# Patient Record
Sex: Female | Born: 1945 | Race: White | Hispanic: No | Marital: Married | Smoking: Never smoker
Health system: Southern US, Community
[De-identification: ages and names within clinical notes are randomized; demographics above are authoritative.]

## PROBLEM LIST (undated history)

## (undated) DIAGNOSIS — Z9889 Other specified postprocedural states: Secondary | ICD-10-CM

## (undated) DIAGNOSIS — D72829 Elevated white blood cell count, unspecified: Secondary | ICD-10-CM

## (undated) DIAGNOSIS — M797 Fibromyalgia: Secondary | ICD-10-CM

## (undated) DIAGNOSIS — I10 Essential (primary) hypertension: Secondary | ICD-10-CM

## (undated) DIAGNOSIS — E7800 Pure hypercholesterolemia, unspecified: Secondary | ICD-10-CM

## (undated) DIAGNOSIS — K219 Gastro-esophageal reflux disease without esophagitis: Secondary | ICD-10-CM

## (undated) DIAGNOSIS — E119 Type 2 diabetes mellitus without complications: Secondary | ICD-10-CM

## (undated) DIAGNOSIS — I639 Cerebral infarction, unspecified: Secondary | ICD-10-CM

## (undated) DIAGNOSIS — E669 Obesity, unspecified: Secondary | ICD-10-CM

## (undated) DIAGNOSIS — R112 Nausea with vomiting, unspecified: Secondary | ICD-10-CM

## (undated) HISTORY — PX: APPENDECTOMY: SHX54

## (undated) HISTORY — DX: Obesity, unspecified: E66.9

## (undated) HISTORY — DX: Cerebral infarction, unspecified: I63.9

## (undated) HISTORY — DX: Essential (primary) hypertension: I10

## (undated) HISTORY — PX: CHOLECYSTECTOMY: SHX55

## (undated) HISTORY — DX: Other specified postprocedural states: Z98.890

## (undated) HISTORY — DX: Pure hypercholesterolemia, unspecified: E78.00

## (undated) HISTORY — PX: BREAST CYST ASPIRATION: SHX578

## (undated) HISTORY — PX: DILATION AND CURETTAGE OF UTERUS: SHX78

## (undated) HISTORY — DX: Type 2 diabetes mellitus without complications: E11.9

## (undated) HISTORY — DX: Other specified postprocedural states: R11.2

## (undated) HISTORY — PX: ABDOMINAL HYSTERECTOMY: SHX81

## (undated) HISTORY — DX: Gastro-esophageal reflux disease without esophagitis: K21.9

## (undated) HISTORY — PX: OTHER SURGICAL HISTORY: SHX169

## (undated) HISTORY — PX: HERNIA REPAIR: SHX51

## (undated) HISTORY — DX: Elevated white blood cell count, unspecified: D72.829

## (undated) HISTORY — DX: Fibromyalgia: M79.7

---

## 2013-09-10 ENCOUNTER — Encounter (INDEPENDENT_AMBULATORY_CARE_PROVIDER_SITE_OTHER): Payer: Commercial Managed Care - HMO | Admitting: Ophthalmology

## 2013-09-10 DIAGNOSIS — E1165 Type 2 diabetes mellitus with hyperglycemia: Secondary | ICD-10-CM

## 2013-09-10 DIAGNOSIS — I10 Essential (primary) hypertension: Secondary | ICD-10-CM

## 2013-09-10 DIAGNOSIS — E11319 Type 2 diabetes mellitus with unspecified diabetic retinopathy without macular edema: Secondary | ICD-10-CM

## 2013-09-10 DIAGNOSIS — H43819 Vitreous degeneration, unspecified eye: Secondary | ICD-10-CM

## 2013-09-10 DIAGNOSIS — E1139 Type 2 diabetes mellitus with other diabetic ophthalmic complication: Secondary | ICD-10-CM

## 2013-09-10 DIAGNOSIS — H35039 Hypertensive retinopathy, unspecified eye: Secondary | ICD-10-CM

## 2013-09-10 DIAGNOSIS — H2510 Age-related nuclear cataract, unspecified eye: Secondary | ICD-10-CM

## 2013-11-11 HISTORY — PX: COLONOSCOPY: SHX174

## 2014-02-26 DIAGNOSIS — E1169 Type 2 diabetes mellitus with other specified complication: Secondary | ICD-10-CM | POA: Diagnosis not present

## 2014-02-26 DIAGNOSIS — I10 Essential (primary) hypertension: Secondary | ICD-10-CM | POA: Diagnosis not present

## 2014-02-26 DIAGNOSIS — E1322 Other specified diabetes mellitus with diabetic chronic kidney disease: Secondary | ICD-10-CM | POA: Diagnosis not present

## 2014-03-05 DIAGNOSIS — I10 Essential (primary) hypertension: Secondary | ICD-10-CM | POA: Diagnosis not present

## 2014-03-05 DIAGNOSIS — N181 Chronic kidney disease, stage 1: Secondary | ICD-10-CM | POA: Diagnosis not present

## 2014-03-05 DIAGNOSIS — E1169 Type 2 diabetes mellitus with other specified complication: Secondary | ICD-10-CM | POA: Diagnosis not present

## 2014-03-05 DIAGNOSIS — E1122 Type 2 diabetes mellitus with diabetic chronic kidney disease: Secondary | ICD-10-CM | POA: Diagnosis not present

## 2014-04-15 DIAGNOSIS — E119 Type 2 diabetes mellitus without complications: Secondary | ICD-10-CM | POA: Diagnosis not present

## 2014-04-15 DIAGNOSIS — H52209 Unspecified astigmatism, unspecified eye: Secondary | ICD-10-CM | POA: Diagnosis not present

## 2014-04-15 DIAGNOSIS — H5210 Myopia, unspecified eye: Secondary | ICD-10-CM | POA: Diagnosis not present

## 2014-04-15 DIAGNOSIS — H5213 Myopia, bilateral: Secondary | ICD-10-CM | POA: Diagnosis not present

## 2014-04-15 DIAGNOSIS — H524 Presbyopia: Secondary | ICD-10-CM | POA: Diagnosis not present

## 2014-04-15 DIAGNOSIS — Z0100 Encounter for examination of eyes and vision without abnormal findings: Secondary | ICD-10-CM | POA: Diagnosis not present

## 2014-05-05 DIAGNOSIS — Z6841 Body Mass Index (BMI) 40.0 and over, adult: Secondary | ICD-10-CM | POA: Diagnosis not present

## 2014-05-05 DIAGNOSIS — R2232 Localized swelling, mass and lump, left upper limb: Secondary | ICD-10-CM | POA: Diagnosis not present

## 2014-05-05 DIAGNOSIS — M797 Fibromyalgia: Secondary | ICD-10-CM | POA: Diagnosis not present

## 2014-05-14 DIAGNOSIS — R2232 Localized swelling, mass and lump, left upper limb: Secondary | ICD-10-CM | POA: Diagnosis not present

## 2014-05-31 DIAGNOSIS — R2232 Localized swelling, mass and lump, left upper limb: Secondary | ICD-10-CM | POA: Diagnosis not present

## 2014-05-31 DIAGNOSIS — Z6841 Body Mass Index (BMI) 40.0 and over, adult: Secondary | ICD-10-CM | POA: Diagnosis not present

## 2014-07-01 DIAGNOSIS — I10 Essential (primary) hypertension: Secondary | ICD-10-CM | POA: Diagnosis not present

## 2014-07-01 DIAGNOSIS — N181 Chronic kidney disease, stage 1: Secondary | ICD-10-CM | POA: Diagnosis not present

## 2014-07-01 DIAGNOSIS — E1122 Type 2 diabetes mellitus with diabetic chronic kidney disease: Secondary | ICD-10-CM | POA: Diagnosis not present

## 2014-07-01 DIAGNOSIS — E1169 Type 2 diabetes mellitus with other specified complication: Secondary | ICD-10-CM | POA: Diagnosis not present

## 2014-07-08 DIAGNOSIS — N181 Chronic kidney disease, stage 1: Secondary | ICD-10-CM | POA: Diagnosis not present

## 2014-07-08 DIAGNOSIS — E1169 Type 2 diabetes mellitus with other specified complication: Secondary | ICD-10-CM | POA: Diagnosis not present

## 2014-07-08 DIAGNOSIS — E782 Mixed hyperlipidemia: Secondary | ICD-10-CM | POA: Diagnosis not present

## 2014-07-08 DIAGNOSIS — E1122 Type 2 diabetes mellitus with diabetic chronic kidney disease: Secondary | ICD-10-CM | POA: Diagnosis not present

## 2014-07-16 HISTORY — PX: GALLBLADDER SURGERY: SHX652

## 2014-08-06 DIAGNOSIS — E1122 Type 2 diabetes mellitus with diabetic chronic kidney disease: Secondary | ICD-10-CM | POA: Diagnosis not present

## 2014-08-06 DIAGNOSIS — N181 Chronic kidney disease, stage 1: Secondary | ICD-10-CM | POA: Diagnosis not present

## 2014-08-06 DIAGNOSIS — Z6841 Body Mass Index (BMI) 40.0 and over, adult: Secondary | ICD-10-CM | POA: Diagnosis not present

## 2014-09-22 DIAGNOSIS — N181 Chronic kidney disease, stage 1: Secondary | ICD-10-CM | POA: Diagnosis not present

## 2014-09-22 DIAGNOSIS — E1122 Type 2 diabetes mellitus with diabetic chronic kidney disease: Secondary | ICD-10-CM | POA: Diagnosis not present

## 2014-09-22 DIAGNOSIS — Z6841 Body Mass Index (BMI) 40.0 and over, adult: Secondary | ICD-10-CM | POA: Diagnosis not present

## 2014-10-07 DIAGNOSIS — N181 Chronic kidney disease, stage 1: Secondary | ICD-10-CM | POA: Diagnosis not present

## 2014-10-07 DIAGNOSIS — E1122 Type 2 diabetes mellitus with diabetic chronic kidney disease: Secondary | ICD-10-CM | POA: Diagnosis not present

## 2014-10-07 DIAGNOSIS — E1169 Type 2 diabetes mellitus with other specified complication: Secondary | ICD-10-CM | POA: Diagnosis not present

## 2014-10-07 DIAGNOSIS — E1159 Type 2 diabetes mellitus with other circulatory complications: Secondary | ICD-10-CM | POA: Diagnosis not present

## 2014-10-18 DIAGNOSIS — E1122 Type 2 diabetes mellitus with diabetic chronic kidney disease: Secondary | ICD-10-CM | POA: Diagnosis not present

## 2014-10-18 DIAGNOSIS — N181 Chronic kidney disease, stage 1: Secondary | ICD-10-CM | POA: Diagnosis not present

## 2014-10-18 DIAGNOSIS — E1140 Type 2 diabetes mellitus with diabetic neuropathy, unspecified: Secondary | ICD-10-CM | POA: Diagnosis not present

## 2014-10-18 DIAGNOSIS — E1159 Type 2 diabetes mellitus with other circulatory complications: Secondary | ICD-10-CM | POA: Diagnosis not present

## 2014-10-18 DIAGNOSIS — Z23 Encounter for immunization: Secondary | ICD-10-CM | POA: Diagnosis not present

## 2014-11-04 DIAGNOSIS — Z0000 Encounter for general adult medical examination without abnormal findings: Secondary | ICD-10-CM | POA: Diagnosis not present

## 2014-11-04 DIAGNOSIS — G8929 Other chronic pain: Secondary | ICD-10-CM | POA: Diagnosis not present

## 2014-11-04 DIAGNOSIS — Z1389 Encounter for screening for other disorder: Secondary | ICD-10-CM | POA: Diagnosis not present

## 2014-11-04 DIAGNOSIS — Z6841 Body Mass Index (BMI) 40.0 and over, adult: Secondary | ICD-10-CM | POA: Diagnosis not present

## 2014-11-04 DIAGNOSIS — Z139 Encounter for screening, unspecified: Secondary | ICD-10-CM | POA: Diagnosis not present

## 2014-11-04 DIAGNOSIS — R1011 Right upper quadrant pain: Secondary | ICD-10-CM | POA: Diagnosis not present

## 2014-11-10 DIAGNOSIS — K8080 Other cholelithiasis without obstruction: Secondary | ICD-10-CM | POA: Diagnosis not present

## 2014-11-10 DIAGNOSIS — R1011 Right upper quadrant pain: Secondary | ICD-10-CM | POA: Diagnosis not present

## 2014-11-10 DIAGNOSIS — K8020 Calculus of gallbladder without cholecystitis without obstruction: Secondary | ICD-10-CM | POA: Diagnosis not present

## 2014-11-11 DIAGNOSIS — Z6841 Body Mass Index (BMI) 40.0 and over, adult: Secondary | ICD-10-CM | POA: Diagnosis not present

## 2014-11-11 DIAGNOSIS — K8020 Calculus of gallbladder without cholecystitis without obstruction: Secondary | ICD-10-CM | POA: Diagnosis not present

## 2014-11-11 DIAGNOSIS — G8929 Other chronic pain: Secondary | ICD-10-CM | POA: Diagnosis not present

## 2014-11-11 DIAGNOSIS — R1011 Right upper quadrant pain: Secondary | ICD-10-CM | POA: Diagnosis not present

## 2014-11-23 DIAGNOSIS — R1011 Right upper quadrant pain: Secondary | ICD-10-CM | POA: Diagnosis not present

## 2014-11-23 DIAGNOSIS — K8010 Calculus of gallbladder with chronic cholecystitis without obstruction: Secondary | ICD-10-CM | POA: Diagnosis not present

## 2014-11-23 DIAGNOSIS — R1013 Epigastric pain: Secondary | ICD-10-CM | POA: Diagnosis not present

## 2014-11-23 DIAGNOSIS — R110 Nausea: Secondary | ICD-10-CM | POA: Diagnosis not present

## 2014-12-03 DIAGNOSIS — Z01810 Encounter for preprocedural cardiovascular examination: Secondary | ICD-10-CM | POA: Diagnosis not present

## 2014-12-03 DIAGNOSIS — Z01818 Encounter for other preprocedural examination: Secondary | ICD-10-CM | POA: Diagnosis not present

## 2014-12-06 DIAGNOSIS — Z7984 Long term (current) use of oral hypoglycemic drugs: Secondary | ICD-10-CM | POA: Diagnosis not present

## 2014-12-06 DIAGNOSIS — I10 Essential (primary) hypertension: Secondary | ICD-10-CM | POA: Diagnosis not present

## 2014-12-06 DIAGNOSIS — E785 Hyperlipidemia, unspecified: Secondary | ICD-10-CM | POA: Diagnosis not present

## 2014-12-06 DIAGNOSIS — K219 Gastro-esophageal reflux disease without esophagitis: Secondary | ICD-10-CM | POA: Diagnosis not present

## 2014-12-06 DIAGNOSIS — Z8673 Personal history of transient ischemic attack (TIA), and cerebral infarction without residual deficits: Secondary | ICD-10-CM | POA: Diagnosis not present

## 2014-12-06 DIAGNOSIS — Z9049 Acquired absence of other specified parts of digestive tract: Secondary | ICD-10-CM | POA: Diagnosis not present

## 2014-12-06 DIAGNOSIS — J45909 Unspecified asthma, uncomplicated: Secondary | ICD-10-CM | POA: Diagnosis not present

## 2014-12-06 DIAGNOSIS — E1140 Type 2 diabetes mellitus with diabetic neuropathy, unspecified: Secondary | ICD-10-CM | POA: Diagnosis not present

## 2014-12-06 DIAGNOSIS — K8020 Calculus of gallbladder without cholecystitis without obstruction: Secondary | ICD-10-CM | POA: Diagnosis not present

## 2014-12-06 DIAGNOSIS — K8010 Calculus of gallbladder with chronic cholecystitis without obstruction: Secondary | ICD-10-CM | POA: Diagnosis not present

## 2015-02-17 DIAGNOSIS — E1122 Type 2 diabetes mellitus with diabetic chronic kidney disease: Secondary | ICD-10-CM | POA: Diagnosis not present

## 2015-02-17 DIAGNOSIS — E1159 Type 2 diabetes mellitus with other circulatory complications: Secondary | ICD-10-CM | POA: Diagnosis not present

## 2015-02-17 DIAGNOSIS — N181 Chronic kidney disease, stage 1: Secondary | ICD-10-CM | POA: Diagnosis not present

## 2015-02-17 DIAGNOSIS — E1169 Type 2 diabetes mellitus with other specified complication: Secondary | ICD-10-CM | POA: Diagnosis not present

## 2015-02-24 DIAGNOSIS — E1159 Type 2 diabetes mellitus with other circulatory complications: Secondary | ICD-10-CM | POA: Diagnosis not present

## 2015-02-24 DIAGNOSIS — E1122 Type 2 diabetes mellitus with diabetic chronic kidney disease: Secondary | ICD-10-CM | POA: Diagnosis not present

## 2015-02-24 DIAGNOSIS — N181 Chronic kidney disease, stage 1: Secondary | ICD-10-CM | POA: Diagnosis not present

## 2015-02-24 DIAGNOSIS — I10 Essential (primary) hypertension: Secondary | ICD-10-CM | POA: Diagnosis not present

## 2015-06-29 DIAGNOSIS — E1159 Type 2 diabetes mellitus with other circulatory complications: Secondary | ICD-10-CM | POA: Diagnosis not present

## 2015-06-29 DIAGNOSIS — N181 Chronic kidney disease, stage 1: Secondary | ICD-10-CM | POA: Diagnosis not present

## 2015-06-29 DIAGNOSIS — E1169 Type 2 diabetes mellitus with other specified complication: Secondary | ICD-10-CM | POA: Diagnosis not present

## 2015-06-29 DIAGNOSIS — E1122 Type 2 diabetes mellitus with diabetic chronic kidney disease: Secondary | ICD-10-CM | POA: Diagnosis not present

## 2015-07-07 DIAGNOSIS — E1122 Type 2 diabetes mellitus with diabetic chronic kidney disease: Secondary | ICD-10-CM | POA: Diagnosis not present

## 2015-07-07 DIAGNOSIS — N181 Chronic kidney disease, stage 1: Secondary | ICD-10-CM | POA: Diagnosis not present

## 2015-07-07 DIAGNOSIS — I10 Essential (primary) hypertension: Secondary | ICD-10-CM | POA: Diagnosis not present

## 2015-07-07 DIAGNOSIS — E1159 Type 2 diabetes mellitus with other circulatory complications: Secondary | ICD-10-CM | POA: Diagnosis not present

## 2015-07-08 DIAGNOSIS — Z1211 Encounter for screening for malignant neoplasm of colon: Secondary | ICD-10-CM | POA: Diagnosis not present

## 2015-07-08 DIAGNOSIS — Z0000 Encounter for general adult medical examination without abnormal findings: Secondary | ICD-10-CM | POA: Diagnosis not present

## 2015-07-08 DIAGNOSIS — Z01419 Encounter for gynecological examination (general) (routine) without abnormal findings: Secondary | ICD-10-CM | POA: Diagnosis not present

## 2015-07-08 DIAGNOSIS — N814 Uterovaginal prolapse, unspecified: Secondary | ICD-10-CM | POA: Diagnosis not present

## 2015-07-15 DIAGNOSIS — M8588 Other specified disorders of bone density and structure, other site: Secondary | ICD-10-CM | POA: Diagnosis not present

## 2015-07-15 DIAGNOSIS — Z1231 Encounter for screening mammogram for malignant neoplasm of breast: Secondary | ICD-10-CM | POA: Diagnosis not present

## 2015-07-15 DIAGNOSIS — M8580 Other specified disorders of bone density and structure, unspecified site: Secondary | ICD-10-CM | POA: Diagnosis not present

## 2015-08-25 DIAGNOSIS — Z6841 Body Mass Index (BMI) 40.0 and over, adult: Secondary | ICD-10-CM | POA: Diagnosis not present

## 2015-08-25 DIAGNOSIS — M8580 Other specified disorders of bone density and structure, unspecified site: Secondary | ICD-10-CM | POA: Diagnosis not present

## 2015-09-22 DIAGNOSIS — Z6841 Body Mass Index (BMI) 40.0 and over, adult: Secondary | ICD-10-CM | POA: Diagnosis not present

## 2015-09-22 DIAGNOSIS — R42 Dizziness and giddiness: Secondary | ICD-10-CM | POA: Diagnosis not present

## 2015-09-22 DIAGNOSIS — R109 Unspecified abdominal pain: Secondary | ICD-10-CM | POA: Diagnosis not present

## 2015-11-03 DIAGNOSIS — E1169 Type 2 diabetes mellitus with other specified complication: Secondary | ICD-10-CM | POA: Diagnosis not present

## 2015-11-03 DIAGNOSIS — N181 Chronic kidney disease, stage 1: Secondary | ICD-10-CM | POA: Diagnosis not present

## 2015-11-03 DIAGNOSIS — E1159 Type 2 diabetes mellitus with other circulatory complications: Secondary | ICD-10-CM | POA: Diagnosis not present

## 2015-11-03 DIAGNOSIS — E1122 Type 2 diabetes mellitus with diabetic chronic kidney disease: Secondary | ICD-10-CM | POA: Diagnosis not present

## 2015-11-09 DIAGNOSIS — Z139 Encounter for screening, unspecified: Secondary | ICD-10-CM | POA: Diagnosis not present

## 2015-11-09 DIAGNOSIS — Z0000 Encounter for general adult medical examination without abnormal findings: Secondary | ICD-10-CM | POA: Diagnosis not present

## 2015-11-09 DIAGNOSIS — E1122 Type 2 diabetes mellitus with diabetic chronic kidney disease: Secondary | ICD-10-CM | POA: Diagnosis not present

## 2015-11-09 DIAGNOSIS — Z1389 Encounter for screening for other disorder: Secondary | ICD-10-CM | POA: Diagnosis not present

## 2015-11-09 DIAGNOSIS — N181 Chronic kidney disease, stage 1: Secondary | ICD-10-CM | POA: Diagnosis not present

## 2015-11-17 DIAGNOSIS — N181 Chronic kidney disease, stage 1: Secondary | ICD-10-CM | POA: Diagnosis not present

## 2015-11-17 DIAGNOSIS — E1159 Type 2 diabetes mellitus with other circulatory complications: Secondary | ICD-10-CM | POA: Diagnosis not present

## 2015-11-17 DIAGNOSIS — E1122 Type 2 diabetes mellitus with diabetic chronic kidney disease: Secondary | ICD-10-CM | POA: Diagnosis not present

## 2015-11-17 DIAGNOSIS — I10 Essential (primary) hypertension: Secondary | ICD-10-CM | POA: Diagnosis not present

## 2016-02-08 DIAGNOSIS — S0990XA Unspecified injury of head, initial encounter: Secondary | ICD-10-CM | POA: Diagnosis not present

## 2016-02-08 DIAGNOSIS — S39012A Strain of muscle, fascia and tendon of lower back, initial encounter: Secondary | ICD-10-CM | POA: Diagnosis not present

## 2016-02-08 DIAGNOSIS — E119 Type 2 diabetes mellitus without complications: Secondary | ICD-10-CM | POA: Diagnosis not present

## 2016-02-08 DIAGNOSIS — S199XXA Unspecified injury of neck, initial encounter: Secondary | ICD-10-CM | POA: Diagnosis not present

## 2016-02-08 DIAGNOSIS — M542 Cervicalgia: Secondary | ICD-10-CM | POA: Diagnosis not present

## 2016-02-08 DIAGNOSIS — S90111A Contusion of right great toe without damage to nail, initial encounter: Secondary | ICD-10-CM | POA: Diagnosis not present

## 2016-02-08 DIAGNOSIS — S0101XA Laceration without foreign body of scalp, initial encounter: Secondary | ICD-10-CM | POA: Diagnosis not present

## 2016-02-08 DIAGNOSIS — M79671 Pain in right foot: Secondary | ICD-10-CM | POA: Diagnosis not present

## 2016-02-08 DIAGNOSIS — R51 Headache: Secondary | ICD-10-CM | POA: Diagnosis not present

## 2016-02-08 DIAGNOSIS — G4489 Other headache syndrome: Secondary | ICD-10-CM | POA: Diagnosis not present

## 2016-02-08 DIAGNOSIS — M545 Low back pain: Secondary | ICD-10-CM | POA: Diagnosis not present

## 2016-02-08 DIAGNOSIS — S161XXA Strain of muscle, fascia and tendon at neck level, initial encounter: Secondary | ICD-10-CM | POA: Diagnosis not present

## 2016-02-08 DIAGNOSIS — S99921A Unspecified injury of right foot, initial encounter: Secondary | ICD-10-CM | POA: Diagnosis not present

## 2016-02-14 DIAGNOSIS — S0101XA Laceration without foreign body of scalp, initial encounter: Secondary | ICD-10-CM | POA: Diagnosis not present

## 2016-02-14 DIAGNOSIS — Z23 Encounter for immunization: Secondary | ICD-10-CM | POA: Diagnosis not present

## 2016-02-14 DIAGNOSIS — S060X0D Concussion without loss of consciousness, subsequent encounter: Secondary | ICD-10-CM | POA: Diagnosis not present

## 2016-02-17 DIAGNOSIS — E1169 Type 2 diabetes mellitus with other specified complication: Secondary | ICD-10-CM | POA: Diagnosis not present

## 2016-02-17 DIAGNOSIS — E1122 Type 2 diabetes mellitus with diabetic chronic kidney disease: Secondary | ICD-10-CM | POA: Diagnosis not present

## 2016-02-17 DIAGNOSIS — E1159 Type 2 diabetes mellitus with other circulatory complications: Secondary | ICD-10-CM | POA: Diagnosis not present

## 2016-02-24 DIAGNOSIS — N181 Chronic kidney disease, stage 1: Secondary | ICD-10-CM | POA: Diagnosis not present

## 2016-02-24 DIAGNOSIS — E1159 Type 2 diabetes mellitus with other circulatory complications: Secondary | ICD-10-CM | POA: Diagnosis not present

## 2016-02-24 DIAGNOSIS — E785 Hyperlipidemia, unspecified: Secondary | ICD-10-CM | POA: Diagnosis not present

## 2016-02-24 DIAGNOSIS — E1122 Type 2 diabetes mellitus with diabetic chronic kidney disease: Secondary | ICD-10-CM | POA: Diagnosis not present

## 2016-03-23 DIAGNOSIS — Z6841 Body Mass Index (BMI) 40.0 and over, adult: Secondary | ICD-10-CM | POA: Diagnosis not present

## 2016-03-23 DIAGNOSIS — N181 Chronic kidney disease, stage 1: Secondary | ICD-10-CM | POA: Diagnosis not present

## 2016-03-23 DIAGNOSIS — E1122 Type 2 diabetes mellitus with diabetic chronic kidney disease: Secondary | ICD-10-CM | POA: Diagnosis not present

## 2016-04-18 DIAGNOSIS — R195 Other fecal abnormalities: Secondary | ICD-10-CM | POA: Diagnosis not present

## 2016-04-18 DIAGNOSIS — R143 Flatulence: Secondary | ICD-10-CM | POA: Diagnosis not present

## 2016-04-18 DIAGNOSIS — Z6841 Body Mass Index (BMI) 40.0 and over, adult: Secondary | ICD-10-CM | POA: Diagnosis not present

## 2016-06-15 DIAGNOSIS — E1122 Type 2 diabetes mellitus with diabetic chronic kidney disease: Secondary | ICD-10-CM | POA: Diagnosis not present

## 2016-06-15 DIAGNOSIS — E1159 Type 2 diabetes mellitus with other circulatory complications: Secondary | ICD-10-CM | POA: Diagnosis not present

## 2016-06-15 DIAGNOSIS — E1169 Type 2 diabetes mellitus with other specified complication: Secondary | ICD-10-CM | POA: Diagnosis not present

## 2016-06-21 DIAGNOSIS — Z7189 Other specified counseling: Secondary | ICD-10-CM | POA: Diagnosis not present

## 2016-06-21 DIAGNOSIS — F3160 Bipolar disorder, current episode mixed, unspecified: Secondary | ICD-10-CM | POA: Diagnosis not present

## 2016-06-21 DIAGNOSIS — Z789 Other specified health status: Secondary | ICD-10-CM | POA: Diagnosis not present

## 2016-06-21 DIAGNOSIS — E119 Type 2 diabetes mellitus without complications: Secondary | ICD-10-CM | POA: Diagnosis not present

## 2016-06-21 DIAGNOSIS — N181 Chronic kidney disease, stage 1: Secondary | ICD-10-CM | POA: Diagnosis not present

## 2016-06-21 DIAGNOSIS — M797 Fibromyalgia: Secondary | ICD-10-CM | POA: Diagnosis not present

## 2016-06-21 DIAGNOSIS — E1159 Type 2 diabetes mellitus with other circulatory complications: Secondary | ICD-10-CM | POA: Diagnosis not present

## 2016-06-21 DIAGNOSIS — I10 Essential (primary) hypertension: Secondary | ICD-10-CM | POA: Diagnosis not present

## 2016-06-21 DIAGNOSIS — E1122 Type 2 diabetes mellitus with diabetic chronic kidney disease: Secondary | ICD-10-CM | POA: Diagnosis not present

## 2016-07-06 DIAGNOSIS — H903 Sensorineural hearing loss, bilateral: Secondary | ICD-10-CM | POA: Diagnosis not present

## 2016-10-23 DIAGNOSIS — E1169 Type 2 diabetes mellitus with other specified complication: Secondary | ICD-10-CM | POA: Diagnosis not present

## 2016-10-23 DIAGNOSIS — E1122 Type 2 diabetes mellitus with diabetic chronic kidney disease: Secondary | ICD-10-CM | POA: Diagnosis not present

## 2016-10-23 DIAGNOSIS — E1159 Type 2 diabetes mellitus with other circulatory complications: Secondary | ICD-10-CM | POA: Diagnosis not present

## 2016-10-29 DIAGNOSIS — Z23 Encounter for immunization: Secondary | ICD-10-CM | POA: Diagnosis not present

## 2016-10-29 DIAGNOSIS — E1122 Type 2 diabetes mellitus with diabetic chronic kidney disease: Secondary | ICD-10-CM | POA: Diagnosis not present

## 2016-10-29 DIAGNOSIS — I10 Essential (primary) hypertension: Secondary | ICD-10-CM | POA: Diagnosis not present

## 2016-10-29 DIAGNOSIS — E1159 Type 2 diabetes mellitus with other circulatory complications: Secondary | ICD-10-CM | POA: Diagnosis not present

## 2016-10-29 DIAGNOSIS — N181 Chronic kidney disease, stage 1: Secondary | ICD-10-CM | POA: Diagnosis not present

## 2016-11-07 DIAGNOSIS — Z1231 Encounter for screening mammogram for malignant neoplasm of breast: Secondary | ICD-10-CM | POA: Diagnosis not present

## 2016-11-08 DIAGNOSIS — E1122 Type 2 diabetes mellitus with diabetic chronic kidney disease: Secondary | ICD-10-CM | POA: Diagnosis not present

## 2016-11-08 DIAGNOSIS — N181 Chronic kidney disease, stage 1: Secondary | ICD-10-CM | POA: Diagnosis not present

## 2016-11-29 DIAGNOSIS — N181 Chronic kidney disease, stage 1: Secondary | ICD-10-CM | POA: Diagnosis not present

## 2016-11-29 DIAGNOSIS — E1122 Type 2 diabetes mellitus with diabetic chronic kidney disease: Secondary | ICD-10-CM | POA: Diagnosis not present

## 2016-12-18 DIAGNOSIS — Z6841 Body Mass Index (BMI) 40.0 and over, adult: Secondary | ICD-10-CM | POA: Diagnosis not present

## 2016-12-18 DIAGNOSIS — E1140 Type 2 diabetes mellitus with diabetic neuropathy, unspecified: Secondary | ICD-10-CM | POA: Diagnosis not present

## 2016-12-18 DIAGNOSIS — H60391 Other infective otitis externa, right ear: Secondary | ICD-10-CM | POA: Diagnosis not present

## 2017-02-09 DIAGNOSIS — K591 Functional diarrhea: Secondary | ICD-10-CM | POA: Diagnosis not present

## 2017-02-09 DIAGNOSIS — R300 Dysuria: Secondary | ICD-10-CM | POA: Diagnosis not present

## 2017-02-10 DIAGNOSIS — R197 Diarrhea, unspecified: Secondary | ICD-10-CM | POA: Diagnosis not present

## 2017-02-20 DIAGNOSIS — E1122 Type 2 diabetes mellitus with diabetic chronic kidney disease: Secondary | ICD-10-CM | POA: Diagnosis not present

## 2017-02-20 DIAGNOSIS — Z789 Other specified health status: Secondary | ICD-10-CM | POA: Diagnosis not present

## 2017-02-20 DIAGNOSIS — E1159 Type 2 diabetes mellitus with other circulatory complications: Secondary | ICD-10-CM | POA: Diagnosis not present

## 2017-02-20 DIAGNOSIS — N181 Chronic kidney disease, stage 1: Secondary | ICD-10-CM | POA: Diagnosis not present

## 2017-02-21 DIAGNOSIS — E1122 Type 2 diabetes mellitus with diabetic chronic kidney disease: Secondary | ICD-10-CM | POA: Diagnosis not present

## 2017-02-21 DIAGNOSIS — E1169 Type 2 diabetes mellitus with other specified complication: Secondary | ICD-10-CM | POA: Diagnosis not present

## 2017-02-21 DIAGNOSIS — E1159 Type 2 diabetes mellitus with other circulatory complications: Secondary | ICD-10-CM | POA: Diagnosis not present

## 2017-03-01 DIAGNOSIS — E1129 Type 2 diabetes mellitus with other diabetic kidney complication: Secondary | ICD-10-CM | POA: Diagnosis not present

## 2017-03-01 DIAGNOSIS — I10 Essential (primary) hypertension: Secondary | ICD-10-CM | POA: Diagnosis not present

## 2017-03-01 DIAGNOSIS — E1169 Type 2 diabetes mellitus with other specified complication: Secondary | ICD-10-CM | POA: Diagnosis not present

## 2017-03-01 DIAGNOSIS — E1159 Type 2 diabetes mellitus with other circulatory complications: Secondary | ICD-10-CM | POA: Diagnosis not present

## 2017-03-01 DIAGNOSIS — Z1331 Encounter for screening for depression: Secondary | ICD-10-CM | POA: Diagnosis not present

## 2017-03-01 DIAGNOSIS — Z0000 Encounter for general adult medical examination without abnormal findings: Secondary | ICD-10-CM | POA: Diagnosis not present

## 2017-03-02 DIAGNOSIS — R51 Headache: Secondary | ICD-10-CM | POA: Diagnosis not present

## 2017-03-02 DIAGNOSIS — E119 Type 2 diabetes mellitus without complications: Secondary | ICD-10-CM | POA: Diagnosis not present

## 2017-03-02 DIAGNOSIS — Z7984 Long term (current) use of oral hypoglycemic drugs: Secondary | ICD-10-CM | POA: Diagnosis not present

## 2017-03-02 DIAGNOSIS — I252 Old myocardial infarction: Secondary | ICD-10-CM | POA: Diagnosis not present

## 2017-03-02 DIAGNOSIS — M797 Fibromyalgia: Secondary | ICD-10-CM | POA: Diagnosis not present

## 2017-03-02 DIAGNOSIS — R42 Dizziness and giddiness: Secondary | ICD-10-CM | POA: Diagnosis not present

## 2017-03-02 DIAGNOSIS — Z8673 Personal history of transient ischemic attack (TIA), and cerebral infarction without residual deficits: Secondary | ICD-10-CM | POA: Diagnosis not present

## 2017-03-02 DIAGNOSIS — I10 Essential (primary) hypertension: Secondary | ICD-10-CM | POA: Diagnosis not present

## 2017-03-02 DIAGNOSIS — Z7902 Long term (current) use of antithrombotics/antiplatelets: Secondary | ICD-10-CM | POA: Diagnosis not present

## 2017-03-03 DIAGNOSIS — R42 Dizziness and giddiness: Secondary | ICD-10-CM | POA: Diagnosis not present

## 2017-03-08 DIAGNOSIS — N179 Acute kidney failure, unspecified: Secondary | ICD-10-CM | POA: Diagnosis not present

## 2017-03-21 DIAGNOSIS — E1122 Type 2 diabetes mellitus with diabetic chronic kidney disease: Secondary | ICD-10-CM | POA: Diagnosis not present

## 2017-03-21 DIAGNOSIS — N181 Chronic kidney disease, stage 1: Secondary | ICD-10-CM | POA: Diagnosis not present

## 2017-04-25 DIAGNOSIS — Z6841 Body Mass Index (BMI) 40.0 and over, adult: Secondary | ICD-10-CM | POA: Diagnosis not present

## 2017-04-25 DIAGNOSIS — E1129 Type 2 diabetes mellitus with other diabetic kidney complication: Secondary | ICD-10-CM | POA: Diagnosis not present

## 2017-06-17 DIAGNOSIS — M79604 Pain in right leg: Secondary | ICD-10-CM | POA: Diagnosis not present

## 2017-06-17 DIAGNOSIS — Z6841 Body Mass Index (BMI) 40.0 and over, adult: Secondary | ICD-10-CM | POA: Diagnosis not present

## 2017-06-17 DIAGNOSIS — Z9189 Other specified personal risk factors, not elsewhere classified: Secondary | ICD-10-CM | POA: Diagnosis not present

## 2017-06-18 DIAGNOSIS — M79661 Pain in right lower leg: Secondary | ICD-10-CM | POA: Diagnosis not present

## 2017-06-18 DIAGNOSIS — M79604 Pain in right leg: Secondary | ICD-10-CM | POA: Diagnosis not present

## 2017-06-24 DIAGNOSIS — M79604 Pain in right leg: Secondary | ICD-10-CM | POA: Diagnosis not present

## 2017-06-24 DIAGNOSIS — Z6841 Body Mass Index (BMI) 40.0 and over, adult: Secondary | ICD-10-CM | POA: Diagnosis not present

## 2017-06-24 DIAGNOSIS — Z139 Encounter for screening, unspecified: Secondary | ICD-10-CM | POA: Diagnosis not present

## 2017-06-27 DIAGNOSIS — H524 Presbyopia: Secondary | ICD-10-CM | POA: Diagnosis not present

## 2017-06-28 DIAGNOSIS — E1122 Type 2 diabetes mellitus with diabetic chronic kidney disease: Secondary | ICD-10-CM | POA: Diagnosis not present

## 2017-06-28 DIAGNOSIS — E785 Hyperlipidemia, unspecified: Secondary | ICD-10-CM | POA: Diagnosis not present

## 2017-06-28 DIAGNOSIS — E1159 Type 2 diabetes mellitus with other circulatory complications: Secondary | ICD-10-CM | POA: Diagnosis not present

## 2017-07-03 DIAGNOSIS — E1122 Type 2 diabetes mellitus with diabetic chronic kidney disease: Secondary | ICD-10-CM | POA: Diagnosis not present

## 2017-07-03 DIAGNOSIS — E1140 Type 2 diabetes mellitus with diabetic neuropathy, unspecified: Secondary | ICD-10-CM | POA: Diagnosis not present

## 2017-07-03 DIAGNOSIS — N181 Chronic kidney disease, stage 1: Secondary | ICD-10-CM | POA: Diagnosis not present

## 2017-07-03 DIAGNOSIS — E1159 Type 2 diabetes mellitus with other circulatory complications: Secondary | ICD-10-CM | POA: Diagnosis not present

## 2017-07-03 DIAGNOSIS — E2839 Other primary ovarian failure: Secondary | ICD-10-CM | POA: Diagnosis not present

## 2017-07-03 DIAGNOSIS — M85851 Other specified disorders of bone density and structure, right thigh: Secondary | ICD-10-CM | POA: Diagnosis not present

## 2017-08-14 DIAGNOSIS — E1140 Type 2 diabetes mellitus with diabetic neuropathy, unspecified: Secondary | ICD-10-CM | POA: Diagnosis not present

## 2017-08-14 DIAGNOSIS — E1122 Type 2 diabetes mellitus with diabetic chronic kidney disease: Secondary | ICD-10-CM | POA: Diagnosis not present

## 2017-08-14 DIAGNOSIS — E1159 Type 2 diabetes mellitus with other circulatory complications: Secondary | ICD-10-CM | POA: Diagnosis not present

## 2017-08-14 DIAGNOSIS — N181 Chronic kidney disease, stage 1: Secondary | ICD-10-CM | POA: Diagnosis not present

## 2017-09-13 DIAGNOSIS — E1159 Type 2 diabetes mellitus with other circulatory complications: Secondary | ICD-10-CM | POA: Diagnosis not present

## 2017-09-13 DIAGNOSIS — E1122 Type 2 diabetes mellitus with diabetic chronic kidney disease: Secondary | ICD-10-CM | POA: Diagnosis not present

## 2017-09-13 DIAGNOSIS — N181 Chronic kidney disease, stage 1: Secondary | ICD-10-CM | POA: Diagnosis not present

## 2017-09-13 DIAGNOSIS — E1140 Type 2 diabetes mellitus with diabetic neuropathy, unspecified: Secondary | ICD-10-CM | POA: Diagnosis not present

## 2017-10-04 DIAGNOSIS — R1012 Left upper quadrant pain: Secondary | ICD-10-CM | POA: Diagnosis not present

## 2017-10-04 DIAGNOSIS — R1032 Left lower quadrant pain: Secondary | ICD-10-CM | POA: Diagnosis not present

## 2017-10-04 DIAGNOSIS — R197 Diarrhea, unspecified: Secondary | ICD-10-CM | POA: Diagnosis not present

## 2017-10-04 DIAGNOSIS — Z139 Encounter for screening, unspecified: Secondary | ICD-10-CM | POA: Diagnosis not present

## 2017-10-14 DIAGNOSIS — E1122 Type 2 diabetes mellitus with diabetic chronic kidney disease: Secondary | ICD-10-CM | POA: Diagnosis not present

## 2017-10-14 DIAGNOSIS — E1159 Type 2 diabetes mellitus with other circulatory complications: Secondary | ICD-10-CM | POA: Diagnosis not present

## 2017-10-14 DIAGNOSIS — E1140 Type 2 diabetes mellitus with diabetic neuropathy, unspecified: Secondary | ICD-10-CM | POA: Diagnosis not present

## 2017-10-14 DIAGNOSIS — N181 Chronic kidney disease, stage 1: Secondary | ICD-10-CM | POA: Diagnosis not present

## 2017-10-18 DIAGNOSIS — Z6841 Body Mass Index (BMI) 40.0 and over, adult: Secondary | ICD-10-CM | POA: Diagnosis not present

## 2017-10-18 DIAGNOSIS — Z23 Encounter for immunization: Secondary | ICD-10-CM | POA: Diagnosis not present

## 2017-10-18 DIAGNOSIS — R1012 Left upper quadrant pain: Secondary | ICD-10-CM | POA: Diagnosis not present

## 2017-10-18 DIAGNOSIS — R1032 Left lower quadrant pain: Secondary | ICD-10-CM | POA: Diagnosis not present

## 2017-10-19 DIAGNOSIS — S6291XA Unspecified fracture of right wrist and hand, initial encounter for closed fracture: Secondary | ICD-10-CM | POA: Diagnosis not present

## 2017-10-21 DIAGNOSIS — M25531 Pain in right wrist: Secondary | ICD-10-CM | POA: Diagnosis not present

## 2017-10-28 DIAGNOSIS — S63501A Unspecified sprain of right wrist, initial encounter: Secondary | ICD-10-CM | POA: Diagnosis not present

## 2017-10-30 DIAGNOSIS — E785 Hyperlipidemia, unspecified: Secondary | ICD-10-CM | POA: Diagnosis not present

## 2017-10-30 DIAGNOSIS — E1122 Type 2 diabetes mellitus with diabetic chronic kidney disease: Secondary | ICD-10-CM | POA: Diagnosis not present

## 2017-10-30 DIAGNOSIS — E1159 Type 2 diabetes mellitus with other circulatory complications: Secondary | ICD-10-CM | POA: Diagnosis not present

## 2017-11-06 DIAGNOSIS — E1129 Type 2 diabetes mellitus with other diabetic kidney complication: Secondary | ICD-10-CM | POA: Diagnosis not present

## 2017-11-06 DIAGNOSIS — E1159 Type 2 diabetes mellitus with other circulatory complications: Secondary | ICD-10-CM | POA: Diagnosis not present

## 2017-11-06 DIAGNOSIS — I10 Essential (primary) hypertension: Secondary | ICD-10-CM | POA: Diagnosis not present

## 2017-11-06 DIAGNOSIS — E785 Hyperlipidemia, unspecified: Secondary | ICD-10-CM | POA: Diagnosis not present

## 2017-11-14 DIAGNOSIS — E1129 Type 2 diabetes mellitus with other diabetic kidney complication: Secondary | ICD-10-CM | POA: Diagnosis not present

## 2017-11-14 DIAGNOSIS — I10 Essential (primary) hypertension: Secondary | ICD-10-CM | POA: Diagnosis not present

## 2017-11-14 DIAGNOSIS — E1159 Type 2 diabetes mellitus with other circulatory complications: Secondary | ICD-10-CM | POA: Diagnosis not present

## 2017-11-14 DIAGNOSIS — E785 Hyperlipidemia, unspecified: Secondary | ICD-10-CM | POA: Diagnosis not present

## 2017-11-19 DIAGNOSIS — Z6841 Body Mass Index (BMI) 40.0 and over, adult: Secondary | ICD-10-CM | POA: Diagnosis not present

## 2017-11-26 DIAGNOSIS — S63501A Unspecified sprain of right wrist, initial encounter: Secondary | ICD-10-CM | POA: Diagnosis not present

## 2017-11-26 DIAGNOSIS — Z6841 Body Mass Index (BMI) 40.0 and over, adult: Secondary | ICD-10-CM | POA: Diagnosis not present

## 2017-12-10 DIAGNOSIS — Z6841 Body Mass Index (BMI) 40.0 and over, adult: Secondary | ICD-10-CM | POA: Diagnosis not present

## 2017-12-13 DIAGNOSIS — N816 Rectocele: Secondary | ICD-10-CM | POA: Diagnosis not present

## 2017-12-13 DIAGNOSIS — N814 Uterovaginal prolapse, unspecified: Secondary | ICD-10-CM | POA: Diagnosis not present

## 2017-12-14 DIAGNOSIS — E1159 Type 2 diabetes mellitus with other circulatory complications: Secondary | ICD-10-CM | POA: Diagnosis not present

## 2017-12-14 DIAGNOSIS — E1129 Type 2 diabetes mellitus with other diabetic kidney complication: Secondary | ICD-10-CM | POA: Diagnosis not present

## 2017-12-14 DIAGNOSIS — I10 Essential (primary) hypertension: Secondary | ICD-10-CM | POA: Diagnosis not present

## 2017-12-14 DIAGNOSIS — E785 Hyperlipidemia, unspecified: Secondary | ICD-10-CM | POA: Diagnosis not present

## 2017-12-24 DIAGNOSIS — Z6841 Body Mass Index (BMI) 40.0 and over, adult: Secondary | ICD-10-CM | POA: Diagnosis not present

## 2017-12-26 DIAGNOSIS — J0100 Acute maxillary sinusitis, unspecified: Secondary | ICD-10-CM | POA: Diagnosis not present

## 2017-12-26 DIAGNOSIS — I160 Hypertensive urgency: Secondary | ICD-10-CM | POA: Diagnosis not present

## 2017-12-26 DIAGNOSIS — Z713 Dietary counseling and surveillance: Secondary | ICD-10-CM | POA: Diagnosis not present

## 2018-01-01 DIAGNOSIS — E1122 Type 2 diabetes mellitus with diabetic chronic kidney disease: Secondary | ICD-10-CM | POA: Diagnosis not present

## 2018-01-01 DIAGNOSIS — E1159 Type 2 diabetes mellitus with other circulatory complications: Secondary | ICD-10-CM | POA: Diagnosis not present

## 2018-01-01 DIAGNOSIS — N181 Chronic kidney disease, stage 1: Secondary | ICD-10-CM | POA: Diagnosis not present

## 2018-01-01 DIAGNOSIS — N814 Uterovaginal prolapse, unspecified: Secondary | ICD-10-CM | POA: Diagnosis not present

## 2018-01-14 DIAGNOSIS — N181 Chronic kidney disease, stage 1: Secondary | ICD-10-CM | POA: Diagnosis not present

## 2018-01-14 DIAGNOSIS — Z6841 Body Mass Index (BMI) 40.0 and over, adult: Secondary | ICD-10-CM | POA: Diagnosis not present

## 2018-01-14 DIAGNOSIS — I10 Essential (primary) hypertension: Secondary | ICD-10-CM | POA: Diagnosis not present

## 2018-01-14 DIAGNOSIS — E1159 Type 2 diabetes mellitus with other circulatory complications: Secondary | ICD-10-CM | POA: Diagnosis not present

## 2018-01-14 DIAGNOSIS — E1122 Type 2 diabetes mellitus with diabetic chronic kidney disease: Secondary | ICD-10-CM | POA: Diagnosis not present

## 2018-01-17 DIAGNOSIS — N816 Rectocele: Secondary | ICD-10-CM

## 2018-01-17 DIAGNOSIS — N814 Uterovaginal prolapse, unspecified: Secondary | ICD-10-CM | POA: Insufficient documentation

## 2018-01-17 HISTORY — DX: Uterovaginal prolapse, unspecified: N81.4

## 2018-01-17 HISTORY — DX: Rectocele: N81.6

## 2018-01-27 DIAGNOSIS — E1159 Type 2 diabetes mellitus with other circulatory complications: Secondary | ICD-10-CM | POA: Diagnosis not present

## 2018-01-27 DIAGNOSIS — H8110 Benign paroxysmal vertigo, unspecified ear: Secondary | ICD-10-CM | POA: Diagnosis not present

## 2018-01-27 DIAGNOSIS — I10 Essential (primary) hypertension: Secondary | ICD-10-CM | POA: Diagnosis not present

## 2018-02-04 DIAGNOSIS — N814 Uterovaginal prolapse, unspecified: Secondary | ICD-10-CM | POA: Diagnosis not present

## 2018-02-04 DIAGNOSIS — N816 Rectocele: Secondary | ICD-10-CM | POA: Diagnosis not present

## 2018-02-04 DIAGNOSIS — N8110 Cystocele, unspecified: Secondary | ICD-10-CM | POA: Diagnosis not present

## 2018-02-04 DIAGNOSIS — R011 Cardiac murmur, unspecified: Secondary | ICD-10-CM | POA: Diagnosis not present

## 2018-02-04 DIAGNOSIS — I10 Essential (primary) hypertension: Secondary | ICD-10-CM | POA: Diagnosis not present

## 2018-02-04 DIAGNOSIS — E119 Type 2 diabetes mellitus without complications: Secondary | ICD-10-CM | POA: Diagnosis not present

## 2018-02-04 DIAGNOSIS — N800 Endometriosis of uterus: Secondary | ICD-10-CM | POA: Diagnosis not present

## 2018-02-04 DIAGNOSIS — Z79899 Other long term (current) drug therapy: Secondary | ICD-10-CM | POA: Diagnosis not present

## 2018-02-04 DIAGNOSIS — Z8673 Personal history of transient ischemic attack (TIA), and cerebral infarction without residual deficits: Secondary | ICD-10-CM | POA: Diagnosis not present

## 2018-02-05 DIAGNOSIS — N816 Rectocele: Secondary | ICD-10-CM | POA: Diagnosis not present

## 2018-02-05 DIAGNOSIS — I10 Essential (primary) hypertension: Secondary | ICD-10-CM | POA: Diagnosis not present

## 2018-02-05 DIAGNOSIS — N800 Endometriosis of uterus: Secondary | ICD-10-CM | POA: Diagnosis not present

## 2018-02-05 DIAGNOSIS — Z79899 Other long term (current) drug therapy: Secondary | ICD-10-CM | POA: Diagnosis not present

## 2018-02-05 DIAGNOSIS — Z8673 Personal history of transient ischemic attack (TIA), and cerebral infarction without residual deficits: Secondary | ICD-10-CM | POA: Diagnosis not present

## 2018-02-05 DIAGNOSIS — R011 Cardiac murmur, unspecified: Secondary | ICD-10-CM | POA: Diagnosis not present

## 2018-02-05 DIAGNOSIS — N814 Uterovaginal prolapse, unspecified: Secondary | ICD-10-CM | POA: Diagnosis not present

## 2018-02-05 DIAGNOSIS — E119 Type 2 diabetes mellitus without complications: Secondary | ICD-10-CM | POA: Diagnosis not present

## 2018-02-11 DIAGNOSIS — Z6841 Body Mass Index (BMI) 40.0 and over, adult: Secondary | ICD-10-CM | POA: Diagnosis not present

## 2018-02-14 DIAGNOSIS — I10 Essential (primary) hypertension: Secondary | ICD-10-CM | POA: Diagnosis not present

## 2018-02-14 DIAGNOSIS — N181 Chronic kidney disease, stage 1: Secondary | ICD-10-CM | POA: Diagnosis not present

## 2018-02-14 DIAGNOSIS — E1159 Type 2 diabetes mellitus with other circulatory complications: Secondary | ICD-10-CM | POA: Diagnosis not present

## 2018-02-14 DIAGNOSIS — E1122 Type 2 diabetes mellitus with diabetic chronic kidney disease: Secondary | ICD-10-CM | POA: Diagnosis not present

## 2018-02-15 DIAGNOSIS — N99820 Postprocedural hemorrhage and hematoma of a genitourinary system organ or structure following a genitourinary system procedure: Secondary | ICD-10-CM | POA: Diagnosis not present

## 2018-02-15 DIAGNOSIS — N992 Postprocedural adhesions of vagina: Secondary | ICD-10-CM | POA: Diagnosis not present

## 2018-02-15 DIAGNOSIS — S3141XA Laceration without foreign body of vagina and vulva, initial encounter: Secondary | ICD-10-CM | POA: Diagnosis not present

## 2018-02-15 DIAGNOSIS — K4090 Unilateral inguinal hernia, without obstruction or gangrene, not specified as recurrent: Secondary | ICD-10-CM | POA: Diagnosis not present

## 2018-02-15 DIAGNOSIS — N200 Calculus of kidney: Secondary | ICD-10-CM | POA: Diagnosis not present

## 2018-02-16 DIAGNOSIS — N200 Calculus of kidney: Secondary | ICD-10-CM | POA: Diagnosis not present

## 2018-02-16 DIAGNOSIS — K4090 Unilateral inguinal hernia, without obstruction or gangrene, not specified as recurrent: Secondary | ICD-10-CM | POA: Diagnosis not present

## 2018-02-25 DIAGNOSIS — Z6841 Body Mass Index (BMI) 40.0 and over, adult: Secondary | ICD-10-CM | POA: Diagnosis not present

## 2018-03-03 DIAGNOSIS — E1159 Type 2 diabetes mellitus with other circulatory complications: Secondary | ICD-10-CM | POA: Diagnosis not present

## 2018-03-03 DIAGNOSIS — E785 Hyperlipidemia, unspecified: Secondary | ICD-10-CM | POA: Diagnosis not present

## 2018-03-03 DIAGNOSIS — E1122 Type 2 diabetes mellitus with diabetic chronic kidney disease: Secondary | ICD-10-CM | POA: Diagnosis not present

## 2018-03-10 DIAGNOSIS — E782 Mixed hyperlipidemia: Secondary | ICD-10-CM | POA: Diagnosis not present

## 2018-03-10 DIAGNOSIS — E1169 Type 2 diabetes mellitus with other specified complication: Secondary | ICD-10-CM | POA: Diagnosis not present

## 2018-03-10 DIAGNOSIS — E1122 Type 2 diabetes mellitus with diabetic chronic kidney disease: Secondary | ICD-10-CM | POA: Diagnosis not present

## 2018-03-10 DIAGNOSIS — N181 Chronic kidney disease, stage 1: Secondary | ICD-10-CM | POA: Diagnosis not present

## 2018-03-14 DIAGNOSIS — E1169 Type 2 diabetes mellitus with other specified complication: Secondary | ICD-10-CM | POA: Diagnosis not present

## 2018-03-14 DIAGNOSIS — N181 Chronic kidney disease, stage 1: Secondary | ICD-10-CM | POA: Diagnosis not present

## 2018-03-14 DIAGNOSIS — E1122 Type 2 diabetes mellitus with diabetic chronic kidney disease: Secondary | ICD-10-CM | POA: Diagnosis not present

## 2018-03-14 DIAGNOSIS — E782 Mixed hyperlipidemia: Secondary | ICD-10-CM | POA: Diagnosis not present

## 2018-03-25 DIAGNOSIS — Z6841 Body Mass Index (BMI) 40.0 and over, adult: Secondary | ICD-10-CM | POA: Diagnosis not present

## 2018-04-15 DIAGNOSIS — N181 Chronic kidney disease, stage 1: Secondary | ICD-10-CM | POA: Diagnosis not present

## 2018-04-15 DIAGNOSIS — E782 Mixed hyperlipidemia: Secondary | ICD-10-CM | POA: Diagnosis not present

## 2018-04-15 DIAGNOSIS — E1169 Type 2 diabetes mellitus with other specified complication: Secondary | ICD-10-CM | POA: Diagnosis not present

## 2018-04-15 DIAGNOSIS — E1122 Type 2 diabetes mellitus with diabetic chronic kidney disease: Secondary | ICD-10-CM | POA: Diagnosis not present

## 2018-05-05 DIAGNOSIS — Z1331 Encounter for screening for depression: Secondary | ICD-10-CM | POA: Diagnosis not present

## 2018-05-05 DIAGNOSIS — Z0000 Encounter for general adult medical examination without abnormal findings: Secondary | ICD-10-CM | POA: Diagnosis not present

## 2018-05-05 DIAGNOSIS — Z139 Encounter for screening, unspecified: Secondary | ICD-10-CM | POA: Diagnosis not present

## 2018-05-15 DIAGNOSIS — E1122 Type 2 diabetes mellitus with diabetic chronic kidney disease: Secondary | ICD-10-CM | POA: Diagnosis not present

## 2018-05-15 DIAGNOSIS — N181 Chronic kidney disease, stage 1: Secondary | ICD-10-CM | POA: Diagnosis not present

## 2018-05-15 DIAGNOSIS — E1169 Type 2 diabetes mellitus with other specified complication: Secondary | ICD-10-CM | POA: Diagnosis not present

## 2018-05-15 DIAGNOSIS — E782 Mixed hyperlipidemia: Secondary | ICD-10-CM | POA: Diagnosis not present

## 2018-06-13 DIAGNOSIS — N181 Chronic kidney disease, stage 1: Secondary | ICD-10-CM | POA: Diagnosis not present

## 2018-06-13 DIAGNOSIS — E1122 Type 2 diabetes mellitus with diabetic chronic kidney disease: Secondary | ICD-10-CM | POA: Diagnosis not present

## 2018-06-13 DIAGNOSIS — E1169 Type 2 diabetes mellitus with other specified complication: Secondary | ICD-10-CM | POA: Diagnosis not present

## 2018-06-13 DIAGNOSIS — E782 Mixed hyperlipidemia: Secondary | ICD-10-CM | POA: Diagnosis not present

## 2018-06-30 DIAGNOSIS — S63501D Unspecified sprain of right wrist, subsequent encounter: Secondary | ICD-10-CM | POA: Diagnosis not present

## 2018-07-02 DIAGNOSIS — E1122 Type 2 diabetes mellitus with diabetic chronic kidney disease: Secondary | ICD-10-CM | POA: Diagnosis not present

## 2018-07-02 DIAGNOSIS — E1159 Type 2 diabetes mellitus with other circulatory complications: Secondary | ICD-10-CM | POA: Diagnosis not present

## 2018-07-02 DIAGNOSIS — Z1231 Encounter for screening mammogram for malignant neoplasm of breast: Secondary | ICD-10-CM | POA: Diagnosis not present

## 2018-07-02 DIAGNOSIS — E785 Hyperlipidemia, unspecified: Secondary | ICD-10-CM | POA: Diagnosis not present

## 2018-07-09 DIAGNOSIS — E1122 Type 2 diabetes mellitus with diabetic chronic kidney disease: Secondary | ICD-10-CM | POA: Diagnosis not present

## 2018-07-09 DIAGNOSIS — N181 Chronic kidney disease, stage 1: Secondary | ICD-10-CM | POA: Diagnosis not present

## 2018-07-09 DIAGNOSIS — I10 Essential (primary) hypertension: Secondary | ICD-10-CM | POA: Diagnosis not present

## 2018-07-09 DIAGNOSIS — E1159 Type 2 diabetes mellitus with other circulatory complications: Secondary | ICD-10-CM | POA: Diagnosis not present

## 2018-07-15 DIAGNOSIS — Z6841 Body Mass Index (BMI) 40.0 and over, adult: Secondary | ICD-10-CM | POA: Diagnosis not present

## 2018-07-15 DIAGNOSIS — I10 Essential (primary) hypertension: Secondary | ICD-10-CM | POA: Diagnosis not present

## 2018-07-15 DIAGNOSIS — E1122 Type 2 diabetes mellitus with diabetic chronic kidney disease: Secondary | ICD-10-CM | POA: Diagnosis not present

## 2018-07-15 DIAGNOSIS — E1159 Type 2 diabetes mellitus with other circulatory complications: Secondary | ICD-10-CM | POA: Diagnosis not present

## 2018-08-15 DIAGNOSIS — E1122 Type 2 diabetes mellitus with diabetic chronic kidney disease: Secondary | ICD-10-CM | POA: Diagnosis not present

## 2018-08-15 DIAGNOSIS — Z6841 Body Mass Index (BMI) 40.0 and over, adult: Secondary | ICD-10-CM | POA: Diagnosis not present

## 2018-08-15 DIAGNOSIS — E1159 Type 2 diabetes mellitus with other circulatory complications: Secondary | ICD-10-CM | POA: Diagnosis not present

## 2018-08-15 DIAGNOSIS — I10 Essential (primary) hypertension: Secondary | ICD-10-CM | POA: Diagnosis not present

## 2018-09-08 DIAGNOSIS — N200 Calculus of kidney: Secondary | ICD-10-CM | POA: Diagnosis not present

## 2018-09-08 DIAGNOSIS — R51 Headache: Secondary | ICD-10-CM | POA: Diagnosis not present

## 2018-09-08 DIAGNOSIS — K760 Fatty (change of) liver, not elsewhere classified: Secondary | ICD-10-CM | POA: Diagnosis not present

## 2018-09-08 DIAGNOSIS — R1111 Vomiting without nausea: Secondary | ICD-10-CM | POA: Diagnosis not present

## 2018-09-08 DIAGNOSIS — R110 Nausea: Secondary | ICD-10-CM | POA: Diagnosis not present

## 2018-09-08 DIAGNOSIS — K529 Noninfective gastroenteritis and colitis, unspecified: Secondary | ICD-10-CM | POA: Diagnosis not present

## 2018-09-08 DIAGNOSIS — R1031 Right lower quadrant pain: Secondary | ICD-10-CM | POA: Diagnosis not present

## 2018-09-08 DIAGNOSIS — R1032 Left lower quadrant pain: Secondary | ICD-10-CM | POA: Diagnosis not present

## 2018-09-08 DIAGNOSIS — R52 Pain, unspecified: Secondary | ICD-10-CM | POA: Diagnosis not present

## 2018-09-08 DIAGNOSIS — R197 Diarrhea, unspecified: Secondary | ICD-10-CM | POA: Diagnosis not present

## 2018-09-15 DIAGNOSIS — E1159 Type 2 diabetes mellitus with other circulatory complications: Secondary | ICD-10-CM | POA: Diagnosis not present

## 2018-09-15 DIAGNOSIS — K529 Noninfective gastroenteritis and colitis, unspecified: Secondary | ICD-10-CM | POA: Diagnosis not present

## 2018-09-15 DIAGNOSIS — I10 Essential (primary) hypertension: Secondary | ICD-10-CM | POA: Diagnosis not present

## 2018-09-15 DIAGNOSIS — D72829 Elevated white blood cell count, unspecified: Secondary | ICD-10-CM | POA: Diagnosis not present

## 2018-09-15 DIAGNOSIS — Z6841 Body Mass Index (BMI) 40.0 and over, adult: Secondary | ICD-10-CM | POA: Diagnosis not present

## 2018-09-15 DIAGNOSIS — Z7689 Persons encountering health services in other specified circumstances: Secondary | ICD-10-CM | POA: Diagnosis not present

## 2018-09-15 DIAGNOSIS — E1122 Type 2 diabetes mellitus with diabetic chronic kidney disease: Secondary | ICD-10-CM | POA: Diagnosis not present

## 2018-09-17 DIAGNOSIS — E119 Type 2 diabetes mellitus without complications: Secondary | ICD-10-CM | POA: Diagnosis not present

## 2018-09-17 DIAGNOSIS — T7840XA Allergy, unspecified, initial encounter: Secondary | ICD-10-CM | POA: Diagnosis not present

## 2018-09-17 LAB — GLUCOSE, CAPILLARY

## 2018-09-18 DIAGNOSIS — Z7689 Persons encountering health services in other specified circumstances: Secondary | ICD-10-CM | POA: Diagnosis not present

## 2018-09-18 DIAGNOSIS — K529 Noninfective gastroenteritis and colitis, unspecified: Secondary | ICD-10-CM | POA: Diagnosis not present

## 2018-09-18 DIAGNOSIS — D72829 Elevated white blood cell count, unspecified: Secondary | ICD-10-CM | POA: Diagnosis not present

## 2018-09-18 DIAGNOSIS — Z6841 Body Mass Index (BMI) 40.0 and over, adult: Secondary | ICD-10-CM | POA: Diagnosis not present

## 2018-09-19 ENCOUNTER — Encounter: Payer: Self-pay | Admitting: Gastroenterology

## 2018-09-23 ENCOUNTER — Encounter: Payer: Self-pay | Admitting: Gastroenterology

## 2018-09-29 DIAGNOSIS — K529 Noninfective gastroenteritis and colitis, unspecified: Secondary | ICD-10-CM | POA: Diagnosis not present

## 2018-09-29 DIAGNOSIS — Z139 Encounter for screening, unspecified: Secondary | ICD-10-CM | POA: Diagnosis not present

## 2018-09-29 DIAGNOSIS — D72829 Elevated white blood cell count, unspecified: Secondary | ICD-10-CM | POA: Diagnosis not present

## 2018-10-07 ENCOUNTER — Other Ambulatory Visit: Payer: Self-pay

## 2018-10-07 ENCOUNTER — Ambulatory Visit: Payer: Medicare HMO | Admitting: Gastroenterology

## 2018-10-07 ENCOUNTER — Encounter: Payer: Self-pay | Admitting: Gastroenterology

## 2018-10-07 VITALS — BP 128/70 | HR 73 | Temp 97.9°F | Ht 61.0 in | Wt 225.0 lb

## 2018-10-07 DIAGNOSIS — R011 Cardiac murmur, unspecified: Secondary | ICD-10-CM | POA: Diagnosis not present

## 2018-10-07 DIAGNOSIS — R197 Diarrhea, unspecified: Secondary | ICD-10-CM | POA: Diagnosis not present

## 2018-10-07 MED ORDER — CHOLESTYRAMINE 4 G PO PACK: 4.0000 g | PACK | Freq: Every day | ORAL | 11 refills | Status: DC

## 2018-10-07 NOTE — Progress Notes (Signed)
Chief Complaint:   Referring Provider:  Maryella Shivers, MD      ASSESSMENT AND PLAN;   #1. Diarrhea with elevated WBC count with CT showing ? Colitis at [facility]. Treated with Cipro/Flagyl which resulted in rash, then switched to Augmentin x 7 days. Neg C. Diff.  Also had more stool studies per patient. D/d includes irritable bowel syndrome with predominant diarrhea, infectious causes, postcholecystectomy diarrhea, diarrhea due to medications (like metformin/ozempic), microscopic colitis, IBD, malabsorption, celiac disease and hypothyroidism.  #2. H/O polyps.  #3. Heart murmer, SOB and some leg swelling. R/O associated cardiac problems.  Plan: - Please obtain previous records from [facility] (Had CT, labs and stool studies) and Dr Nyra Capes.  Apparently her celiac screen and TSH was normal. - Proceed with colonoscopy after cardiology clearence with MiraLAX preparation off Plavix for 5 days. Discussed risks & benefits. (Risks including rare perforation req laparotomy, bleeding after biopsies/polypectomy req blood transfusion, rare chance of missing neoplasms, risks of anesthesia/sedation). Benefits outweigh the risks. Patient agrees to proceed. All the questions were answered. Consent forms given for review. - Resume Imodium as before.  She would take 2 in the morning. - Cholestryamine 4g po qd, at least 2 hours before or after rest of the medications - Cardiol appt RE: heart murmer, SOB and some leg swelling.  And for clearance.   Addendum: Got records from [facility]: 09/15/2018: neg C Diff.  WBC count 18.6K, MCV 82, platelets 440, Hb 11.7 with left shift neutrophils 87%.  Normal CMP except glucose 155. CT AP 09/08/2018 mild generalized acute colitis, hepatic steatosis, cardiomegaly.  Patient also had CT 02/16/2018 (after hysterectomy, repair of rectal prolapse January 2020)-right inguinal hernia, fatty liver, right kidney stone.  WBC count was elevated at 12.4 with platelets 497, hemoglobin 12.2   HPI:    Gloria Sharp is a 73 y.o. female  Diarrhea x over 10 to 12 years, gotten worse over the last 6 months.  Currently having bowel movements at the frequency of 8 to 10/day, mostly postprandial.  Had nocturnal symptoms lately.  Has been tested negative for C. Difficile.  Also had lower abdominal pain requiring visit to ED at [facility].  Underwent CT Abdo/pelvis (we do not have any records currently)-showed colitis.  Was treated with Flagyl and Cipro but unfortunately had rash.  Switched to Augmentin x 7 days with some relief.  Had lost 10 pounds over the last 3 to 4 years.  No melena or hematochezia.  Does complain of abdominal bloating.  Does admit that metformin would cause diarrhea in the past.  Also has been given ozempic.  Denies having any upper GI symptoms including nausea, vomiting, heartburn, regurgitation odynophagia or dysphagia. Past Medical History:  Diagnosis Date  . CVA (cerebral vascular accident) ([place])   . Diabetes ([place])   . Fibromyalgia   . GERD (gastroesophageal reflux disease)   . Hypercholesterolemia   . Hypertension   . Leukocytosis   . Obesity     Past Surgical History:  Procedure Laterality Date  . ABDOMINAL HYSTERECTOMY     partial, left the ovaries  . APPENDECTOMY    . COLONOSCOPY  11/11/2013   Colonioc polyps status post polypectomy. Pancolonic diverticulosis predominately in the sigmoid colon  . DILATION AND CURETTAGE OF UTERUS    . HERNIA REPAIR     Umbilical  . vaginal polyp removal      Family History  Problem Relation Age of Onset  . Colon cancer Neg Hx  Social History   Tobacco Use  . Smoking status: Never Smoker  . Smokeless tobacco: Never Used  Substance Use Topics  . Alcohol use: Not Currently  . Drug use: Never    Current Outpatient Medications  Medication Sig Dispense Refill  . clobetasol cream (TEMOVATE) [id number] % Apply 1 application topically as directed.    . clopidogrel (PLAVIX) 75 MG tablet Take 75 mg by mouth daily.     . fenofibrate (TRICOR) 145 MG tablet Take 145 mg by mouth daily.    Marland Kitchen gabapentin (NEURONTIN) 300 MG capsule Take 300 mg by mouth 2 (two) times daily.    Marland Kitchen glimepiride (AMARYL) 2 MG tablet Take 2 mg by mouth 2 (two) times daily.    Marland Kitchen lisinopril-hydrochlorothiazide (ZESTORETIC) 20-12.5 MG tablet Take 1 tablet by mouth daily.    . meclizine (ANTIVERT) 25 MG tablet Take 25 mg by mouth 3 (three) times daily as needed for dizziness.    . metFORMIN (GLUCOPHAGE) 1000 MG tablet Take 1,000 mg by mouth 2 (two) times daily with a meal.    . METOPROLOL TARTRATE PO Take 100 mg by mouth daily.    . pantoprazole (PROTONIX) 40 MG tablet Take 40 mg by mouth daily as needed.    . pravastatin (PRAVACHOL) 20 MG tablet Take 20 mg by mouth once a week.    . Semaglutide, 1 MG/DOSE, (OZEMPIC, 1 MG/DOSE,) 2 MG/1.5ML SOPN Inject into the skin once a week.     No current facility-administered medications for this visit.     Allergies  Allergen Reactions  . Celecoxib   . Nabumetone Itching and Swelling    Itching   . Ciprofloxacin Hives, Swelling and Rash  . Flagyl [Metronidazole] Itching, Swelling and Rash    Review of Systems:  Constitutional: Denies fever, chills, diaphoresis, appetite change and fatigue.  HEENT: Denies photophobia, eye pain, redness, hearing loss, ear pain, congestion, sore throat, rhinorrhea, sneezing, mouth sores, neck pain, neck stiffness and tinnitus.   Respiratory: Does complain of shortness of breath. Cardiovascular: Denies chest pain, palpitations and has leg swelling.  Genitourinary: Denies dysuria, urgency, frequency, hematuria, flank pain and difficulty urinating.  Musculoskeletal: has myalgias, back pain, joint swelling, arthralgias and gait problem.  Skin: No rash.  Neurological: Denies dizziness, seizures, syncope, weakness, light-headedness, numbness and headaches.  Hematological: Denies adenopathy. Easy bruising, personal or family bleeding history  Psychiatric/Behavioral:  Has anxiety or depression     Physical Exam:    BP 128/70   Pulse 73   Temp 97.9 F (36.6 C)   Ht 5\' 1"  (1.549 m)   Wt 225 lb (102.1 kg)   SpO2 98%   BMI 42.51 kg/m  Filed Weights   10/07/18 0845  Weight: 225 lb (102.1 kg)   Constitutional:  Well-developed, in no acute distress. Psychiatric: Normal mood and affect. Behavior is normal. HEENT: Pupils normal.  Conjunctivae are normal. No scleral icterus. Neck supple.  Cardiovascular: Normal rate, regular rhythm. No edema.  Has a loud ejection systolic murmur in the aortic area. Pulmonary/chest: Effort normal and breath sounds normal. No wheezing, rales or rhonchi. Abdominal: Soft, nondistended. Nontender. Bowel sounds active throughout. There are no masses palpable.  Liver palpated 2 cm below the costal margin. Rectal:  defered Neurological: Alert and oriented to person place and time. Skin: Skin is warm and dry. No rashes noted. Extremities-1+ edema  Data Reviewed: I have personally reviewed following labs and imaging studies See above.  Extensive notes, records from [facility] were reviewed.  These has been summarized above   Carmell Austria, MD 10/07/2018, 9:06 AM  Cc: Gloria Shivers, MD

## 2018-10-07 NOTE — Patient Instructions (Addendum)
If you are age 71 or older, your body mass index should be between 23-30. Your Body mass index is 42.51 kg/m. If this is out of the aforementioned range listed, please consider follow up with your Primary Care Provider.  If you are age 15 or younger, your body mass index should be between 19-25. Your Body mass index is 42.51 kg/m. If this is out of the aformentioned range listed, please consider follow up with your Primary Care Provider.   We have sent the following medications to your pharmacy for you to pick up at your convenience: Cholestyamine  Please call and make an appointment with a cardiologist:  [phone]  Once you have had your appointment with Cardiology please call our office at [phone] to set up your colonoscopy.  Thank you,  Dr. Jackquline Denmark

## 2018-10-10 ENCOUNTER — Ambulatory Visit (INDEPENDENT_AMBULATORY_CARE_PROVIDER_SITE_OTHER): Payer: Medicare HMO | Admitting: Cardiology

## 2018-10-10 ENCOUNTER — Encounter: Payer: Self-pay | Admitting: Cardiology

## 2018-10-10 ENCOUNTER — Other Ambulatory Visit: Payer: Self-pay

## 2018-10-10 ENCOUNTER — Ambulatory Visit (INDEPENDENT_AMBULATORY_CARE_PROVIDER_SITE_OTHER): Payer: Medicare HMO

## 2018-10-10 VITALS — BP 136/80 | HR 80 | Ht 61.0 in | Wt 226.0 lb

## 2018-10-10 DIAGNOSIS — E119 Type 2 diabetes mellitus without complications: Secondary | ICD-10-CM

## 2018-10-10 DIAGNOSIS — I10 Essential (primary) hypertension: Secondary | ICD-10-CM

## 2018-10-10 DIAGNOSIS — Z8673 Personal history of transient ischemic attack (TIA), and cerebral infarction without residual deficits: Secondary | ICD-10-CM

## 2018-10-10 DIAGNOSIS — R011 Cardiac murmur, unspecified: Secondary | ICD-10-CM

## 2018-10-10 DIAGNOSIS — E785 Hyperlipidemia, unspecified: Secondary | ICD-10-CM

## 2018-10-10 DIAGNOSIS — R002 Palpitations: Secondary | ICD-10-CM

## 2018-10-10 HISTORY — DX: Personal history of transient ischemic attack (TIA), and cerebral infarction without residual deficits: Z86.73

## 2018-10-10 HISTORY — DX: Hyperlipidemia, unspecified: E78.5

## 2018-10-10 HISTORY — DX: Essential (primary) hypertension: I10

## 2018-10-10 HISTORY — DX: Cardiac murmur, unspecified: R01.1

## 2018-10-10 HISTORY — DX: Type 2 diabetes mellitus without complications: E11.9

## 2018-10-10 NOTE — Patient Instructions (Addendum)
Medication Instructions:  Your physician recommends that you continue on your current medications as directed. Please refer to the Current Medication list given to you today.  If you need a refill on your cardiac medications before your next appointment, please call your pharmacy.   Lab work: None  If you have labs (blood work) drawn today and your tests are completely normal, you will receive your results only by: Marland Kitchen MyChart Message (if you have MyChart) OR . A paper copy in the mail If you have any lab test that is abnormal or we need to change your treatment, we will call you to review the results.  Testing/Procedures: You had an EKG today.   Your physician has requested that you have an echocardiogram. Echocardiography is a painless test that uses sound waves to create images of your heart. It provides your doctor with information about the size and shape of your heart and how well your heart's chambers and valves are working. This procedure takes approximately one hour. There are no restrictions for this procedure.  Your physician has recommended that you wear a ZIO monitor. ZIO monitors are medical devices that record the heart's electrical activity. Doctors most often use these monitors to diagnose arrhythmias. Arrhythmias are problems with the speed or rhythm of the heartbeat. The monitor is a small, portable device. You can wear one while you do your normal daily activities. This is usually used to diagnose what is causing palpitations/syncope (passing out). Wear for 14 days.   Follow-Up: At [facility], you and your health needs are our priority.  As part of our continuing mission to provide you with exceptional heart care, we have created designated Provider Care Teams.  These Care Teams include your primary Cardiologist (physician) and Advanced Practice Providers (APPs -  Physician Assistants and Nurse Practitioners) who all work together to provide you with the care you need, when  you need it. You will need a follow up appointment in 6 weeks.      Echocardiogram An echocardiogram is a procedure that uses painless sound waves (ultrasound) to produce an image of the heart. Images from an echocardiogram can provide important information about:  Signs of coronary artery disease (CAD).  Aneurysm detection. An aneurysm is a weak or damaged part of an artery wall that bulges out from the normal force of blood pumping through the body.  Heart size and shape. Changes in the size or shape of the heart can be associated with certain conditions, including heart failure, aneurysm, and CAD.  Heart muscle function.  Heart valve function.  Signs of a past heart attack.  Fluid buildup around the heart.  Thickening of the heart muscle.  A tumor or infectious growth around the heart valves. Tell a health care provider about:  Any allergies you have.  All medicines you are taking, including vitamins, herbs, eye drops, creams, and over-the-counter medicines.  Any blood disorders you have.  Any surgeries you have had.  Any medical conditions you have.  Whether you are pregnant or may be pregnant. What are the risks? Generally, this is a safe procedure. However, problems may occur, including:  Allergic reaction to dye (contrast) that may be used during the procedure. What happens before the procedure? No specific preparation is needed. You may eat and drink normally. What happens during the procedure?   An IV tube may be inserted into one of your veins.  You may receive contrast through this tube. A contrast is an injection that improves  the quality of the pictures from your heart.  A gel will be applied to your chest.  A wand-like tool (transducer) will be moved over your chest. The gel will help to transmit the sound waves from the transducer.  The sound waves will harmlessly bounce off of your heart to allow the heart images to be captured in real-time  motion. The images will be recorded on a computer. The procedure may vary among health care providers and hospitals. What happens after the procedure?  You may return to your normal, everyday life, including diet, activities, and medicines, unless your health care provider tells you not to do that. Summary  An echocardiogram is a procedure that uses painless sound waves (ultrasound) to produce an image of the heart.  Images from an echocardiogram can provide important information about the size and shape of your heart, heart muscle function, heart valve function, and fluid buildup around your heart.  You do not need to do anything to prepare before this procedure. You may eat and drink normally.  After the echocardiogram is completed, you may return to your normal, everyday life, unless your health care provider tells you not to do that. This information is not intended to replace advice given to you by your health care provider. Make sure you discuss any questions you have with your health care provider. Document Released: 12/30/1999 Document Revised: 04/24/2018 Document Reviewed: 02/04/2016 Elsevier Patient Education  2020 [employer].

## 2018-10-10 NOTE — Progress Notes (Signed)
Cardiology Consultation:    Date:  10/10/2018   ID:  SHAMERE SINN, DOB 10/29/1945, MRN [id number]  PCP:  Maryella Shivers, MD  Cardiologist:  Jenne Campus, MD   Referring MD: Maryella Shivers, MD   Chief Complaint  Patient presents with   Pre-op Exam  I need colonoscopy  History of Present Illness:    Gloria Sharp is a 73 y.o. female who is being seen today for the evaluation of heart murmur at the request of Maryella Shivers, MD.  Her past medical history significant for hypertension dyslipidemia diabetes for about 10 years, also CVA that she suffered from about 5 years ago.  She still having some GI issue with chronic diarrhea and up seeing Dr. Lyndel Safe wants to do colonoscopy on her.  He listened to her heart find heart murmur and she was referred to [place] for evaluation.  She tells me that she was told about heart murmur long time ago.  However, she never had any investigation done regarding this murmur.  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  Denies having any chest pain, tightness, squeezing, pressure, burning in the chest fatigue and tiredness is there.  There is no palpitations no dizziness no passing out.  Past Medical History:  Diagnosis Date   CVA (cerebral vascular accident) ([place])    Diabetes ([place])    Fibromyalgia    GERD (gastroesophageal reflux disease)    Hypercholesterolemia    Hypertension    Leukocytosis    Obesity     Past Surgical History:  Procedure Laterality Date   ABDOMINAL HYSTERECTOMY     partial, left the ovaries   APPENDECTOMY     COLONOSCOPY  11/11/2013   Colonioc polyps status post polypectomy. Pancolonic diverticulosis predominately in the sigmoid colon   DILATION AND CURETTAGE OF UTERUS     HERNIA REPAIR     Umbilical   vaginal polyp removal      Current Medications: Current Meds  Medication  Sig   cholestyramine (QUESTRAN) 4 g packet Take 1 packet (4 g total) by mouth daily.   clobetasol cream (TEMOVATE) [id number] % Apply 1 application topically as directed.   clopidogrel (PLAVIX) 75 MG tablet Take 75 mg by mouth daily.   fenofibrate (TRICOR) 145 MG tablet Take 145 mg by mouth daily.   gabapentin (NEURONTIN) 300 MG capsule Take 300 mg by mouth 2 (two) times daily.   glimepiride (AMARYL) 2 MG tablet Take 2 mg by mouth 2 (two) times daily.   lisinopril-hydrochlorothiazide (ZESTORETIC) 20-12.5 MG tablet Take 1 tablet by mouth daily.   meclizine (ANTIVERT) 25 MG tablet Take 25 mg by mouth 3 (three) times daily as needed for dizziness.   metFORMIN (GLUCOPHAGE) 1000 MG tablet Take 1,000 mg by mouth 2 (two) times daily with a meal.   metoprolol tartrate (LOPRESSOR) 100 MG tablet Take 100 mg by mouth at bedtime.    pantoprazole (PROTONIX) 40 MG tablet Take 40 mg by mouth daily as needed.   pravastatin (PRAVACHOL) 20 MG tablet Take 20 mg by mouth once a week.   Semaglutide, 1 MG/DOSE, (OZEMPIC, 1 MG/DOSE,) 2 MG/1.5ML SOPN Inject into the skin once a week.     Allergies:   Celecoxib, Nabumetone, Ciprofloxacin, and Flagyl [metronidazole]   Social History   Socioeconomic History   Marital status: Married    Spouse name:  Not on file   Number of children: Not on file   Years of education: Not on file   Highest education level: Not on file  Occupational History   Not on file  Social Needs   Financial resource strain: Not on file   Food insecurity    Worry: Not on file    Inability: Not on file   Transportation needs    Medical: Not on file    Non-medical: Not on file  Tobacco Use   Smoking status: Never Smoker   Smokeless tobacco: Never Used  Substance and Sexual Activity   Alcohol use: Not Currently   Drug use: Never   Sexual activity: Not on file  Lifestyle   Physical activity    Days per week: Not on file    Minutes per session: Not on file    Stress: Not on file  Relationships   Social connections    Talks on phone: Not on file    Gets together: Not on file    Attends religious service: Not on file    Active member of club or organization: Not on file    Attends meetings of clubs or organizations: Not on file    Relationship status: Not on file  Other Topics Concern   Not on file  Social History Narrative   Not on file     Family History: The patient's family history includes Cancer in her mother; Diabetes in her mother; Heart disease in her father; Heart failure in her mother; Hypertension in her father and mother; Stroke in her mother. There is no history of Colon cancer. ROS:   Please see the history of present illness.    All 14 point review of systems negative except as described per history of present illness.  EKGs/Labs/Other Studies Reviewed:    The following studies were reviewed today:   EKG:  EKG is  ordered today.  The ekg ordered today demonstrates normal sinus rhythm, normal P interval, normal QS complex duration morphology nonspecific ST segment changes  Recent Labs: No results found for requested labs within last 8760 hours.  Recent Lipid Panel No results found for: CHOL, TRIG, HDL, CHOLHDL, VLDL, LDLCALC, LDLDIRECT  Physical Exam:    VS:  BP 136/80    Pulse 80    Ht 5\' 1"  (1.549 m)    Wt 226 lb (102.5 kg)    SpO2 98%    BMI 42.70 kg/m     Wt Readings from Last 3 Encounters:  10/10/18 226 lb (102.5 kg)  10/07/18 225 lb (102.1 kg)     GEN:  Well nourished, well developed in no acute distress HEENT: Normal NECK: No JVD; No carotid bruits LYMPHATICS: No lymphadenopathy CARDIAC: RRR, there is systolic ejection murmur grade 2/6 to 3/6 best at the right upper portion of the sternum, S2 is still present, there is also very soft diastolic murmur heard at the aortic area which is a blowing type.  On top of that there is holosystolic murmur best heard at apex with no clear-cut radiation grade 1/6 to  2/6, no rubs, no gallops RESPIRATORY:  Clear to auscultation without rales, wheezing or rhonchi  ABDOMEN: Soft, non-tender, non-distended MUSCULOSKELETAL:  No edema; No deformity  SKIN: Warm and dry NEUROLOGIC:  Alert and oriented x 3 PSYCHIATRIC:  Normal affect   ASSESSMENT:    1. Heart murmur   2. Essential hypertension   3. Dyslipidemia   4. History of CVA (cerebrovascular accident)   5.  Type 2 diabetes mellitus without complication, without long-term current use of insulin (HCC)    PLAN:    In order of problems listed above:  1. Heart murmur on the physical examination I have evidence of a potentially aortic stenosis likely appearing not critical, also there is soft diastolic murmur suggesting possibility of aortic insufficiency.  There is also some cyst holosystolic murmur rising suspicion for mitral regurgitation.  Overall clinically she is doing well.  We will get echocardiogram to clarify diagnosis 2. Essential hypertension blood pressure appears to be well controlled 3. Dyslipidemia she is on Pravachol will call primary care physician to get fasting lipid profile 4. History of CVA about 5 years ago apparently did have carotic ultrasound done at that time which was normal.  However considering her valvular abnormality I think we must rule out potentially having atrial fibrillation which will require more aggressive anticoagulation.  I will put a monitor on her for 2 weeks to see if there is any evidence of atrial fibrillation 5. Type 2 diabetes managed by internal medicine team   Medication Adjustments/Labs and Tests Ordered: Current medicines are reviewed at length with the patient today.  Concerns regarding medicines are outlined above.  No orders of the defined types were placed in this encounter.  No orders of the defined types were placed in this encounter.   Signed, Park Liter, MD, [facility]. 10/10/2018 1:48 PM    [facility]

## 2018-10-10 NOTE — Progress Notes (Signed)
Complete echocardiogram has been performed.  Jimmy Fatih Stalvey RDCS, RVT 

## 2018-10-12 LAB — ECHOCARDIOGRAM COMPLETE
Height: 61 in
Weight: 3616 oz

## 2018-10-13 ENCOUNTER — Ambulatory Visit: Payer: Medicare HMO | Admitting: Cardiology

## 2018-10-13 ENCOUNTER — Telehealth: Payer: Self-pay | Admitting: Emergency Medicine

## 2018-10-13 NOTE — Telephone Encounter (Signed)
Left message for patient to return call regarding test results.  

## 2018-10-14 NOTE — Telephone Encounter (Signed)
Patient returned call, please call back at [phone]

## 2018-10-14 NOTE — Telephone Encounter (Signed)
Called patient back and informed her of results. No further questions.

## 2018-10-15 DIAGNOSIS — I10 Essential (primary) hypertension: Secondary | ICD-10-CM | POA: Diagnosis not present

## 2018-10-15 DIAGNOSIS — E1122 Type 2 diabetes mellitus with diabetic chronic kidney disease: Secondary | ICD-10-CM | POA: Diagnosis not present

## 2018-10-15 DIAGNOSIS — Z6841 Body Mass Index (BMI) 40.0 and over, adult: Secondary | ICD-10-CM | POA: Diagnosis not present

## 2018-10-15 DIAGNOSIS — E1159 Type 2 diabetes mellitus with other circulatory complications: Secondary | ICD-10-CM | POA: Diagnosis not present

## 2018-10-20 ENCOUNTER — Telehealth: Payer: Self-pay | Admitting: Gastroenterology

## 2018-10-20 ENCOUNTER — Ambulatory Visit (INDEPENDENT_AMBULATORY_CARE_PROVIDER_SITE_OTHER): Payer: Medicare HMO

## 2018-10-20 DIAGNOSIS — R002 Palpitations: Secondary | ICD-10-CM | POA: Diagnosis not present

## 2018-10-20 DIAGNOSIS — R011 Cardiac murmur, unspecified: Secondary | ICD-10-CM | POA: Diagnosis not present

## 2018-10-20 DIAGNOSIS — E785 Hyperlipidemia, unspecified: Secondary | ICD-10-CM

## 2018-10-20 DIAGNOSIS — I10 Essential (primary) hypertension: Secondary | ICD-10-CM

## 2018-10-20 DIAGNOSIS — E119 Type 2 diabetes mellitus without complications: Secondary | ICD-10-CM | POA: Diagnosis not present

## 2018-10-20 DIAGNOSIS — Z8673 Personal history of transient ischemic attack (TIA), and cerebral infarction without residual deficits: Secondary | ICD-10-CM | POA: Diagnosis not present

## 2018-10-20 NOTE — Telephone Encounter (Signed)
I have called patient and left message for her to return my call.  

## 2018-10-20 NOTE — Telephone Encounter (Signed)
Pt inquired about paperwork for cardiologist and FMLA paperwork.

## 2018-10-24 DIAGNOSIS — M5412 Radiculopathy, cervical region: Secondary | ICD-10-CM | POA: Diagnosis not present

## 2018-10-24 DIAGNOSIS — I10 Essential (primary) hypertension: Secondary | ICD-10-CM | POA: Diagnosis not present

## 2018-10-31 DIAGNOSIS — E785 Hyperlipidemia, unspecified: Secondary | ICD-10-CM | POA: Diagnosis not present

## 2018-10-31 DIAGNOSIS — E1122 Type 2 diabetes mellitus with diabetic chronic kidney disease: Secondary | ICD-10-CM | POA: Diagnosis not present

## 2018-10-31 DIAGNOSIS — E1159 Type 2 diabetes mellitus with other circulatory complications: Secondary | ICD-10-CM | POA: Diagnosis not present

## 2018-11-07 DIAGNOSIS — N181 Chronic kidney disease, stage 1: Secondary | ICD-10-CM | POA: Diagnosis not present

## 2018-11-07 DIAGNOSIS — E785 Hyperlipidemia, unspecified: Secondary | ICD-10-CM | POA: Diagnosis not present

## 2018-11-07 DIAGNOSIS — Z6841 Body Mass Index (BMI) 40.0 and over, adult: Secondary | ICD-10-CM | POA: Diagnosis not present

## 2018-11-07 DIAGNOSIS — E1140 Type 2 diabetes mellitus with diabetic neuropathy, unspecified: Secondary | ICD-10-CM | POA: Diagnosis not present

## 2018-11-07 DIAGNOSIS — Z23 Encounter for immunization: Secondary | ICD-10-CM | POA: Diagnosis not present

## 2018-11-07 DIAGNOSIS — E1122 Type 2 diabetes mellitus with diabetic chronic kidney disease: Secondary | ICD-10-CM | POA: Diagnosis not present

## 2018-11-11 DIAGNOSIS — R002 Palpitations: Secondary | ICD-10-CM | POA: Diagnosis not present

## 2018-11-14 DIAGNOSIS — E1122 Type 2 diabetes mellitus with diabetic chronic kidney disease: Secondary | ICD-10-CM | POA: Diagnosis not present

## 2018-11-14 DIAGNOSIS — N181 Chronic kidney disease, stage 1: Secondary | ICD-10-CM | POA: Diagnosis not present

## 2018-11-14 DIAGNOSIS — E785 Hyperlipidemia, unspecified: Secondary | ICD-10-CM | POA: Diagnosis not present

## 2018-11-14 DIAGNOSIS — E1140 Type 2 diabetes mellitus with diabetic neuropathy, unspecified: Secondary | ICD-10-CM | POA: Diagnosis not present

## 2018-11-24 ENCOUNTER — Other Ambulatory Visit: Payer: Self-pay

## 2018-11-24 ENCOUNTER — Ambulatory Visit (INDEPENDENT_AMBULATORY_CARE_PROVIDER_SITE_OTHER): Payer: Medicare HMO | Admitting: Cardiology

## 2018-11-24 DIAGNOSIS — I4729 Other ventricular tachycardia: Secondary | ICD-10-CM

## 2018-11-24 DIAGNOSIS — I340 Nonrheumatic mitral (valve) insufficiency: Secondary | ICD-10-CM | POA: Insufficient documentation

## 2018-11-24 DIAGNOSIS — I472 Ventricular tachycardia: Secondary | ICD-10-CM | POA: Insufficient documentation

## 2018-11-24 DIAGNOSIS — E1122 Type 2 diabetes mellitus with diabetic chronic kidney disease: Secondary | ICD-10-CM | POA: Diagnosis not present

## 2018-11-24 DIAGNOSIS — Z6841 Body Mass Index (BMI) 40.0 and over, adult: Secondary | ICD-10-CM | POA: Diagnosis not present

## 2018-11-24 DIAGNOSIS — N181 Chronic kidney disease, stage 1: Secondary | ICD-10-CM | POA: Diagnosis not present

## 2018-11-24 HISTORY — DX: Other ventricular tachycardia: I47.29

## 2018-11-24 HISTORY — DX: Ventricular tachycardia: I47.2

## 2018-11-24 HISTORY — DX: Nonrheumatic mitral (valve) insufficiency: I34.0

## 2018-11-24 NOTE — Progress Notes (Signed)
Cardiology Office Note:    Date:  11/24/2018   ID:  Gloria Sharp, DOB 09-12-1945, MRN [id number]  PCP:  Maryella Shivers, MD  Cardiologist:  Jenne Campus, MD    Referring MD: Maryella Shivers, MD   Chief Complaint  Patient presents with  . 6 week follow up    History of Present Illness:    Gloria Sharp is a 73 y.o. female comes to me in recently because of heart murmur discovered.  Also she got history of CVA.  Other investigation included echocardiogram and we find out that she got mild to moderate mitral regurgitation, also she wear event recorder looking for any evidence of atrial fibrillation interestingly it showed nonsustained ventricular tachycardia.  Denies having any passing out dizziness no chest pain no tightness no squeezing no pressure no burning chest.  She does have however problem with her hip and that bothers h her the most  Past Medical History:  Diagnosis Date  . CVA (cerebral vascular accident) ([place])   . Diabetes ([place])   . Fibromyalgia   . GERD (gastroesophageal reflux disease)   . Hypercholesterolemia   . Hypertension   . Leukocytosis   . Obesity     Past Surgical History:  Procedure Laterality Date  . ABDOMINAL HYSTERECTOMY     partial, left the ovaries  . APPENDECTOMY    . COLONOSCOPY  11/11/2013   Colonioc polyps status post polypectomy. Pancolonic diverticulosis predominately in the sigmoid colon  . DILATION AND CURETTAGE OF UTERUS    . HERNIA REPAIR     Umbilical  . vaginal polyp removal      Current Medications: Current Meds  Medication Sig  . cholestyramine (QUESTRAN) 4 g packet Take 1 packet (4 g total) by mouth daily.  . clobetasol cream (TEMOVATE) [id number] % Apply 1 application topically as directed.  . clopidogrel (PLAVIX) 75 MG tablet Take 75 mg by mouth daily.  . fenofibrate (TRICOR) 145 MG tablet Take 145 mg by mouth daily.  Marland Kitchen gabapentin (NEURONTIN) 300 MG capsule Take 300 mg by mouth 2 (two) times daily.  Marland Kitchen glimepiride  (AMARYL) 2 MG tablet Take 2 mg by mouth 2 (two) times daily.  Marland Kitchen lisinopril-hydrochlorothiazide (ZESTORETIC) 20-12.5 MG tablet Take 1 tablet by mouth daily.  . meclizine (ANTIVERT) 25 MG tablet Take 25 mg by mouth 3 (three) times daily as needed for dizziness.  . metFORMIN (GLUCOPHAGE) 1000 MG tablet Take 1,000 mg by mouth 2 (two) times daily with a meal.  . metoprolol tartrate (LOPRESSOR) 100 MG tablet Take 100 mg by mouth at bedtime.   . pantoprazole (PROTONIX) 40 MG tablet Take 40 mg by mouth daily as needed.  . pravastatin (PRAVACHOL) 20 MG tablet Take 20 mg by mouth once a week.  . Semaglutide, 1 MG/DOSE, (OZEMPIC, 1 MG/DOSE,) 2 MG/1.5ML SOPN Inject into the skin once a week.     Allergies:   Celecoxib, Nabumetone, Ciprofloxacin, and Flagyl [metronidazole]   Social History   Socioeconomic History  . Marital status: Married    Spouse name: Not on file  . Number of children: Not on file  . Years of education: Not on file  . Highest education level: Not on file  Occupational History  . Not on file  Social Needs  . Financial resource strain: Not on file  . Food insecurity    Worry: Not on file    Inability: Not on file  . Transportation needs    Medical: Not on file  Non-medical: Not on file  Tobacco Use  . Smoking status: Never Smoker  . Smokeless tobacco: Never Used  Substance and Sexual Activity  . Alcohol use: Not Currently  . Drug use: Never  . Sexual activity: Not on file  Lifestyle  . Physical activity    Days per week: Not on file    Minutes per session: Not on file  . Stress: Not on file  Relationships  . Social Herbalist on phone: Not on file    Gets together: Not on file    Attends religious service: Not on file    Active member of club or organization: Not on file    Attends meetings of clubs or organizations: Not on file    Relationship status: Not on file  Other Topics Concern  . Not on file  Social History Narrative  . Not on file      Family History: The patient's family history includes Cancer in her mother; Diabetes in her mother; Heart disease in her father; Heart failure in her mother; Hypertension in her father and mother; Stroke in her mother. There is no history of Colon cancer. ROS:   Please see the history of present illness.    All 14 point review of systems negative except as described per history of present illness  EKGs/Labs/Other Studies Reviewed:      Recent Labs: No results found for requested labs within last 8760 hours.  Recent Lipid Panel No results found for: CHOL, TRIG, HDL, CHOLHDL, VLDL, LDLCALC, LDLDIRECT  Physical Exam:    VS:  BP 132/62   Pulse 90   Ht 5\' 1"  (1.549 m)   Wt 226 lb 3.2 oz (102.6 kg)   SpO2 97%   BMI 42.74 kg/m     Wt Readings from Last 3 Encounters:  11/24/18 226 lb 3.2 oz (102.6 kg)  10/10/18 226 lb (102.5 kg)  10/07/18 225 lb (102.1 kg)     GEN:  Well nourished, well developed in no acute distress HEENT: Normal NECK: No JVD; No carotid bruits LYMPHATICS: No lymphadenopathy CARDIAC: RRR, no murmurs, no rubs, no gallops RESPIRATORY:  Clear to auscultation without rales, wheezing or rhonchi  ABDOMEN: Soft, non-tender, non-distended MUSCULOSKELETAL:  No edema; No deformity  SKIN: Warm and dry LOWER EXTREMITIES: no swelling NEUROLOGIC:  Alert and oriented x 3 PSYCHIATRIC:  Normal affect   ASSESSMENT:    1. Nonrheumatic mitral valve regurgitation   2. Nonsustained ventricular tachycardia (HCC)    PLAN:    In order of problems listed above:  1. Nonrheumatic mitral valve regurgitation insignificant hemodynamically stable we will continue present conservative approach. 2. Nonsustained ventricular tachycardia surprising finding.  Echocardiogram showed preserved left ventricle ejection fraction, we will schedule her to have stress test to rule ischemia as a part of stratification for ventricular tachycardia 3. Remote history of CVA so far no evidence of atrial  fibrillation.  However the fact that she has mitral regurgitation make it possible.  Will do stress test and then decide what will be next.   Medication Adjustments/Labs and Tests Ordered: Current medicines are reviewed at length with the patient today.  Concerns regarding medicines are outlined above.  No orders of the defined types were placed in this encounter.  Medication changes: No orders of the defined types were placed in this encounter.   Signed, Park Liter, MD, [facility] 11/24/2018 4:25 PM    [place]

## 2018-11-24 NOTE — Patient Instructions (Signed)
Medication Instructions:  .isntcur  *If you need a refill on your cardiac medications before your next appointment, please call your pharmacy*  Lab Work: None.  If you have labs (blood work) drawn today and your tests are completely normal, you will receive your results only by: Marland Kitchen MyChart Message (if you have MyChart) OR . A paper copy in the mail If you have any lab test that is abnormal or we need to change your treatment, we will call you to review the results.  Testing/Procedures: Your physician has requested that you have a lexiscan myoview. For further information please visit [email or web address]. Please follow instruction sheet, as given.    Follow-Up: At [facility], you and your health needs are our priority.  As part of our continuing mission to provide you with exceptional heart care, we have created designated Provider Care Teams.  These Care Teams include your primary Cardiologist (physician) and Advanced Practice Providers (APPs -  Physician Assistants and Nurse Practitioners) who all work together to provide you with the care you need, when you need it.  Your next appointment:   3 months  The format for your next appointment:   In Person  Provider:   Jenne Campus, MD  Other Instructions   Cardiac Nuclear Scan A cardiac nuclear scan is a test that measures blood flow to the heart when a person is resting and when he or she is exercising. The test looks for problems such as:  Not enough blood reaching a portion of the heart.  The heart muscle not working normally. You may need this test if:  You have heart disease.  You have had abnormal lab results.  You have had heart surgery or a balloon procedure to open up blocked arteries (angioplasty).  You have chest pain.  You have shortness of breath. In this test, a radioactive dye (tracer) is injected into your bloodstream. After the tracer has traveled to your heart, an imaging device is used to  measure how much of the tracer is absorbed by or distributed to various areas of your heart. This procedure is usually done at a hospital and takes 2-4 hours. Tell a health care provider about:  Any allergies you have.  All medicines you are taking, including vitamins, herbs, eye drops, creams, and over-the-counter medicines.  Any problems you or family members have had with anesthetic medicines.  Any blood disorders you have.  Any surgeries you have had.  Any medical conditions you have.  Whether you are pregnant or may be pregnant. What are the risks? Generally, this is a safe procedure. However, problems may occur, including:  Serious chest pain and heart attack. This is only a risk if the stress portion of the test is done.  Rapid heartbeat.  Sensation of warmth in your chest. This usually passes quickly.  Allergic reaction to the tracer. What happens before the procedure?  Ask your health care provider about changing or stopping your regular medicines. This is especially important if you are taking diabetes medicines or blood thinners.  Follow instructions from your health care provider about eating or drinking restrictions.  Remove your jewelry on the day of the procedure. What happens during the procedure?  An IV will be inserted into one of your veins.  Your health care provider will inject a small amount of radioactive tracer through the IV.  You will wait for 20-40 minutes while the tracer travels through your bloodstream.  Your heart activity will be monitored with an electrocardiogram (  ECG).  You will lie down on an exam table.  Images of your heart will be taken for about 15-20 minutes.  You may also have a stress test. For this test, one of the following may be done: ? You will exercise on a treadmill or stationary bike. While you exercise, your heart's activity will be monitored with an ECG, and your blood pressure will be checked. ? You will be given  medicines that will increase blood flow to parts of your heart. This is done if you are unable to exercise.  When blood flow to your heart has peaked, a tracer will again be injected through the IV.  After 20-40 minutes, you will get back on the exam table and have more images taken of your heart.  Depending on the type of tracer used, scans may need to be repeated 3-4 hours later.  Your IV line will be removed when the procedure is over. The procedure may vary among health care providers and hospitals. What happens after the procedure?  Unless your health care provider tells you otherwise, you may return to your normal schedule, including diet, activities, and medicines.  Unless your health care provider tells you otherwise, you may increase your fluid intake. This will help to flush the contrast dye from your body. Drink enough fluid to keep your urine pale yellow.  Ask your health care provider, or the department that is doing the test: ? When will my results be ready? ? How will I get my results? Summary  A cardiac nuclear scan measures the blood flow to the heart when a person is resting and when he or she is exercising.  Tell your health care provider if you are pregnant.  Before the procedure, ask your health care provider about changing or stopping your regular medicines. This is especially important if you are taking diabetes medicines or blood thinners.  After the procedure, unless your health care provider tells you otherwise, increase your fluid intake. This will help flush the contrast dye from your body.  After the procedure, unless your health care provider tells you otherwise, you may return to your normal schedule, including diet, activities, and medicines. This information is not intended to replace advice given to you by your health care provider. Make sure you discuss any questions you have with your health care provider. Document Released: 01/27/2004 Document  Revised: 06/17/2017 Document Reviewed: 06/17/2017 Elsevier Patient Education  2020 [employer].

## 2018-11-24 NOTE — Addendum Note (Signed)
Addended by: Ashok Norris on: 11/24/2018 04:34 PM   Modules accepted: Orders

## 2018-11-28 ENCOUNTER — Telehealth: Payer: Self-pay | Admitting: Gastroenterology

## 2018-11-28 DIAGNOSIS — R197 Diarrhea, unspecified: Secondary | ICD-10-CM

## 2018-11-28 DIAGNOSIS — Z01818 Encounter for other preprocedural examination: Secondary | ICD-10-CM

## 2018-11-28 NOTE — Telephone Encounter (Signed)
[facility] Pre-operative Risk Assessment     Request for surgical clearance:     Endoscopy Procedure  What type of surgery is being performed?     colonoscopy  When is this surgery scheduled?     TBD  What type of clearance is required ?   Pharmacy  Are there any medications that need to be held prior to surgery and how long? Xarelto (5 days)  Practice name and name of physician performing surgery?      [facility]  What is your office phone and fax number?      Phone- [phone]  [phone] Anesthesia type (None, local, MAC, general) ?       MAC

## 2018-11-28 NOTE — Telephone Encounter (Signed)
Callback staff, please clarify urgency of colonoscopy with GI team. In the interim will forward message to GI team that originated message to let them know patient is not on Xarelto but instead Plavix, and does not appear to be on this for cardiac reasons. However, she is undergoing ischemic workup for NSVT so if procedure is not urgent would consider waiting for stress test first before interrupting blood thinner.  Tiphany Fayson PA-C

## 2018-11-28 NOTE — Telephone Encounter (Signed)
I s/w pt and she confirms that she is not taking Xarelto and that she is taking Plavix which has been prescribed by her PCP. I stated to the pt that I will let the cardiologist know of our conversation and we will let her know once she has been cleared. Pt thanked me for the call.

## 2018-11-28 NOTE — Telephone Encounter (Signed)
Callback, in addition to the message below to GI, can you clarify with patient - does patient take Xarelto? She does appear to be on Plavix but does not appear this is for cardiac reasons.

## 2018-11-28 NOTE — Telephone Encounter (Signed)
Pt does not currently take Xarelto per our records - will need to clarify this with pt. Looks like there was plan in 9/25 encounter for 2 week monitor to look for any afib in setting of CVA 5 years ago, monitor results only showed nonsustained ventricular tachycardia. Do not see any indication for DOAC therapy.

## 2018-11-28 NOTE — Telephone Encounter (Signed)
   Primary Cardiologist: Jenne Campus, MD  Chart reviewed as part of pre-operative protocol coverage. Patient was contacted 11/28/2018 in reference to pre-operative risk assessment for pending surgery as outlined below.  Gloria Sharp was last seen on 11/24/18 by Dr. Agustin Cree with plan for nuclear stress test given non-sustained ventricular tachycardia.  Will first route to pharmacy team since 5 days sounds excessive.  [facility] staff, can you please discuss with GI the urgency of the procedure since nuc is not scheduled until 12/1? (Pending patient's insurance authorization per notes.)  Would anticipate we would like to have these results before clearing if it is just a routine colonoscopy. Also find out if there is a reason why 5 days was requested instead of usual 2-3. Thanks.  Charlie Pitter, PA-C 11/28/2018, 4:02 PM

## 2018-12-01 NOTE — Telephone Encounter (Signed)
I s/w [facility] and they are awaiting on further advice from Dr. Lyndel Safe.

## 2018-12-01 NOTE — Telephone Encounter (Signed)
I have faxed update to Faythe Casa, RN for [facility].

## 2018-12-01 NOTE — Telephone Encounter (Signed)
Please review previous messages and advise: urgency of GI procedure

## 2018-12-01 NOTE — Telephone Encounter (Signed)
   Primary Cardiologist:Robert Agustin Cree, MD   Chart reviewed as part of pre-operative protocol coverage. Gloria Sharp is currently being evaluated for nonsustained ventricular tachycardia. An echocardiogram showed preserved left ventricle ejection fraction, however will now undergo a stress test 12/16/2018 to rule ischemia as a part of stratification for ventricular tachycardia. Also has a hx of CVA in which she is taking Plavix directed by her neurology team. Holding recommendations will need to go through their team.    Kathyrn Drown, NP  12/01/2018, 10:33 AM

## 2018-12-02 ENCOUNTER — Telehealth: Payer: Self-pay | Admitting: Gastroenterology

## 2018-12-02 NOTE — Telephone Encounter (Signed)
Left message for patient to call back to the office- patient had called in=checking on status of being able to schedule procedure-

## 2018-12-02 NOTE — Telephone Encounter (Signed)
Please see previous documentation concerning this patient

## 2018-12-02 NOTE — Telephone Encounter (Signed)
Please route information -please do not fax information regarding clearance

## 2018-12-02 NOTE — Telephone Encounter (Signed)
Thank you for letting me know Hold off on GI work-up until cardiology work-up is complete and she is cleared from cardiac standpoint.  Thx  RG

## 2018-12-04 DIAGNOSIS — R109 Unspecified abdominal pain: Secondary | ICD-10-CM | POA: Diagnosis not present

## 2018-12-04 DIAGNOSIS — R0689 Other abnormalities of breathing: Secondary | ICD-10-CM | POA: Diagnosis not present

## 2018-12-04 DIAGNOSIS — N200 Calculus of kidney: Secondary | ICD-10-CM | POA: Diagnosis not present

## 2018-12-04 DIAGNOSIS — R079 Chest pain, unspecified: Secondary | ICD-10-CM | POA: Diagnosis not present

## 2018-12-04 DIAGNOSIS — M5489 Other dorsalgia: Secondary | ICD-10-CM | POA: Diagnosis not present

## 2018-12-04 DIAGNOSIS — R0789 Other chest pain: Secondary | ICD-10-CM | POA: Diagnosis not present

## 2018-12-04 DIAGNOSIS — R000 Tachycardia, unspecified: Secondary | ICD-10-CM | POA: Diagnosis not present

## 2018-12-05 ENCOUNTER — Telehealth: Payer: Self-pay | Admitting: Gastroenterology

## 2018-12-08 ENCOUNTER — Emergency Department (HOSPITAL_COMMUNITY): Payer: Medicare HMO

## 2018-12-08 ENCOUNTER — Emergency Department (HOSPITAL_COMMUNITY)
Admission: EM | Admit: 2018-12-08 | Discharge: 2018-12-09 | Disposition: A | Discharge status: Home / Self Care | Payer: Medicare HMO | Attending: Emergency Medicine | Admitting: Emergency Medicine

## 2018-12-08 ENCOUNTER — Encounter (HOSPITAL_COMMUNITY): Payer: Self-pay | Admitting: Emergency Medicine

## 2018-12-08 ENCOUNTER — Other Ambulatory Visit: Payer: Self-pay

## 2018-12-08 DIAGNOSIS — Z7901 Long term (current) use of anticoagulants: Secondary | ICD-10-CM | POA: Diagnosis not present

## 2018-12-08 DIAGNOSIS — Z79899 Other long term (current) drug therapy: Secondary | ICD-10-CM | POA: Diagnosis not present

## 2018-12-08 DIAGNOSIS — Z7984 Long term (current) use of oral hypoglycemic drugs: Secondary | ICD-10-CM | POA: Insufficient documentation

## 2018-12-08 DIAGNOSIS — I10 Essential (primary) hypertension: Secondary | ICD-10-CM | POA: Insufficient documentation

## 2018-12-08 DIAGNOSIS — E119 Type 2 diabetes mellitus without complications: Secondary | ICD-10-CM | POA: Diagnosis not present

## 2018-12-08 DIAGNOSIS — M5441 Lumbago with sciatica, right side: Secondary | ICD-10-CM | POA: Diagnosis not present

## 2018-12-08 DIAGNOSIS — M545 Low back pain: Secondary | ICD-10-CM | POA: Diagnosis not present

## 2018-12-08 DIAGNOSIS — M25551 Pain in right hip: Secondary | ICD-10-CM | POA: Diagnosis not present

## 2018-12-08 DIAGNOSIS — M5431 Sciatica, right side: Secondary | ICD-10-CM

## 2018-12-08 LAB — COMPREHENSIVE METABOLIC PANEL
ALT: 19 U/L (ref 0–44)
AST: 23 U/L (ref 15–41)
Albumin: 3.8 g/dL (ref 3.5–5.0)
Alkaline Phosphatase: 60 U/L (ref 38–126)
Anion gap: 11 (ref 5–15)
BUN: 20 mg/dL (ref 8–23)
CO2: 25 mmol/L (ref 22–32)
Calcium: 9.6 mg/dL (ref 8.9–10.3)
Chloride: 103 mmol/L (ref 98–111)
Creatinine, Ser: 0.96 mg/dL (ref 0.44–1.00)
GFR calc Af Amer: >60 mL/min (ref >60)
GFR calc non Af Amer: 60 mL/min — ABNORMAL LOW (ref >60)
Glucose, Bld: 140 mg/dL — ABNORMAL HIGH (ref 70–99)
Potassium: 3.6 mmol/L (ref 3.5–5.1)
Sodium: 139 mmol/L (ref 135–145)
Total Bilirubin: 0.7 mg/dL (ref 0.3–1.2)
Total Protein: 6.7 g/dL (ref 6.5–8.1)

## 2018-12-08 LAB — CBC
HCT: 35.7 % — ABNORMAL LOW (ref 36.0–46.0)
Hemoglobin: 11.4 g/dL — ABNORMAL LOW (ref 12.0–15.0)
MCH: 27.4 pg (ref 26.0–34.0)
MCHC: 31.9 g/dL (ref 30.0–36.0)
MCV: 85.8 fL (ref 80.0–100.0)
Platelets: 536 10*3/uL — ABNORMAL HIGH (ref 150–400)
RBC: 4.16 MIL/uL (ref 3.87–5.11)
RDW: 14.3 % (ref 11.5–15.5)
WBC: 14.9 10*3/uL — ABNORMAL HIGH (ref 4.0–10.5)
nRBC: 0 % (ref 0.0–0.2)

## 2018-12-08 LAB — LIPASE, BLOOD: Lipase: 32 U/L (ref 11–51)

## 2018-12-08 NOTE — Telephone Encounter (Signed)
Left message for patient to call back to the office;  

## 2018-12-08 NOTE — ED Triage Notes (Signed)
Pt reports lower right sided back pain that radiates down hip and leg since Wednesday. States she was at [facility] on Thursday and they ruled out kidney stones. They gave her meds for a pulled muscle but reports no relief. Pain is ok when sitting still but pain shoots up when moving.

## 2018-12-09 ENCOUNTER — Telehealth: Payer: Self-pay | Admitting: *Deleted

## 2018-12-09 LAB — URINALYSIS, ROUTINE W REFLEX MICROSCOPIC
Bacteria, UA: NONE SEEN
Bilirubin Urine: NEGATIVE
Glucose, UA: NEGATIVE mg/dL
Hgb urine dipstick: NEGATIVE
Ketones, ur: NEGATIVE mg/dL
Nitrite: NEGATIVE
Protein, ur: NEGATIVE mg/dL
Specific Gravity, Urine: 1.02 (ref 1.005–1.030)
pH: 5 (ref 5.0–8.0)

## 2018-12-09 MED ORDER — METHOCARBAMOL 500 MG PO TABS
500.0000 mg | ORAL_TABLET | Freq: Once | ORAL | Status: AC
  Administered 2018-12-09: 500 mg via ORAL
  Filled 2018-12-09: qty 1

## 2018-12-09 MED ORDER — HYDROCODONE-ACETAMINOPHEN 5-325 MG PO TABS: 1.0000 | ORAL_TABLET | Freq: Four times a day (QID) | ORAL | 0 refills | Status: DC | PRN

## 2018-12-09 MED ORDER — LIDOCAINE 5 % EX PTCH
1.0000 | MEDICATED_PATCH | CUTANEOUS | Status: DC
  Administered 2018-12-09: 1 via TRANSDERMAL
  Filled 2018-12-09: qty 1

## 2018-12-09 MED ORDER — HYDROCODONE-ACETAMINOPHEN 5-325 MG PO TABS
1.0000 | ORAL_TABLET | Freq: Once | ORAL | Status: AC
  Administered 2018-12-09: 1 via ORAL
  Filled 2018-12-09: qty 1

## 2018-12-09 MED ORDER — METHOCARBAMOL 500 MG PO TABS: 500.0000 mg | ORAL_TABLET | Freq: Two times a day (BID) | ORAL | 0 refills | Status: DC | PRN

## 2018-12-09 NOTE — Telephone Encounter (Signed)
Pt returned your call, pls call her again at her cell [phone].

## 2018-12-09 NOTE — Telephone Encounter (Signed)
Pt is scheduled for stress test 12/16/18

## 2018-12-09 NOTE — Discharge Instructions (Addendum)
Alternate ice and heat to areas of injury 3-4 times per day to limit inflammation and spasm.  Avoid strenuous activity and heavy lifting.  We recommend consistent use of Norco in addition to Robaxin for muscle spasms.  Do not drive or drink alcohol after taking this medication as it may make you drowsy and impair your judgment.  We recommend follow-up with a primary care doctor to ensure resolution of symptoms.  Return to the ED for any new or concerning symptoms.

## 2018-12-09 NOTE — ED Notes (Signed)
Urine culture sent with UA 

## 2018-12-09 NOTE — ED Notes (Signed)
Discharge instructions discussed with pt. Pt verbalized understanding. Pt stable and ambulatory. No signature pad available. 

## 2018-12-09 NOTE — Telephone Encounter (Signed)
Please review telephone note from 11/28/2018-please clarify if the patient is cleared for colonoscopy-she is stating you cleared her and adamant she needs to be scheduled for her procedure, however, your notation is that she is to complete the stress test on 12/16/2018;  Thank you in advance for the clarification

## 2018-12-09 NOTE — Telephone Encounter (Signed)
Patient given detailed instructions per Myocardial Perfusion Study Information Sheet for the test on 12/16/2018 at 0745. Patient notified to arrive 15 minutes early and that it is imperative to arrive on time for appointment to keep from having the test rescheduled.  If you need to cancel or reschedule your appointment, please call the office within 24 hours of your appointment. . Patient verbalized understanding.Jaymison Luber, Ranae [place]

## 2018-12-10 DIAGNOSIS — Z7689 Persons encountering health services in other specified circumstances: Secondary | ICD-10-CM | POA: Diagnosis not present

## 2018-12-10 DIAGNOSIS — S76011A Strain of muscle, fascia and tendon of right hip, initial encounter: Secondary | ICD-10-CM | POA: Diagnosis not present

## 2018-12-10 DIAGNOSIS — Z6841 Body Mass Index (BMI) 40.0 and over, adult: Secondary | ICD-10-CM | POA: Diagnosis not present

## 2018-12-10 NOTE — Telephone Encounter (Signed)
Ideally she should have a stress test done before colonoscopy.  It looks like first available stress test we have is in January I will try to see if we can do it sooner I suspect she wants to have colonoscopy done before you resolve her probably because of deductible.  If her stress test is negative then will be able to proceed with colonoscopy.

## 2018-12-15 ENCOUNTER — Telehealth: Payer: Self-pay | Admitting: Cardiology

## 2018-12-15 DIAGNOSIS — Z6841 Body Mass Index (BMI) 40.0 and over, adult: Secondary | ICD-10-CM | POA: Diagnosis not present

## 2018-12-15 DIAGNOSIS — N181 Chronic kidney disease, stage 1: Secondary | ICD-10-CM | POA: Diagnosis not present

## 2018-12-15 DIAGNOSIS — E1122 Type 2 diabetes mellitus with diabetic chronic kidney disease: Secondary | ICD-10-CM | POA: Diagnosis not present

## 2018-12-15 NOTE — Telephone Encounter (Signed)
Patient is scheduled for stress test tomorrow and she is having some symptoms and wants [place] to make sure if we are going to do before coming to office tomorrow.

## 2018-12-15 NOTE — Telephone Encounter (Addendum)
Called patient she reports she has developed some nasal congestion recently and a cough. She is wanting to know if her stress test will need to be rescheduled due to COVID 19 precautions, I informed her that she most likely will but I will confirm with Dr. Agustin Cree.

## 2018-12-15 NOTE — Telephone Encounter (Signed)
Left message for patient to return call.

## 2018-12-15 NOTE — Telephone Encounter (Signed)
Called patient back, informed her we need to reschedule the [place] and gave her new date for this.

## 2018-12-16 DIAGNOSIS — J0100 Acute maxillary sinusitis, unspecified: Secondary | ICD-10-CM | POA: Diagnosis not present

## 2018-12-16 DIAGNOSIS — Z20828 Contact with and (suspected) exposure to other viral communicable diseases: Secondary | ICD-10-CM | POA: Diagnosis not present

## 2018-12-16 DIAGNOSIS — J3489 Other specified disorders of nose and nasal sinuses: Secondary | ICD-10-CM | POA: Diagnosis not present

## 2018-12-16 DIAGNOSIS — R0981 Nasal congestion: Secondary | ICD-10-CM | POA: Diagnosis not present

## 2018-12-16 NOTE — Telephone Encounter (Signed)
Pt had called in with report of cough and nasal congestion. Due to COVID 19 precautions, her stress test has been rescheduled for 12/30/18.

## 2018-12-16 NOTE — Telephone Encounter (Signed)
I will send note to RN Faythe Casa as FYI stress test moved to 12/30/18.

## 2018-12-17 NOTE — Telephone Encounter (Signed)
RN will reassess this situation for the patient to be scheduled for procedure once cardiology has cleared patient (post stress test)

## 2018-12-17 NOTE — Telephone Encounter (Signed)
Please see additional documentation for this patient 

## 2018-12-17 NOTE — ED Provider Notes (Signed)
[facility] DEPARTMENT Provider Note   CSN: [id number] Arrival date & time: 12/08/18  1813     History   Chief Complaint Chief Complaint  Patient presents with  . Hip Pain  . Abdominal Pain    HPI Gloria Sharp is a 73 y.o. female.     73 year old female with a history of CVA, diabetes, fibromyalgia, dyslipidemia, hypertension presents to the emergency department for right sided low back pain.  Pain has been fairly constant and is aggravated with movement.  Pain will largely subside when at rest.  It radiates down to her right hip and lateral right thigh.  Was seen at [facility] for similar complaints with rule out of a kidney stone.  She had some improvement in her pain with hydrocodone, but was only given a few tablets and ran out of this medication.  Has been experiencing worsening pain over the past few days without fever, bowel or bladder incontinence, extremity numbness or paresthesias, nausea, vomiting, diarrhea, melena, hematochezia, urinary symptoms.  She is scheduled to see her primary care doctor later today.  The history is provided by the patient. No language interpreter was used.  Hip Pain Associated symptoms include abdominal pain.  Abdominal Pain   Past Medical History:  Diagnosis Date  . CVA (cerebral vascular accident) ([place])   . Diabetes ([place])   . Fibromyalgia   . GERD (gastroesophageal reflux disease)   . Hypercholesterolemia   . Hypertension   . Leukocytosis   . Obesity     Patient Active Problem List   Diagnosis Date Noted  . Mitral regurgitation 11/24/2018  . Nonsustained ventricular tachycardia ([place]) 11/24/2018  . Heart murmur 10/10/2018  . Essential hypertension 10/10/2018  . Dyslipidemia 10/10/2018  . History of CVA (cerebrovascular accident) 10/10/2018  . Type 2 diabetes mellitus without complication, without long-term current use of insulin ([place]) 10/10/2018    Past Surgical History:  Procedure Laterality Date   . ABDOMINAL HYSTERECTOMY     partial, left the ovaries  . APPENDECTOMY    . COLONOSCOPY  11/11/2013   Colonioc polyps status post polypectomy. Pancolonic diverticulosis predominately in the sigmoid colon  . DILATION AND CURETTAGE OF UTERUS    . HERNIA REPAIR     Umbilical  . vaginal polyp removal       OB History   No obstetric history on file.      Home Medications    Prior to Admission medications   Medication Sig Start Date End Date Taking? Authorizing Provider  cholestyramine (QUESTRAN) 4 g packet Take 1 packet (4 g total) by mouth daily. 10/07/18  Yes Jackquline Denmark, MD  clobetasol cream (TEMOVATE) [id number] % Apply 1 application topically as directed.   Yes [provider]  clopidogrel (PLAVIX) 75 MG tablet Take 75 mg by mouth daily.   Yes [provider]  fenofibrate (TRICOR) 145 MG tablet Take 145 mg by mouth daily.   Yes [provider]  gabapentin (NEURONTIN) 300 MG capsule Take 300 mg by mouth 2 (two) times daily.   Yes [provider]  lisinopril-hydrochlorothiazide (ZESTORETIC) 20-12.5 MG tablet Take 1 tablet by mouth daily.   Yes [provider]  meclizine (ANTIVERT) 25 MG tablet Take 25 mg by mouth 3 (three) times daily as needed for dizziness.   Yes [provider]  metFORMIN (GLUCOPHAGE) 1000 MG tablet Take 1,000 mg by mouth 2 (two) times daily with a meal.   Yes [provider]  metoprolol tartrate (LOPRESSOR)  100 MG tablet Take 100 mg by mouth at bedtime.    Yes [provider]  pantoprazole (PROTONIX) 40 MG tablet Take 40 mg by mouth daily as needed (for reflux).    Yes [provider]  pravastatin (PRAVACHOL) 20 MG tablet Take 20 mg by mouth every Sunday.    Yes [provider]  Semaglutide, 1 MG/DOSE, (OZEMPIC, 1 MG/DOSE,) 2 MG/1.5ML SOPN Inject 1 mg into the skin every Wednesday.    Yes [provider]  HYDROcodone-acetaminophen (NORCO/VICODIN) 5-325 MG tablet Take 1  tablet by mouth every 6 (six) hours as needed for severe pain. 12/09/18   Antonietta Breach, PA-C  methocarbamol (ROBAXIN) 500 MG tablet Take 1 tablet (500 mg total) by mouth every 12 (twelve) hours as needed for muscle spasms. 12/09/18   Antonietta Breach, PA-C    Family History Family History  Problem Relation Age of Onset  . Cancer Mother   . Hypertension Mother   . Stroke Mother   . Diabetes Mother   . Heart failure Mother   . Hypertension Father   . Heart disease Father   . Colon cancer Neg Hx     Social History Social History   Tobacco Use  . Smoking status: Never Smoker  . Smokeless tobacco: Never Used  Substance Use Topics  . Alcohol use: Not Currently  . Drug use: Never     Allergies   Celecoxib, Nabumetone, Ciprofloxacin, and Flagyl [metronidazole]   Review of Systems Review of Systems  Gastrointestinal: Positive for abdominal pain.  Ten systems reviewed and are negative for acute change, except as noted in the HPI.    Physical Exam Updated Vital Signs BP (!) 166/58 (BP Location: Right Arm)   Pulse 81   Temp 97.9 F (36.6 C) (Oral)   Resp 15   SpO2 97%   Physical Exam Vitals signs and nursing note reviewed.  Constitutional:      General: She is not in acute distress.    Appearance: She is well-developed. She is not diaphoretic.     Comments: Nontoxic appearing, obese female.  HENT:     Head: Normocephalic and atraumatic.  Eyes:     General: No scleral icterus.    Conjunctiva/sclera: Conjunctivae normal.  Neck:     Musculoskeletal: Normal range of motion.  Pulmonary:     Effort: Pulmonary effort is normal. No respiratory distress.  Abdominal:     Palpations: Abdomen is soft. There is no mass.     Tenderness: There is no abdominal tenderness.     Comments: No anterior abdominal TTP. Soft, no peritoneal signs or guarding.  Musculoskeletal: Normal range of motion.     Comments: TTP to the right lumbosacral paraspinal muscles extending to the posterior  right hip. No bony deformities, step-offs, crepitus to the lumbosacral midline.  Negative straight leg raise, but vaguely positive crossed straight leg raise.  Skin:    General: Skin is warm and dry.     Coloration: Skin is not pale.     Findings: No erythema or rash.  Neurological:     General: No focal deficit present.     Mental Status: She is alert and oriented to person, place, and time.     Coordination: Coordination normal.     Comments: Sensation to light touch intact in bilateral lower extremities.  Psychiatric:        Behavior: Behavior normal.      ED Treatments / Results  Labs (all labs ordered are listed, but  only abnormal results are displayed) Labs Reviewed  COMPREHENSIVE METABOLIC PANEL - Abnormal; Notable for the following components:      Result Value   Glucose, Bld 140 (*)    GFR calc non Af Amer 60 (*)    All other components within normal limits  CBC - Abnormal; Notable for the following components:   WBC 14.9 (*)    Hemoglobin 11.4 (*)    HCT 35.7 (*)    Platelets 536 (*)    All other components within normal limits  URINALYSIS, ROUTINE W REFLEX MICROSCOPIC - Abnormal; Notable for the following components:   APPearance HAZY (*)    Leukocytes,Ua TRACE (*)    All other components within normal limits  LIPASE, BLOOD    EKG None  Radiology No results found.  Procedures Procedures (including critical care time)  Medications Ordered in ED Medications  HYDROcodone-acetaminophen (NORCO/VICODIN) 5-325 MG per tablet 1 tablet (1 tablet Oral Given 12/09/18 0249)  methocarbamol (ROBAXIN) tablet 500 mg (500 mg Oral Given 12/09/18 0249)     Initial Impression / Assessment and Plan / ED Course  I have reviewed the triage vital signs and the nursing notes.  Pertinent labs & imaging results that were available during my care of the patient were reviewed by me and considered in my medical decision making (see chart for details).        73 year old  female presenting for symptoms consistent with right-sided low back pain with sciatica.  She is neurovascularly intact without red flags or signs concerning for cauda equina.    Patient expressing concern that her pain may be due to changes with her colon; however, she had reassuring CT imaging at [facility] a few days ago.  The fact that her pain is aggravated with movement also is more suggestive of musculoskeletal etiology.  I can reproduce her pain on palpation of her right low back.  She has no anterior abdominal tenderness.  No nausea, vomiting, bowel changes.  Do not feel further emergent work-up or imaging is presently indicated.  She does have follow-up scheduled today with her primary care doctor.  I have encouraged her to keep this appointment.   Will prescribe a new course of pain medication as well as muscle relaxers.  She did have some relief with pain medication following her visit to [place], but ran out of this medication in a few days.  Counseled on avoidance of strenuous activity and heavy lifting.  Her habitus is certainly not helping her pain.  Return precautions discussed and provided. Patient discharged in stable condition with no unaddressed concerns.   Final Clinical Impressions(s) / ED Diagnoses   Final diagnoses:  Sciatica of right side    ED Discharge Orders         Ordered    HYDROcodone-acetaminophen (NORCO/VICODIN) 5-325 MG tablet  Every 6 hours PRN     12/09/18 0340    methocarbamol (ROBAXIN) 500 MG tablet  Every 12 hours PRN     12/09/18 0340           Antonietta Breach, PA-C 12/17/18 0442    Orpah Greek, MD 12/17/18 [phone]

## 2018-12-23 ENCOUNTER — Telehealth: Payer: Self-pay | Admitting: *Deleted

## 2018-12-23 NOTE — Telephone Encounter (Signed)
Chart reviewed- pt is awaiting Nuclear stress test for NSVT seen on monitor before cardiology clearance for elective colonoscopy can be given.  The stress test was originally scheduled for 12/1 but was rescheduled for 12/15 after the patient developed URI symptoms.  The patient is on Plavix which was prescribed by her neurologist (h/o CVA) and cardiology will defer holding that to neurology. This pre op request will be left open in the pre op pool until her stress test is completed and reviewed.   Kerin Ransom PA-C 12/23/2018 8:36 AM

## 2018-12-23 NOTE — Telephone Encounter (Signed)
Patient given detailed instructions per Myocardial Perfusion Study Information Sheet for the test on 12/30/2018 at 0815. Patient notified to arrive 15 minutes early and that it is imperative to arrive on time for appointment to keep from having the test rescheduled.  If you need to cancel or reschedule your appointment, please call the office within 24 hours of your appointment. . Patient verbalized understanding.Jereme Loren, [place] No mychart

## 2019-01-01 NOTE — Telephone Encounter (Signed)
After reviewing the patient's chart- her myocardial perfusion scan has been rescheduled for 01/21/2019; as per previous notation from cardiology -patient will need to wait to schedule her colonoscopy until after this test;   Left message on VM for patient to call back to the GI office- once she has completed this test, cardiac clearance will need to be requested and received prior to scheduling her colonoscopy;

## 2019-01-14 ENCOUNTER — Telehealth: Payer: Self-pay | Admitting: *Deleted

## 2019-01-14 DIAGNOSIS — N181 Chronic kidney disease, stage 1: Secondary | ICD-10-CM | POA: Diagnosis not present

## 2019-01-14 DIAGNOSIS — Z6841 Body Mass Index (BMI) 40.0 and over, adult: Secondary | ICD-10-CM | POA: Diagnosis not present

## 2019-01-14 DIAGNOSIS — E1122 Type 2 diabetes mellitus with diabetic chronic kidney disease: Secondary | ICD-10-CM | POA: Diagnosis not present

## 2019-01-14 NOTE — Telephone Encounter (Signed)
Patient given detailed instructions per Myocardial Perfusion Study Information Sheet for the test on 01/21/2019 at 0815. Patient notified to arrive 15 minutes early and that it is imperative to arrive on time for appointment to keep from having the test rescheduled.  If you need to cancel or reschedule your appointment, please call the office within 24 hours of your appointment. . Patient verbalized understanding.Kellan Boehlke, [place] No mychart avaiable

## 2019-01-21 ENCOUNTER — Other Ambulatory Visit: Payer: Self-pay

## 2019-01-21 ENCOUNTER — Ambulatory Visit (INDEPENDENT_AMBULATORY_CARE_PROVIDER_SITE_OTHER): Payer: Medicare HMO

## 2019-01-21 VITALS — Ht 61.0 in | Wt 226.0 lb

## 2019-01-21 DIAGNOSIS — R002 Palpitations: Secondary | ICD-10-CM | POA: Diagnosis not present

## 2019-01-21 DIAGNOSIS — I4729 Other ventricular tachycardia: Secondary | ICD-10-CM

## 2019-01-21 DIAGNOSIS — I472 Ventricular tachycardia: Secondary | ICD-10-CM

## 2019-01-21 MED ORDER — TECHNETIUM TC 99M TETROFOSMIN IV KIT
10.9000 | PACK | Freq: Once | INTRAVENOUS | Status: AC | PRN
  Administered 2019-01-21: 10.9 via INTRAVENOUS

## 2019-01-21 MED ORDER — TECHNETIUM TC 99M TETROFOSMIN IV KIT
30.8000 | PACK | Freq: Once | INTRAVENOUS | Status: AC | PRN
  Administered 2019-01-21: 30.8 via INTRAVENOUS

## 2019-01-21 MED ORDER — REGADENOSON 0.4 MG/5ML IV SOLN
0.4000 mg | Freq: Once | INTRAVENOUS | Status: AC
  Administered 2019-01-21: 0.4 mg via INTRAVENOUS

## 2019-01-22 LAB — MYOCARDIAL PERFUSION IMAGING
LV dias vol: 76 mL (ref 46–106)
LV sys vol: 26 mL
Peak HR: 113 {beats}/min
Rest HR: 76 {beats}/min
SDS: 4
SRS: 2
SSS: 6
TID: 1.02

## 2019-01-22 NOTE — Telephone Encounter (Signed)
Request for surgical clearance:     Endoscopy Procedure  What type of surgery is being performed?     Colonoscopy  When is this surgery scheduled?     TBD  What type of clearance is required ?   Pharmacy/Medical  Are there any medications that need to be held prior to surgery and how long? Xarelto (5 days as requested by Dr. Lyndel Safe)  Practice name and name of physician performing surgery?      [facility]  What is your office phone and fax number?      Phone- [phone]  [phone] Anesthesia type (None, local, MAC, general) ?       MAC

## 2019-01-23 NOTE — Telephone Encounter (Signed)
Letter for Plavix clearance from Dr. Nyra Capes has been faxed and the office is awaiting a response prior to scheduling this patient for her procedure;

## 2019-01-23 NOTE — Telephone Encounter (Signed)
   Primary Cardiologist: Jenne Campus, MD  Chart reviewed as part of pre-operative protocol coverage. Recent stress test was low risk without ischemia, therefore Gloria Sharp would be at acceptable risk for the planned procedure without further cardiovascular testing.   As noted below, patient is not on xarelto. Additionally she is on plavix for history of CVA, therefore recommendations for holding plavix will be deferred to her neurologist.   I will route this recommendation to the requesting party via [facility] fax function and remove from pre-op pool.  Please call with questions.  Abigail Butts, PA-C 01/23/2019, 2:30 PM

## 2019-01-28 ENCOUNTER — Encounter: Payer: Self-pay | Admitting: *Deleted

## 2019-02-01 NOTE — Telephone Encounter (Signed)
Cleared from cardiology standpoint to hold Plavix and proceed with GI work-up as outlined in the last note Awaiting neurology clearance?  RG

## 2019-02-02 NOTE — Telephone Encounter (Signed)
Attempted to reach patient-unable to leave a VM as mailbox is full-will attempt to reach patient at a later date/time;   

## 2019-02-02 NOTE — Telephone Encounter (Signed)
Message received from Dr. Nyra Capes "patient can come off of Plavix by their regular protocol recommendation, restart as soon as able" This information will be scanned into chart

## 2019-02-03 NOTE — Telephone Encounter (Addendum)
Called and spoke with patient-patient has been scheduled for pre visit on 02/09/2019 at 2:30 pm; COVID screening on 02/20/2019 at 10:00 am; colonoscopy on 02/23/2019 at 10:00 am; Patient advised to call back to the office at [phone] should questions/concerns arise;  Patient verbalized understanding of information/instructions;  Please see previous messages concerning cardiology clearance and neurology Plavix hold; patient is aware of all of this information but will need additional teaching at pre visit;  Amb ref and COVID screening orders in Epic;

## 2019-02-03 NOTE — Addendum Note (Signed)
Addended by: Mohammed Kindle on: 02/03/2019 01:44 PM   Modules accepted: Orders

## 2019-02-05 NOTE — Telephone Encounter (Signed)
Cleared by Dr. Nyra Capes and cardiology Seed with colonoscopy with MiraLAX preparation as previous note.  RG

## 2019-02-09 ENCOUNTER — Other Ambulatory Visit: Payer: Self-pay

## 2019-02-09 ENCOUNTER — Ambulatory Visit (AMBULATORY_SURGERY_CENTER): Payer: Self-pay | Admitting: *Deleted

## 2019-02-09 VITALS — Temp 97.4°F | Ht 61.0 in | Wt 222.0 lb

## 2019-02-09 DIAGNOSIS — R197 Diarrhea, unspecified: Secondary | ICD-10-CM

## 2019-02-09 NOTE — Progress Notes (Signed)
Patient is here in-person for PV. Patient denies any allergies to eggs or soy. Patient denies any problems with anesthesia/sedation. Patient denies any oxygen use at home. Patient denies taking any diet/weight loss medications or blood thinners. Patient is not being treated for MRSA or C-diff. EMMI education assisgned to the patient for the procedure, this was explained and instructions given to patient. COVID-19 screening test is not needed, pt was + covid on 12/16/2018. Pt is aware that care partner will wait in the car during procedure; if they feel like they will be too hot or cold to wait in the car; they may wait in the 4 th floor lobby. Patient is aware to bring only one care partner. We want them to wear a mask (we do not have any that we can provide them), practice social distancing, and we will check their temperatures when they get here.  I did remind the patient that their care partner needs to stay in the parking lot the entire time and have a cell phone available, we will call them when the pt is ready for discharge. Patient will wear mask into building.

## 2019-02-13 DIAGNOSIS — N181 Chronic kidney disease, stage 1: Secondary | ICD-10-CM | POA: Diagnosis not present

## 2019-02-13 DIAGNOSIS — Z6841 Body Mass Index (BMI) 40.0 and over, adult: Secondary | ICD-10-CM | POA: Diagnosis not present

## 2019-02-13 DIAGNOSIS — E1122 Type 2 diabetes mellitus with diabetic chronic kidney disease: Secondary | ICD-10-CM | POA: Diagnosis not present

## 2019-02-18 ENCOUNTER — Encounter: Payer: Self-pay | Admitting: Gastroenterology

## 2019-02-20 ENCOUNTER — Ambulatory Visit (INDEPENDENT_AMBULATORY_CARE_PROVIDER_SITE_OTHER): Payer: Medicare HMO

## 2019-02-20 DIAGNOSIS — Z1159 Encounter for screening for other viral diseases: Secondary | ICD-10-CM

## 2019-02-23 ENCOUNTER — Encounter: Payer: Self-pay | Admitting: Gastroenterology

## 2019-02-23 ENCOUNTER — Ambulatory Visit (AMBULATORY_SURGERY_CENTER): Payer: Medicare HMO | Admitting: Gastroenterology

## 2019-02-23 ENCOUNTER — Other Ambulatory Visit: Payer: Self-pay

## 2019-02-23 VITALS — BP 139/61 | HR 65 | Temp 95.3°F | Resp 18 | Ht 61.0 in | Wt 222.0 lb

## 2019-02-23 DIAGNOSIS — K648 Other hemorrhoids: Secondary | ICD-10-CM

## 2019-02-23 DIAGNOSIS — R197 Diarrhea, unspecified: Secondary | ICD-10-CM | POA: Diagnosis not present

## 2019-02-23 DIAGNOSIS — I10 Essential (primary) hypertension: Secondary | ICD-10-CM | POA: Diagnosis not present

## 2019-02-23 DIAGNOSIS — E119 Type 2 diabetes mellitus without complications: Secondary | ICD-10-CM | POA: Diagnosis not present

## 2019-02-23 DIAGNOSIS — K5730 Diverticulosis of large intestine without perforation or abscess without bleeding: Secondary | ICD-10-CM

## 2019-02-23 MED ORDER — SODIUM CHLORIDE 0.9 % IV SOLN: 500.0000 mL | Freq: Once | INTRAVENOUS | Status: DC

## 2019-02-23 NOTE — Progress Notes (Signed)
Called to room to assist during endoscopic procedure.  Patient ID and intended procedure confirmed with present staff. Received instructions for my participation in the procedure from the performing physician.  

## 2019-02-23 NOTE — Patient Instructions (Signed)
Handouts on diverticulosis and hemorrhoids given to you today Await pathology results  Resume plavix as normal tomorrow 02/24/19   YOU HAD AN ENDOSCOPIC PROCEDURE TODAY AT [facility]:   Refer to the procedure report that was given to you for any specific questions about what was found during the examination.  If the procedure report does not answer your questions, please call your gastroenterologist to clarify.  If you requested that your care partner not be given the details of your procedure findings, then the procedure report has been included in a sealed envelope for you to review at your convenience later.  YOU SHOULD EXPECT: Some feelings of bloating in the abdomen. Passage of more gas than usual.  Walking can help get rid of the air that was put into your GI tract during the procedure and reduce the bloating. If you had a lower endoscopy (such as a colonoscopy or flexible sigmoidoscopy) you may notice spotting of blood in your stool or on the toilet paper. If you underwent a bowel prep for your procedure, you may not have a normal bowel movement for a few days.  Please Note:  You might notice some irritation and congestion in your nose or some drainage.  This is from the oxygen used during your procedure.  There is no need for concern and it should clear up in a day or so.  SYMPTOMS TO REPORT IMMEDIATELY:   Following lower endoscopy (colonoscopy or flexible sigmoidoscopy):  Excessive amounts of blood in the stool  Significant tenderness or worsening of abdominal pains  Swelling of the abdomen that is new, acute  Fever of 100F or higher  For urgent or emergent issues, a gastroenterologist can be reached at any hour by calling [phone].   DIET:  We do recommend a small meal at first, but then you may proceed to your regular diet.  Drink plenty of fluids but you should avoid alcoholic beverages for 24 hours.  ACTIVITY:  You should plan to take it easy for the rest  of today and you should NOT DRIVE or use heavy machinery until tomorrow (because of the sedation medicines used during the test).    FOLLOW UP: Our staff will call the number listed on your records 48-72 hours following your procedure to check on you and address any questions or concerns that you may have regarding the information given to you following your procedure. If we do not reach you, we will leave a message.  We will attempt to reach you two times.  During this call, we will ask if you have developed any symptoms of COVID 19. If you develop any symptoms (ie: fever, flu-like symptoms, shortness of breath, cough etc.) before then, please call [phone].  If you test positive for Covid 19 in the 2 weeks post procedure, please call and report this information to [place].    If any biopsies were taken you will be contacted by phone or by letter within the next 1-3 weeks.  Please call us at [phone] if you have not heard about the biopsies in 3 weeks.    SIGNATURES/CONFIDENTIALITY: You and/or your care partner have signed paperwork which will be entered into your electronic medical record.  These signatures attest to the fact that that the information above on your After Visit Summary has been reviewed and is understood.  Full responsibility of the confidentiality of this discharge information lies with you and/or your care-partner.

## 2019-02-23 NOTE — Op Note (Signed)
[place] Patient Name: Gloria Sharp Procedure Date: 02/23/2019 10:02 AM MRN: [id number] Endoscopist: Jackquline Denmark , MD Age: 74 Referring MD:  Date of Birth: 1945/11/10 Gender: Female Account #: [id number] Procedure:                Colonoscopy Indications:              Clinically significant diarrhea of unexplained                            origin Medicines:                Monitored Anesthesia Care Procedure:                Pre-Anesthesia Assessment:                           - Prior to the procedure, a History and Physical                            was performed, and patient medications and                            allergies were reviewed. The patient's tolerance of                            previous anesthesia was also reviewed. The risks                            and benefits of the procedure and the sedation                            options and risks were discussed with the patient.                            All questions were answered, and informed consent                            was obtained. Prior Anticoagulants: The patient has                            taken Plavix (clopidogrel), last dose was 5 days                            prior to procedure. ASA Grade Assessment: II - A                            patient with mild systemic disease. After reviewing                            the risks and benefits, the patient was deemed in                            satisfactory condition to undergo the procedure.  After obtaining informed consent, the colonoscope                            was passed under direct vision. Throughout the                            procedure, the patient's blood pressure, pulse, and                            oxygen saturations were monitored continuously. The                            Colonoscope was introduced through the anus and                            advanced to the 2 cm into the ileum. The                      colonoscopy was performed without difficulty. The                            patient tolerated the procedure well. The quality                            of the bowel preparation was good. The terminal                            ileum, ileocecal valve, appendiceal orifice, and                            rectum were photographed. Scope In: 10:13:40 AM Scope Out: 10:30:40 AM Scope Withdrawal Time: 0 hours 11 minutes 34 seconds  Total Procedure Duration: 0 hours 17 minutes 0 seconds  Findings:                 The colon (entire examined portion) appeared                            normal. Biopsies for histology were taken with a                            cold forceps for evaluation of microscopic colitis.                           Multiple medium-mouthed diverticula were found in                            the sigmoid colon, descending colon and few in                            ascending colon.                           Non-bleeding external and internal hemorrhoids were  found during retroflexion. The hemorrhoids were                            small.                           The terminal ileum appeared normal.                           The exam was otherwise without abnormality on                            direct and retroflexion views. Complications:            No immediate complications. Estimated Blood Loss:     Estimated blood loss: none. Impression:               -Pancolonic diverticulosis predominantly in the                            sigmoid colon.                           -Non-bleeding external and internal hemorrhoids.                           -Otherwise normal colonoscopy to TI. Recommendation:           - Patient has a contact number available for                            emergencies. The signs and symptoms of potential                            delayed complications were discussed with the                             patient. Return to normal activities tomorrow.                            Written discharge instructions were provided to the                            patient.                           - Resume previous diet.                           - Resume Plavix (clopidogrel) at prior dose                            tomorrow.                           - Await pathology results.                           - Return to GI clinic  in 4 weeks.                           - The findings and recommendations were discussed                            with the patient's family. Jackquline Denmark, MD 02/23/2019 10:36:09 AM This report has been signed electronically.

## 2019-02-23 NOTE — Progress Notes (Signed)
Report given to PACU, vss 

## 2019-02-23 NOTE — Progress Notes (Signed)
Vitals-DT Temp-JB  Pt's states no medical or surgical changes since previsit or office visit. 

## 2019-02-24 ENCOUNTER — Telehealth: Payer: Self-pay

## 2019-02-24 ENCOUNTER — Encounter: Payer: Self-pay | Admitting: Cardiology

## 2019-02-24 ENCOUNTER — Ambulatory Visit (INDEPENDENT_AMBULATORY_CARE_PROVIDER_SITE_OTHER): Payer: Medicare HMO | Admitting: Cardiology

## 2019-02-24 VITALS — BP 148/60 | HR 84 | Ht 61.0 in | Wt 221.0 lb

## 2019-02-24 DIAGNOSIS — I4729 Other ventricular tachycardia: Secondary | ICD-10-CM

## 2019-02-24 DIAGNOSIS — Z8673 Personal history of transient ischemic attack (TIA), and cerebral infarction without residual deficits: Secondary | ICD-10-CM | POA: Diagnosis not present

## 2019-02-24 DIAGNOSIS — I340 Nonrheumatic mitral (valve) insufficiency: Secondary | ICD-10-CM | POA: Diagnosis not present

## 2019-02-24 DIAGNOSIS — I10 Essential (primary) hypertension: Secondary | ICD-10-CM | POA: Diagnosis not present

## 2019-02-24 DIAGNOSIS — R197 Diarrhea, unspecified: Secondary | ICD-10-CM | POA: Diagnosis not present

## 2019-02-24 DIAGNOSIS — E785 Hyperlipidemia, unspecified: Secondary | ICD-10-CM

## 2019-02-24 DIAGNOSIS — I472 Ventricular tachycardia: Secondary | ICD-10-CM

## 2019-02-24 DIAGNOSIS — E119 Type 2 diabetes mellitus without complications: Secondary | ICD-10-CM | POA: Diagnosis not present

## 2019-02-24 NOTE — Progress Notes (Signed)
Cardiology Office Note:    Date:  02/24/2019   ID:  Gloria Sharp, DOB Jul 18, 1945, MRN [id number]  PCP:  Maryella Shivers, MD  Cardiologist:  Jenne Campus, MD    Referring MD: Maryella Shivers, MD   Chief Complaint  Patient presents with  . Follow-up    3 Months    History of Present Illness:    Gloria Sharp is a 74 y.o. female past medical history significant for CVA that happened about 5 years ago, mitral regurgitation which is mild to moderate based on last echocardiogram, nonsustained ventricular tachycardia, only 8 beats, stress test normal recently.  Today to my office for follow-up.  She is doing well.  Denies have any chest pain tightness squeezing pressure burning chest.  She is upset about her weight and she is wish to be able to lose some weight but otherwise seems to be doing well.  I brought her today to the office to talk about ventricular tachycardia and also about her stress test.  Past Medical History:  Diagnosis Date  . CVA (cerebral vascular accident) ([place]) 2015?  Marland Kitchen Diabetes ([place])   . Fibromyalgia   . GERD (gastroesophageal reflux disease)   . Hypercholesterolemia   . Hypertension   . Leukocytosis   . Obesity   . Post-operative nausea and vomiting    "hard to wake up"    Past Surgical History:  Procedure Laterality Date  . ABDOMINAL HYSTERECTOMY     partial, left the ovaries  . APPENDECTOMY    . COLONOSCOPY  11/11/2013   Colonioc polyps status post polypectomy. Pancolonic diverticulosis predominately in the sigmoid colon  . DILATION AND CURETTAGE OF UTERUS    . HERNIA REPAIR     Umbilical  . vaginal polyp removal      Current Medications: Current Meds  Medication Sig  . clobetasol cream (TEMOVATE) [id number] % Apply 1 application topically as directed.  . clopidogrel (PLAVIX) 75 MG tablet Take 75 mg by mouth daily.  . fenofibrate (TRICOR) 145 MG tablet Take 145 mg by mouth daily.  Marland Kitchen gabapentin (NEURONTIN) 300 MG capsule Take 300 mg by mouth 2  (two) times daily.  Marland Kitchen glyBURIDE (DIABETA) 5 MG tablet Take 2.5 mg by mouth at bedtime.   Marland Kitchen lisinopril-hydrochlorothiazide (ZESTORETIC) 20-12.5 MG tablet Take 1 tablet by mouth daily.  Marland Kitchen MAGNESIUM PO Take 500 mg by mouth daily.  . metFORMIN (GLUCOPHAGE) 1000 MG tablet Take 1,000 mg by mouth 2 (two) times daily with a meal.  . metoprolol tartrate (LOPRESSOR) 100 MG tablet Take 100 mg by mouth at bedtime.   . Omega-3 Fatty Acids (FISH OIL PO) Take 2,000 mg by mouth daily.  . pantoprazole (PROTONIX) 40 MG tablet Take 40 mg by mouth daily as needed (for reflux).   . pravastatin (PRAVACHOL) 20 MG tablet Take 20 mg by mouth every Sunday.   . Semaglutide, 1 MG/DOSE, (OZEMPIC, 1 MG/DOSE,) 2 MG/1.5ML SOPN Inject 1 mg into the skin every Wednesday.   . TURMERIC PO Take by mouth.     Allergies:   Celecoxib, Nabumetone, Ciprofloxacin, and Flagyl [metronidazole]   Social History   Socioeconomic History  . Marital status: Married    Spouse name: Not on file  . Number of children: Not on file  . Years of education: Not on file  . Highest education level: Not on file  Occupational History  . Not on file  Tobacco Use  . Smoking status: Never Smoker  . Smokeless tobacco: Never Used  Substance and Sexual Activity  . Alcohol use: Not Currently  . Drug use: Never  . Sexual activity: Not on file  Other Topics Concern  . Not on file  Social History Narrative  . Not on file   Social Determinants of Health   Financial Resource Strain:   . Difficulty of Paying Living Expenses: Not on file  Food Insecurity:   . Worried About Charity fundraiser in the Last Year: Not on file  . Ran Out of Food in the Last Year: Not on file  Transportation Needs:   . Lack of Transportation (Medical): Not on file  . Lack of Transportation (Non-Medical): Not on file  Physical Activity:   . Days of Exercise per Week: Not on file  . Minutes of Exercise per Session: Not on file  Stress:   . Feeling of Stress : Not on  file  Social Connections:   . Frequency of Communication with Friends and Family: Not on file  . Frequency of Social Gatherings with Friends and Family: Not on file  . Attends Religious Services: Not on file  . Active Member of Clubs or Organizations: Not on file  . Attends Archivist Meetings: Not on file  . Marital Status: Not on file     Family History: The patient's family history includes Cancer in her mother; Diabetes in her mother; Heart disease in her father; Heart failure in her mother; Hypertension in her father and mother; Stroke in her mother. There is no history of Colon cancer, Esophageal cancer, Stomach cancer, Rectal cancer, or Colon polyps. ROS:   Please see the history of present illness.    All 14 point review of systems negative except as described per history of present illness  EKGs/Labs/Other Studies Reviewed:      Recent Labs: 12/08/2018: ALT 19; BUN 20; Creatinine, Ser 0.96; Hemoglobin 11.4; Platelets 536; Potassium 3.6; Sodium 139  Recent Lipid Panel No results found for: CHOL, TRIG, HDL, CHOLHDL, VLDL, LDLCALC, LDLDIRECT  Physical Exam:    VS:  BP (!) 148/60   Pulse 84   Ht 5\' 1"  (1.549 m)   Wt 221 lb (100.2 kg)   SpO2 98%   BMI 41.76 kg/m     Wt Readings from Last 3 Encounters:  02/24/19 221 lb (100.2 kg)  02/23/19 222 lb (100.7 kg)  02/09/19 222 lb (100.7 kg)     GEN:  Well nourished, well developed in no acute distress HEENT: Normal NECK: No JVD; No carotid bruits LYMPHATICS: No lymphadenopathy CARDIAC: RRR, systolic ejection murmur grade 2/6 best heard right upper portion of the sternum, there is also holosystolic murmur best heard at the apex 2-3 over 6., no rubs, no gallops RESPIRATORY:  Clear to auscultation without rales, wheezing or rhonchi  ABDOMEN: Soft, non-tender, non-distended MUSCULOSKELETAL:  No edema; No deformity  SKIN: Warm and dry LOWER EXTREMITIES: no swelling NEUROLOGIC:  Alert and oriented x 3 PSYCHIATRIC:   Normal affect   ASSESSMENT:    1. Essential hypertension   2. Nonrheumatic mitral valve regurgitation   3. Nonsustained ventricular tachycardia ([place])   4. Dyslipidemia   5. History of CVA (cerebrovascular accident)    PLAN:    In order of problems listed above:  1. Essential hypertension blood pressure elevated but she said when she check it at home it is always good.  We talked in length about this and my opinion she need to have a little bit better medication to get her blood pressure  better controlled but she said it is fine at home she is does not want to touch it. 2. Normality mitral valve regurgitation.  Only mild to moderate hemodynamically stable continue monitoring she will require another echocardiogram and within a year after the last one. 3. Nonsustained ventricular tachycardia echocardiogram preserved ejection fraction stress test negative low risk continue beta-blockade. 4. Dyslipidemia she is on low intensity statin pravastatin.  I will call primary care physician to get her fasting lipid profile 5. History of CVA.  That happened 5 years ago we placed monitor to look for atrial fibrillation with however we did not discover any.  Talk to her about potentially having implantable loop recorder.  However she does not want it she said she is fine she has been eating well for last 5 years and she does not want to do it.  In the meantime we will continue with antiplatelet therapy in form of Plavix.   Medication Adjustments/Labs and Tests Ordered: Current medicines are reviewed at length with the patient today.  Concerns regarding medicines are outlined above.  No orders of the defined types were placed in this encounter.  Medication changes: No orders of the defined types were placed in this encounter.   Signed, Park Liter, MD, [facility] 02/24/2019 3:57 PM    Start

## 2019-02-24 NOTE — Patient Instructions (Signed)
Medication Instructions:  No medication changes *If you need a refill on your cardiac medications before your next appointment, please call your pharmacy*  Lab Work: None ordered If you have labs (blood work) drawn today and your tests are completely normal, you will receive your results only by: Marland Kitchen MyChart Message (if you have MyChart) OR . A paper copy in the mail If you have any lab test that is abnormal or we need to change your treatment, we will call you to review the results.  Testing/Procedures: None ordered  Follow-Up: At [facility], you and your health needs are our priority.  As part of our continuing mission to provide you with exceptional heart care, we have created designated Provider Care Teams.  These Care Teams include your primary Cardiologist (physician) and Advanced Practice Providers (APPs -  Physician Assistants and Nurse Practitioners) who all work together to provide you with the care you need, when you need it.  Your next appointment:   6 month(s)  The format for your next appointment:   In Person  Provider:   Jenne Campus, MD  Other Instructions NA

## 2019-02-24 NOTE — Telephone Encounter (Signed)
Attempted to reach patient-unable to leave a VM as mailbox is full-will attempt to reach patient at a later date/time;   Schedule from recall-

## 2019-02-25 ENCOUNTER — Telehealth: Payer: Self-pay

## 2019-02-25 NOTE — Telephone Encounter (Signed)
  Follow up Call-  Call back number 02/23/2019  Post procedure Call Back phone  # [phone]  Permission to leave phone message Yes  Some recent data might be hidden     Patient questions:  Do you have a fever, pain , or abdominal swelling? No. Pain Score  0 *  Have you tolerated food without any problems? Yes.    Have you been able to return to your normal activities? Yes.    Do you have any questions about your discharge instructions: Diet   No. Medications  No. Follow up visit  No.  Do you have questions or concerns about your Care? No.  Actions: * If pain score is 4 or above: No action needed, pain <4.   1. Have you developed a fever since your procedure? No  2.   Have you had an respiratory symptoms (SOB or cough) since your procedure? No  3.   Have you tested positive for COVID 19 since your procedure? No  4.   Have you had any family members/close contacts diagnosed with the COVID 19 since your procedure?  No   If yes to any of these questions please route to Joylene John, RN and Alphonsa Gin, RN.

## 2019-02-26 ENCOUNTER — Encounter: Payer: Self-pay | Admitting: Gastroenterology

## 2019-02-26 NOTE — Telephone Encounter (Signed)
Attempted to reach patient-unable to leave a VM as mailbox is full-will attempt to reach patient at a later date/time to be scheduled from recall for ROV-

## 2019-03-02 NOTE — Telephone Encounter (Signed)
Attempted to reach patient-unable to leave a VM as mailbox is full-will attempt to reach patient at a later date/time to be scheduled from recall for ROV-

## 2019-03-06 NOTE — Telephone Encounter (Signed)
Patient returned call to the office and is to be scheduled for a ROV

## 2019-03-10 DIAGNOSIS — E1169 Type 2 diabetes mellitus with other specified complication: Secondary | ICD-10-CM | POA: Diagnosis not present

## 2019-03-15 DIAGNOSIS — N181 Chronic kidney disease, stage 1: Secondary | ICD-10-CM | POA: Diagnosis not present

## 2019-03-15 DIAGNOSIS — E1122 Type 2 diabetes mellitus with diabetic chronic kidney disease: Secondary | ICD-10-CM | POA: Diagnosis not present

## 2019-03-15 DIAGNOSIS — Z6841 Body Mass Index (BMI) 40.0 and over, adult: Secondary | ICD-10-CM | POA: Diagnosis not present

## 2019-03-18 DIAGNOSIS — E782 Mixed hyperlipidemia: Secondary | ICD-10-CM | POA: Diagnosis not present

## 2019-03-18 DIAGNOSIS — E1169 Type 2 diabetes mellitus with other specified complication: Secondary | ICD-10-CM | POA: Diagnosis not present

## 2019-03-18 DIAGNOSIS — E1129 Type 2 diabetes mellitus with other diabetic kidney complication: Secondary | ICD-10-CM | POA: Diagnosis not present

## 2019-03-18 DIAGNOSIS — E1159 Type 2 diabetes mellitus with other circulatory complications: Secondary | ICD-10-CM | POA: Diagnosis not present

## 2019-03-19 DIAGNOSIS — H2513 Age-related nuclear cataract, bilateral: Secondary | ICD-10-CM | POA: Diagnosis not present

## 2019-03-19 DIAGNOSIS — E119 Type 2 diabetes mellitus without complications: Secondary | ICD-10-CM | POA: Diagnosis not present

## 2019-03-19 DIAGNOSIS — H40003 Preglaucoma, unspecified, bilateral: Secondary | ICD-10-CM | POA: Diagnosis not present

## 2019-03-24 DIAGNOSIS — Z0100 Encounter for examination of eyes and vision without abnormal findings: Secondary | ICD-10-CM | POA: Diagnosis not present

## 2019-03-25 ENCOUNTER — Ambulatory Visit: Payer: Medicare HMO | Admitting: Gastroenterology

## 2019-03-25 ENCOUNTER — Encounter: Payer: Self-pay | Admitting: Gastroenterology

## 2019-03-25 ENCOUNTER — Other Ambulatory Visit: Payer: Self-pay

## 2019-03-25 VITALS — BP 128/64 | HR 82 | Temp 97.1°F | Ht 61.0 in | Wt 223.2 lb

## 2019-03-25 DIAGNOSIS — R197 Diarrhea, unspecified: Secondary | ICD-10-CM | POA: Diagnosis not present

## 2019-03-25 DIAGNOSIS — D509 Iron deficiency anemia, unspecified: Secondary | ICD-10-CM

## 2019-03-25 MED ORDER — CHOLESTYRAMINE 4 G PO PACK: 4.0000 g | PACK | Freq: Every day | ORAL | 11 refills | Status: DC

## 2019-03-25 NOTE — Progress Notes (Signed)
Chief Complaint:   Referring Provider:  Maryella Shivers, MD      ASSESSMENT AND PLAN;   #1. Diarrhea  (much better). Neg colon to TI 02/2019 except diverticulosis, with neg random colonic bx.  Neg CT 08/2018.  Neg stool studies.  D/d includes IBS-D, postcholecystectomy diarrhea, diarrhea d/t medications (like metformin/ozempic).  #2. H/O polyps. Neg Colon 02/2019.  #3. 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  #4.  Fatty liver with normal LFTs.  No liver cirrhosis currently.  Certainly, at high risk.  Plan: - EGD with SB Bx off plavix 5 days.  She wants to wait until May 2021 - Resume Imodium as before.  She would take 2 in the morning. - Continue Cholestryamine 4g po qd, at least 2 hours before or after rest of the medications - FU after EGD. - CBC, CMP @ time of EGD. - Encouraged her to continue to reduce weight.   Addendum: Got records from [facility]: 09/15/2018: neg C Diff.  WBC count 18.6K, MCV 82, platelets 440, Hb 11.7 with left shift neutrophils 87%.  Normal CMP except glucose 155. CT AP 09/08/2018 mild generalized acute colitis, hepatic steatosis, cardiomegaly.  Patient also had CT 02/16/2018 (after hysterectomy, repair of rectal prolapse January 2020)-right inguinal hernia, fatty liver, right kidney stone.  WBC count was elevated at 12.4 with platelets 497, hemoglobin 12.2  HPI:    Gloria Sharp is a 74 y.o. female   For follow-up visit.   Had colonoscopy 02/23/2019 which was negative for colitis.  It did show pancolonic diverticulosis.  Terminal ileum was normal.  Random colonic biopsies were negative for microscopic colitis.    Diarrhea is much better.  Currently having 3-4/ BMs/day on a bad day.  No significant nocturnal symptoms.  She is also tolerating cholestyramine well.  Happy that her cholesterol is normal as well.  Had mild anemia with heme negative stools.  Advised upper GI work-up.  Had lost 10 pounds over the last 3 to 4 years.  No melena or hematochezia.   Does complain of abdominal bloating.  Does admit that metformin would cause diarrhea in the past.  Also has been given ozempic.  Denies having any upper GI symptoms including nausea, vomiting, heartburn, regurgitation odynophagia or dysphagia. Past Medical History:  Diagnosis Date  . CVA (cerebral vascular accident) ([place]) 2015?  Marland Kitchen Diabetes ([place])   . Fibromyalgia   . GERD (gastroesophageal reflux disease)   . Hypercholesterolemia   . Hypertension   . Leukocytosis   . Obesity   . Post-operative nausea and vomiting    "hard to wake up"    Past Surgical History:  Procedure Laterality Date  . ABDOMINAL HYSTERECTOMY     partial, left the ovaries  . APPENDECTOMY    . COLONOSCOPY  11/11/2013   Colonioc polyps status post polypectomy. Pancolonic diverticulosis predominately in the sigmoid colon  . DILATION AND CURETTAGE OF UTERUS    . HERNIA REPAIR     Umbilical  . vaginal polyp removal      Family History  Problem Relation Age of Onset  . Cancer Mother   . Hypertension Mother   . Stroke Mother   . Diabetes Mother   . Heart failure Mother   . Hypertension Father   . Heart disease Father   . Colon cancer Neg Hx   . Esophageal cancer Neg Hx   . Stomach cancer Neg Hx   . Rectal cancer Neg Hx   . Colon polyps Neg  Hx     Social History   Tobacco Use  . Smoking status: Never Smoker  . Smokeless tobacco: Never Used  Substance Use Topics  . Alcohol use: Not Currently  . Drug use: Never    Current Outpatient Medications  Medication Sig Dispense Refill  . clobetasol cream (TEMOVATE) [id number] % Apply 1 application topically as directed.    . clopidogrel (PLAVIX) 75 MG tablet Take 75 mg by mouth daily.    . fenofibrate (TRICOR) 145 MG tablet Take 145 mg by mouth daily.    Marland Kitchen gabapentin (NEURONTIN) 300 MG capsule Take 300 mg by mouth 2 (two) times daily.    Marland Kitchen glyBURIDE (DIABETA) 5 MG tablet Take 2.5 mg by mouth at bedtime.     Marland Kitchen lisinopril-hydrochlorothiazide (ZESTORETIC)  20-12.5 MG tablet Take 1 tablet by mouth daily.    Marland Kitchen MAGNESIUM PO Take 500 mg by mouth daily.    . meclizine (ANTIVERT) 25 MG tablet Take 25 mg by mouth 3 (three) times daily as needed for dizziness.    . metFORMIN (GLUCOPHAGE) 1000 MG tablet Take 1,000 mg by mouth 2 (two) times daily with a meal.    . metoprolol tartrate (LOPRESSOR) 100 MG tablet Take 100 mg by mouth at bedtime.     . Omega-3 Fatty Acids (FISH OIL PO) Take 2,000 mg by mouth daily.    . pantoprazole (PROTONIX) 40 MG tablet Take 40 mg by mouth daily as needed (for reflux).     . pravastatin (PRAVACHOL) 20 MG tablet Take 20 mg by mouth every Sunday.     . Semaglutide, 1 MG/DOSE, (OZEMPIC, 1 MG/DOSE,) 2 MG/1.5ML SOPN Inject 1 mg into the skin every Wednesday.     . TURMERIC PO Take by mouth.     No current facility-administered medications for this visit.    Allergies  Allergen Reactions  . Celecoxib Itching and Swelling    Swelling- whole body and turns red  . Nabumetone Itching and Swelling  . Ciprofloxacin Hives, Swelling and Rash  . Flagyl [Metronidazole] Itching, Swelling and Rash    Review of Systems:  Remarkable myalgia     Physical Exam:    BP 128/64   Pulse 82   Temp (!) 97.1 F (36.2 C)   Ht 5\' 1"  (1.549 m)   Wt 223 lb 4 oz (101.3 kg)   BMI 42.18 kg/m  Filed Weights   03/25/19 1029  Weight: 223 lb 4 oz (101.3 kg)   Constitutional:  Well-developed, in no acute distress. Psychiatric: Normal mood and affect. Behavior is normal. HEENT: Pupils normal.  Conjunctivae are normal. No scleral icterus. Neck supple.  Cardiovascular: Normal rate, regular rhythm. No edema.  Has a loud ejection systolic murmur in the aortic area. Pulmonary/chest: Effort normal and breath sounds normal. No wheezing, rales or rhonchi. Abdominal: Soft, nondistended. Nontender. Bowel sounds active throughout. There are no masses palpable.  Liver palpated 2 cm below the costal margin. Rectal:  defered Neurological: Alert and  oriented to person place and time. Skin: Skin is warm and dry. No rashes noted. Extremities-1+ edema    Carmell Austria, MD 03/25/2019, 10:52 AM  Cc: Maryella Shivers, MD

## 2019-03-25 NOTE — Patient Instructions (Signed)
If you are age 72 or older, your body mass index should be between 23-30. Your Body mass index is 42.18 kg/m. If this is out of the aforementioned range listed, please consider follow up with your Primary Care Provider.  If you are age 65 or younger, your body mass index should be between 19-25. Your Body mass index is 42.18 kg/m. If this is out of the aformentioned range listed, please consider follow up with your Primary Care Provider.   We have sent the following medications to your pharmacy for you to pick up at your convenience: Cholestyramine   Please go to the lab at [facility] ([place] You will need to go to level "B", you do not need an appointment for this. Hours available are 7:30 am - 4:30 pm.  You can have these labs done on the same day as your procedure.   It has been recommended to you by your physician that you have a(n) EGD completed. Per your request, we did not schedule the procedure(s) today. Please contact our office at [phone] should you decide to have the procedure completed. You will be scheduled for a pre-visit and procedure at that time.   Thank you,  Dr. Jackquline Denmark

## 2019-04-10 DIAGNOSIS — H401232 Low-tension glaucoma, bilateral, moderate stage: Secondary | ICD-10-CM | POA: Diagnosis not present

## 2019-04-10 DIAGNOSIS — H2513 Age-related nuclear cataract, bilateral: Secondary | ICD-10-CM | POA: Diagnosis not present

## 2019-04-15 DIAGNOSIS — E1129 Type 2 diabetes mellitus with other diabetic kidney complication: Secondary | ICD-10-CM | POA: Diagnosis not present

## 2019-04-15 DIAGNOSIS — E1159 Type 2 diabetes mellitus with other circulatory complications: Secondary | ICD-10-CM | POA: Diagnosis not present

## 2019-04-15 DIAGNOSIS — E782 Mixed hyperlipidemia: Secondary | ICD-10-CM | POA: Diagnosis not present

## 2019-04-15 DIAGNOSIS — E1169 Type 2 diabetes mellitus with other specified complication: Secondary | ICD-10-CM | POA: Diagnosis not present

## 2019-04-27 DIAGNOSIS — H401134 Primary open-angle glaucoma, bilateral, indeterminate stage: Secondary | ICD-10-CM | POA: Diagnosis not present

## 2019-05-11 DIAGNOSIS — H25812 Combined forms of age-related cataract, left eye: Secondary | ICD-10-CM | POA: Diagnosis not present

## 2019-05-11 DIAGNOSIS — Z01818 Encounter for other preprocedural examination: Secondary | ICD-10-CM | POA: Diagnosis not present

## 2019-05-11 DIAGNOSIS — H401134 Primary open-angle glaucoma, bilateral, indeterminate stage: Secondary | ICD-10-CM | POA: Diagnosis not present

## 2019-05-11 DIAGNOSIS — E119 Type 2 diabetes mellitus without complications: Secondary | ICD-10-CM | POA: Diagnosis not present

## 2019-05-15 DIAGNOSIS — E1129 Type 2 diabetes mellitus with other diabetic kidney complication: Secondary | ICD-10-CM | POA: Diagnosis not present

## 2019-05-15 DIAGNOSIS — E1169 Type 2 diabetes mellitus with other specified complication: Secondary | ICD-10-CM | POA: Diagnosis not present

## 2019-05-15 DIAGNOSIS — E782 Mixed hyperlipidemia: Secondary | ICD-10-CM | POA: Diagnosis not present

## 2019-05-15 DIAGNOSIS — I152 Hypertension secondary to endocrine disorders: Secondary | ICD-10-CM | POA: Diagnosis not present

## 2019-06-02 DIAGNOSIS — H2512 Age-related nuclear cataract, left eye: Secondary | ICD-10-CM | POA: Diagnosis not present

## 2019-06-02 DIAGNOSIS — H25812 Combined forms of age-related cataract, left eye: Secondary | ICD-10-CM | POA: Diagnosis not present

## 2019-06-15 DIAGNOSIS — E1129 Type 2 diabetes mellitus with other diabetic kidney complication: Secondary | ICD-10-CM | POA: Diagnosis not present

## 2019-06-15 DIAGNOSIS — E782 Mixed hyperlipidemia: Secondary | ICD-10-CM | POA: Diagnosis not present

## 2019-06-15 DIAGNOSIS — I152 Hypertension secondary to endocrine disorders: Secondary | ICD-10-CM | POA: Diagnosis not present

## 2019-06-15 DIAGNOSIS — E1169 Type 2 diabetes mellitus with other specified complication: Secondary | ICD-10-CM | POA: Diagnosis not present

## 2019-06-17 DIAGNOSIS — H25811 Combined forms of age-related cataract, right eye: Secondary | ICD-10-CM | POA: Diagnosis not present

## 2019-07-02 DIAGNOSIS — Z5329 Procedure and treatment not carried out because of patient's decision for other reasons: Secondary | ICD-10-CM | POA: Diagnosis not present

## 2019-07-02 DIAGNOSIS — R21 Rash and other nonspecific skin eruption: Secondary | ICD-10-CM | POA: Diagnosis not present

## 2019-07-03 DIAGNOSIS — E1122 Type 2 diabetes mellitus with diabetic chronic kidney disease: Secondary | ICD-10-CM | POA: Diagnosis not present

## 2019-07-03 DIAGNOSIS — Z6841 Body Mass Index (BMI) 40.0 and over, adult: Secondary | ICD-10-CM | POA: Diagnosis not present

## 2019-07-03 DIAGNOSIS — L239 Allergic contact dermatitis, unspecified cause: Secondary | ICD-10-CM | POA: Diagnosis not present

## 2019-07-03 DIAGNOSIS — N181 Chronic kidney disease, stage 1: Secondary | ICD-10-CM | POA: Diagnosis not present

## 2019-07-10 DIAGNOSIS — I152 Hypertension secondary to endocrine disorders: Secondary | ICD-10-CM | POA: Diagnosis not present

## 2019-07-10 DIAGNOSIS — Z136 Encounter for screening for cardiovascular disorders: Secondary | ICD-10-CM | POA: Diagnosis not present

## 2019-07-10 DIAGNOSIS — Z0000 Encounter for general adult medical examination without abnormal findings: Secondary | ICD-10-CM | POA: Diagnosis not present

## 2019-07-10 DIAGNOSIS — E1159 Type 2 diabetes mellitus with other circulatory complications: Secondary | ICD-10-CM | POA: Diagnosis not present

## 2019-07-10 DIAGNOSIS — Z139 Encounter for screening, unspecified: Secondary | ICD-10-CM | POA: Diagnosis not present

## 2019-07-10 DIAGNOSIS — Z1331 Encounter for screening for depression: Secondary | ICD-10-CM | POA: Diagnosis not present

## 2019-07-10 DIAGNOSIS — Z1339 Encounter for screening examination for other mental health and behavioral disorders: Secondary | ICD-10-CM | POA: Diagnosis not present

## 2019-07-10 DIAGNOSIS — Z6841 Body Mass Index (BMI) 40.0 and over, adult: Secondary | ICD-10-CM | POA: Diagnosis not present

## 2019-07-10 DIAGNOSIS — E1169 Type 2 diabetes mellitus with other specified complication: Secondary | ICD-10-CM | POA: Diagnosis not present

## 2019-07-15 DIAGNOSIS — E1159 Type 2 diabetes mellitus with other circulatory complications: Secondary | ICD-10-CM | POA: Diagnosis not present

## 2019-07-15 DIAGNOSIS — I152 Hypertension secondary to endocrine disorders: Secondary | ICD-10-CM | POA: Diagnosis not present

## 2019-07-15 DIAGNOSIS — Z6841 Body Mass Index (BMI) 40.0 and over, adult: Secondary | ICD-10-CM | POA: Diagnosis not present

## 2019-07-24 DIAGNOSIS — E1169 Type 2 diabetes mellitus with other specified complication: Secondary | ICD-10-CM | POA: Diagnosis not present

## 2019-07-24 DIAGNOSIS — E1129 Type 2 diabetes mellitus with other diabetic kidney complication: Secondary | ICD-10-CM | POA: Diagnosis not present

## 2019-07-24 DIAGNOSIS — E1159 Type 2 diabetes mellitus with other circulatory complications: Secondary | ICD-10-CM | POA: Diagnosis not present

## 2019-07-24 DIAGNOSIS — E782 Mixed hyperlipidemia: Secondary | ICD-10-CM | POA: Diagnosis not present

## 2019-08-16 DIAGNOSIS — E782 Mixed hyperlipidemia: Secondary | ICD-10-CM | POA: Diagnosis not present

## 2019-08-16 DIAGNOSIS — E1169 Type 2 diabetes mellitus with other specified complication: Secondary | ICD-10-CM | POA: Diagnosis not present

## 2019-08-16 DIAGNOSIS — E1129 Type 2 diabetes mellitus with other diabetic kidney complication: Secondary | ICD-10-CM | POA: Diagnosis not present

## 2019-08-16 DIAGNOSIS — I152 Hypertension secondary to endocrine disorders: Secondary | ICD-10-CM | POA: Diagnosis not present

## 2019-08-24 DIAGNOSIS — R0981 Nasal congestion: Secondary | ICD-10-CM | POA: Diagnosis not present

## 2019-08-24 DIAGNOSIS — J209 Acute bronchitis, unspecified: Secondary | ICD-10-CM | POA: Diagnosis not present

## 2019-08-24 DIAGNOSIS — R05 Cough: Secondary | ICD-10-CM | POA: Diagnosis not present

## 2019-08-24 DIAGNOSIS — Z20828 Contact with and (suspected) exposure to other viral communicable diseases: Secondary | ICD-10-CM | POA: Diagnosis not present

## 2019-09-04 ENCOUNTER — Other Ambulatory Visit: Payer: Self-pay

## 2019-09-04 ENCOUNTER — Ambulatory Visit: Payer: Medicare HMO | Admitting: Cardiology

## 2019-09-04 ENCOUNTER — Encounter: Payer: Self-pay | Admitting: Cardiology

## 2019-09-04 VITALS — BP 154/72 | HR 78 | Ht 61.0 in | Wt 221.8 lb

## 2019-09-04 DIAGNOSIS — E785 Hyperlipidemia, unspecified: Secondary | ICD-10-CM

## 2019-09-04 DIAGNOSIS — I472 Ventricular tachycardia: Secondary | ICD-10-CM

## 2019-09-04 DIAGNOSIS — I10 Essential (primary) hypertension: Secondary | ICD-10-CM

## 2019-09-04 DIAGNOSIS — I350 Nonrheumatic aortic (valve) stenosis: Secondary | ICD-10-CM

## 2019-09-04 DIAGNOSIS — I340 Nonrheumatic mitral (valve) insufficiency: Secondary | ICD-10-CM | POA: Diagnosis not present

## 2019-09-04 DIAGNOSIS — I4729 Other ventricular tachycardia: Secondary | ICD-10-CM

## 2019-09-04 HISTORY — DX: Nonrheumatic aortic (valve) stenosis: I35.0

## 2019-09-04 MED ORDER — METOPROLOL SUCCINATE ER 100 MG PO TB24: 100.0000 mg | ORAL_TABLET | Freq: Every day | ORAL | 1 refills | Status: DC

## 2019-09-04 NOTE — Addendum Note (Signed)
Addended by: Ashok Norris on: 09/04/2019 02:26 PM   Modules accepted: Orders

## 2019-09-04 NOTE — Progress Notes (Signed)
Cardiology Office Note:    Date:  09/04/2019   ID:  Gloria Sharp, DOB 02-27-1945, MRN [id number]  PCP:  Maryella Shivers, MD  Cardiologist:  Jenne Campus, MD    Referring MD: Maryella Shivers, MD   No chief complaint on file. Doing well  History of Present Illness:    Gloria Sharp is a 74 y.o. 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  Comes today to my office for follow-up.  Overall doing well described to have some exertional shortness of breath but is not overwhelming.  She said overall she is doing well.  Described also to have some swelling of lower extremities sometimes at evening time.  Only mild.  No dizziness no passing out no palpitations no chest pain  Past Medical History:  Diagnosis Date  . CVA (cerebral vascular accident) ([place]) 2015?  Marland Kitchen Diabetes ([place])   . Fibromyalgia   . GERD (gastroesophageal reflux disease)   . Hypercholesterolemia   . Hypertension   . Leukocytosis   . Obesity   . Post-operative nausea and vomiting    "hard to wake up"    Past Surgical History:  Procedure Laterality Date  . ABDOMINAL HYSTERECTOMY     partial, left the ovaries  . APPENDECTOMY    . COLONOSCOPY  11/11/2013   Colonioc polyps status post polypectomy. Pancolonic diverticulosis predominately in the sigmoid colon  . DILATION AND CURETTAGE OF UTERUS    . HERNIA REPAIR     Umbilical  . vaginal polyp removal      Current Medications: Current Meds  Medication Sig  . brimonidine (ALPHAGAN) 0.15 % ophthalmic solution Place 1 drop into both eyes 2 (two) times daily.  . cholestyramine (QUESTRAN) 4 g packet Take 1 packet (4 g total) by mouth daily. Take at least 2 hours before or after rest of the medications  . clobetasol cream (TEMOVATE) 7.34 % Apply 1 application topically as directed.  . clopidogrel (PLAVIX)  75 MG tablet Take 75 mg by mouth daily.  . fenofibrate (TRICOR) 145 MG tablet Take 145 mg by mouth daily.  Marland Kitchen gabapentin (NEURONTIN) 300 MG capsule Take 300 mg by mouth 2 (two) times daily.  Marland Kitchen glyBURIDE (DIABETA) 5 MG tablet Take 2.5 mg by mouth at bedtime.   Marland Kitchen lisinopril-hydrochlorothiazide (ZESTORETIC) 20-12.5 MG tablet Take 1 tablet by mouth daily.  Marland Kitchen MAGNESIUM PO Take 500 mg by mouth daily.  . meclizine (ANTIVERT) 25 MG tablet Take 25 mg by mouth 3 (three) times daily as needed for dizziness.  . metFORMIN (GLUCOPHAGE) 1000 MG tablet Take 1,000 mg by mouth 2 (two) times daily with a meal.  . metoprolol tartrate (LOPRESSOR) 100 MG tablet Take 100 mg by mouth at bedtime.   . Omega-3 Fatty Acids (FISH OIL PO) Take 2,000 mg by mouth daily.  . pantoprazole (PROTONIX) 40 MG tablet Take 40 mg by mouth daily as needed (for reflux).   . pravastatin (PRAVACHOL) 20 MG tablet Take 20 mg by mouth every Sunday.   . Semaglutide, 1 MG/DOSE, (OZEMPIC, 1 MG/DOSE,) 2 MG/1.5ML SOPN Inject 1 mg into the skin every Wednesday.   . TURMERIC PO Take by mouth.     Allergies:   Celecoxib, Nabumetone, Ciprofloxacin, and Flagyl [metronidazole]   Social History   Socioeconomic History  . Marital status: Married    Spouse name: Not on file  . Number of children: Not  on file  . Years of education: Not on file  . Highest education level: Not on file  Occupational History  . Not on file  Tobacco Use  . Smoking status: Never Smoker  . Smokeless tobacco: Never Used  Vaping Use  . Vaping Use: Never used  Substance and Sexual Activity  . Alcohol use: Not Currently  . Drug use: Never  . Sexual activity: Not on file  Other Topics Concern  . Not on file  Social History Narrative  . Not on file   Social Determinants of Health   Financial Resource Strain:   . Difficulty of Paying Living Expenses: Not on file  Food Insecurity:   . Worried About Charity fundraiser in the Last Year: Not on file  . Ran Out of  Food in the Last Year: Not on file  Transportation Needs:   . Lack of Transportation (Medical): Not on file  . Lack of Transportation (Non-Medical): Not on file  Physical Activity:   . Days of Exercise per Week: Not on file  . Minutes of Exercise per Session: Not on file  Stress:   . Feeling of Stress : Not on file  Social Connections:   . Frequency of Communication with Friends and Family: Not on file  . Frequency of Social Gatherings with Friends and Family: Not on file  . Attends Religious Services: Not on file  . Active Member of Clubs or Organizations: Not on file  . Attends Archivist Meetings: Not on file  . Marital Status: Not on file     Family History: The patient's family history includes Cancer in her mother; Diabetes in her mother; Heart disease in her father; Heart failure in her mother; Hypertension in her father and mother; Stroke in her mother. There is no history of Colon cancer, Esophageal cancer, Stomach cancer, Rectal cancer, or Colon polyps. ROS:   Please see the history of present illness.    All 14 point review of systems negative except as described per history of present illness  EKGs/Labs/Other Studies Reviewed:      Recent Labs: 12/08/2018: ALT 19; BUN 20; Creatinine, Ser 0.96; Hemoglobin 11.4; Platelets 536; Potassium 3.6; Sodium 139  Recent Lipid Panel No results found for: CHOL, TRIG, HDL, CHOLHDL, VLDL, LDLCALC, LDLDIRECT  Physical Exam:    VS:  BP (!) 154/72   Pulse 78   Ht 5\' 1"  (1.549 m)   Wt 221 lb 12.8 oz (100.6 kg)   SpO2 98%   BMI 41.91 kg/m     Wt Readings from Last 3 Encounters:  09/04/19 221 lb 12.8 oz (100.6 kg)  03/25/19 223 lb 4 oz (101.3 kg)  02/24/19 221 lb (100.2 kg)     GEN:  Well nourished, well developed in no acute distress HEENT: Normal NECK: No JVD; No carotid bruits LYMPHATICS: No lymphadenopathy CARDIAC: RRR, systolic ejection murmur grade 3/6 best heard right upper portion of the sternum, there is  also holosystolic murmur grade 2/6 best heard at the apex, no rubs, no gallops RESPIRATORY:  Clear to auscultation without rales, wheezing or rhonchi  ABDOMEN: Soft, non-tender, non-distended MUSCULOSKELETAL:  No edema; No deformity  SKIN: Warm and dry LOWER EXTREMITIES: no swelling NEUROLOGIC:  Alert and oriented x 3 PSYCHIATRIC:  Normal affect   ASSESSMENT:    1. Nonsustained ventricular tachycardia (HCC)   2. Nonrheumatic aortic valve stenosis   3. Nonrheumatic mitral valve regurgitation   4. Essential hypertension   5. Dyslipidemia  PLAN:    In order of problems listed above:  1. Nonsustained ventricular tachycardia no symptoms stress that showed no evidence of ischemia echocardiogram showed preserved ejection fraction, continue with beta-blockade, I will switch her short acting form of metoprolol long acting form of metoprolol metoprolol succinate. 2. Aortic stenosis with mitral regurgitation, we will schedule her to have echocardiogram to reassess the lesion. 3. Essential hypertension seems to be well controlled continue present management. 4. Dyslipidemia: Followed by internal medicine team will call them to get report of her fasting lipid profile.   Medication Adjustments/Labs and Tests Ordered: Current medicines are reviewed at length with the patient today.  Concerns regarding medicines are outlined above.  Orders Placed This Encounter  Procedures  . ECHOCARDIOGRAM COMPLETE   Medication changes: No orders of the defined types were placed in this encounter.   Signed, Park Liter, MD, [facility] 09/04/2019 2:19 PM    [place]

## 2019-09-04 NOTE — Patient Instructions (Addendum)
Medication Instructions:  Your physician has recommended you make the following change in your medication:   STOP: Metoprolol tartrate    START: Metoprolol succinate 100 mg daily   *If you need a refill on your cardiac medications before your next appointment, please call your pharmacy*   Lab Work: None.  If you have labs (blood work) drawn today and your tests are completely normal, you will receive your results only by: Marland Kitchen MyChart Message (if you have MyChart) OR . A paper copy in the mail If you have any lab test that is abnormal or we need to change your treatment, we will call you to review the results.   Testing/Procedures: Your physician has requested that you have an echocardiogram. Echocardiography is a painless test that uses sound waves to create images of your heart. It provides your doctor with information about the size and shape of your heart and how well your heart's chambers and valves are working. This procedure takes approximately one hour. There are no restrictions for this procedure.     Follow-Up: At [facility], you and your health needs are our priority.  As part of our continuing mission to provide you with exceptional heart care, we have created designated Provider Care Teams.  These Care Teams include your primary Cardiologist (physician) and Advanced Practice Providers (APPs -  Physician Assistants and Nurse Practitioners) who all work together to provide you with the care you need, when you need it.  We recommend signing up for the patient portal called "MyChart".  Sign up information is provided on this After Visit Summary.  MyChart is used to connect with patients for Virtual Visits (Telemedicine).  Patients are able to view lab/test results, encounter notes, upcoming appointments, etc.  Non-urgent messages can be sent to your provider as well.   To learn more about what you can do with MyChart, go to [email or web address].    Your next appointment:    6 month(s)  The format for your next appointment:   In Person  Provider:   Jenne Campus, MD   Other Instructions   Echocardiogram An echocardiogram is a procedure that uses painless sound waves (ultrasound) to produce an image of the heart. Images from an echocardiogram can provide important information about:  Signs of coronary artery disease (CAD).  Aneurysm detection. An aneurysm is a weak or damaged part of an artery wall that bulges out from the normal force of blood pumping through the body.  Heart size and shape. Changes in the size or shape of the heart can be associated with certain conditions, including heart failure, aneurysm, and CAD.  Heart muscle function.  Heart valve function.  Signs of a past heart attack.  Fluid buildup around the heart.  Thickening of the heart muscle.  A tumor or infectious growth around the heart valves. Tell a health care provider about:  Any allergies you have.  All medicines you are taking, including vitamins, herbs, eye drops, creams, and over-the-counter medicines.  Any blood disorders you have.  Any surgeries you have had.  Any medical conditions you have.  Whether you are pregnant or may be pregnant. What are the risks? Generally, this is a safe procedure. However, problems may occur, including:  Allergic reaction to dye (contrast) that may be used during the procedure. What happens before the procedure? No specific preparation is needed. You may eat and drink normally. What happens during the procedure?   An IV tube may be inserted into one of your  veins.  You may receive contrast through this tube. A contrast is an injection that improves the quality of the pictures from your heart.  A gel will be applied to your chest.  A wand-like tool (transducer) will be moved over your chest. The gel will help to transmit the sound waves from the transducer.  The sound waves will harmlessly bounce off of your heart  to allow the heart images to be captured in real-time motion. The images will be recorded on a computer. The procedure may vary among health care providers and hospitals. What happens after the procedure?  You may return to your normal, everyday life, including diet, activities, and medicines, unless your health care provider tells you not to do that. Summary  An echocardiogram is a procedure that uses painless sound waves (ultrasound) to produce an image of the heart.  Images from an echocardiogram can provide important information about the size and shape of your heart, heart muscle function, heart valve function, and fluid buildup around your heart.  You do not need to do anything to prepare before this procedure. You may eat and drink normally.  After the echocardiogram is completed, you may return to your normal, everyday life, unless your health care provider tells you not to do that. This information is not intended to replace advice given to you by your health care provider. Make sure you discuss any questions you have with your health care provider. Document Revised: 04/24/2018 Document Reviewed: 02/04/2016 Elsevier Patient Education  [place].

## 2019-09-15 DIAGNOSIS — I152 Hypertension secondary to endocrine disorders: Secondary | ICD-10-CM | POA: Diagnosis not present

## 2019-09-15 DIAGNOSIS — E1169 Type 2 diabetes mellitus with other specified complication: Secondary | ICD-10-CM | POA: Diagnosis not present

## 2019-09-15 DIAGNOSIS — E1129 Type 2 diabetes mellitus with other diabetic kidney complication: Secondary | ICD-10-CM | POA: Diagnosis not present

## 2019-09-15 DIAGNOSIS — E782 Mixed hyperlipidemia: Secondary | ICD-10-CM | POA: Diagnosis not present

## 2019-09-16 DIAGNOSIS — E1129 Type 2 diabetes mellitus with other diabetic kidney complication: Secondary | ICD-10-CM | POA: Diagnosis not present

## 2019-09-16 DIAGNOSIS — I152 Hypertension secondary to endocrine disorders: Secondary | ICD-10-CM | POA: Diagnosis not present

## 2019-09-16 DIAGNOSIS — E782 Mixed hyperlipidemia: Secondary | ICD-10-CM | POA: Diagnosis not present

## 2019-09-16 DIAGNOSIS — E1169 Type 2 diabetes mellitus with other specified complication: Secondary | ICD-10-CM | POA: Diagnosis not present

## 2019-09-28 ENCOUNTER — Ambulatory Visit (INDEPENDENT_AMBULATORY_CARE_PROVIDER_SITE_OTHER): Payer: Medicare HMO

## 2019-09-28 ENCOUNTER — Other Ambulatory Visit: Payer: Self-pay

## 2019-09-28 DIAGNOSIS — I350 Nonrheumatic aortic (valve) stenosis: Secondary | ICD-10-CM

## 2019-09-28 LAB — ECHOCARDIOGRAM COMPLETE
AR max vel: 0.92 cm2
AV Area VTI: 0.89 cm2
AV Area mean vel: 1 cm2
AV Mean grad: 18 mmHg
AV Peak grad: 34.7 mmHg
Ao pk vel: 2.95 m/s
Area-P 1/2: 2.69 cm2
S' Lateral: 3.3 cm

## 2019-09-28 NOTE — Progress Notes (Signed)
Complete echocardiogram has been performed.  [name] RDCS, RVT 

## 2019-10-02 ENCOUNTER — Encounter: Payer: Self-pay | Admitting: Emergency Medicine

## 2019-10-12 ENCOUNTER — Telehealth: Payer: Self-pay | Admitting: Cardiology

## 2019-10-12 NOTE — Telephone Encounter (Signed)
Patient returning call for echo results. 

## 2019-10-12 NOTE — Telephone Encounter (Signed)
Patient informed of results.  

## 2019-10-16 DIAGNOSIS — E1169 Type 2 diabetes mellitus with other specified complication: Secondary | ICD-10-CM | POA: Diagnosis not present

## 2019-10-16 DIAGNOSIS — I152 Hypertension secondary to endocrine disorders: Secondary | ICD-10-CM | POA: Diagnosis not present

## 2019-10-16 DIAGNOSIS — E1129 Type 2 diabetes mellitus with other diabetic kidney complication: Secondary | ICD-10-CM | POA: Diagnosis not present

## 2019-10-16 DIAGNOSIS — E782 Mixed hyperlipidemia: Secondary | ICD-10-CM | POA: Diagnosis not present

## 2019-11-16 DIAGNOSIS — I152 Hypertension secondary to endocrine disorders: Secondary | ICD-10-CM | POA: Diagnosis not present

## 2019-11-16 DIAGNOSIS — E1129 Type 2 diabetes mellitus with other diabetic kidney complication: Secondary | ICD-10-CM | POA: Diagnosis not present

## 2019-11-16 DIAGNOSIS — I872 Venous insufficiency (chronic) (peripheral): Secondary | ICD-10-CM | POA: Diagnosis not present

## 2019-11-16 DIAGNOSIS — Z6841 Body Mass Index (BMI) 40.0 and over, adult: Secondary | ICD-10-CM | POA: Diagnosis not present

## 2019-11-16 DIAGNOSIS — E1169 Type 2 diabetes mellitus with other specified complication: Secondary | ICD-10-CM | POA: Diagnosis not present

## 2019-11-16 DIAGNOSIS — E782 Mixed hyperlipidemia: Secondary | ICD-10-CM | POA: Diagnosis not present

## 2019-11-25 DIAGNOSIS — E1169 Type 2 diabetes mellitus with other specified complication: Secondary | ICD-10-CM | POA: Diagnosis not present

## 2019-12-02 DIAGNOSIS — N181 Chronic kidney disease, stage 1: Secondary | ICD-10-CM | POA: Diagnosis not present

## 2019-12-02 DIAGNOSIS — E1159 Type 2 diabetes mellitus with other circulatory complications: Secondary | ICD-10-CM | POA: Diagnosis not present

## 2019-12-02 DIAGNOSIS — E1122 Type 2 diabetes mellitus with diabetic chronic kidney disease: Secondary | ICD-10-CM | POA: Diagnosis not present

## 2019-12-02 DIAGNOSIS — Z23 Encounter for immunization: Secondary | ICD-10-CM | POA: Diagnosis not present

## 2019-12-16 DIAGNOSIS — I152 Hypertension secondary to endocrine disorders: Secondary | ICD-10-CM | POA: Diagnosis not present

## 2019-12-16 DIAGNOSIS — E1159 Type 2 diabetes mellitus with other circulatory complications: Secondary | ICD-10-CM | POA: Diagnosis not present

## 2019-12-16 DIAGNOSIS — N181 Chronic kidney disease, stage 1: Secondary | ICD-10-CM | POA: Diagnosis not present

## 2019-12-16 DIAGNOSIS — E1122 Type 2 diabetes mellitus with diabetic chronic kidney disease: Secondary | ICD-10-CM | POA: Diagnosis not present

## 2020-01-16 DIAGNOSIS — I152 Hypertension secondary to endocrine disorders: Secondary | ICD-10-CM | POA: Diagnosis not present

## 2020-01-16 DIAGNOSIS — N181 Chronic kidney disease, stage 1: Secondary | ICD-10-CM | POA: Diagnosis not present

## 2020-01-16 DIAGNOSIS — E1122 Type 2 diabetes mellitus with diabetic chronic kidney disease: Secondary | ICD-10-CM | POA: Diagnosis not present

## 2020-01-16 DIAGNOSIS — E1159 Type 2 diabetes mellitus with other circulatory complications: Secondary | ICD-10-CM | POA: Diagnosis not present

## 2020-02-09 DIAGNOSIS — L300 Nummular dermatitis: Secondary | ICD-10-CM | POA: Diagnosis not present

## 2020-02-09 DIAGNOSIS — L299 Pruritus, unspecified: Secondary | ICD-10-CM | POA: Diagnosis not present

## 2020-02-16 DIAGNOSIS — I152 Hypertension secondary to endocrine disorders: Secondary | ICD-10-CM | POA: Diagnosis not present

## 2020-02-16 DIAGNOSIS — E1122 Type 2 diabetes mellitus with diabetic chronic kidney disease: Secondary | ICD-10-CM | POA: Diagnosis not present

## 2020-02-16 DIAGNOSIS — N181 Chronic kidney disease, stage 1: Secondary | ICD-10-CM | POA: Diagnosis not present

## 2020-02-16 DIAGNOSIS — E1159 Type 2 diabetes mellitus with other circulatory complications: Secondary | ICD-10-CM | POA: Diagnosis not present

## 2020-02-23 ENCOUNTER — Other Ambulatory Visit: Payer: Self-pay | Admitting: Cardiology

## 2020-02-25 ENCOUNTER — Other Ambulatory Visit: Payer: Self-pay

## 2020-02-25 DIAGNOSIS — M797 Fibromyalgia: Secondary | ICD-10-CM | POA: Insufficient documentation

## 2020-02-25 DIAGNOSIS — I639 Cerebral infarction, unspecified: Secondary | ICD-10-CM | POA: Insufficient documentation

## 2020-02-25 DIAGNOSIS — R112 Nausea with vomiting, unspecified: Secondary | ICD-10-CM | POA: Insufficient documentation

## 2020-02-25 DIAGNOSIS — D72829 Elevated white blood cell count, unspecified: Secondary | ICD-10-CM | POA: Insufficient documentation

## 2020-02-25 DIAGNOSIS — E119 Type 2 diabetes mellitus without complications: Secondary | ICD-10-CM | POA: Insufficient documentation

## 2020-02-25 DIAGNOSIS — E669 Obesity, unspecified: Secondary | ICD-10-CM | POA: Insufficient documentation

## 2020-02-25 DIAGNOSIS — E7800 Pure hypercholesterolemia, unspecified: Secondary | ICD-10-CM | POA: Insufficient documentation

## 2020-02-25 DIAGNOSIS — K219 Gastro-esophageal reflux disease without esophagitis: Secondary | ICD-10-CM | POA: Insufficient documentation

## 2020-02-25 DIAGNOSIS — I10 Essential (primary) hypertension: Secondary | ICD-10-CM | POA: Insufficient documentation

## 2020-03-08 ENCOUNTER — Encounter: Payer: Self-pay | Admitting: Cardiology

## 2020-03-08 ENCOUNTER — Ambulatory Visit: Payer: Medicare HMO | Admitting: Cardiology

## 2020-03-08 ENCOUNTER — Other Ambulatory Visit: Payer: Self-pay

## 2020-03-08 VITALS — BP 156/78 | HR 70 | Ht 60.0 in | Wt 222.0 lb

## 2020-03-08 DIAGNOSIS — I350 Nonrheumatic aortic (valve) stenosis: Secondary | ICD-10-CM

## 2020-03-08 DIAGNOSIS — Z1231 Encounter for screening mammogram for malignant neoplasm of breast: Secondary | ICD-10-CM | POA: Diagnosis not present

## 2020-03-08 DIAGNOSIS — I340 Nonrheumatic mitral (valve) insufficiency: Secondary | ICD-10-CM

## 2020-03-08 DIAGNOSIS — Z8673 Personal history of transient ischemic attack (TIA), and cerebral infarction without residual deficits: Secondary | ICD-10-CM

## 2020-03-08 DIAGNOSIS — I472 Ventricular tachycardia: Secondary | ICD-10-CM

## 2020-03-08 DIAGNOSIS — I10 Essential (primary) hypertension: Secondary | ICD-10-CM | POA: Diagnosis not present

## 2020-03-08 DIAGNOSIS — I4729 Other ventricular tachycardia: Secondary | ICD-10-CM

## 2020-03-08 DIAGNOSIS — E7800 Pure hypercholesterolemia, unspecified: Secondary | ICD-10-CM | POA: Diagnosis not present

## 2020-03-08 NOTE — Addendum Note (Signed)
Addended by: Senaida Ores on: 03/08/2020 10:10 AM   Modules accepted: Orders

## 2020-03-08 NOTE — Patient Instructions (Signed)
Medication Instructions:  Your physician recommends that you continue on your current medications as directed. Please refer to the Current Medication list given to you today.  *If you need a refill on your cardiac medications before your next appointment, please call your pharmacy*   Lab Work: Your physician recommends that you return for lab work today: bmp, pro bnp   If you have labs (blood work) drawn today and your tests are completely normal, you will receive your results only by: MyChart Message (if you have MyChart) OR A paper copy in the mail If you have any lab test that is abnormal or we need to change your treatment, we will call you to review the results.   Testing/Procedures: None   Follow-Up: At [facility], you and your health needs are our priority.  As part of our continuing mission to provide you with exceptional heart care, we have created designated Provider Care Teams.  These Care Teams include your primary Cardiologist (physician) and Advanced Practice Providers (APPs -  Physician Assistants and Nurse Practitioners) who all work together to provide you with the care you need, when you need it.  We recommend signing up for the patient portal called "MyChart".  Sign up information is provided on this After Visit Summary.  MyChart is used to connect with patients for Virtual Visits (Telemedicine).  Patients are able to view lab/test results, encounter notes, upcoming appointments, etc.  Non-urgent messages can be sent to your provider as well.   To learn more about what you can do with MyChart, go to [email or web address].    Your next appointment:   5 month(s)  The format for your next appointment:   In Person  Provider:   Robert Krasowski, MD    Other Instructions   

## 2020-03-08 NOTE — Progress Notes (Signed)
Cardiology Office Note:    Date:  03/08/2020   ID:  Gloria Sharp, DOB 1945-12-28, MRN [id number]  PCP:  Maryella Shivers, MD  Cardiologist:  Jenne Campus, MD    Referring MD: Maryella Shivers, MD   Chief Complaint  Patient presents with  . Follow-up  I am doing fine  History of Present Illness:    Gloria Sharp is a 75 y.o. female with past medical history significant no sustained ventricular tachycardia, she is asymptomatic, echocardiogram showed preserved left ventricle ejection fraction, stress test showed no evidence of ischemia, conservative approach for that.  Also mild aortic stenosis, mild to moderate mitral regurgitation.  Essential hypertension.  Dyslipidemia.  She comes today to my office for follow-up overall she is doing well she says she is getting all but still in spite of that she is able to take care of activities of daily living she works with [employer] and she does a lot of things with no difficulties.  Past Medical History:  Diagnosis Date  . Aortic stenosis 09/04/2019  . CVA (cerebral vascular accident) ([place]) 2015?  Marland Kitchen Cystocele with uterine prolapse 01/17/2018  . Diabetes ([place])   . Dyslipidemia 10/10/2018  . Essential hypertension 10/10/2018  . Fibromyalgia   . GERD (gastroesophageal reflux disease)   . Heart murmur 10/10/2018  . History of CVA (cerebrovascular accident) 10/10/2018  . Hypercholesterolemia   . Hypertension   . Leukocytosis   . Mitral regurgitation 11/24/2018  . Nonsustained ventricular tachycardia ([place]) 11/24/2018  . Obesity   . Post-operative nausea and vomiting    "hard to wake up"  . Rectocele 01/17/2018  . Type 2 diabetes mellitus without complication, without long-term current use of insulin ([place]) 10/10/2018    Past Surgical History:  Procedure Laterality Date  . ABDOMINAL HYSTERECTOMY     partial, left the ovaries  . APPENDECTOMY    . COLONOSCOPY  11/11/2013   Colonioc polyps status post polypectomy. Pancolonic diverticulosis  predominately in the sigmoid colon  . DILATION AND CURETTAGE OF UTERUS    . HERNIA REPAIR     Umbilical  . vaginal polyp removal      Current Medications: Current Meds  Medication Sig  . brimonidine (ALPHAGAN) 0.15 % ophthalmic solution Place 1 drop into both eyes 2 (two) times daily.  . cholestyramine (QUESTRAN) 4 g packet Take 1 packet (4 g total) by mouth daily. Take at least 2 hours before or after rest of the medications (Patient taking differently: Take 4 g by mouth every other day.)  . clobetasol cream (TEMOVATE) 6.31 % Apply 1 application topically as directed.  . clopidogrel (PLAVIX) 75 MG tablet Take 75 mg by mouth daily.  . fenofibrate (TRICOR) 145 MG tablet Take 145 mg by mouth daily.  Marland Kitchen gabapentin (NEURONTIN) 300 MG capsule Take 300 mg by mouth 2 (two) times daily.  Marland Kitchen lisinopril-hydrochlorothiazide (ZESTORETIC) 20-12.5 MG tablet Take 1 tablet by mouth daily.  Marland Kitchen MAGNESIUM PO Take 500 mg by mouth daily.  . metFORMIN (GLUCOPHAGE) 1000 MG tablet Take 1,000 mg by mouth 2 (two) times daily with a meal.  . metoprolol succinate (TOPROL-XL) 100 MG 24 hr tablet TAKE ONE TABLET BY MOUTH EVERY DAY WITH OR IMMEDIATELY FOLLOWING A MEAL  . Omega-3 Fatty Acids (FISH OIL PO) Take 2,000 mg by mouth daily.  . pantoprazole (PROTONIX) 40 MG tablet Take 40 mg by mouth daily as needed (for reflux).   . pioglitazone (ACTOS) 15 MG tablet Take 15 mg by mouth daily.  Marland Kitchen  pravastatin (PRAVACHOL) 20 MG tablet Take 20 mg by mouth every Sunday.   . Semaglutide, 1 MG/DOSE, (OZEMPIC, 1 MG/DOSE,) 2 MG/1.5ML SOPN Inject 1 mg into the skin every Wednesday.   . TURMERIC PO Take by mouth.  . [DISCONTINUED] glimepiride (AMARYL) 2 MG tablet Take 2 mg by mouth daily before breakfast.     Allergies:   Celecoxib, Nabumetone, Ciprofloxacin, and Flagyl [metronidazole]   Social History   Socioeconomic History  . Marital status: Married    Spouse name: Not on file  . Number of children: Not on file  . Years of  education: Not on file  . Highest education level: Not on file  Occupational History  . Not on file  Tobacco Use  . Smoking status: Never Smoker  . Smokeless tobacco: Never Used  Vaping Use  . Vaping Use: Never used  Substance and Sexual Activity  . Alcohol use: Not Currently  . Drug use: Never  . Sexual activity: Not on file  Other Topics Concern  . Not on file  Social History Narrative  . Not on file   Social Determinants of Health   Financial Resource Strain: Not on file  Food Insecurity: Not on file  Transportation Needs: Not on file  Physical Activity: Not on file  Stress: Not on file  Social Connections: Not on file     Family History: The patient's family history includes Cancer in her mother; Diabetes in her mother; Heart disease in her father; Heart failure in her mother; Hypertension in her father and mother; Stroke in her mother. There is no history of Colon cancer, Esophageal cancer, Stomach cancer, Rectal cancer, or Colon polyps. ROS:   Please see the history of present illness.    All 14 point review of systems negative except as described per history of present illness  EKGs/Labs/Other Studies Reviewed:      Recent Labs: No results found for requested labs within last 8760 hours.  Recent Lipid Panel No results found for: CHOL, TRIG, HDL, CHOLHDL, VLDL, LDLCALC, LDLDIRECT  Physical Exam:    VS:  BP (!) 156/78 (BP Location: Left Arm, Patient Position: Sitting)   Pulse 70   Ht 5' (1.524 m)   Wt 222 lb (100.7 kg)   SpO2 97%   BMI 43.36 kg/m     Wt Readings from Last 3 Encounters:  03/08/20 222 lb (100.7 kg)  09/04/19 221 lb 12.8 oz (100.6 kg)  03/25/19 223 lb 4 oz (101.3 kg)     GEN:  Well nourished, well developed in no acute distress HEENT: Normal NECK: No JVD; No carotid bruits LYMPHATICS: No lymphadenopathy CARDIAC: RRR, systolic ejection murmur grade 2 through 3/6 best heard right upper portion of the sternum, there is also holosystolic  murmur grade 2/6 best heard left border of the sternum, no rubs, no gallops RESPIRATORY:  Clear to auscultation without rales, wheezing or rhonchi  ABDOMEN: Soft, non-tender, non-distended MUSCULOSKELETAL:  No edema; No deformity  SKIN: Warm and dry LOWER EXTREMITIES: no swelling NEUROLOGIC:  Alert and oriented x 3 PSYCHIATRIC:  Normal affect   ASSESSMENT:    1. Nonsustained ventricular tachycardia (HCC)   2. Nonrheumatic mitral valve regurgitation   3. Essential hypertension   4. Hypercholesterolemia   5. History of CVA (cerebrovascular accident)   6. Nonrheumatic aortic valve stenosis    PLAN:    In order of problems listed above:  1. Aortic stenosis only mild based on last echocardiogram with mean viewing of 18.  We  will continue monitoring. 2. Nonrheumatic mitral valve regurgitation only mild to moderate.  Continue present management. 3. Pulmonary hypertension, her pulmonary pressure was 42 mmHg based on last echocardiogram.  I suspect etiology of this phenomenon is obesity as well as some degree of mitral regurgitation.  We will check Chem-7 today to see if we can increase some diuresis and improve the situation. 4. Dyslipidemia I did review her K PN which show me her LDL of 63 and HDL of 40.  We will continue present management. 5. History of CVA.  No new problems.   Medication Adjustments/Labs and Tests Ordered: Current medicines are reviewed at length with the patient today.  Concerns regarding medicines are outlined above.  No orders of the defined types were placed in this encounter.  Medication changes: No orders of the defined types were placed in this encounter.   Signed, Park Liter, MD, [facility] 03/08/2020 10:06 AM    [place]

## 2020-03-09 LAB — BASIC METABOLIC PANEL
BUN/Creatinine Ratio: 24 (ref 12–28)
BUN: 23 mg/dL (ref 8–27)
CO2: 21 mmol/L (ref 20–29)
Calcium: 9.6 mg/dL (ref 8.7–10.3)
Chloride: 103 mmol/L (ref 96–106)
Creatinine, Ser: 0.97 mg/dL (ref 0.57–1.00)
GFR calc Af Amer: 67 mL/min/{1.73_m2} (ref >59)
GFR calc non Af Amer: 59 mL/min/{1.73_m2} — ABNORMAL LOW (ref >59)
Glucose: 120 mg/dL — ABNORMAL HIGH (ref 65–99)
Potassium: 4.7 mmol/L (ref 3.5–5.2)
Sodium: 140 mmol/L (ref 134–144)

## 2020-03-09 LAB — PRO B NATRIURETIC PEPTIDE: NT-Pro BNP: 566 pg/mL — ABNORMAL HIGH (ref 0–301)

## 2020-03-11 ENCOUNTER — Telehealth: Payer: Self-pay | Admitting: Emergency Medicine

## 2020-03-11 DIAGNOSIS — Z79899 Other long term (current) drug therapy: Secondary | ICD-10-CM

## 2020-03-11 MED ORDER — FUROSEMIDE 20 MG PO TABS: 20.0000 mg | ORAL_TABLET | Freq: Every day | ORAL | 1 refills | Status: DC

## 2020-03-11 NOTE — Telephone Encounter (Signed)
-----   Message from Park Liter, MD sent at 03/11/2020 10:38 AM EST ----- Please add furosemide 20 mg daily to her medical regiment, she to have a Chem-7 done within the next week

## 2020-03-11 NOTE — Telephone Encounter (Signed)
Called patient informed her of results. She will start lasix 20 mg daily and have labs repeated in 1 week.

## 2020-03-15 DIAGNOSIS — E1122 Type 2 diabetes mellitus with diabetic chronic kidney disease: Secondary | ICD-10-CM | POA: Diagnosis not present

## 2020-03-15 DIAGNOSIS — I152 Hypertension secondary to endocrine disorders: Secondary | ICD-10-CM | POA: Diagnosis not present

## 2020-03-15 DIAGNOSIS — E1159 Type 2 diabetes mellitus with other circulatory complications: Secondary | ICD-10-CM | POA: Diagnosis not present

## 2020-03-15 DIAGNOSIS — N181 Chronic kidney disease, stage 1: Secondary | ICD-10-CM | POA: Diagnosis not present

## 2020-03-18 DIAGNOSIS — Z79899 Other long term (current) drug therapy: Secondary | ICD-10-CM | POA: Diagnosis not present

## 2020-03-18 LAB — BASIC METABOLIC PANEL WITH GFR
BUN/Creatinine Ratio: 22 (ref 12–28)
BUN: 29 mg/dL — ABNORMAL HIGH (ref 8–27)
CO2: 24 mmol/L (ref 20–29)
Calcium: 10 mg/dL (ref 8.7–10.3)
Chloride: 99 mmol/L (ref 96–106)
Creatinine, Ser: 1.34 mg/dL — ABNORMAL HIGH (ref 0.57–1.00)
Glucose: 143 mg/dL — ABNORMAL HIGH (ref 65–99)
Potassium: 5 mmol/L (ref 3.5–5.2)
Sodium: 138 mmol/L (ref 134–144)
eGFR: 42 mL/min/1.73 — ABNORMAL LOW

## 2020-03-23 ENCOUNTER — Telehealth: Payer: Self-pay | Admitting: Emergency Medicine

## 2020-03-23 DIAGNOSIS — R7989 Other specified abnormal findings of blood chemistry: Secondary | ICD-10-CM

## 2020-03-23 NOTE — Telephone Encounter (Signed)
-----   Message from Park Liter, MD sent at 03/23/2020 12:46 PM EST ----- Creatinine increased from .97-1.3 for now.  Lets continue present management but we need to recheck her Chem-7 within the next 10 days

## 2020-03-23 NOTE — Telephone Encounter (Signed)
Called patient informed her of results she will repeat labs in 10 days no further questions.

## 2020-03-24 DIAGNOSIS — E1169 Type 2 diabetes mellitus with other specified complication: Secondary | ICD-10-CM | POA: Diagnosis not present

## 2020-03-31 DIAGNOSIS — M5441 Lumbago with sciatica, right side: Secondary | ICD-10-CM | POA: Diagnosis not present

## 2020-03-31 DIAGNOSIS — Z6841 Body Mass Index (BMI) 40.0 and over, adult: Secondary | ICD-10-CM | POA: Diagnosis not present

## 2020-04-01 DIAGNOSIS — E1159 Type 2 diabetes mellitus with other circulatory complications: Secondary | ICD-10-CM | POA: Diagnosis not present

## 2020-04-01 DIAGNOSIS — I152 Hypertension secondary to endocrine disorders: Secondary | ICD-10-CM | POA: Diagnosis not present

## 2020-04-01 DIAGNOSIS — E1129 Type 2 diabetes mellitus with other diabetic kidney complication: Secondary | ICD-10-CM | POA: Diagnosis not present

## 2020-04-01 DIAGNOSIS — E1169 Type 2 diabetes mellitus with other specified complication: Secondary | ICD-10-CM | POA: Diagnosis not present

## 2020-04-04 DIAGNOSIS — R7989 Other specified abnormal findings of blood chemistry: Secondary | ICD-10-CM | POA: Diagnosis not present

## 2020-04-04 LAB — BASIC METABOLIC PANEL
BUN/Creatinine Ratio: 25 (ref 12–28)
BUN: 30 mg/dL — ABNORMAL HIGH (ref 8–27)
CO2: 24 mmol/L (ref 20–29)
Calcium: 9.9 mg/dL (ref 8.7–10.3)
Chloride: 102 mmol/L (ref 96–106)
Creatinine, Ser: 1.19 mg/dL — ABNORMAL HIGH (ref 0.57–1.00)
Glucose: 184 mg/dL — ABNORMAL HIGH (ref 65–99)
Potassium: 4.4 mmol/L (ref 3.5–5.2)
Sodium: 141 mmol/L (ref 134–144)
eGFR: 49 mL/min/{1.73_m2} — ABNORMAL LOW (ref >59)

## 2020-04-06 ENCOUNTER — Telehealth: Payer: Self-pay | Admitting: Cardiology

## 2020-04-06 DIAGNOSIS — M6281 Muscle weakness (generalized): Secondary | ICD-10-CM | POA: Diagnosis not present

## 2020-04-06 DIAGNOSIS — M5416 Radiculopathy, lumbar region: Secondary | ICD-10-CM | POA: Diagnosis not present

## 2020-04-06 DIAGNOSIS — M545 Low back pain, unspecified: Secondary | ICD-10-CM | POA: Diagnosis not present

## 2020-04-06 NOTE — Telephone Encounter (Signed)
Patient informed of results.  

## 2020-04-06 NOTE — Telephone Encounter (Signed)
Patient is returning call to discuss lab results. 

## 2020-04-08 DIAGNOSIS — M6281 Muscle weakness (generalized): Secondary | ICD-10-CM | POA: Diagnosis not present

## 2020-04-08 DIAGNOSIS — M5416 Radiculopathy, lumbar region: Secondary | ICD-10-CM | POA: Diagnosis not present

## 2020-04-08 DIAGNOSIS — M545 Low back pain, unspecified: Secondary | ICD-10-CM | POA: Diagnosis not present

## 2020-04-11 DIAGNOSIS — M8589 Other specified disorders of bone density and structure, multiple sites: Secondary | ICD-10-CM | POA: Diagnosis not present

## 2020-04-11 DIAGNOSIS — E2839 Other primary ovarian failure: Secondary | ICD-10-CM | POA: Diagnosis not present

## 2020-04-12 DIAGNOSIS — M5416 Radiculopathy, lumbar region: Secondary | ICD-10-CM | POA: Diagnosis not present

## 2020-04-12 DIAGNOSIS — M545 Low back pain, unspecified: Secondary | ICD-10-CM | POA: Diagnosis not present

## 2020-04-12 DIAGNOSIS — M6281 Muscle weakness (generalized): Secondary | ICD-10-CM | POA: Diagnosis not present

## 2020-04-15 DIAGNOSIS — M545 Low back pain, unspecified: Secondary | ICD-10-CM | POA: Diagnosis not present

## 2020-04-15 DIAGNOSIS — M6281 Muscle weakness (generalized): Secondary | ICD-10-CM | POA: Diagnosis not present

## 2020-04-15 DIAGNOSIS — I10 Essential (primary) hypertension: Secondary | ICD-10-CM | POA: Diagnosis not present

## 2020-04-15 DIAGNOSIS — M5416 Radiculopathy, lumbar region: Secondary | ICD-10-CM | POA: Diagnosis not present

## 2020-04-15 DIAGNOSIS — E785 Hyperlipidemia, unspecified: Secondary | ICD-10-CM | POA: Diagnosis not present

## 2020-04-15 DIAGNOSIS — E119 Type 2 diabetes mellitus without complications: Secondary | ICD-10-CM | POA: Diagnosis not present

## 2020-04-19 DIAGNOSIS — M545 Low back pain, unspecified: Secondary | ICD-10-CM | POA: Diagnosis not present

## 2020-04-19 DIAGNOSIS — M5416 Radiculopathy, lumbar region: Secondary | ICD-10-CM | POA: Diagnosis not present

## 2020-04-19 DIAGNOSIS — M6281 Muscle weakness (generalized): Secondary | ICD-10-CM | POA: Diagnosis not present

## 2020-04-22 DIAGNOSIS — M5416 Radiculopathy, lumbar region: Secondary | ICD-10-CM | POA: Diagnosis not present

## 2020-04-22 DIAGNOSIS — M6281 Muscle weakness (generalized): Secondary | ICD-10-CM | POA: Diagnosis not present

## 2020-04-22 DIAGNOSIS — M545 Low back pain, unspecified: Secondary | ICD-10-CM | POA: Diagnosis not present

## 2020-04-25 DIAGNOSIS — M545 Low back pain, unspecified: Secondary | ICD-10-CM | POA: Diagnosis not present

## 2020-04-25 DIAGNOSIS — M5416 Radiculopathy, lumbar region: Secondary | ICD-10-CM | POA: Diagnosis not present

## 2020-04-25 DIAGNOSIS — M6281 Muscle weakness (generalized): Secondary | ICD-10-CM | POA: Diagnosis not present

## 2020-04-29 DIAGNOSIS — M6281 Muscle weakness (generalized): Secondary | ICD-10-CM | POA: Diagnosis not present

## 2020-04-29 DIAGNOSIS — M545 Low back pain, unspecified: Secondary | ICD-10-CM | POA: Diagnosis not present

## 2020-04-29 DIAGNOSIS — M5416 Radiculopathy, lumbar region: Secondary | ICD-10-CM | POA: Diagnosis not present

## 2020-05-02 DIAGNOSIS — M5416 Radiculopathy, lumbar region: Secondary | ICD-10-CM | POA: Diagnosis not present

## 2020-05-02 DIAGNOSIS — M6281 Muscle weakness (generalized): Secondary | ICD-10-CM | POA: Diagnosis not present

## 2020-05-02 DIAGNOSIS — M545 Low back pain, unspecified: Secondary | ICD-10-CM | POA: Diagnosis not present

## 2020-05-04 DIAGNOSIS — M5410 Radiculopathy, site unspecified: Secondary | ICD-10-CM | POA: Diagnosis not present

## 2020-05-04 DIAGNOSIS — Z6841 Body Mass Index (BMI) 40.0 and over, adult: Secondary | ICD-10-CM | POA: Diagnosis not present

## 2020-05-04 DIAGNOSIS — M545 Low back pain, unspecified: Secondary | ICD-10-CM | POA: Diagnosis not present

## 2020-05-05 DIAGNOSIS — M545 Low back pain, unspecified: Secondary | ICD-10-CM | POA: Diagnosis not present

## 2020-05-05 DIAGNOSIS — M5416 Radiculopathy, lumbar region: Secondary | ICD-10-CM | POA: Diagnosis not present

## 2020-05-05 DIAGNOSIS — M6281 Muscle weakness (generalized): Secondary | ICD-10-CM | POA: Diagnosis not present

## 2020-05-12 DIAGNOSIS — M545 Low back pain, unspecified: Secondary | ICD-10-CM | POA: Diagnosis not present

## 2020-05-12 DIAGNOSIS — M5416 Radiculopathy, lumbar region: Secondary | ICD-10-CM | POA: Diagnosis not present

## 2020-05-12 DIAGNOSIS — M6281 Muscle weakness (generalized): Secondary | ICD-10-CM | POA: Diagnosis not present

## 2020-05-15 DIAGNOSIS — I10 Essential (primary) hypertension: Secondary | ICD-10-CM | POA: Diagnosis not present

## 2020-05-15 DIAGNOSIS — E119 Type 2 diabetes mellitus without complications: Secondary | ICD-10-CM | POA: Diagnosis not present

## 2020-05-15 DIAGNOSIS — E785 Hyperlipidemia, unspecified: Secondary | ICD-10-CM | POA: Diagnosis not present

## 2020-05-16 DIAGNOSIS — M5416 Radiculopathy, lumbar region: Secondary | ICD-10-CM | POA: Diagnosis not present

## 2020-05-16 DIAGNOSIS — M6281 Muscle weakness (generalized): Secondary | ICD-10-CM | POA: Diagnosis not present

## 2020-05-16 DIAGNOSIS — M545 Low back pain, unspecified: Secondary | ICD-10-CM | POA: Diagnosis not present

## 2020-05-23 DIAGNOSIS — M5416 Radiculopathy, lumbar region: Secondary | ICD-10-CM | POA: Diagnosis not present

## 2020-05-23 DIAGNOSIS — M6281 Muscle weakness (generalized): Secondary | ICD-10-CM | POA: Diagnosis not present

## 2020-05-23 DIAGNOSIS — M545 Low back pain, unspecified: Secondary | ICD-10-CM | POA: Diagnosis not present

## 2020-05-26 DIAGNOSIS — M6281 Muscle weakness (generalized): Secondary | ICD-10-CM | POA: Diagnosis not present

## 2020-05-26 DIAGNOSIS — M5416 Radiculopathy, lumbar region: Secondary | ICD-10-CM | POA: Diagnosis not present

## 2020-05-26 DIAGNOSIS — M545 Low back pain, unspecified: Secondary | ICD-10-CM | POA: Diagnosis not present

## 2020-05-30 DIAGNOSIS — M545 Low back pain, unspecified: Secondary | ICD-10-CM | POA: Diagnosis not present

## 2020-05-30 DIAGNOSIS — M6281 Muscle weakness (generalized): Secondary | ICD-10-CM | POA: Diagnosis not present

## 2020-05-30 DIAGNOSIS — M5416 Radiculopathy, lumbar region: Secondary | ICD-10-CM | POA: Diagnosis not present

## 2020-06-03 DIAGNOSIS — R195 Other fecal abnormalities: Secondary | ICD-10-CM | POA: Diagnosis not present

## 2020-06-03 DIAGNOSIS — R42 Dizziness and giddiness: Secondary | ICD-10-CM | POA: Diagnosis not present

## 2020-06-03 DIAGNOSIS — K31819 Angiodysplasia of stomach and duodenum without bleeding: Secondary | ICD-10-CM | POA: Diagnosis not present

## 2020-06-03 DIAGNOSIS — E785 Hyperlipidemia, unspecified: Secondary | ICD-10-CM | POA: Diagnosis not present

## 2020-06-03 DIAGNOSIS — Z7984 Long term (current) use of oral hypoglycemic drugs: Secondary | ICD-10-CM | POA: Diagnosis not present

## 2020-06-03 DIAGNOSIS — D519 Vitamin B12 deficiency anemia, unspecified: Secondary | ICD-10-CM | POA: Diagnosis not present

## 2020-06-03 DIAGNOSIS — K2900 Acute gastritis without bleeding: Secondary | ICD-10-CM | POA: Diagnosis not present

## 2020-06-03 DIAGNOSIS — Z6841 Body Mass Index (BMI) 40.0 and over, adult: Secondary | ICD-10-CM | POA: Diagnosis not present

## 2020-06-03 DIAGNOSIS — K5730 Diverticulosis of large intestine without perforation or abscess without bleeding: Secondary | ICD-10-CM | POA: Diagnosis not present

## 2020-06-03 DIAGNOSIS — K922 Gastrointestinal hemorrhage, unspecified: Secondary | ICD-10-CM | POA: Diagnosis not present

## 2020-06-03 DIAGNOSIS — D509 Iron deficiency anemia, unspecified: Secondary | ICD-10-CM | POA: Diagnosis not present

## 2020-06-03 DIAGNOSIS — R0789 Other chest pain: Secondary | ICD-10-CM | POA: Diagnosis not present

## 2020-06-03 DIAGNOSIS — E119 Type 2 diabetes mellitus without complications: Secondary | ICD-10-CM | POA: Diagnosis not present

## 2020-06-03 DIAGNOSIS — K219 Gastro-esophageal reflux disease without esophagitis: Secondary | ICD-10-CM | POA: Diagnosis not present

## 2020-06-03 DIAGNOSIS — D62 Acute posthemorrhagic anemia: Secondary | ICD-10-CM | POA: Diagnosis not present

## 2020-06-03 DIAGNOSIS — M797 Fibromyalgia: Secondary | ICD-10-CM | POA: Diagnosis not present

## 2020-06-03 DIAGNOSIS — K317 Polyp of stomach and duodenum: Secondary | ICD-10-CM | POA: Diagnosis not present

## 2020-06-03 DIAGNOSIS — D649 Anemia, unspecified: Secondary | ICD-10-CM | POA: Diagnosis not present

## 2020-06-03 DIAGNOSIS — I10 Essential (primary) hypertension: Secondary | ICD-10-CM | POA: Diagnosis not present

## 2020-06-03 DIAGNOSIS — K31811 Angiodysplasia of stomach and duodenum with bleeding: Secondary | ICD-10-CM | POA: Diagnosis not present

## 2020-06-04 DIAGNOSIS — D519 Vitamin B12 deficiency anemia, unspecified: Secondary | ICD-10-CM | POA: Diagnosis not present

## 2020-06-04 DIAGNOSIS — K2900 Acute gastritis without bleeding: Secondary | ICD-10-CM | POA: Diagnosis not present

## 2020-06-04 DIAGNOSIS — K31811 Angiodysplasia of stomach and duodenum with bleeding: Secondary | ICD-10-CM | POA: Diagnosis not present

## 2020-06-04 DIAGNOSIS — K31819 Angiodysplasia of stomach and duodenum without bleeding: Secondary | ICD-10-CM | POA: Diagnosis not present

## 2020-06-04 DIAGNOSIS — R195 Other fecal abnormalities: Secondary | ICD-10-CM | POA: Diagnosis not present

## 2020-06-04 DIAGNOSIS — K219 Gastro-esophageal reflux disease without esophagitis: Secondary | ICD-10-CM | POA: Diagnosis not present

## 2020-06-04 DIAGNOSIS — D509 Iron deficiency anemia, unspecified: Secondary | ICD-10-CM | POA: Diagnosis not present

## 2020-06-05 DIAGNOSIS — K31811 Angiodysplasia of stomach and duodenum with bleeding: Secondary | ICD-10-CM | POA: Diagnosis not present

## 2020-06-05 DIAGNOSIS — D509 Iron deficiency anemia, unspecified: Secondary | ICD-10-CM | POA: Diagnosis not present

## 2020-06-05 DIAGNOSIS — K219 Gastro-esophageal reflux disease without esophagitis: Secondary | ICD-10-CM | POA: Diagnosis not present

## 2020-06-06 DIAGNOSIS — K31819 Angiodysplasia of stomach and duodenum without bleeding: Secondary | ICD-10-CM | POA: Diagnosis not present

## 2020-06-06 DIAGNOSIS — I152 Hypertension secondary to endocrine disorders: Secondary | ICD-10-CM | POA: Diagnosis not present

## 2020-06-06 DIAGNOSIS — E1159 Type 2 diabetes mellitus with other circulatory complications: Secondary | ICD-10-CM | POA: Diagnosis not present

## 2020-06-06 DIAGNOSIS — E041 Nontoxic single thyroid nodule: Secondary | ICD-10-CM | POA: Diagnosis not present

## 2020-06-07 DIAGNOSIS — E041 Nontoxic single thyroid nodule: Secondary | ICD-10-CM | POA: Diagnosis not present

## 2020-06-14 DIAGNOSIS — E041 Nontoxic single thyroid nodule: Secondary | ICD-10-CM | POA: Diagnosis not present

## 2020-06-15 DIAGNOSIS — E119 Type 2 diabetes mellitus without complications: Secondary | ICD-10-CM | POA: Diagnosis not present

## 2020-06-15 DIAGNOSIS — D500 Iron deficiency anemia secondary to blood loss (chronic): Secondary | ICD-10-CM | POA: Diagnosis not present

## 2020-06-15 DIAGNOSIS — E785 Hyperlipidemia, unspecified: Secondary | ICD-10-CM | POA: Diagnosis not present

## 2020-06-15 DIAGNOSIS — I10 Essential (primary) hypertension: Secondary | ICD-10-CM | POA: Diagnosis not present

## 2020-06-22 DIAGNOSIS — M6281 Muscle weakness (generalized): Secondary | ICD-10-CM | POA: Diagnosis not present

## 2020-06-22 DIAGNOSIS — M5416 Radiculopathy, lumbar region: Secondary | ICD-10-CM | POA: Diagnosis not present

## 2020-06-22 DIAGNOSIS — M545 Low back pain, unspecified: Secondary | ICD-10-CM | POA: Diagnosis not present

## 2020-06-29 DIAGNOSIS — I152 Hypertension secondary to endocrine disorders: Secondary | ICD-10-CM | POA: Diagnosis not present

## 2020-06-29 DIAGNOSIS — K31819 Angiodysplasia of stomach and duodenum without bleeding: Secondary | ICD-10-CM | POA: Diagnosis not present

## 2020-06-29 DIAGNOSIS — E1159 Type 2 diabetes mellitus with other circulatory complications: Secondary | ICD-10-CM | POA: Diagnosis not present

## 2020-06-29 DIAGNOSIS — M8580 Other specified disorders of bone density and structure, unspecified site: Secondary | ICD-10-CM | POA: Diagnosis not present

## 2020-06-30 DIAGNOSIS — M545 Low back pain, unspecified: Secondary | ICD-10-CM | POA: Diagnosis not present

## 2020-06-30 DIAGNOSIS — M6281 Muscle weakness (generalized): Secondary | ICD-10-CM | POA: Diagnosis not present

## 2020-06-30 DIAGNOSIS — M5416 Radiculopathy, lumbar region: Secondary | ICD-10-CM | POA: Diagnosis not present

## 2020-07-11 DIAGNOSIS — K31819 Angiodysplasia of stomach and duodenum without bleeding: Secondary | ICD-10-CM | POA: Diagnosis not present

## 2020-07-11 DIAGNOSIS — D519 Vitamin B12 deficiency anemia, unspecified: Secondary | ICD-10-CM | POA: Diagnosis not present

## 2020-07-12 ENCOUNTER — Ambulatory Visit
Admission: RE | Admit: 2020-07-12 | Discharge: 2020-07-12 | Disposition: A | Discharge status: Home / Self Care | Payer: Medicare HMO | Source: Ambulatory Visit | Attending: Physical Medicine & Rehabilitation | Admitting: Physical Medicine & Rehabilitation

## 2020-07-12 ENCOUNTER — Encounter: Payer: Self-pay | Admitting: Physical Medicine & Rehabilitation

## 2020-07-12 ENCOUNTER — Other Ambulatory Visit: Payer: Self-pay

## 2020-07-12 ENCOUNTER — Encounter: Payer: Medicare HMO | Attending: Physical Medicine & Rehabilitation | Admitting: Physical Medicine & Rehabilitation

## 2020-07-12 VITALS — BP 148/72 | HR 63 | Temp 97.7°F | Ht 61.0 in | Wt 214.8 lb

## 2020-07-12 DIAGNOSIS — M1712 Unilateral primary osteoarthritis, left knee: Secondary | ICD-10-CM | POA: Diagnosis not present

## 2020-07-12 DIAGNOSIS — M25562 Pain in left knee: Secondary | ICD-10-CM | POA: Diagnosis not present

## 2020-07-12 NOTE — Progress Notes (Signed)
Subjective:    Patient ID: Gloria Sharp, female    DOB: April 15, 1945, 75 y.o.   MRN: [id number]  HPI CC: Left knee pain 75 yo female with 1 year hx of RIght sided pain starting in the back , going down to Leg and into big toe.  THis has worsened since March.  The symptoms have improved over the last month or so.  She in fact now states that her left knee pain is the major problem.  Occ Right leg gives out on her .  Difficulty with prolonged standing. Hx of bilateral foot numbness due to diabetes.  Has been on Gabapentin for ~2years, was on Lyrica prior to that which worked well but was expensive and had some side effects. Has been in physical therapy for her low back however her left knee started bothering her some more. Takes tylenol for pain, has been on plavix since CVA 5 years ago. Pain is worse with bending standing improves with rest therapy exercise medication relief from meds is fair Walking tolerance is 10 minutes she sometimes uses a cane she can do a few steps but really not a full flight.  She is driving. She is retired she needs some help with household duties and shopping but is able to dress and bathe herself Review of systems for weakness, numbness tingling in the feet, trouble walking, dizziness as well as diarrhea and limb swelling. Her current pain management includes applying heat, Biofreeze, TENS unit, 4 Tylenol per day Activity level spends 5 to 6 hours a day in bed, she sits around 5 hours a day stands about 1 hour a day walks about 1/2-hour a day. She exercises 5 days a week moderate intensity 30 minutes at a time strength and walking Pain is described as sharp and intermittent.  Left knee pain has starting in hospital during admission for GI bleed. Now scheduled for Endoscopy.Off plavix Left knee bothering her in physical therapy  Pain Inventory Average Pain 4 Pain Right Now 4 My pain is intermittent and sharp  In the last 24 hours, has pain interfered with the  following? General activity 4 Relation with others 4 Enjoyment of life 4 What TIME of day is your pain at its worst? evening Sleep (in general) Fair  Pain is worse with: bending, standing, and some activites Pain improves with: rest, therapy/exercise, and medication Relief from Meds: 4  use a cane how many minutes can you walk? 10 ability to climb steps?  no do you drive?  yes  retired I need assistance with the following:  household duties and shopping  weakness numbness tingling trouble walking dizziness  New pt  New pt    Family History  Problem Relation Age of Onset   Cancer Mother    Hypertension Mother    Stroke Mother    Diabetes Mother    Heart failure Mother    Hypertension Father    Heart disease Father    Colon cancer Neg Hx    Esophageal cancer Neg Hx    Stomach cancer Neg Hx    Rectal cancer Neg Hx    Colon polyps Neg Hx    Social History   Socioeconomic History   Marital status: Married    Spouse name: Not on file   Number of children: Not on file   Years of education: Not on file   Highest education level: Not on file  Occupational History   Not on file  Tobacco Use  Smoking status: Never   Smokeless tobacco: Never  Vaping Use   Vaping Use: Never used  Substance and Sexual Activity   Alcohol use: Not Currently   Drug use: Never   Sexual activity: Not on file  Other Topics Concern   Not on file  Social History Narrative   Not on file   Social Determinants of Health   Financial Resource Strain: Not on file  Food Insecurity: Not on file  Transportation Needs: Not on file  Physical Activity: Not on file  Stress: Not on file  Social Connections: Not on file   Past Surgical History:  Procedure Laterality Date   ABDOMINAL HYSTERECTOMY     partial, left the ovaries   APPENDECTOMY     COLONOSCOPY  11/11/2013   Colonioc polyps status post polypectomy. Pancolonic diverticulosis predominately in the sigmoid colon   DILATION AND  CURETTAGE OF UTERUS     GALLBLADDER SURGERY Right 07/2014   HERNIA REPAIR     Umbilical   vaginal polyp removal     Past Medical History:  Diagnosis Date   Aortic stenosis 09/04/2019   CVA (cerebral vascular accident) ([place]) 2015?   Cystocele with uterine prolapse 01/17/2018   Diabetes ([place])    Dyslipidemia 10/10/2018   Essential hypertension 10/10/2018   Fibromyalgia    GERD (gastroesophageal reflux disease)    Heart murmur 10/10/2018   History of CVA (cerebrovascular accident) 10/10/2018   Hypercholesterolemia    Hypertension    Leukocytosis    Mitral regurgitation 11/24/2018   Nonsustained ventricular tachycardia ([place]) 11/24/2018   Obesity    Post-operative nausea and vomiting    "hard to wake up"   Rectocele 01/17/2018   Type 2 diabetes mellitus without complication, without long-term current use of insulin ([place]) 10/10/2018   BP (!) 148/72 (BP Location: Left Arm, Patient Position: Sitting, Cuff Size: Normal)   Pulse 63   Temp 97.7 F (36.5 C) (Oral)   Ht 5\' 1"  (1.549 m)   Wt 214 lb 12.8 oz (97.4 kg)   SpO2 98%   PF (!) 4 L/min   BMI 40.59 kg/m   Opioid Risk Score:   Fall Risk Score:  `1  Depression screen PHQ 2/9  No flowsheet data found.   Review of Systems  Gastrointestinal:  Positive for diarrhea.  Musculoskeletal:        Knee pain Leg pain Feet pain Buttocks pain Right shoulder pain  Neurological:  Positive for dizziness, weakness and numbness.       Tingling  All other systems reviewed and are negative.     Objective:   Physical Exam Vitals and nursing note reviewed.  Constitutional:      Appearance: She is obese.  HENT:     Head: Normocephalic and atraumatic.  Eyes:     Extraocular Movements: Extraocular movements intact.     Conjunctiva/sclera: Conjunctivae normal.     Pupils: Pupils are equal, round, and reactive to light.  Cardiovascular:     Rate and Rhythm: Normal rate and regular rhythm.     Heart sounds: Normal heart sounds.  Pulmonary:      Effort: Pulmonary effort is normal. No respiratory distress.     Breath sounds: Normal breath sounds. No wheezing.  Abdominal:     General: Abdomen is flat. Bowel sounds are normal. There is no distension.     Palpations: Abdomen is soft.  Musculoskeletal:     Cervical back: Normal range of motion. No rigidity or tenderness.  Right knee: No deformity, effusion or erythema. Normal range of motion. No tenderness. No patellar tendon tenderness. Normal alignment and normal patellar mobility.     Left knee: No deformity, effusion, erythema or ecchymosis. Normal range of motion. Tenderness present over the medial joint line. No patellar tendon tenderness. Normal alignment and normal patellar mobility.  Skin:    General: Skin is warm and dry.  Neurological:     Mental Status: She is alert and oriented to person, place, and time.     Cranial Nerves: No dysarthria.     Sensory: Sensory deficit present.     Motor: No tremor or abnormal muscle tone.     Coordination: Coordination is intact.     Comments: Decreased pp and LT sensation below mid calf bilaterally + Motor 5/5 in bilateral SDelt, Bi, tri Grip 4/5 Bilateral HF, KE and ADF  Antalgic gait but no evidence of foot drop          Assessment & Plan:   Left knee pain medial joint line worsened by standing , no recent trauma,OA Xrays performed today show severe medial compartment joint space narrowing "bone on Bone"  Lateral compartment intact. Will ask pt to trial voltaren gel 4 x per day  RTW 3wks, if no better consider corticosteroid injection

## 2020-07-12 NOTE — Patient Instructions (Signed)
Try Voltaren gel Left knee apply 4 times a day

## 2020-07-15 DIAGNOSIS — E119 Type 2 diabetes mellitus without complications: Secondary | ICD-10-CM | POA: Diagnosis not present

## 2020-07-15 DIAGNOSIS — E785 Hyperlipidemia, unspecified: Secondary | ICD-10-CM | POA: Diagnosis not present

## 2020-07-15 DIAGNOSIS — I10 Essential (primary) hypertension: Secondary | ICD-10-CM | POA: Diagnosis not present

## 2020-08-01 DIAGNOSIS — E1169 Type 2 diabetes mellitus with other specified complication: Secondary | ICD-10-CM | POA: Diagnosis not present

## 2020-08-08 DIAGNOSIS — E1169 Type 2 diabetes mellitus with other specified complication: Secondary | ICD-10-CM | POA: Diagnosis not present

## 2020-08-08 DIAGNOSIS — E782 Mixed hyperlipidemia: Secondary | ICD-10-CM | POA: Diagnosis not present

## 2020-08-08 DIAGNOSIS — Z139 Encounter for screening, unspecified: Secondary | ICD-10-CM | POA: Diagnosis not present

## 2020-08-08 DIAGNOSIS — Z1331 Encounter for screening for depression: Secondary | ICD-10-CM | POA: Diagnosis not present

## 2020-08-08 DIAGNOSIS — E1129 Type 2 diabetes mellitus with other diabetic kidney complication: Secondary | ICD-10-CM | POA: Diagnosis not present

## 2020-08-08 DIAGNOSIS — Z0000 Encounter for general adult medical examination without abnormal findings: Secondary | ICD-10-CM | POA: Diagnosis not present

## 2020-08-08 DIAGNOSIS — Z862 Personal history of diseases of the blood and blood-forming organs and certain disorders involving the immune mechanism: Secondary | ICD-10-CM | POA: Diagnosis not present

## 2020-08-08 DIAGNOSIS — E669 Obesity, unspecified: Secondary | ICD-10-CM | POA: Diagnosis not present

## 2020-08-08 DIAGNOSIS — N1831 Chronic kidney disease, stage 3a: Secondary | ICD-10-CM | POA: Diagnosis not present

## 2020-08-09 DIAGNOSIS — K449 Diaphragmatic hernia without obstruction or gangrene: Secondary | ICD-10-CM | POA: Diagnosis not present

## 2020-08-09 DIAGNOSIS — D500 Iron deficiency anemia secondary to blood loss (chronic): Secondary | ICD-10-CM | POA: Diagnosis not present

## 2020-08-09 DIAGNOSIS — K31811 Angiodysplasia of stomach and duodenum with bleeding: Secondary | ICD-10-CM | POA: Diagnosis not present

## 2020-08-09 DIAGNOSIS — D510 Vitamin B12 deficiency anemia due to intrinsic factor deficiency: Secondary | ICD-10-CM | POA: Diagnosis not present

## 2020-08-10 ENCOUNTER — Other Ambulatory Visit: Payer: Self-pay

## 2020-08-10 ENCOUNTER — Ambulatory Visit: Payer: Medicare HMO | Admitting: Cardiology

## 2020-08-10 ENCOUNTER — Encounter: Payer: Self-pay | Admitting: Cardiology

## 2020-08-10 VITALS — BP 166/88 | HR 82 | Ht 61.0 in | Wt 213.0 lb

## 2020-08-10 DIAGNOSIS — I350 Nonrheumatic aortic (valve) stenosis: Secondary | ICD-10-CM | POA: Diagnosis not present

## 2020-08-10 DIAGNOSIS — I10 Essential (primary) hypertension: Secondary | ICD-10-CM

## 2020-08-10 DIAGNOSIS — I340 Nonrheumatic mitral (valve) insufficiency: Secondary | ICD-10-CM

## 2020-08-10 DIAGNOSIS — I472 Ventricular tachycardia: Secondary | ICD-10-CM | POA: Diagnosis not present

## 2020-08-10 DIAGNOSIS — E785 Hyperlipidemia, unspecified: Secondary | ICD-10-CM

## 2020-08-10 DIAGNOSIS — I4729 Other ventricular tachycardia: Secondary | ICD-10-CM

## 2020-08-10 NOTE — Addendum Note (Signed)
Addended by: Orvan July on: 08/10/2020 09:44 AM   Modules accepted: Orders

## 2020-08-10 NOTE — Progress Notes (Signed)
Cardiology Office Note:    Date:  08/10/2020   ID:  Gloria Sharp, DOB Sep 22, 1945, MRN [id number]  PCP:  Gloria Shivers, MD  Cardiologist:  Gloria Campus, MD    Referring MD: Gloria Shivers, MD   Chief Complaint  Patient presents with   Follow-up    History of Present Illness:    Gloria Sharp is a 75 y.o. female with past medical history significant for nonsustained ventricular tachycardia, work-up after that was unrevealing that work-up included stress test which was negative as well as echocardiogram.  Last echocardiogram done in September last year showing mild aortic stenosis mild to moderate mitral regurgitation.  She comes today to my office for follow-up overall cardiac wise doing well.  Recently she was discovered to be anemic.  She was fine also to have some bleeding areas in her stomach which was actually yesterday taking care of.  She did receive also blood transfusion as well as iron infusion.  Still complain of being weak tired exhausted denies have any cardiac complaints.  There is no chest pain tightness squeezing pressure burning chest there is no shortness of breath no dizziness no passing out no palpitations.  Past Medical History:  Diagnosis Date   Aortic stenosis 09/04/2019   CVA (cerebral vascular accident) [facility]) 2015?   Cystocele with uterine prolapse 01/17/2018   Diabetes ([place])    Dyslipidemia 10/10/2018   Essential hypertension 10/10/2018   Fibromyalgia    GERD (gastroesophageal reflux disease)    Heart murmur 10/10/2018   History of CVA (cerebrovascular accident) 10/10/2018   Hypercholesterolemia    Hypertension    Leukocytosis    Mitral regurgitation 11/24/2018   Nonsustained ventricular tachycardia ([place]) 11/24/2018   Obesity    Post-operative nausea and vomiting    "hard to wake up"   Rectocele 01/17/2018   Type 2 diabetes mellitus without complication, without long-term current use of insulin ([place]) 10/10/2018    Past Surgical History:  Procedure  Laterality Date   ABDOMINAL HYSTERECTOMY     partial, left the ovaries   APPENDECTOMY     COLONOSCOPY  11/11/2013   Colonioc polyps status post polypectomy. Pancolonic diverticulosis predominately in the sigmoid colon   DILATION AND CURETTAGE OF UTERUS     GALLBLADDER SURGERY Right 07/2014   HERNIA REPAIR     Umbilical   vaginal polyp removal      Current Medications: Current Meds  Medication Sig   acetaminophen (TYLENOL) 650 MG CR tablet Take 650 mg by mouth every 8 (eight) hours as needed for pain. 2 tablets BID   alendronate (FOSAMAX) 35 MG tablet Take 35 mg by mouth every 7 (seven) days. Take with a full glass of water on an empty stomach.   brimonidine (ALPHAGAN) 0.15 % ophthalmic solution Place 1 drop into both eyes 2 (two) times daily.   cholestyramine (QUESTRAN) 4 g packet Take 1 packet (4 g total) by mouth daily. Take at least 2 hours before or after rest of the medications   clobetasol cream (TEMOVATE) [id number] % Apply 1 application topically as directed.   fenofibrate (TRICOR) 145 MG tablet Take 145 mg by mouth daily.   furosemide (LASIX) 20 MG tablet Take 1 tablet (20 mg total) by mouth daily.   gabapentin (NEURONTIN) 300 MG capsule Take 300 mg by mouth 2 (two) times daily.   lisinopril-hydrochlorothiazide (ZESTORETIC) 20-12.5 MG tablet Take 1 tablet by mouth daily.   MAGNESIUM PO Take 500 mg by mouth daily.   metFORMIN (GLUCOPHAGE) 1000  MG tablet Take 1,000 mg by mouth 2 (two) times daily with a meal.   metoprolol succinate (TOPROL-XL) 100 MG 24 hr tablet TAKE ONE TABLET BY MOUTH EVERY DAY WITH OR IMMEDIATELY FOLLOWING A MEAL   Omega-3 Fatty Acids (FISH OIL PO) Take 2,000 mg by mouth daily.   pantoprazole (PROTONIX) 40 MG tablet Take 40 mg by mouth daily as needed (for reflux).    permethrin (ELIMITE) 5 % cream Apply 1 application topically as needed for itching. For Lichen Chronicus   pioglitazone (ACTOS) 15 MG tablet Take 15 mg by mouth daily.   pravastatin (PRAVACHOL) 20  MG tablet Take 20 mg by mouth every Sunday.    Semaglutide, 1 MG/DOSE, (OZEMPIC, 1 MG/DOSE,) 2 MG/1.5ML SOPN Inject 1 mg into the skin every Wednesday.    TURMERIC PO Take 1 tablet by mouth daily.   vitamin B-12 (CYANOCOBALAMIN) 1000 MCG tablet Take 1,000 mcg by mouth daily.     Allergies:   Celecoxib, Nabumetone, Ciprofloxacin, and Flagyl [metronidazole]   Social History   Socioeconomic History   Marital status: Married    Spouse name: Not on file   Number of children: Not on file   Years of education: Not on file   Highest education level: Not on file  Occupational History   Not on file  Tobacco Use   Smoking status: Never   Smokeless tobacco: Never  Vaping Use   Vaping Use: Never used  Substance and Sexual Activity   Alcohol use: Not Currently   Drug use: Never   Sexual activity: Not on file  Other Topics Concern   Not on file  Social History Narrative   Not on file   Social Determinants of Health   Financial Resource Strain: Not on file  Food Insecurity: Not on file  Transportation Needs: Not on file  Physical Activity: Not on file  Stress: Not on file  Social Connections: Not on file     Family History: The patient's family history includes Cancer in her mother; Diabetes in her mother; Heart disease in her father; Heart failure in her mother; Hypertension in her father and mother; Stroke in her mother. There is no history of Colon cancer, Esophageal cancer, Stomach cancer, Rectal cancer, or Colon polyps. ROS:   Please see the history of present illness.    All 14 point review of systems negative except as described per history of present illness  EKGs/Labs/Other Studies Reviewed:      Recent Labs: 03/08/2020: NT-Pro BNP 566 04/04/2020: BUN 30; Creatinine, Ser 1.19; Potassium 4.4; Sodium 141  Recent Lipid Panel No results found for: CHOL, TRIG, HDL, CHOLHDL, VLDL, LDLCALC, LDLDIRECT  Physical Exam:    VS:  BP (!) 166/88 (BP Location: Left Arm, Patient  Position: Sitting, Cuff Size: Normal)   Pulse 82   Ht '5\' 1"'$  (1.549 m)   Wt 213 lb (96.6 kg)   SpO2 98%   BMI 40.25 kg/m     Wt Readings from Last 3 Encounters:  08/10/20 213 lb (96.6 kg)  07/12/20 214 lb 12.8 oz (97.4 kg)  03/08/20 222 lb (100.7 kg)     GEN:  Well nourished, well developed in no acute distress HEENT: Normal NECK: No JVD; No carotid bruits LYMPHATICS: No lymphadenopathy CARDIAC: RRR, systolic ejection murmur grade 3/6 best heard right upper portion of the sternum, S2 is still present, no rubs, no gallops RESPIRATORY:  Clear to auscultation without rales, wheezing or rhonchi  ABDOMEN: Soft, non-tender, non-distended MUSCULOSKELETAL:  No  edema; No deformity  SKIN: Warm and dry LOWER EXTREMITIES: no swelling NEUROLOGIC:  Alert and oriented x 3 PSYCHIATRIC:  Normal affect   ASSESSMENT:    1. Nonsustained ventricular tachycardia (HCC)   2. Nonrheumatic aortic valve stenosis   3. Nonrheumatic mitral valve regurgitation   4. Essential hypertension   5. Dyslipidemia    PLAN:    In order of problems listed above:  Nonsustained ventricular tachycardia asymptomatic.  Stress test normal ejection fraction and no evidence of ischemia continue conservative approach.  No symptoms. Nonrheumatic arctic valve stenosis we will schedule her to have echocardiogram done in September she does have some symptoms that can suggest worsening of aortic stenosis however those are coming most likely from significant anemia that she had.  I cannot imagine such a rapid progression of the aortic stenosis. Mitral valve regurgitation echocardiogram will be done Essential hypertension elevated today but she is not feeling well.  Overall she said when everything is fine her blood pressure is good. Dyslipidemia I did review her K PN data from March of this year showing LDL of 61 HDL 42 we will continue present management.   Medication Adjustments/Labs and Tests Ordered: Current medicines are  reviewed at length with the patient today.  Concerns regarding medicines are outlined above.  No orders of the defined types were placed in this encounter.  Medication changes: No orders of the defined types were placed in this encounter.   Signed, Park Liter, MD, [facility] 08/10/2020 9:32 AM    [place]

## 2020-08-10 NOTE — Patient Instructions (Signed)
Medication Instructions:  Your physician recommends that you continue on your current medications as directed. Please refer to the Current Medication list given to you today.  *If you need a refill on your cardiac medications before your next appointment, please call your pharmacy*   Lab Work: None If you have labs (blood work) drawn today and your tests are completely normal, you will receive your results only by: [place] (if you have MyChart) OR A paper copy in the mail If you have any lab test that is abnormal or we need to change your treatment, we will call you to review the results.   Testing/Procedures: Your physician has requested that you have an echocardiogram. Echocardiography is a painless test that uses sound waves to create images of your heart. It provides your doctor with information about the size and shape of your heart and how well your heart's chambers and valves are working. This procedure takes approximately one hour. There are no restrictions for this procedure.    Follow-Up: At [facility], you and your health needs are our priority.  As part of our continuing mission to provide you with exceptional heart care, we have created designated Provider Care Teams.  These Care Teams include your primary Cardiologist (physician) and Advanced Practice Providers (APPs -  Physician Assistants and Nurse Practitioners) who all work together to provide you with the care you need, when you need it.  We recommend signing up for the patient portal called "MyChart".  Sign up information is provided on this After Visit Summary.  MyChart is used to connect with patients for Virtual Visits (Telemedicine).  Patients are able to view lab/test results, encounter notes, upcoming appointments, etc.  Non-urgent messages can be sent to your provider as well.   To learn more about what you can do with MyChart, go to [email or web address].    Your next appointment:   6  month(s)  The format for your next appointment:   In Person  Provider:   Jenne Campus, MD   Other Instructions Echocardiogram An echocardiogram is a test that uses sound waves (ultrasound) to produce images of the heart. Images from an echocardiogram can provide important information about: Heart size and shape. The size and thickness and movement of your heart's walls. Heart muscle function and strength. Heart valve function or if you have stenosis. Stenosis is when the heart valves are too narrow. If blood is flowing backward through the heart valves (regurgitation). A tumor or infectious growth around the heart valves. Areas of heart muscle that are not working well because of poor blood flow or injury from a heart attack. Aneurysm detection. An aneurysm is a weak or damaged part of an artery wall. The wall bulges out from the normal force of blood pumping through the body. Tell a health care provider about: Any allergies you have. All medicines you are taking, including vitamins, herbs, eye drops, creams, and over-the-counter medicines. Any blood disorders you have. Any surgeries you have had. Any medical conditions you have. Whether you are pregnant or may be pregnant. What are the risks? Generally, this is a safe test. However, problems may occur, including an allergic reaction to dye (contrast) that may be used during the test. What happens before the test? No specific preparation is needed. You may eat and drink normally. What happens during the test?  You will take off your clothes from the waist up and put on a hospital gown. Electrodes or electrocardiogram (ECG)patches may be placed on  your chest. The electrodes or patches are then connected to a device that monitors your heart rate and rhythm. You will lie down on a table for an ultrasound exam. A gel will be applied to your chest to help sound waves pass through your skin. A handheld device, called a transducer,  will be pressed against your chest and moved over your heart. The transducer produces sound waves that travel to your heart and bounce back (or "echo" back) to the transducer. These sound waves will be captured in real-time and changed into images of your heart that can be viewed on a video monitor. The images will be recorded on a computer and reviewed by your health care provider. You may be asked to change positions or hold your breath for a short time. This makes it easier to get different views or better views of your heart. In some cases, you may receive contrast through an IV in one of your veins. This can improve the quality of the pictures from your heart. The procedure may vary among health care providers and hospitals. What can I expect after the test? You may return to your normal, everyday life, including diet, activities, andmedicines, unless your health care provider tells you not to do that. Follow these instructions at home: It is up to you to get the results of your test. Ask your health care provider, or the department that is doing the test, when your results will be ready. Keep all follow-up visits. This is important. Summary An echocardiogram is a test that uses sound waves (ultrasound) to produce images of the heart. Images from an echocardiogram can provide important information about the size and shape of your heart, heart muscle function, heart valve function, and other possible heart problems. You do not need to do anything to prepare before this test. You may eat and drink normally. After the echocardiogram is completed, you may return to your normal, everyday life, unless your health care provider tells you not to do that. This information is not intended to replace advice given to you by your health care provider. Make sure you discuss any questions you have with your healthcare provider. Document Revised: 08/25/2019 Document Reviewed: 08/25/2019 Elsevier Patient  Education  2022 [employer].

## 2020-08-11 ENCOUNTER — Encounter: Payer: Self-pay | Admitting: Physical Medicine & Rehabilitation

## 2020-08-11 ENCOUNTER — Other Ambulatory Visit: Payer: Self-pay

## 2020-08-11 ENCOUNTER — Encounter: Payer: Medicare HMO | Attending: Physical Medicine & Rehabilitation | Admitting: Physical Medicine & Rehabilitation

## 2020-08-11 VITALS — BP 160/82 | HR 69 | Temp 98.1°F | Ht 61.0 in | Wt 211.6 lb

## 2020-08-11 DIAGNOSIS — M47816 Spondylosis without myelopathy or radiculopathy, lumbar region: Secondary | ICD-10-CM | POA: Insufficient documentation

## 2020-08-11 DIAGNOSIS — M25562 Pain in left knee: Secondary | ICD-10-CM | POA: Diagnosis not present

## 2020-08-11 NOTE — Patient Instructions (Signed)
Back Exercises These exercises help to make your trunk and back strong. They also help to keep the lower back flexible. Doing these exercises can help to prevent back pain or lessen existing pain. If you have back pain, try to do these exercises 2-3 times each day or as told by your doctor. As you get better, do the exercises once each day. Repeat the exercises more often as told by your doctor. To stop back pain from coming back, do the exercises once each day, or as told by your doctor. Exercises Single knee to chest Do these steps 3-5 times in a row for each leg: Lie on your back on a firm bed or the floor with your legs stretched out. Bring one knee to your chest. Grab your knee or thigh with both hands and hold them it in place. Pull on your knee until you feel a gentle stretch in your lower back or buttocks. Keep doing the stretch for 10-30 seconds. Slowly let go of your leg and straighten it. Pelvic tilt Do these steps 5-10 times in a row: Lie on your back on a firm bed or the floor with your legs stretched out. Bend your knees so they point up to the ceiling. Your feet should be flat on the floor. Tighten your lower belly (abdomen) muscles to press your lower back against the floor. This will make your tailbone point up to the ceiling instead of pointing down to your feet or the floor. Stay in this position for 5-10 seconds while you gently tighten your muscles and breathe evenly. Cat-cow Do these steps until your lower back bends more easily: Get on your hands and knees on a firm surface. Keep your hands under your shoulders, and keep your knees under your hips. You may put padding under your knees. Let your head hang down toward your chest. Tighten (contract) the muscles in your belly. Point your tailbone toward the floor so your lower back becomes rounded like the back of a cat. Stay in this position for 5 seconds. Slowly lift your head. Let the muscles of your belly relax. Point  your tailbone up toward the ceiling so your back forms a sagging arch like the back of a cow. Stay in this position for 5 seconds.  Press-ups Do these steps 5-10 times in a row: Lie on your belly (face-down) on the floor. Place your hands near your head, about shoulder-width apart. While you keep your back relaxed and keep your hips on the floor, slowly straighten your arms to raise the top half of your body and lift your shoulders. Do not use your back muscles. You may change where you place your hands in order to make yourself more comfortable. Stay in this position for 5 seconds. Slowly return to lying flat on the floor.  Bridges Do these steps 10 times in a row: Lie on your back on a firm surface. Bend your knees so they point up to the ceiling. Your feet should be flat on the floor. Your arms should be flat at your sides, next to your body. Tighten your butt muscles and lift your butt off the floor until your waist is almost as high as your knees. If you do not feel the muscles working in your butt and the back of your thighs, slide your feet 1-2 inches farther away from your butt. Stay in this position for 3-5 seconds. Slowly lower your butt to the floor, and let your butt muscles relax. If  this exercise is too easy, try doing it with your arms crossed over yourchest. Belly crunches Do these steps 5-10 times in a row: Lie on your back on a firm bed or the floor with your legs stretched out. Bend your knees so they point up to the ceiling. Your feet should be flat on the floor. Cross your arms over your chest. Tip your chin a little bit toward your chest but do not bend your neck. Tighten your belly muscles and slowly raise your chest just enough to lift your shoulder blades a tiny bit off of the floor. Avoid raising your body higher than that, because it can put too much stress on your low back. Slowly lower your chest and your head to the floor. Back lifts Do these steps 5-10 times  in a row: Lie on your belly (face-down) with your arms at your sides, and rest your forehead on the floor. Tighten the muscles in your legs and your butt. Slowly lift your chest off of the floor while you keep your hips on the floor. Keep the back of your head in line with the curve in your back. Look at the floor while you do this. Stay in this position for 3-5 seconds. Slowly lower your chest and your face to the floor. Contact a doctor if: Your back pain gets a lot worse when you do an exercise. Your back pain does not get better 2 hours after you exercise. If you have any of these problems, stop doing the exercises. Do not do them again unless your doctor says it is okay. Get help right away if: You have sudden, very bad back pain. If this happens, stop doing the exercises. Do not do them again unless your doctor says it is okay. This information is not intended to replace advice given to you by your health care provider. Make sure you discuss any questions you have with your healthcare provider. Document Revised: 09/26/2017 Document Reviewed: 09/26/2017 Elsevier Patient Education  2022 [employer].

## 2020-08-11 NOTE — Progress Notes (Signed)
75 year old female with left greater than right knee pain and osteoarthritis, diabetes and CVA who returns today stating that her left knee pain is doing better with the Voltaren gel.  She is only using it twice a day.  We discussed that she should use it 3-4 times per day. She has not had any skin irritation with this medication. She also has history of low back pain and would like me to check her for this. Her low back pain does not radiate to the lower extremities.  She has had no injury or surgery in her lumbar spine area.  She indicates that she had an MRI many years ago but nothing recently.  She has not been doing any type of back exercises and we discussed doing knee-to-chest exercise, pelvic tilt exercise, pelvic bridges on a daily basis at least 1 set a day each  Her exam demonstrates some tenderness palpation lumbar paraspinal area in the lumbosacral junction.  There is pain with extension greater than with flexion Negative straight leg raising bilaterally Motor strength is 5/5 bilateral hip flexor knee extensor ankle dorsiflexor.  She does have a valgus deformity at the left knee.  Impression 1 Chronic chronic low back pain without radiculopathy or sciatic symptoms.  Suspect either degenerative disc or lumbar facet arthropathy is underlying issues.  She has not had any recent MRIs.  We will try lumbar exercise for the next month and see if she responds to this.  If not we may need to pursue lumbar MRI without contrast X-ray report from [facility] 05/04/2020 dictated lower lumbar facet arthropathy.  2.  Left knee osteoarthritis with valgus deformity symptomatically improved with Voltaren gel have asked patient to increase it to 3 times daily or even 4 times daily. I reviewed her x-ray from 07/12/2020 demonstrating severe tricompartmental osteoarthritis.

## 2020-08-15 DIAGNOSIS — I10 Essential (primary) hypertension: Secondary | ICD-10-CM | POA: Diagnosis not present

## 2020-08-15 DIAGNOSIS — E785 Hyperlipidemia, unspecified: Secondary | ICD-10-CM | POA: Diagnosis not present

## 2020-08-15 DIAGNOSIS — E119 Type 2 diabetes mellitus without complications: Secondary | ICD-10-CM | POA: Diagnosis not present

## 2020-08-17 DIAGNOSIS — Z20822 Contact with and (suspected) exposure to covid-19: Secondary | ICD-10-CM | POA: Diagnosis not present

## 2020-08-17 DIAGNOSIS — U071 COVID-19: Secondary | ICD-10-CM | POA: Diagnosis not present

## 2020-08-17 DIAGNOSIS — R509 Fever, unspecified: Secondary | ICD-10-CM | POA: Diagnosis not present

## 2020-08-31 ENCOUNTER — Other Ambulatory Visit: Payer: Self-pay | Admitting: Cardiology

## 2020-09-15 ENCOUNTER — Other Ambulatory Visit: Payer: Self-pay

## 2020-09-15 ENCOUNTER — Ambulatory Visit (INDEPENDENT_AMBULATORY_CARE_PROVIDER_SITE_OTHER): Payer: Medicare HMO

## 2020-09-15 DIAGNOSIS — I4729 Other ventricular tachycardia: Secondary | ICD-10-CM

## 2020-09-15 DIAGNOSIS — I350 Nonrheumatic aortic (valve) stenosis: Secondary | ICD-10-CM

## 2020-09-15 DIAGNOSIS — I472 Ventricular tachycardia: Secondary | ICD-10-CM | POA: Diagnosis not present

## 2020-09-15 DIAGNOSIS — E785 Hyperlipidemia, unspecified: Secondary | ICD-10-CM | POA: Diagnosis not present

## 2020-09-15 DIAGNOSIS — E119 Type 2 diabetes mellitus without complications: Secondary | ICD-10-CM | POA: Diagnosis not present

## 2020-09-15 DIAGNOSIS — I10 Essential (primary) hypertension: Secondary | ICD-10-CM | POA: Diagnosis not present

## 2020-09-15 LAB — ECHOCARDIOGRAM COMPLETE
AR max vel: 0.97 cm2
AV Area VTI: 1.09 cm2
AV Area mean vel: 0.99 cm2
AV Mean grad: 17 mmHg
AV Peak grad: 28.5 mmHg
Ao pk vel: 2.67 m/s
Area-P 1/2: 2.37 cm2
MV VTI: 1.64 cm2
S' Lateral: 2.8 cm

## 2020-10-03 DIAGNOSIS — K5790 Diverticulosis of intestine, part unspecified, without perforation or abscess without bleeding: Secondary | ICD-10-CM | POA: Diagnosis not present

## 2020-10-03 DIAGNOSIS — E538 Deficiency of other specified B group vitamins: Secondary | ICD-10-CM | POA: Diagnosis not present

## 2020-10-03 DIAGNOSIS — D509 Iron deficiency anemia, unspecified: Secondary | ICD-10-CM | POA: Diagnosis not present

## 2020-10-03 DIAGNOSIS — R195 Other fecal abnormalities: Secondary | ICD-10-CM | POA: Diagnosis not present

## 2020-10-03 DIAGNOSIS — K31819 Angiodysplasia of stomach and duodenum without bleeding: Secondary | ICD-10-CM | POA: Diagnosis not present

## 2020-10-10 DIAGNOSIS — H401131 Primary open-angle glaucoma, bilateral, mild stage: Secondary | ICD-10-CM | POA: Diagnosis not present

## 2020-10-10 DIAGNOSIS — E113293 Type 2 diabetes mellitus with mild nonproliferative diabetic retinopathy without macular edema, bilateral: Secondary | ICD-10-CM | POA: Diagnosis not present

## 2020-10-10 DIAGNOSIS — G43B0 Ophthalmoplegic migraine, not intractable: Secondary | ICD-10-CM | POA: Diagnosis not present

## 2020-10-10 DIAGNOSIS — H26493 Other secondary cataract, bilateral: Secondary | ICD-10-CM | POA: Diagnosis not present

## 2020-10-14 ENCOUNTER — Encounter: Payer: Medicare HMO | Admitting: Physical Medicine & Rehabilitation

## 2020-10-15 DIAGNOSIS — E785 Hyperlipidemia, unspecified: Secondary | ICD-10-CM | POA: Diagnosis not present

## 2020-10-15 DIAGNOSIS — E119 Type 2 diabetes mellitus without complications: Secondary | ICD-10-CM | POA: Diagnosis not present

## 2020-10-15 DIAGNOSIS — I10 Essential (primary) hypertension: Secondary | ICD-10-CM | POA: Diagnosis not present

## 2020-10-21 DIAGNOSIS — E538 Deficiency of other specified B group vitamins: Secondary | ICD-10-CM | POA: Diagnosis not present

## 2020-10-21 DIAGNOSIS — D509 Iron deficiency anemia, unspecified: Secondary | ICD-10-CM | POA: Diagnosis not present

## 2020-10-21 DIAGNOSIS — E119 Type 2 diabetes mellitus without complications: Secondary | ICD-10-CM | POA: Diagnosis not present

## 2020-10-21 DIAGNOSIS — Z791 Long term (current) use of non-steroidal anti-inflammatories (NSAID): Secondary | ICD-10-CM | POA: Diagnosis not present

## 2020-10-21 DIAGNOSIS — I10 Essential (primary) hypertension: Secondary | ICD-10-CM | POA: Diagnosis not present

## 2020-10-21 DIAGNOSIS — D649 Anemia, unspecified: Secondary | ICD-10-CM | POA: Diagnosis not present

## 2020-10-21 DIAGNOSIS — M797 Fibromyalgia: Secondary | ICD-10-CM | POA: Diagnosis not present

## 2020-10-21 DIAGNOSIS — K449 Diaphragmatic hernia without obstruction or gangrene: Secondary | ICD-10-CM | POA: Diagnosis not present

## 2020-10-21 DIAGNOSIS — K31819 Angiodysplasia of stomach and duodenum without bleeding: Secondary | ICD-10-CM | POA: Diagnosis not present

## 2020-10-26 DIAGNOSIS — Z862 Personal history of diseases of the blood and blood-forming organs and certain disorders involving the immune mechanism: Secondary | ICD-10-CM | POA: Diagnosis not present

## 2020-10-26 DIAGNOSIS — Z6841 Body Mass Index (BMI) 40.0 and over, adult: Secondary | ICD-10-CM | POA: Diagnosis not present

## 2020-10-26 DIAGNOSIS — M79675 Pain in left toe(s): Secondary | ICD-10-CM | POA: Diagnosis not present

## 2020-10-26 DIAGNOSIS — S99929A Unspecified injury of unspecified foot, initial encounter: Secondary | ICD-10-CM | POA: Diagnosis not present

## 2020-11-01 DIAGNOSIS — S97112A Crushing injury of left great toe, initial encounter: Secondary | ICD-10-CM | POA: Diagnosis not present

## 2020-11-01 DIAGNOSIS — M79675 Pain in left toe(s): Secondary | ICD-10-CM | POA: Diagnosis not present

## 2020-11-01 DIAGNOSIS — M79676 Pain in unspecified toe(s): Secondary | ICD-10-CM | POA: Diagnosis not present

## 2020-11-01 DIAGNOSIS — Z6841 Body Mass Index (BMI) 40.0 and over, adult: Secondary | ICD-10-CM | POA: Diagnosis not present

## 2020-11-03 DIAGNOSIS — J069 Acute upper respiratory infection, unspecified: Secondary | ICD-10-CM | POA: Diagnosis not present

## 2020-11-03 DIAGNOSIS — R1084 Generalized abdominal pain: Secondary | ICD-10-CM | POA: Diagnosis not present

## 2020-11-03 DIAGNOSIS — N3090 Cystitis, unspecified without hematuria: Secondary | ICD-10-CM | POA: Diagnosis not present

## 2020-11-06 DIAGNOSIS — K4090 Unilateral inguinal hernia, without obstruction or gangrene, not specified as recurrent: Secondary | ICD-10-CM | POA: Diagnosis not present

## 2020-11-06 DIAGNOSIS — N200 Calculus of kidney: Secondary | ICD-10-CM | POA: Diagnosis not present

## 2020-11-06 DIAGNOSIS — N289 Disorder of kidney and ureter, unspecified: Secondary | ICD-10-CM | POA: Diagnosis not present

## 2020-11-06 DIAGNOSIS — S32601A Unspecified fracture of right ischium, initial encounter for closed fracture: Secondary | ICD-10-CM | POA: Diagnosis not present

## 2020-11-06 DIAGNOSIS — R109 Unspecified abdominal pain: Secondary | ICD-10-CM | POA: Diagnosis not present

## 2020-11-06 DIAGNOSIS — K5730 Diverticulosis of large intestine without perforation or abscess without bleeding: Secondary | ICD-10-CM | POA: Diagnosis not present

## 2020-11-07 DIAGNOSIS — R109 Unspecified abdominal pain: Secondary | ICD-10-CM | POA: Diagnosis not present

## 2020-11-07 DIAGNOSIS — R93429 Abnormal radiologic findings on diagnostic imaging of unspecified kidney: Secondary | ICD-10-CM | POA: Diagnosis not present

## 2020-11-07 DIAGNOSIS — Z6841 Body Mass Index (BMI) 40.0 and over, adult: Secondary | ICD-10-CM | POA: Diagnosis not present

## 2020-11-07 DIAGNOSIS — N2889 Other specified disorders of kidney and ureter: Secondary | ICD-10-CM | POA: Diagnosis not present

## 2020-11-07 DIAGNOSIS — Z7689 Persons encountering health services in other specified circumstances: Secondary | ICD-10-CM | POA: Diagnosis not present

## 2020-11-15 DIAGNOSIS — E119 Type 2 diabetes mellitus without complications: Secondary | ICD-10-CM | POA: Diagnosis not present

## 2020-11-15 DIAGNOSIS — I10 Essential (primary) hypertension: Secondary | ICD-10-CM | POA: Diagnosis not present

## 2020-11-15 DIAGNOSIS — E785 Hyperlipidemia, unspecified: Secondary | ICD-10-CM | POA: Diagnosis not present

## 2020-11-21 DIAGNOSIS — N281 Cyst of kidney, acquired: Secondary | ICD-10-CM | POA: Diagnosis not present

## 2020-11-21 DIAGNOSIS — Z9049 Acquired absence of other specified parts of digestive tract: Secondary | ICD-10-CM | POA: Diagnosis not present

## 2020-12-06 DIAGNOSIS — E1169 Type 2 diabetes mellitus with other specified complication: Secondary | ICD-10-CM | POA: Diagnosis not present

## 2020-12-14 DIAGNOSIS — N1831 Chronic kidney disease, stage 3a: Secondary | ICD-10-CM | POA: Diagnosis not present

## 2020-12-14 DIAGNOSIS — E1129 Type 2 diabetes mellitus with other diabetic kidney complication: Secondary | ICD-10-CM | POA: Diagnosis not present

## 2020-12-14 DIAGNOSIS — D649 Anemia, unspecified: Secondary | ICD-10-CM | POA: Diagnosis not present

## 2020-12-14 DIAGNOSIS — E782 Mixed hyperlipidemia: Secondary | ICD-10-CM | POA: Diagnosis not present

## 2020-12-14 DIAGNOSIS — Z23 Encounter for immunization: Secondary | ICD-10-CM | POA: Diagnosis not present

## 2020-12-15 DIAGNOSIS — E785 Hyperlipidemia, unspecified: Secondary | ICD-10-CM | POA: Diagnosis not present

## 2020-12-15 DIAGNOSIS — I10 Essential (primary) hypertension: Secondary | ICD-10-CM | POA: Diagnosis not present

## 2020-12-15 DIAGNOSIS — E119 Type 2 diabetes mellitus without complications: Secondary | ICD-10-CM | POA: Diagnosis not present

## 2021-01-15 DIAGNOSIS — E1129 Type 2 diabetes mellitus with other diabetic kidney complication: Secondary | ICD-10-CM | POA: Diagnosis not present

## 2021-01-15 DIAGNOSIS — M797 Fibromyalgia: Secondary | ICD-10-CM | POA: Diagnosis not present

## 2021-01-15 DIAGNOSIS — E782 Mixed hyperlipidemia: Secondary | ICD-10-CM | POA: Diagnosis not present

## 2021-01-15 DIAGNOSIS — E1159 Type 2 diabetes mellitus with other circulatory complications: Secondary | ICD-10-CM | POA: Diagnosis not present

## 2021-01-18 DIAGNOSIS — K4090 Unilateral inguinal hernia, without obstruction or gangrene, not specified as recurrent: Secondary | ICD-10-CM | POA: Diagnosis not present

## 2021-01-18 DIAGNOSIS — R109 Unspecified abdominal pain: Secondary | ICD-10-CM | POA: Diagnosis not present

## 2021-01-18 DIAGNOSIS — Z6841 Body Mass Index (BMI) 40.0 and over, adult: Secondary | ICD-10-CM | POA: Diagnosis not present

## 2021-01-18 DIAGNOSIS — Z139 Encounter for screening, unspecified: Secondary | ICD-10-CM | POA: Diagnosis not present

## 2021-01-25 DIAGNOSIS — D509 Iron deficiency anemia, unspecified: Secondary | ICD-10-CM | POA: Diagnosis not present

## 2021-01-25 DIAGNOSIS — E538 Deficiency of other specified B group vitamins: Secondary | ICD-10-CM | POA: Diagnosis not present

## 2021-01-25 DIAGNOSIS — K31819 Angiodysplasia of stomach and duodenum without bleeding: Secondary | ICD-10-CM | POA: Diagnosis not present

## 2021-01-25 DIAGNOSIS — K5790 Diverticulosis of intestine, part unspecified, without perforation or abscess without bleeding: Secondary | ICD-10-CM | POA: Diagnosis not present

## 2021-01-25 DIAGNOSIS — K219 Gastro-esophageal reflux disease without esophagitis: Secondary | ICD-10-CM | POA: Diagnosis not present

## 2021-02-02 ENCOUNTER — Telehealth: Payer: Self-pay

## 2021-02-02 DIAGNOSIS — K4090 Unilateral inguinal hernia, without obstruction or gangrene, not specified as recurrent: Secondary | ICD-10-CM | POA: Diagnosis not present

## 2021-02-02 NOTE — Telephone Encounter (Signed)
° °  [facility] Pre-operative Risk Assessment    Request for surgical clearance:  What type of surgery is being performed? Right Inguinal Hernia with Mesh   When is this surgery scheduled? 02/20/2021   What type of clearance is required (medical clearance vs. Pharmacy clearance to hold med vs. Both)? Both  Are there any medications that need to be held prior to surgery and how long?Plavix holding length not specified.    Practice name and name of physician performing surgery? Dr. Lilia Pro at [facility]    What is your office phone number: [phone]    7.   What is your office fax number: [phone]  8.   Anesthesia type (None, local, MAC, general) ? General Anesthesia    Gloria Sharp 02/02/2021, 4:35 PM  _________________________________________________________________   (provider comments below)

## 2021-02-03 NOTE — Telephone Encounter (Signed)
Pt is scheduled to see Dr. Agustin Cree, 02/13/21 and clearance will be addressed at that time.  Will route back to the requesting surgeon's office to make them aware.

## 2021-02-03 NOTE — Telephone Encounter (Signed)
Primary Cardiologist:Robert Agustin Cree, MD  Chart reviewed as part of pre-operative protocol coverage. Because of Kaile R Lattner's past medical history and time since last visit, he/she will require a follow-up visit in order to better assess preoperative cardiovascular risk.  Pre-op covering staff: - Please schedule appointment and call patient to inform them. - Please contact requesting surgeon's office via preferred method (i.e, phone, fax) to inform them of need for appointment prior to surgery.  If applicable, this message will also be routed to pharmacy pool and/or primary cardiologist for input on holding anticoagulant/antiplatelet agent as requested below so that this information is available at time of patient's appointment.   Deberah Pelton, NP  02/03/2021, 8:58 AM

## 2021-02-10 ENCOUNTER — Other Ambulatory Visit: Payer: Self-pay

## 2021-02-13 ENCOUNTER — Ambulatory Visit: Payer: Medicare HMO | Admitting: Cardiology

## 2021-02-13 ENCOUNTER — Other Ambulatory Visit: Payer: Self-pay

## 2021-02-13 ENCOUNTER — Encounter: Payer: Self-pay | Admitting: Cardiology

## 2021-02-13 VITALS — BP 140/66 | HR 72 | Ht 61.0 in | Wt 216.2 lb

## 2021-02-13 DIAGNOSIS — I10 Essential (primary) hypertension: Secondary | ICD-10-CM | POA: Diagnosis not present

## 2021-02-13 DIAGNOSIS — E785 Hyperlipidemia, unspecified: Secondary | ICD-10-CM | POA: Diagnosis not present

## 2021-02-13 DIAGNOSIS — I350 Nonrheumatic aortic (valve) stenosis: Secondary | ICD-10-CM | POA: Diagnosis not present

## 2021-02-13 DIAGNOSIS — E119 Type 2 diabetes mellitus without complications: Secondary | ICD-10-CM | POA: Diagnosis not present

## 2021-02-13 DIAGNOSIS — I4729 Other ventricular tachycardia: Secondary | ICD-10-CM | POA: Diagnosis not present

## 2021-02-13 NOTE — Progress Notes (Signed)
Cardiology Office Note:    Date:  02/13/2021   ID:  Gloria Sharp, DOB 03/15/1945, MRN [id number]  PCP:  Maryella Shivers, MD  Cardiologist:  Jenne Campus, MD    Referring MD: Maryella Shivers, MD   Chief Complaint  Patient presents with   Clearance 02/20/21    R hernia R side Dr. Lilia Pro    History of Present Illness:    Gloria Sharp is a 76 y.o. female with past medical history significant for aortic stenosis which was last assessed in September 2022 as moderate with calculated aortic valve area 1.09, mean gradient 17, also nonsustained ventricular tachycardia, essential hypertension, diabetes, dyslipidemia. She was found to have hernia which required surgical intervention she was sent to [place] for evaluation before procedure from cardiac standpoint reviewed.  She is doing well cardiac wise but her ability to exercise very limited.  There is no dizziness no passing out there is no palpitations but walking to [employer] bring significant shortness of breath.  Months ago she was very short of breath she was found to have significant anemia that has been improved and corrected she was find to have some GI bleeding.  Past Medical History:  Diagnosis Date   Aortic stenosis 09/04/2019   CVA (cerebral vascular accident) [facility]) 2015?   Cystocele with uterine prolapse 01/17/2018   Diabetes ([place])    Dyslipidemia 10/10/2018   Essential hypertension 10/10/2018   Fibromyalgia    GERD (gastroesophageal reflux disease)    Heart murmur 10/10/2018   History of CVA (cerebrovascular accident) 10/10/2018   Hypercholesterolemia    Hypertension    Leukocytosis    Mitral regurgitation 11/24/2018   Nonsustained ventricular tachycardia 11/24/2018   Obesity    Post-operative nausea and vomiting    "hard to wake up"   Rectocele 01/17/2018   Type 2 diabetes mellitus without complication, without long-term current use of insulin ([place]) 10/10/2018    Past Surgical History:  Procedure Laterality Date   ABDOMINAL  HYSTERECTOMY     partial, left the ovaries   APPENDECTOMY     COLONOSCOPY  11/11/2013   Colonioc polyps status post polypectomy. Pancolonic diverticulosis predominately in the sigmoid colon   DILATION AND CURETTAGE OF UTERUS     GALLBLADDER SURGERY Right 07/2014   HERNIA REPAIR     Umbilical   vaginal polyp removal      Current Medications: Current Meds  Medication Sig   acetaminophen (TYLENOL) 650 MG CR tablet Take 650 mg by mouth every 8 (eight) hours as needed for pain. 2 tablets BID   alendronate (FOSAMAX) 35 MG tablet Take 35 mg by mouth every 7 (seven) days. Take with a full glass of water on an empty stomach.   brimonidine (ALPHAGAN) 0.15 % ophthalmic solution Place 1 drop into both eyes 2 (two) times daily.   cholestyramine (QUESTRAN) 4 g packet Take 1 packet (4 g total) by mouth daily. Take at least 2 hours before or after rest of the medications   clobetasol cream (TEMOVATE) 0.86 % Apply 1 application topically as directed.   clopidogrel (PLAVIX) 75 MG tablet Take 75 mg by mouth daily.   fenofibrate (TRICOR) 145 MG tablet Take 145 mg by mouth daily.   furosemide (LASIX) 20 MG tablet Take 1 tablet (20 mg total) by mouth daily.   gabapentin (NEURONTIN) 300 MG capsule Take 300 mg by mouth 2 (two) times daily.   lisinopril-hydrochlorothiazide (ZESTORETIC) 20-12.5 MG tablet Take 1 tablet by mouth daily.   MAGNESIUM PO Take  500 mg by mouth daily.   metFORMIN (GLUCOPHAGE) 1000 MG tablet Take 1,000 mg by mouth 2 (two) times daily with a meal.   metoprolol succinate (TOPROL-XL) 100 MG 24 hr tablet TAKE 1 TABLET BY MOUTH ONCE DAILY WITH OR IMMEDIATELY FOLLOWING A MEAL. (Patient taking differently: Take 100 mg by mouth daily.)   Omega-3 Fatty Acids (FISH OIL PO) Take 2,000 mg by mouth daily.   pantoprazole (PROTONIX) 40 MG tablet Take 40 mg by mouth daily as needed (for reflux).    permethrin (ELIMITE) 5 % cream Apply 1 application topically as needed for itching. For Lichen Chronicus    pioglitazone (ACTOS) 15 MG tablet Take 15 mg by mouth daily.   pravastatin (PRAVACHOL) 20 MG tablet Take 20 mg by mouth every Sunday.    Semaglutide, 1 MG/DOSE, (OZEMPIC, 1 MG/DOSE,) 2 MG/1.5ML SOPN Inject 1 mg into the skin every Wednesday.    TURMERIC PO Take 1 tablet by mouth daily.   vitamin B-12 (CYANOCOBALAMIN) 1000 MCG tablet Take 1,000 mcg by mouth daily.     Allergies:   Celecoxib, Nabumetone, Ciprofloxacin, and Flagyl [metronidazole]   Social History   Socioeconomic History   Marital status: Married    Spouse name: Not on file   Number of children: Not on file   Years of education: Not on file   Highest education level: Not on file  Occupational History   Not on file  Tobacco Use   Smoking status: Never   Smokeless tobacco: Never  Vaping Use   Vaping Use: Never used  Substance and Sexual Activity   Alcohol use: Not Currently   Drug use: Never   Sexual activity: Not on file  Other Topics Concern   Not on file  Social History Narrative   Not on file   Social Determinants of Health   Financial Resource Strain: Not on file  Food Insecurity: Not on file  Transportation Needs: Not on file  Physical Activity: Not on file  Stress: Not on file  Social Connections: Not on file     Family History: The patient's family history includes Cancer in her mother; Diabetes in her mother; Heart disease in her father; Heart failure in her mother; Hypertension in her father and mother; Stroke in her mother. There is no history of Colon cancer, Esophageal cancer, Stomach cancer, Rectal cancer, or Colon polyps. ROS:   Please see the history of present illness.    All 14 point review of systems negative except as described per history of present illness  EKGs/Labs/Other Studies Reviewed:      Recent Labs: 03/08/2020: NT-Pro BNP 566 04/04/2020: BUN 30; Creatinine, Ser 1.19; Potassium 4.4; Sodium 141  Recent Lipid Panel No results found for: CHOL, TRIG, HDL, CHOLHDL, VLDL,  LDLCALC, LDLDIRECT  Physical Exam:    VS:  BP 140/66 (BP Location: Right Arm, Patient Position: Sitting)    Pulse 72    Ht 5\' 1"  (1.549 m)    Wt 216 lb 3.2 oz (98.1 kg)    SpO2 95%    BMI 40.85 kg/m     Wt Readings from Last 3 Encounters:  02/13/21 216 lb 3.2 oz (98.1 kg)  08/11/20 211 lb 9.6 oz (96 kg)  08/10/20 213 lb (96.6 kg)     GEN:  Well nourished, well developed in no acute distress HEENT: Normal NECK: No JVD; No carotid bruits LYMPHATICS: No lymphadenopathy CARDIAC: RRR, systolic ejection murmur grade 3/6, S2 is still present.  It is mid peaking, no  rubs, no gallops RESPIRATORY:  Clear to auscultation without rales, wheezing or rhonchi  ABDOMEN: Soft, non-tender, non-distended MUSCULOSKELETAL:  No edema; No deformity  SKIN: Warm and dry LOWER EXTREMITIES: no swelling NEUROLOGIC:  Alert and oriented x 3 PSYCHIATRIC:  Normal affect   ASSESSMENT:    1. Nonrheumatic aortic valve stenosis   2. Essential hypertension   3. Nonsustained ventricular tachycardia   4. Dyslipidemia   5. Type 2 diabetes mellitus without complication, without long-term current use of insulin (HCC)    PLAN:    In order of problems listed above:  Cardiovascular evaluation before abdominal surgery for hernia.  Higher echocardiogram reviewed from September aortic stenosis moderate.,  However her ability to exercise very limited because of hip problem.  We need to rule out potential coronary artery disease.  Last evaluation for that was done 2 years ago in form of stress test.  I will schedule her to have Lexiscan to make sure she does not have any inducible ischemia. Essential hypertension blood pressure well controlled continue present management. Dyslipidemia I did review K PN which show me her LDL 64 HDL 48 this is from 12/06/2020 continue present management. Diabetes seems to be well controlled last hemoglobin A1c 6.3 which is from 12/06/2020.  If her stress test is normal without evidence of  ischemia she would be acceptable candidate for surgery from cardiac standpoint reviewed.   Medication Adjustments/Labs and Tests Ordered: Current medicines are reviewed at length with the patient today.  Concerns regarding medicines are outlined above.  No orders of the defined types were placed in this encounter.  Medication changes: No orders of the defined types were placed in this encounter.   Signed, Park Liter, MD, [facility] 02/13/2021 9:29 AM    [place]

## 2021-02-13 NOTE — Patient Instructions (Signed)
Medication Instructions:  Your physician recommends that you continue on your current medications as directed. Please refer to the Current Medication list given to you today.  *If you need a refill on your cardiac medications before your next appointment, please call your pharmacy*   Lab Work: None If you have labs (blood work) drawn today and your tests are completely normal, you will receive your results only by: [place] (if you have MyChart) OR A paper copy in the mail If you have any lab test that is abnormal or we need to change your treatment, we will call you to review the results.   Testing/Procedures: Your physician has requested that you have a lexiscan myoview. For further information please visit [email or web address]. Please follow instruction sheet, as given.     [facility] [address] [place], [place] [address] Phone:  [phone]    Please arrive 15 minutes prior to your appointment time for registration and insurance purposes.  The test will take approximately 3 to 4 hours to complete; you may bring reading material.  If someone comes with you to your appointment, they will need to remain in the main lobby due to limited space in the testing area. **If you are pregnant or breastfeeding, please notify the nuclear lab prior to your appointment**  How to prepare for your Myocardial Perfusion Test: Do not eat or drink 3 hours prior to your test, except you may have water. Do not consume products containing caffeine (regular or decaffeinated) 12 hours prior to your test. (ex: coffee, chocolate, sodas, tea). Do bring a list of your current medications with you.  If not listed below, you may take your medications as normal. HOLD diabetic medication/insulin the morning of the test: Metformin, Take half of long acting in Do wear comfortable clothes (no dresses or overalls) and walking shoes, tennis shoes preferred (No heels or open toe  shoes are allowed). Do NOT wear cologne, perfume, aftershave, or lotions (deodorant is allowed). If these instructions are not followed, your test will have to be rescheduled.  Please report to [address] for your test.  If you have questions or concerns about your appointment, you can call the [facility] at [phone].  If you cannot keep your appointment, please provide 24 hours notification to the Nuclear Lab, to avoid a possible $50 charge to your account.    Follow-Up: At [facility], you and your health needs are our priority.  As part of our continuing mission to provide you with exceptional heart care, we have created designated Provider Care Teams.  These Care Teams include your primary Cardiologist (physician) and Advanced Practice Providers (APPs -  Physician Assistants and Nurse Practitioners) who all work together to provide you with the care you need, when you need it.  We recommend signing up for the patient portal called "MyChart".  Sign up information is provided on this After Visit Summary.  MyChart is used to connect with patients for Virtual Visits (Telemedicine).  Patients are able to view lab/test results, encounter notes, upcoming appointments, etc.  Non-urgent messages can be sent to your provider as well.   To learn more about what you can do with MyChart, go to [email or web address].    Your next appointment:   5 month(s)  The format for your next appointment:   In Person  Provider:   Jenne Campus, MD    Other Instructions

## 2021-02-14 ENCOUNTER — Telehealth: Payer: Self-pay | Admitting: *Deleted

## 2021-02-14 NOTE — Telephone Encounter (Signed)
Left message on voicemail per DPR in reference to upcoming appointment scheduled on 02/15/21 at 1130 with detailed instructions given per Myocardial Perfusion Study Information Sheet for the test. LM to arrive 15 minutes early, and that it is imperative to arrive on time for appointment to keep from having the test rescheduled. If you need to cancel or reschedule your appointment, please call the office within 24 hours of your appointment. Failure to do so may result in a cancellation of your appointment, and a $50 no show fee. Phone number given for call back for any questions. No mychart available. Jadis Pitter, [place]

## 2021-02-15 ENCOUNTER — Other Ambulatory Visit: Payer: Self-pay

## 2021-02-15 ENCOUNTER — Ambulatory Visit (INDEPENDENT_AMBULATORY_CARE_PROVIDER_SITE_OTHER): Payer: Medicare HMO

## 2021-02-15 DIAGNOSIS — I4729 Other ventricular tachycardia: Secondary | ICD-10-CM | POA: Diagnosis not present

## 2021-02-15 DIAGNOSIS — E1129 Type 2 diabetes mellitus with other diabetic kidney complication: Secondary | ICD-10-CM | POA: Diagnosis not present

## 2021-02-15 DIAGNOSIS — M797 Fibromyalgia: Secondary | ICD-10-CM | POA: Diagnosis not present

## 2021-02-15 DIAGNOSIS — I10 Essential (primary) hypertension: Secondary | ICD-10-CM | POA: Diagnosis not present

## 2021-02-15 DIAGNOSIS — E785 Hyperlipidemia, unspecified: Secondary | ICD-10-CM | POA: Diagnosis not present

## 2021-02-15 DIAGNOSIS — E119 Type 2 diabetes mellitus without complications: Secondary | ICD-10-CM | POA: Diagnosis not present

## 2021-02-15 DIAGNOSIS — I350 Nonrheumatic aortic (valve) stenosis: Secondary | ICD-10-CM | POA: Diagnosis not present

## 2021-02-15 DIAGNOSIS — E782 Mixed hyperlipidemia: Secondary | ICD-10-CM | POA: Diagnosis not present

## 2021-02-15 DIAGNOSIS — E1159 Type 2 diabetes mellitus with other circulatory complications: Secondary | ICD-10-CM | POA: Diagnosis not present

## 2021-02-15 LAB — MYOCARDIAL PERFUSION IMAGING
LV dias vol: 72 mL (ref 46–106)
LV sys vol: 22 mL
Nuc Stress EF: 70 %
Peak HR: 101 {beats}/min
Rest HR: 64 {beats}/min
Rest Nuclear Isotope Dose: 11 mCi
SDS: 9
SRS: 8
SSS: 15
Stress Nuclear Isotope Dose: 31.5 mCi
TID: 1.35

## 2021-02-15 MED ORDER — REGADENOSON 0.4 MG/5ML IV SOLN
0.4000 mg | Freq: Once | INTRAVENOUS | Status: AC
  Administered 2021-02-15: 0.4 mg via INTRAVENOUS

## 2021-02-15 MED ORDER — TECHNETIUM TC 99M TETROFOSMIN IV KIT
31.5000 | PACK | Freq: Once | INTRAVENOUS | Status: AC | PRN
  Administered 2021-02-15: 31.5 via INTRAVENOUS

## 2021-02-15 MED ORDER — TECHNETIUM TC 99M TETROFOSMIN IV KIT
11.0000 | PACK | Freq: Once | INTRAVENOUS | Status: AC | PRN
  Administered 2021-02-15: 11 via INTRAVENOUS

## 2021-02-20 DIAGNOSIS — J45909 Unspecified asthma, uncomplicated: Secondary | ICD-10-CM | POA: Diagnosis not present

## 2021-02-20 DIAGNOSIS — D175 Benign lipomatous neoplasm of intra-abdominal organs: Secondary | ICD-10-CM | POA: Diagnosis not present

## 2021-02-20 DIAGNOSIS — E1122 Type 2 diabetes mellitus with diabetic chronic kidney disease: Secondary | ICD-10-CM | POA: Diagnosis not present

## 2021-02-20 DIAGNOSIS — Z7984 Long term (current) use of oral hypoglycemic drugs: Secondary | ICD-10-CM | POA: Diagnosis not present

## 2021-02-20 DIAGNOSIS — K4090 Unilateral inguinal hernia, without obstruction or gangrene, not specified as recurrent: Secondary | ICD-10-CM | POA: Diagnosis not present

## 2021-02-20 DIAGNOSIS — N189 Chronic kidney disease, unspecified: Secondary | ICD-10-CM | POA: Diagnosis not present

## 2021-02-20 DIAGNOSIS — I129 Hypertensive chronic kidney disease with stage 1 through stage 4 chronic kidney disease, or unspecified chronic kidney disease: Secondary | ICD-10-CM | POA: Diagnosis not present

## 2021-02-20 DIAGNOSIS — E119 Type 2 diabetes mellitus without complications: Secondary | ICD-10-CM | POA: Diagnosis not present

## 2021-02-20 HISTORY — PX: HERNIA REPAIR: SHX51

## 2021-02-21 NOTE — Telephone Encounter (Signed)
error 

## 2021-02-28 ENCOUNTER — Other Ambulatory Visit: Payer: Self-pay | Admitting: Cardiology

## 2021-03-02 DIAGNOSIS — S30821A Blister (nonthermal) of abdominal wall, initial encounter: Secondary | ICD-10-CM | POA: Diagnosis not present

## 2021-03-13 DIAGNOSIS — H401131 Primary open-angle glaucoma, bilateral, mild stage: Secondary | ICD-10-CM | POA: Diagnosis not present

## 2021-03-13 DIAGNOSIS — H26493 Other secondary cataract, bilateral: Secondary | ICD-10-CM | POA: Diagnosis not present

## 2021-03-13 DIAGNOSIS — E119 Type 2 diabetes mellitus without complications: Secondary | ICD-10-CM | POA: Diagnosis not present

## 2021-03-15 DIAGNOSIS — M797 Fibromyalgia: Secondary | ICD-10-CM | POA: Diagnosis not present

## 2021-03-15 DIAGNOSIS — E1159 Type 2 diabetes mellitus with other circulatory complications: Secondary | ICD-10-CM | POA: Diagnosis not present

## 2021-03-15 DIAGNOSIS — E1129 Type 2 diabetes mellitus with other diabetic kidney complication: Secondary | ICD-10-CM | POA: Diagnosis not present

## 2021-03-15 DIAGNOSIS — E782 Mixed hyperlipidemia: Secondary | ICD-10-CM | POA: Diagnosis not present

## 2021-03-21 DIAGNOSIS — E1159 Type 2 diabetes mellitus with other circulatory complications: Secondary | ICD-10-CM | POA: Diagnosis not present

## 2021-03-21 DIAGNOSIS — E782 Mixed hyperlipidemia: Secondary | ICD-10-CM | POA: Diagnosis not present

## 2021-03-26 ENCOUNTER — Inpatient Hospital Stay (HOSPITAL_COMMUNITY): Payer: Medicare HMO | Admitting: Certified Registered Nurse Anesthetist

## 2021-03-26 ENCOUNTER — Encounter (HOSPITAL_COMMUNITY): Payer: Self-pay | Admitting: Internal Medicine

## 2021-03-26 ENCOUNTER — Inpatient Hospital Stay (HOSPITAL_COMMUNITY): Payer: Medicare HMO

## 2021-03-26 ENCOUNTER — Encounter (HOSPITAL_COMMUNITY)
Admission: AD | Disposition: A | Discharge status: Home / Self Care | Payer: Self-pay | Source: Other Acute Inpatient Hospital | Attending: Internal Medicine

## 2021-03-26 ENCOUNTER — Inpatient Hospital Stay (HOSPITAL_COMMUNITY)
Admission: AD | Admit: 2021-03-26 | Discharge: 2021-03-29 | DRG: 854 | Disposition: A | Discharge status: Home / Self Care | Payer: Medicare HMO | Source: Other Acute Inpatient Hospital | Attending: Internal Medicine | Admitting: Internal Medicine

## 2021-03-26 ENCOUNTER — Other Ambulatory Visit: Payer: Self-pay

## 2021-03-26 DIAGNOSIS — R42 Dizziness and giddiness: Secondary | ICD-10-CM | POA: Diagnosis present

## 2021-03-26 DIAGNOSIS — Z881 Allergy status to other antibiotic agents status: Secondary | ICD-10-CM

## 2021-03-26 DIAGNOSIS — E876 Hypokalemia: Secondary | ICD-10-CM | POA: Diagnosis not present

## 2021-03-26 DIAGNOSIS — Z9049 Acquired absence of other specified parts of digestive tract: Secondary | ICD-10-CM | POA: Diagnosis not present

## 2021-03-26 DIAGNOSIS — Z888 Allergy status to other drugs, medicaments and biological substances status: Secondary | ICD-10-CM

## 2021-03-26 DIAGNOSIS — Z90710 Acquired absence of both cervix and uterus: Secondary | ICD-10-CM

## 2021-03-26 DIAGNOSIS — N1330 Unspecified hydronephrosis: Secondary | ICD-10-CM | POA: Diagnosis present

## 2021-03-26 DIAGNOSIS — K219 Gastro-esophageal reflux disease without esophagitis: Secondary | ICD-10-CM | POA: Diagnosis present

## 2021-03-26 DIAGNOSIS — N179 Acute kidney failure, unspecified: Secondary | ICD-10-CM | POA: Diagnosis not present

## 2021-03-26 DIAGNOSIS — E1122 Type 2 diabetes mellitus with diabetic chronic kidney disease: Secondary | ICD-10-CM | POA: Diagnosis present

## 2021-03-26 DIAGNOSIS — N200 Calculus of kidney: Secondary | ICD-10-CM | POA: Diagnosis not present

## 2021-03-26 DIAGNOSIS — E7800 Pure hypercholesterolemia, unspecified: Secondary | ICD-10-CM | POA: Diagnosis present

## 2021-03-26 DIAGNOSIS — A419 Sepsis, unspecified organism: Secondary | ICD-10-CM | POA: Diagnosis not present

## 2021-03-26 DIAGNOSIS — Z833 Family history of diabetes mellitus: Secondary | ICD-10-CM | POA: Diagnosis not present

## 2021-03-26 DIAGNOSIS — Z7984 Long term (current) use of oral hypoglycemic drugs: Secondary | ICD-10-CM

## 2021-03-26 DIAGNOSIS — N182 Chronic kidney disease, stage 2 (mild): Secondary | ICD-10-CM | POA: Diagnosis present

## 2021-03-26 DIAGNOSIS — I10 Essential (primary) hypertension: Secondary | ICD-10-CM | POA: Diagnosis present

## 2021-03-26 DIAGNOSIS — I358 Other nonrheumatic aortic valve disorders: Secondary | ICD-10-CM | POA: Diagnosis not present

## 2021-03-26 DIAGNOSIS — E1165 Type 2 diabetes mellitus with hyperglycemia: Secondary | ICD-10-CM | POA: Diagnosis present

## 2021-03-26 DIAGNOSIS — Z8249 Family history of ischemic heart disease and other diseases of the circulatory system: Secondary | ICD-10-CM | POA: Diagnosis not present

## 2021-03-26 DIAGNOSIS — E66813 Obesity, class 3: Secondary | ICD-10-CM | POA: Diagnosis present

## 2021-03-26 DIAGNOSIS — Z7983 Long term (current) use of bisphosphonates: Secondary | ICD-10-CM

## 2021-03-26 DIAGNOSIS — N201 Calculus of ureter: Secondary | ICD-10-CM | POA: Diagnosis not present

## 2021-03-26 DIAGNOSIS — Z7902 Long term (current) use of antithrombotics/antiplatelets: Secondary | ICD-10-CM

## 2021-03-26 DIAGNOSIS — Z8673 Personal history of transient ischemic attack (TIA), and cerebral infarction without residual deficits: Secondary | ICD-10-CM | POA: Diagnosis not present

## 2021-03-26 DIAGNOSIS — N136 Pyonephrosis: Secondary | ICD-10-CM | POA: Diagnosis not present

## 2021-03-26 DIAGNOSIS — Z79899 Other long term (current) drug therapy: Secondary | ICD-10-CM

## 2021-03-26 DIAGNOSIS — N132 Hydronephrosis with renal and ureteral calculous obstruction: Secondary | ICD-10-CM

## 2021-03-26 DIAGNOSIS — I080 Rheumatic disorders of both mitral and aortic valves: Secondary | ICD-10-CM | POA: Diagnosis not present

## 2021-03-26 DIAGNOSIS — N210 Calculus in bladder: Secondary | ICD-10-CM

## 2021-03-26 DIAGNOSIS — N390 Urinary tract infection, site not specified: Secondary | ICD-10-CM | POA: Diagnosis not present

## 2021-03-26 DIAGNOSIS — M797 Fibromyalgia: Secondary | ICD-10-CM | POA: Diagnosis present

## 2021-03-26 DIAGNOSIS — Z6841 Body Mass Index (BMI) 40.0 and over, adult: Secondary | ICD-10-CM | POA: Diagnosis not present

## 2021-03-26 DIAGNOSIS — Z823 Family history of stroke: Secondary | ICD-10-CM

## 2021-03-26 DIAGNOSIS — I129 Hypertensive chronic kidney disease with stage 1 through stage 4 chronic kidney disease, or unspecified chronic kidney disease: Secondary | ICD-10-CM | POA: Diagnosis present

## 2021-03-26 DIAGNOSIS — R188 Other ascites: Secondary | ICD-10-CM | POA: Diagnosis not present

## 2021-03-26 DIAGNOSIS — E119 Type 2 diabetes mellitus without complications: Secondary | ICD-10-CM | POA: Diagnosis not present

## 2021-03-26 HISTORY — DX: Morbid (severe) obesity due to excess calories: E66.01

## 2021-03-26 HISTORY — DX: Urinary tract infection, site not specified: A41.9

## 2021-03-26 HISTORY — DX: Unspecified hydronephrosis: N13.30

## 2021-03-26 HISTORY — PX: CYSTOSCOPY W/ URETERAL STENT PLACEMENT: SHX1429

## 2021-03-26 HISTORY — DX: Obesity, class 3: E66.813

## 2021-03-26 HISTORY — DX: Type 2 diabetes mellitus with hyperglycemia: E11.65

## 2021-03-26 LAB — CBC WITH DIFFERENTIAL/PLATELET
Abs Immature Granulocytes: 0.26 10*3/uL — ABNORMAL HIGH (ref 0.00–0.07)
Basophils Absolute: 0.1 10*3/uL (ref 0.0–0.1)
Basophils Relative: 0 %
Eosinophils Absolute: 0 10*3/uL (ref 0.0–0.5)
Eosinophils Relative: 0 %
HCT: 33 % — ABNORMAL LOW (ref 36.0–46.0)
Hemoglobin: 10.7 g/dL — ABNORMAL LOW (ref 12.0–15.0)
Immature Granulocytes: 1 %
Lymphocytes Relative: 2 %
Lymphs Abs: 0.5 10*3/uL — ABNORMAL LOW (ref 0.7–4.0)
MCH: 29.2 pg (ref 26.0–34.0)
MCHC: 32.4 g/dL (ref 30.0–36.0)
MCV: 90.2 fL (ref 80.0–100.0)
Monocytes Absolute: 1.4 10*3/uL — ABNORMAL HIGH (ref 0.1–1.0)
Monocytes Relative: 6 %
Neutro Abs: 23.1 10*3/uL — ABNORMAL HIGH (ref 1.7–7.7)
Neutrophils Relative %: 91 %
Platelet Morphology: NORMAL
Platelets: 359 10*3/uL (ref 150–400)
RBC: 3.66 MIL/uL — ABNORMAL LOW (ref 3.87–5.11)
RDW: 13.8 % (ref 11.5–15.5)
WBC: 25.3 10*3/uL — ABNORMAL HIGH (ref 4.0–10.5)
nRBC: 0 % (ref 0.0–0.2)

## 2021-03-26 LAB — GLUCOSE, CAPILLARY
Glucose-Capillary: 122 mg/dL — ABNORMAL HIGH (ref 70–99)
Glucose-Capillary: 110 mg/dL — ABNORMAL HIGH (ref 70–99)
Glucose-Capillary: 174 mg/dL — ABNORMAL HIGH (ref 70–99)

## 2021-03-26 LAB — COMPREHENSIVE METABOLIC PANEL
ALT: 16 U/L (ref 0–44)
AST: 24 U/L (ref 15–41)
Albumin: 3.1 g/dL — ABNORMAL LOW (ref 3.5–5.0)
Alkaline Phosphatase: 39 U/L (ref 38–126)
Anion gap: 6 (ref 5–15)
BUN: 25 mg/dL — ABNORMAL HIGH (ref 8–23)
CO2: 23 mmol/L (ref 22–32)
Calcium: 8.2 mg/dL — ABNORMAL LOW (ref 8.9–10.3)
Chloride: 106 mmol/L (ref 98–111)
Creatinine, Ser: 1.2 mg/dL — ABNORMAL HIGH (ref 0.44–1.00)
GFR, Estimated: 48 mL/min — ABNORMAL LOW (ref >60)
Glucose, Bld: 163 mg/dL — ABNORMAL HIGH (ref 70–99)
Potassium: 3.2 mmol/L — ABNORMAL LOW (ref 3.5–5.1)
Sodium: 135 mmol/L (ref 135–145)
Total Bilirubin: 0.4 mg/dL (ref 0.3–1.2)
Total Protein: 5.6 g/dL — ABNORMAL LOW (ref 6.5–8.1)

## 2021-03-26 LAB — PROTIME-INR
INR: 1.3 — ABNORMAL HIGH (ref 0.8–1.2)
Prothrombin Time: 16.5 seconds — ABNORMAL HIGH (ref 11.4–15.2)

## 2021-03-26 LAB — LACTIC ACID, PLASMA
Lactic Acid, Venous: 1.4 mmol/L (ref 0.5–1.9)
Lactic Acid, Venous: 2.6 mmol/L (ref 0.5–1.9)

## 2021-03-26 LAB — APTT: aPTT: 33 seconds (ref 24–36)

## 2021-03-26 SURGERY — CYSTOSCOPY, WITH RETROGRADE PYELOGRAM AND URETERAL STENT INSERTION
Anesthesia: General | Site: Ureter | Laterality: Right

## 2021-03-26 MED ORDER — LACTATED RINGERS IV SOLN: INTRAVENOUS | Status: DC | PRN

## 2021-03-26 MED ORDER — PHENYLEPHRINE HCL (PRESSORS) 10 MG/ML IV SOLN
INTRAVENOUS | Status: AC
  Filled 2021-03-26: qty 1

## 2021-03-26 MED ORDER — FENTANYL CITRATE (PF) 100 MCG/2ML IJ SOLN
INTRAMUSCULAR | Status: DC | PRN
  Administered 2021-03-26 (×2): 25 ug via INTRAVENOUS

## 2021-03-26 MED ORDER — FENTANYL CITRATE (PF) 100 MCG/2ML IJ SOLN
INTRAMUSCULAR | Status: AC
  Filled 2021-03-26: qty 2

## 2021-03-26 MED ORDER — ONDANSETRON HCL 4 MG/2ML IJ SOLN
INTRAMUSCULAR | Status: DC | PRN
  Administered 2021-03-26: 4 mg via INTRAVENOUS

## 2021-03-26 MED ORDER — PHENYLEPHRINE HCL-NACL 20-0.9 MG/250ML-% IV SOLN
INTRAVENOUS | Status: DC | PRN
  Administered 2021-03-26: 50 ug/min via INTRAVENOUS

## 2021-03-26 MED ORDER — HYDROMORPHONE HCL 1 MG/ML IJ SOLN
1.0000 mg | INTRAMUSCULAR | Status: DC | PRN
  Administered 2021-03-26: 1 mg via INTRAVENOUS
  Filled 2021-03-26: qty 1

## 2021-03-26 MED ORDER — ROCURONIUM BROMIDE 10 MG/ML (PF) SYRINGE
PREFILLED_SYRINGE | INTRAVENOUS | Status: AC
  Filled 2021-03-26: qty 10

## 2021-03-26 MED ORDER — LACTATED RINGERS IV BOLUS (SEPSIS)
1000.0000 mL | Freq: Once | INTRAVENOUS | Status: AC
  Administered 2021-03-26: 1000 mL via INTRAVENOUS

## 2021-03-26 MED ORDER — AMISULPRIDE (ANTIEMETIC) 5 MG/2ML IV SOLN: 10.0000 mg | Freq: Once | INTRAVENOUS | Status: DC | PRN

## 2021-03-26 MED ORDER — LIDOCAINE HCL (PF) 2 % IJ SOLN
INTRAMUSCULAR | Status: AC
  Filled 2021-03-26: qty 5

## 2021-03-26 MED ORDER — PROPOFOL 10 MG/ML IV BOLUS
INTRAVENOUS | Status: AC
Start: 2021-03-26 — End: ?
  Filled 2021-03-26: qty 20

## 2021-03-26 MED ORDER — STERILE WATER FOR IRRIGATION IR SOLN
Status: DC | PRN
  Administered 2021-03-26: 3000 mL

## 2021-03-26 MED ORDER — ACETAMINOPHEN 325 MG PO TABS
650.0000 mg | ORAL_TABLET | Freq: Four times a day (QID) | ORAL | Status: DC | PRN
  Administered 2021-03-26 – 2021-03-28 (×3): 650 mg via ORAL
  Filled 2021-03-26 (×3): qty 2

## 2021-03-26 MED ORDER — SUCCINYLCHOLINE CHLORIDE 200 MG/10ML IV SOSY
PREFILLED_SYRINGE | INTRAVENOUS | Status: DC | PRN
  Administered 2021-03-26: 140 mg via INTRAVENOUS

## 2021-03-26 MED ORDER — SODIUM CHLORIDE 0.9 % IV SOLN
INTRAVENOUS | Status: AC
  Administered 2021-03-26: 2 g
  Filled 2021-03-26: qty 20

## 2021-03-26 MED ORDER — ONDANSETRON HCL 4 MG/2ML IJ SOLN
INTRAMUSCULAR | Status: AC
  Filled 2021-03-26: qty 2

## 2021-03-26 MED ORDER — HYDROMORPHONE HCL 1 MG/ML IJ SOLN: 0.5000 mg | INTRAMUSCULAR | Status: DC | PRN

## 2021-03-26 MED ORDER — LACTATED RINGERS IV SOLN: INTRAVENOUS | Status: DC

## 2021-03-26 MED ORDER — VANCOMYCIN HCL IN DEXTROSE 1-5 GM/200ML-% IV SOLN
1000.0000 mg | Freq: Once | INTRAVENOUS | Status: AC
  Administered 2021-03-26: 1000 mg via INTRAVENOUS
  Filled 2021-03-26: qty 200

## 2021-03-26 MED ORDER — IOHEXOL 300 MG/ML  SOLN
INTRAMUSCULAR | Status: DC | PRN
  Administered 2021-03-26: 8 mL

## 2021-03-26 MED ORDER — DEXAMETHASONE SODIUM PHOSPHATE 10 MG/ML IJ SOLN
INTRAMUSCULAR | Status: AC
  Filled 2021-03-26: qty 1

## 2021-03-26 MED ORDER — BRIMONIDINE TARTRATE 0.15 % OP SOLN
1.0000 [drp] | Freq: Two times a day (BID) | OPHTHALMIC | Status: DC
  Administered 2021-03-26 – 2021-03-29 (×6): 1 [drp] via OPHTHALMIC
  Filled 2021-03-26: qty 5

## 2021-03-26 MED ORDER — ROCURONIUM BROMIDE 100 MG/10ML IV SOLN
INTRAVENOUS | Status: DC | PRN
  Administered 2021-03-26: 10 mg via INTRAVENOUS

## 2021-03-26 MED ORDER — PHENYLEPHRINE 40 MCG/ML (10ML) SYRINGE FOR IV PUSH (FOR BLOOD PRESSURE SUPPORT)
PREFILLED_SYRINGE | INTRAVENOUS | Status: AC
  Filled 2021-03-26: qty 10

## 2021-03-26 MED ORDER — LIDOCAINE HCL (CARDIAC) PF 100 MG/5ML IV SOSY
PREFILLED_SYRINGE | INTRAVENOUS | Status: DC | PRN
  Administered 2021-03-26: 60 mg via INTRAVENOUS

## 2021-03-26 MED ORDER — ACETAMINOPHEN 650 MG RE SUPP: 650.0000 mg | Freq: Four times a day (QID) | RECTAL | Status: DC | PRN

## 2021-03-26 MED ORDER — PROPOFOL 10 MG/ML IV BOLUS
INTRAVENOUS | Status: DC | PRN
  Administered 2021-03-26: 80 mg via INTRAVENOUS

## 2021-03-26 MED ORDER — SODIUM CHLORIDE 0.9 % IV SOLN
2.0000 g | INTRAVENOUS | Status: DC
  Administered 2021-03-26 – 2021-03-29 (×4): 2 g via INTRAVENOUS
  Filled 2021-03-26 (×4): qty 20

## 2021-03-26 MED ORDER — PANTOPRAZOLE SODIUM 40 MG PO TBEC: 40.0000 mg | DELAYED_RELEASE_TABLET | Freq: Every day | ORAL | Status: DC | PRN

## 2021-03-26 MED ORDER — FENTANYL CITRATE PF 50 MCG/ML IJ SOSY: 25.0000 ug | PREFILLED_SYRINGE | INTRAMUSCULAR | Status: DC | PRN

## 2021-03-26 MED ORDER — PHENYLEPHRINE 40 MCG/ML (10ML) SYRINGE FOR IV PUSH (FOR BLOOD PRESSURE SUPPORT)
PREFILLED_SYRINGE | INTRAVENOUS | Status: DC | PRN
  Administered 2021-03-26: 120 ug via INTRAVENOUS
  Administered 2021-03-26: 160 ug via INTRAVENOUS

## 2021-03-26 MED ORDER — OXYCODONE HCL 5 MG PO TABS
5.0000 mg | ORAL_TABLET | ORAL | Status: DC | PRN
Start: 2021-03-26 — End: 2021-03-29
  Administered 2021-03-27 – 2021-03-28 (×2): 5 mg via ORAL
  Filled 2021-03-26 (×2): qty 1

## 2021-03-26 MED ORDER — INSULIN ASPART 100 UNIT/ML IJ SOLN
0.0000 [IU] | Freq: Three times a day (TID) | INTRAMUSCULAR | Status: DC
  Administered 2021-03-27 – 2021-03-29 (×4): 2 [IU] via SUBCUTANEOUS

## 2021-03-26 MED ORDER — SUCCINYLCHOLINE CHLORIDE 200 MG/10ML IV SOSY
PREFILLED_SYRINGE | INTRAVENOUS | Status: AC
  Filled 2021-03-26: qty 10

## 2021-03-26 MED ORDER — DEXAMETHASONE SODIUM PHOSPHATE 10 MG/ML IJ SOLN
INTRAMUSCULAR | Status: DC | PRN
  Administered 2021-03-26: 5 mg via INTRAVENOUS

## 2021-03-26 MED ORDER — PROCHLORPERAZINE EDISYLATE 10 MG/2ML IJ SOLN
5.0000 mg | INTRAMUSCULAR | Status: DC | PRN
  Filled 2021-03-26: qty 2

## 2021-03-26 SURGICAL SUPPLY — 13 items
BAG URO CATCHER STRL LF (MISCELLANEOUS) ×3 IMPLANT
CATH URETL OPEN 5X70 (CATHETERS) IMPLANT
CLOTH BEACON ORANGE TIMEOUT ST (SAFETY) ×3 IMPLANT
GLOVE SURG ENC TEXT LTX SZ7 (GLOVE) ×3 IMPLANT
GUIDEWIRE STR DUAL SENSOR (WIRE) ×3 IMPLANT
GUIDEWIRE ZIPWRE .038 STRAIGHT (WIRE) IMPLANT
MANIFOLD NEPTUNE II (INSTRUMENTS) ×3 IMPLANT
PACK CYSTO (CUSTOM PROCEDURE TRAY) ×3 IMPLANT
STENT URET 6FRX24 CONTOUR (STENTS) ×2 IMPLANT
SYR 10ML LL (SYRINGE) ×3 IMPLANT
TUBING CONNECTING 10 (TUBING) ×2 IMPLANT
TUBING CONNECTING 10' (TUBING) ×1
TUBING UROLOGY SET (TUBING) IMPLANT

## 2021-03-26 NOTE — Discharge Instructions (Signed)
[facility] [phone] Post Stent Instructions  Definitions:  Ureter: The duct that transports urine from the kidney to the bladder. Stent:   A plastic hollow tube that is placed into the ureter, from the kidney to the bladder to prevent the ureter from swelling shut.  GENERAL INSTRUCTIONS:  Despite the fact that no skin incisions were used, the area around the ureter and bladder is raw and irritated. The stent is a foreign body which will further irritate the bladder wall. This irritation is manifested by increased frequency of urination, both day and night, and by an increase in the urge to urinate. In some, the urge to urinate is present almost always. Sometimes the urge is strong enough that you may not be able to stop yourself from urinating. The only real cure is to remove the stent and then give time for the bladder wall to heal which can't be done until the danger of the ureter swelling shut has passed, which varies.  You may see some blood in your urine while the stent is in place and a few days afterwards. Do not be alarmed, even if the urine was clear for a while. Get off your feet and drink lots of fluids until clearing occurs. If you start to pass clots or don't improve, call us.  DIET: You may return to your normal diet immediately. Because of the raw surface of your bladder, alcohol, spicy foods, acid type foods and drinks with caffeine may cause irritation or frequency and should be used in moderation. To keep your urine flowing freely and to avoid constipation, drink plenty of fluids during the day ( 8-10 glasses ). Tip: Avoid cranberry juice because it is very acidic.  ACTIVITY: Your physical activity doesn't need to be restricted. However, if you are very active, you may see some blood in your urine. We suggest that you reduce your activity under these circumstances until the bleeding has stopped.  BOWELS: It is important to keep your bowels regular during  the postoperative period. Straining with bowel movements can cause bleeding. A bowel movement every other day is reasonable. Use a mild laxative if needed, such as Milk of Magnesia 2-3 tablespoons, or 2 Dulcolax tablets. Call if you continue to have problems. If you have been taking narcotics for pain, before, during or after your surgery, you may be constipated. Take a laxative if necessary.   MEDICATION: You should resume your pre-surgery medications unless told not to. In addition you will often be given an antibiotic to prevent infection. These should be taken as prescribed until the bottles are finished unless you are having an unusual reaction to one of the drugs.  PROBLEMS YOU SHOULD REPORT TO US: Fevers over 100.5 Fahrenheit. Heavy bleeding, or clots ( See above notes about blood in urine ). Inability to urinate. Drug reactions ( hives, rash, nausea, vomiting, diarrhea ). Severe burning or pain with urination that is not improving.  FOLLOW-UP: You will need a follow-up appointment to monitor your progress. Call for this appointment at the number listed above. Usually the first appointment will be about three to fourteen days after your surgery.  

## 2021-03-26 NOTE — Transfer of Care (Signed)
Immediate Anesthesia Transfer of Care Note ? ?Patient: Gloria Sharp ? ?Procedure(s) Performed: CYSTOSCOPY WITH RETROGRADE PYELOGRAM/URETERAL STENT PLACEMENT (Right: Ureter) ? ?Patient Location: PACU ? ?Anesthesia Type:General ? ?Level of Consciousness: awake, drowsy and patient cooperative ? ?Airway & Oxygen Therapy: Patient Spontanous Breathing and Patient connected to face mask oxygen ? ?Post-op Assessment: Report given to RN and Post -op Vital signs reviewed and stable ? ?Post vital signs: Reviewed and stable and MDA in PACU at bedside ? ?Last Vitals:  ?Vitals Value Taken Time  ?BP 134/50 03/26/21 1502  ?Temp    ?Pulse 128 03/26/21 1504  ?Resp 21 03/26/21 1504  ?SpO2 99 % 03/26/21 1504  ?Vitals shown include unvalidated device data. ? ?Last Pain:  ?Vitals:  ? 03/26/21 1351  ?TempSrc: Oral  ?   ? ?  ? ?Complications: No notable events documented. ?

## 2021-03-26 NOTE — Anesthesia Procedure Notes (Signed)
Procedure Name: Intubation ?Date/Time: 03/26/2021 2:34 PM ?Performed by: Raenette Rover, CRNA ?Pre-anesthesia Checklist: Patient identified, Emergency Drugs available, Suction available and Patient being monitored ?Patient Re-evaluated:Patient Re-evaluated prior to induction ?Oxygen Delivery Method: Circle system utilized ?Preoxygenation: Pre-oxygenation with 100% oxygen ?Induction Type: IV induction, Rapid sequence and Cricoid Pressure applied ?Laryngoscope Size: Mac and 3 ?Grade View: Grade I ?Tube type: Oral ?Tube size: 7.0 mm ?Number of attempts: 1 ?Airway Equipment and Method: Stylet ?Placement Confirmation: ETT inserted through vocal cords under direct vision, positive ETCO2 and breath sounds checked- equal and bilateral ?Secured at: 21 cm ?Tube secured with: Tape ?Dental Injury: Teeth and Oropharynx as per pre-operative assessment  ? ? ? ? ?

## 2021-03-26 NOTE — Op Note (Signed)
Operative Note ? ?Preoperative diagnosis:  ?1.  Right ureteral stone ?2. Sepsis ? ?Postoperative diagnosis: ?1.  Right ureteral stone ?2. Sepsis ? ?Procedure(s): ?1.  Cystoscopy ?2. Right retrograde pyelogram with interpretation ?3. Right ureteral stent placement ?4. Fluoroscopy <1 hour with intraoperative interpretation ?5. Foley catheter placement ? ?Surgeon: Rexene Alberts, MD ? ?Assistants:  None ? ?Anesthesia:  General ? ?Complications:  None ? ?EBL:  Minimal ? ?Specimens: ?1.  ?ID Type Source Tests Collected by Time Destination  ?A : right renal pelvis urine culture Urine Urine, Cystoscope URINE CULTURE Janith Lima, MD 03/26/2021 1446   ? ? ?Drains/Catheters: ?1.  Right 6Fr x 24cm ureteral stent ? ?Intraoperative findings:   ?Cystoscopy demonstrated no suspicious lesions. She did have a distended bladder. There was a 21m stone that was in her bladder that was drained. There was debris and evidence of cystitis. ?Right retrograde pyelogram demonstrated severe right hydronephrosis. ?Successful right ureteral stent placement with curl in the renal pelvis and bladder respectively. ? ?Indication:  Gloria LANESis a 76y.o. female with an obstructing right ureteral stone with signs of infection and early sepsis.  After reviewing the management options for treatment, she elected to proceed with the above surgical procedure(s). We have discussed the potential benefits and risks of the procedure, side effects of the proposed treatment, the likelihood of the patient achieving the goals of the procedure, and any potential problems that might occur during the procedure or recuperation. Informed consent has been obtained. ? ?Description of procedure: ?The patient was taken to the operating room and general anesthesia was induced.  The patient was placed in the dorsal lithotomy position, prepped and draped in the usual sterile fashion, and preoperative antibiotics were administered. A preoperative time-out was performed.   ? ?Cystourethroscopy was performed.  The patient?s urethra was examined and was normal. The bladder was then systematically examined in its entirety. There was no evidence for any bladder tumors or other mucosal pathology.  She had a 312mstone in the bladder that was drained. ? ?Attention then turned to the right ureteral orifice. A 0.038 zip wire was passed through the right orifice and over the wire a 5 Fr open ended catheter was inserted and passed up to the level of the renal pelvis. Aspirate was obtained and sent off as right renal pelvis urine for culture. Omnipaque contrast was injected through the ureteral catheter and a retrograde pyelogram was performed with findings as dictated above. The wire was then replaced and the open ended catheter was removed.  ? ?A 6Fr x 24cm ureteral stent was advance over the wire. The stent was positioned appropriately under fluoroscopic and cystoscopic guidance.  The wire was then removed with an adequate stent curl noted in the renal pelvis as well as in the bladder. ? ?The bladder was then emptied, foley catheter placed and the procedure ended.  The patient appeared to tolerate the procedure well and without complications.  The patient was able to be awakened and transferred to the recovery unit in satisfactory condition.  ? ?Plan:  Admit with IV abx. She will f/u for definitive treatment of stone as outpatient. ? ?Gloria R. Belma Dyches MD ?[facility]  ?Pager: [phone]?

## 2021-03-26 NOTE — Progress Notes (Signed)
?   03/26/21 2307  ?Provider Notification  ?Provider Name/Title Lovey Newcomer, NP  ?Date Provider Notified 03/26/21  ?Time Provider Notified 2307  ?Notification Type Page  ?Notification Reason Critical result  ?Test performed and critical result Lactic acid = 2.6  ?Date Critical Result Received 03/26/21  ?Time Critical Result Received 2302  ?Provider response No new orders  ?Date of Provider Response 03/26/21  ?Time of Provider Response 2308  ? ? ?

## 2021-03-26 NOTE — Anesthesia Preprocedure Evaluation (Addendum)
Anesthesia Evaluation  ?Patient identified by MRN, date of birth, ID band ?Patient awake ? ? ? ?Reviewed: ?Allergy & Precautions, NPO status , Patient's Chart, lab work & pertinent test results, reviewed documented beta blocker date and time  ? ?History of Anesthesia Complications ?(+) PROLONGED EMERGENCE and history of anesthetic complications ? ?Airway ?Mallampati: III ? ?TM Distance: >3 FB ?Neck ROM: Full ? ?Mouth opening: Limited Mouth Opening ? Dental ?no notable dental hx. ?(+) Teeth Intact, Dental Advisory Given ?  ?Pulmonary ?neg pulmonary ROS,  ?  ?Pulmonary exam normal ?breath sounds clear to auscultation ? ? ? ? ? ? Cardiovascular ?hypertension, Pt. on medications and Pt. on home beta blockers ?Normal cardiovascular exam+ Valvular Problems/Murmurs (mod AS, mild/mod MR) AS and MR  ?Rhythm:Regular Rate:Normal ?+ Systolic murmurs ?TTE 2022 ??1. Left ventricular ejection fraction, by estimation, is 60 to 65%. The  ?left ventricle has normal function. The left ventricle has no regional  ?wall motion abnormalities. There is moderate concentric left ventricular  ?hypertrophy. Left ventricular  ?diastolic parameters are consistent with Grade II diastolic dysfunction  ?(pseudonormalization). The average left ventricular global longitudinal  ?strain is 11.5 %. The global longitudinal strain is abnormal.  ??2. Right ventricular systolic function is normal. The right ventricular  ?size is normal. There is mildly elevated pulmonary artery systolic  ?pressure.  ??3. The mitral valve is normal in structure. Mild to moderate mitral valve  ?regurgitation. Mild mitral stenosis. The mean mitral valve gradient is 4.0  ?mmHg.  ??4. Tricuspid valve regurgitation is mild to moderate.  ??5. The aortic valve is calcified. Aortic valve regurgitation is not  ?visualized. Moderate aortic valve stenosis. Aortic valve area, by VTI  ?measures 1.09 cm?Marland Kitchen Aortic valve mean gradient measures 17.0 mmHg.  Aortic  ?valve Vmax measures 2.67 m/s.  ??6. 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  ??7. The inferior vena cava is normal in size with greater than 50%  ?respiratory variability, suggesting right atrial pressure of 3 mmHg.  ? ?Stress Test 2023 ?  The study is normal. The study is low risk. ??  Left ventricular function is normal. Nuclear stress EF: 70 %. The left ventricular ejection fraction is hyperdynamic (>65%). ??  Prior study available for comparison from 01/21/2019. ? ?  ?Neuro/Psych ?CVA negative psych ROS  ? GI/Hepatic ?Neg liver ROS, GERD  Medicated,  ?Endo/Other  ?diabetes, Type 2, Oral Hypoglycemic Agents ? Renal/GU ?negative Renal ROS  ?negative genitourinary ?  ?Musculoskeletal ? ?(+) Fibromyalgia - ? Abdominal ?  ?Peds ? Hematology ? ?(+) Blood dyscrasia (on plavix), ,   ?Anesthesia Other Findings ? ? Reproductive/Obstetrics ? ?  ? ? ? ? ? ? ? ? ? ? ? ? ? ?  ?  ? ? ? ? ? ? ? ?Anesthesia Physical ?Anesthesia Plan ? ?ASA: 3 and emergent ? ?Anesthesia Plan: General  ? ?Post-op Pain Management:   ? ?Induction: Intravenous and Rapid sequence ? ?PONV Risk Score and Plan: 3 and Ondansetron and Treatment may vary due to age or medical condition ? ?Airway Management Planned: Oral ETT ? ?Additional Equipment:  ? ?Intra-op Plan:  ? ?Post-operative Plan: Extubation in OR ? ?Informed Consent: I have reviewed the patients History and Physical, chart, labs and discussed the procedure including the risks, benefits and alternatives for the proposed anesthesia with the patient or authorized representative who has indicated his/her understanding and acceptance.  ? ? ? ?Dental advisory given ? ?Plan Discussed with: CRNA ? ?Anesthesia Plan Comments:   ? ? ? ? ? ?  Anesthesia Quick Evaluation ? ?

## 2021-03-26 NOTE — Anesthesia Postprocedure Evaluation (Signed)
Anesthesia Post Note ? ?Patient: SHARI NATT ? ?Procedure(s) Performed: CYSTOSCOPY WITH RETROGRADE PYELOGRAM/URETERAL STENT PLACEMENT (Right: Ureter) ? ?  ? ?Patient location during evaluation: PACU ?Anesthesia Type: General ?Level of consciousness: awake and alert ?Pain management: pain level controlled ?Vital Signs Assessment: post-procedure vital signs reviewed and stable ?Respiratory status: spontaneous breathing, nonlabored ventilation, respiratory function stable and patient connected to nasal cannula oxygen ?Cardiovascular status: blood pressure returned to baseline and stable ?Postop Assessment: no apparent nausea or vomiting ?Anesthetic complications: no ? ? ?No notable events documented. ? ?Last Vitals:  ?Vitals:  ? 03/26/21 1530 03/26/21 1557  ?BP: (!) 137/49 (!) 117/43  ?Pulse: (!) 121 (!) 115  ?Resp: 20 20  ?Temp: (!) 38.1 ?C 37.1 ?C  ?SpO2: 99% 97%  ?  ?Last Pain:  ?Vitals:  ? 03/26/21 1557  ?TempSrc: Oral  ?PainSc: 0-No pain  ? ? ?  ?  ?  ?  ?  ?  ? ?Mellissa Conley L Konnor Vondrasek ? ? ? ? ?

## 2021-03-26 NOTE — Consult Note (Signed)
Urology Consult  ? ?Physician requesting consult: Tennis Must, MD ? ?Reason for consult: Obstructing right ureteral stone with early sepsis ? ?History of Present Illness: Gloria Sharp is a 76 y.o. who presented to [facility] on 03/26/2021 with acute onset of right-sided flank pain.  CT A/P 03/26/2021 demonstrated a 3 mm obstructing stone at the right ureterovesical junction with moderate right hydroureteronephrosis and extensive perinephric stranding.  She was also found to have a 9 mm nonobstructing stone in the lower pole of the right kidney. ? ?She was found to be febrile to 101.7.  She does complain of malaise. ? ?She denies prior history of urolithiasis.  She denies prior passage of stones. ? ?Scan also demonstrates large approximately 20 cm fluid collection in her lower abdominal wall.  She has reported history of hernia repair.  She follow-up with the general surgery last week to drain the seroma did not appear to be infected at the time.  She denies abdominal pain related to the site. ? ?Past Medical History:  ?Diagnosis Date  ? Aortic stenosis 09/04/2019  ? CVA (cerebral vascular accident) ([place]) 2015?  ? 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 Diabetes ([place])   ? Dyslipidemia 10/10/2018  ? Essential hypertension 10/10/2018  ? Fibromyalgia   ? GERD (gastroesophageal reflux disease)   ? Heart murmur 10/10/2018  ? History of CVA (cerebrovascular accident) 10/10/2018  ? Hypercholesterolemia   ? Hypertension   ? Leukocytosis   ? Mitral regurgitation 11/24/2018  ? Nonsustained ventricular tachycardia 11/24/2018  ? Obesity   ? Post-operative nausea and vomiting   ? "hard to wake up"  ? Rectocele 01/17/2018  ? Type 2 diabetes mellitus without complication, without long-term current use of insulin ([place]) 10/10/2018  ? ? ?Past Surgical History:  ?Procedure Laterality Date  ? ABDOMINAL HYSTERECTOMY    ? partial, left the ovaries  ? APPENDECTOMY    ? COLONOSCOPY  11/11/2013  ? Colonioc polyps status post polypectomy.  Pancolonic diverticulosis predominately in the sigmoid colon  ? DILATION AND CURETTAGE OF UTERUS    ? GALLBLADDER SURGERY Right 07/2014  ? HERNIA REPAIR    ? Umbilical  ? vaginal polyp removal    ? ? ? ?Current Hospital Medications: ? ?Home meds:  ?No current facility-administered medications on file prior to encounter.  ? ?Current Outpatient Medications on File Prior to Encounter  ?Medication Sig Dispense Refill  ? metoprolol succinate (TOPROL-XL) 100 MG 24 hr tablet TAKE ONE TABLET BY MOUTH EVERY DAY WITH OR IMMEDIATELY FOLLOWING A MEAL 90 tablet 1  ? acetaminophen (TYLENOL) 650 MG CR tablet Take 650 mg by mouth every 8 (eight) hours as needed for pain. 2 tablets BID    ? alendronate (FOSAMAX) 35 MG tablet Take 35 mg by mouth every 7 (seven) days. Take with a full glass of water on an empty stomach.    ? brimonidine (ALPHAGAN) 0.15 % ophthalmic solution Place 1 drop into both eyes 2 (two) times daily.    ? cholestyramine (QUESTRAN) 4 g packet Take 1 packet (4 g total) by mouth daily. Take at least 2 hours before or after rest of the medications 30 each 11  ? clobetasol cream (TEMOVATE) 8.09 % Apply 1 application topically as directed.    ? clopidogrel (PLAVIX) 75 MG tablet Take 75 mg by mouth daily.    ? fenofibrate (TRICOR) 145 MG tablet Take 145 mg by mouth daily.    ? furosemide (LASIX) 20 MG tablet Take 1 tablet (20 mg  total) by mouth daily. 90 tablet 1  ? gabapentin (NEURONTIN) 300 MG capsule Take 300 mg by mouth 2 (two) times daily.    ? lisinopril-hydrochlorothiazide (ZESTORETIC) 20-12.5 MG tablet Take 1 tablet by mouth daily.    ? MAGNESIUM PO Take 500 mg by mouth daily.    ? metFORMIN (GLUCOPHAGE) 1000 MG tablet Take 1,000 mg by mouth 2 (two) times daily with a meal.    ? Omega-3 Fatty Acids (FISH OIL PO) Take 2,000 mg by mouth daily.    ? pantoprazole (PROTONIX) 40 MG tablet Take 40 mg by mouth daily as needed (for reflux).     ? permethrin (ELIMITE) 5 % cream Apply 1 application topically as needed for  itching. For Lichen Chronicus    ? pioglitazone (ACTOS) 15 MG tablet Take 15 mg by mouth daily.    ? pravastatin (PRAVACHOL) 20 MG tablet Take 20 mg by mouth every Sunday.     ? Semaglutide, 1 MG/DOSE, (OZEMPIC, 1 MG/DOSE,) 2 MG/1.5ML SOPN Inject 1 mg into the skin every Wednesday.     ? TURMERIC PO Take 1 tablet by mouth daily.    ? vitamin B-12 (CYANOCOBALAMIN) 1000 MCG tablet Take 1,000 mcg by mouth daily.    ?  ? ?Scheduled Meds: ?Continuous Infusions: ? cefTRIAXone (ROCEPHIN)  IV    ? lactated ringers    ? ?PRN Meds:.acetaminophen **OR** acetaminophen, HYDROmorphone (DILAUDID) injection, prochlorperazine ? ?Allergies:  ?Allergies  ?Allergen Reactions  ? Celecoxib Itching and Swelling  ?  Swelling- whole body and turns red  ? Nabumetone Itching and Swelling  ? Ciprofloxacin Hives, Swelling and Rash  ? Flagyl [Metronidazole] Itching, Swelling and Rash  ? ? ?Family History  ?Problem Relation Age of Onset  ? Cancer Mother   ? Hypertension Mother   ? Stroke Mother   ? Diabetes Mother   ? Heart failure Mother   ? Hypertension Father   ? Heart disease Father   ? Colon cancer Neg Hx   ? Esophageal cancer Neg Hx   ? Stomach cancer Neg Hx   ? Rectal cancer Neg Hx   ? Colon polyps Neg Hx   ? ? ?Social History:  reports that she has never smoked. She has never used smokeless tobacco. She reports that she does not currently use alcohol. She reports that she does not use drugs. ? ?ROS: ?A complete review of systems was performed.  All systems are negative except for pertinent findings as noted. ? ?Physical Exam:  ?Vital signs in last 24 hours: ?Temp:  [101.7 ?F (38.7 ?C)] 101.7 ?F (38.7 ?C) (03/12 1300) ?Pulse Rate:  [123] 123 (03/12 1300) ?Resp:  [18] 18 (03/12 1300) ?BP: (103)/(88) 103/88 (03/12 1300) ?SpO2:  [98 %] 98 % (03/12 1300) ?Constitutional:  Alert and oriented, No acute distress ?Cardiovascular: Regular rate and rhythm ?Respiratory: Normal respiratory effort, Lungs clear bilaterally ?GI: Morbidly obese, large  pannus that is warm around prior well-healed incision.  No erythema.  Nontender.  No CVA tenderness. ?Neurologic: Grossly intact, no focal deficits ?Psychiatric: Normal mood and affect ? ?Laboratory Data:  ?No results for input(s): WBC, HGB, HCT, PLT in the last 72 hours. ? ?No results for input(s): NA, K, CL, GLUCOSE, BUN, CALCIUM, CREATININE in the last 72 hours. ? ?Invalid input(s): CO3 ? ? ?Results for orders placed or performed during the hospital encounter of 03/26/21 (from the past 24 hour(s))  ?Glucose, capillary     Status: Abnormal  ? Collection Time: 03/26/21 12:53 PM  ?Result  Value Ref Range  ? Glucose-Capillary 122 (H) 70 - 99 mg/dL  ? ?No results found for this or any previous visit (from the past 240 hour(s)). ? ?Renal Function: ?No results for input(s): CREATININE in the last 168 hours. ?CrCl cannot be calculated (Patient's most recent lab result is older than the maximum 21 days allowed.). ? ?Radiologic Imaging: ?No results found. ? ?I independently reviewed the above imaging studies. ? ?Impression/Recommendation: ?#1.  Obstructing right ureteral stone with sepsis: CT A/P 03/26/2021 with 3 mm right UVJ stone with hydroureteronephrosis.  Also 9 mm right lower pole stone.  Febrile at 101.7 on presentation. ? ?Recommend cystoscopy, right retrograde pyelogram, right ureteral stent placement. ?Continue broad-spectrum antibiotics ?Follow-up urine culture ?-We will arrange follow-up outpatient for definitive treatment of her stones ? ?-The risks, benefits and alternatives of cystoscopy with right JJ stent placement was discussed with the patient.  Risks include, but are not limited to: bleeding, urinary tract infection, ureteral injury, ureteral stricture disease, chronic pain, urinary symptoms, bladder injury, stent migration, the need for nephrostomy tube placement, MI, CVA, DVT, PE and the inherent risks with general anesthesia.  The patient voices understanding and wishes to proceed.  ? ?Matt R. Delenn Ahn  MD ?03/26/2021, 1:26 PM  ?[facility]  ?Pager: [phone] ? ? ?CC: Tennis Must, MD ? ?

## 2021-03-26 NOTE — H&P (Signed)
History and Physical    PatientMarland Kitchen Gloria Sharp:[id number] DOB: 11-25-1945 DOA: 03/26/2021 DOS: the patient was seen and examined on 03/26/2021 PCP: Maryella Shivers, MD  Patient coming from: Home  Chief Complaint: No chief complaint on file.  HPI: Gloria Sharp is a 76 y.o. female with medical history significant of aortic stenosis, mitral regurgitation, nonsustained V. tach, history of CVA, cystocele with uterine prolapse, type 2 diabetes, hyperlipidemia, hypertension, fibromyalgia, GERD, type 2 diabetes who was transferred from [facility] emergency department after she presented there with right flank pain in the setting of nephrolithiasis with right hydroureteronephrosis and perinephric stranding.  No history of hematuria or dysuria.  There was also concern for a fluid collection on the area of a recent right-sided hernia repair who General surgery reviewed and seems to be a seroma.  No chest pain, dyspnea, dizziness, palpitations, recent lower extremity edema, diarrhea, constipation, melena or hematochezia.  Initial vital signs on arrival to the hospital were temperature 101.7 F, pulse 123, respiration 18, BP 103/88 mmHg O2 sat 98% on room air.  LR 1000 mL bolus, ceftriaxone 2 g IVPB and vancomycin 1 g IVPB ordered.  The patient was subsequently taken to the OR for cystoscopy without further delay.  CT findings of postop hernia fluid collection were discussed with Dr. Rosendo Gros who stated that this was most likely a seroma so vancomycin was discontinued.  Please see transfer packet for [facility]'s results.   Review of Systems: As mentioned in the history of present illness. All other systems reviewed and are negative. Past Medical History:  Diagnosis Date   Aortic stenosis 09/04/2019   CVA (cerebral vascular accident) [facility]) 2015?   Cystocele with uterine prolapse 01/17/2018   Diabetes ([place])    Dyslipidemia 10/10/2018   Essential hypertension 10/10/2018   Fibromyalgia    GERD  (gastroesophageal reflux disease)    Heart murmur 10/10/2018   History of CVA (cerebrovascular accident) 10/10/2018   Hypercholesterolemia    Hypertension    Leukocytosis    Mitral regurgitation 11/24/2018   Nonsustained ventricular tachycardia 11/24/2018   Obesity    Post-operative nausea and vomiting    "hard to wake up"   Rectocele 01/17/2018   Type 2 diabetes mellitus without complication, without long-term current use of insulin ([place]) 10/10/2018   Past Surgical History:  Procedure Laterality Date   ABDOMINAL HYSTERECTOMY     partial, left the ovaries   APPENDECTOMY     COLONOSCOPY  11/11/2013   Colonioc polyps status post polypectomy. Pancolonic diverticulosis predominately in the sigmoid colon   DILATION AND CURETTAGE OF UTERUS     GALLBLADDER SURGERY Right 07/2014   HERNIA REPAIR     Umbilical   vaginal polyp removal     Social History:  reports that she has never smoked. She has never used smokeless tobacco. She reports that she does not currently use alcohol. She reports that she does not use drugs.  Allergies  Allergen Reactions   Celecoxib Itching and Swelling    Swelling- whole body and turns red   Nabumetone Itching and Swelling   Ciprofloxacin Hives, Swelling and Rash   Flagyl [Metronidazole] Itching, Swelling and Rash    Family History  Problem Relation Age of Onset   Cancer Mother    Hypertension Mother    Stroke Mother    Diabetes Mother    Heart failure Mother    Hypertension Father    Heart disease Father    Colon cancer Neg Hx  Esophageal cancer Neg Hx    Stomach cancer Neg Hx    Rectal cancer Neg Hx    Colon polyps Neg Hx     Prior to Admission medications   Medication Sig Start Date End Date Taking? Authorizing Provider  metoprolol succinate (TOPROL-XL) 100 MG 24 hr tablet TAKE ONE TABLET BY MOUTH EVERY DAY WITH OR IMMEDIATELY FOLLOWING A MEAL 02/28/21   Park Liter, MD  acetaminophen (TYLENOL) 650 MG CR tablet Take 650 mg by mouth  every 8 (eight) hours as needed for pain. 2 tablets BID    [provider]  alendronate (FOSAMAX) 35 MG tablet Take 35 mg by mouth every 7 (seven) days. Take with a full glass of water on an empty stomach.    [provider]  brimonidine (ALPHAGAN) 0.15 % ophthalmic solution Place 1 drop into both eyes 2 (two) times daily. 08/10/19   [provider]  cholestyramine (QUESTRAN) 4 g packet Take 1 packet (4 g total) by mouth daily. Take at least 2 hours before or after rest of the medications 03/25/19   Jackquline Denmark, MD  clobetasol cream (TEMOVATE) 5.00 % Apply 1 application topically as directed.    [provider]  clopidogrel (PLAVIX) 75 MG tablet Take 75 mg by mouth daily. 01/16/21   [provider]  fenofibrate (TRICOR) 145 MG tablet Take 145 mg by mouth daily.    [provider]  furosemide (LASIX) 20 MG tablet Take 1 tablet (20 mg total) by mouth daily. 03/11/20 02/13/21  Park Liter, MD  gabapentin (NEURONTIN) 300 MG capsule Take 300 mg by mouth 2 (two) times daily.    [provider]  lisinopril-hydrochlorothiazide (ZESTORETIC) 20-12.5 MG tablet Take 1 tablet by mouth daily.    [provider]  MAGNESIUM PO Take 500 mg by mouth daily.    [provider]  metFORMIN (GLUCOPHAGE) 1000 MG tablet Take 1,000 mg by mouth 2 (two) times daily with a meal.    [provider]  Omega-3 Fatty Acids (FISH OIL PO) Take 2,000 mg by mouth daily.    [provider]  pantoprazole (PROTONIX) 40 MG tablet Take 40 mg by mouth daily as needed (for reflux).     [provider]  permethrin (ELIMITE) 5 % cream Apply 1 application topically as needed for itching. For Lichen Chronicus 9/38/18   [provider]  pioglitazone (ACTOS) 15 MG tablet Take 15 mg by mouth daily. 01/25/20   [provider]  pravastatin (PRAVACHOL) 20 MG tablet Take 20 mg by mouth every Sunday.     [provider]   Semaglutide, 1 MG/DOSE, (OZEMPIC, 1 MG/DOSE,) 2 MG/1.5ML SOPN Inject 1 mg into the skin every Wednesday.     [provider]  TURMERIC PO Take 1 tablet by mouth daily.    [provider]  vitamin B-12 (CYANOCOBALAMIN) 1000 MCG tablet Take 1,000 mcg by mouth daily.    [provider]    Physical Exam: Vitals:   03/26/21 1351 03/26/21 1502 03/26/21 1504 03/26/21 1515  BP: 137/64 (!) 134/50  (!) 121/49  Pulse: (!) 129 (!) 125  (!) 116  Resp: 20 (!) 22 (!) 21 19  Temp: (!) 103 F (39.4 C) (!) 101.5 F (38.6 C)    TempSrc: Oral     SpO2: 98% 99% 98% 97%   Physical Exam Vitals and nursing note reviewed.  Constitutional:      Appearance: Normal appearance. She is ill-appearing.  HENT:  Head: Normocephalic.     Mouth/Throat:     Mouth: Mucous membranes are dry.  Eyes:     General: No scleral icterus.    Pupils: Pupils are equal, round, and reactive to light.  Cardiovascular:     Rate and Rhythm: Regular rhythm. Tachycardia present. Extrasystoles are present.    Heart sounds: S1 normal and S2 normal. Murmur heard.  Decrescendo systolic murmur is present with a grade of 3/6.  Pulmonary:     Effort: Pulmonary effort is normal.     Breath sounds: Normal breath sounds.  Abdominal:     General: Bowel sounds are normal. There is no distension.     Palpations: Abdomen is soft.     Tenderness: There is right CVA tenderness.  Musculoskeletal:     Cervical back: Neck supple.     Right lower leg: No edema.     Left lower leg: No edema.  Skin:    General: Skin is warm and dry.     Coloration: Skin is not jaundiced.  Neurological:     General: No focal deficit present.     Mental Status: She is alert, oriented to person, place, and time and easily aroused.  Psychiatric:        Mood and Affect: Mood normal.        Behavior: Behavior normal. Behavior is cooperative.    Data Reviewed:  There are no new results to review at this time.  Assessment and  Plan: Principal Problem:   Hydronephrosis of right kidney Associated with   Sepsis secondary to UTI ([place]) Admit to telemetry/inpatient. Continue IV fluids. Continue ceftriaxone 2 g IVPB daily. Follow-up blood culture and sensitivity. Follow-up CBC and chemistry in the morning.  Active Problems:   Hypertension Blood pressures have been soft at times. Hold antihypertensives. Monitor blood pressure and heart rate.    Hypercholesterolemia Follow-up with primary care provider.    GERD (gastroesophageal reflux disease) Pantoprazole 40 mg p.o. daily.    Fibromyalgia Analgesics as needed.    Type 2 diabetes mellitus with hyperglycemia (HCC) Carbohydrate modified diet. CBG monitoring with RI SS. Hold Ozempic. Hold metformin due to IV contrast.    Class 3 obesity (HCC) Lifestyle modifications. Follow-up with PCP.    Advance Care Planning:   Code Status: Full Code   Consults: Urology Rexene Alberts, MD)  Family Communication: A portion of the HPI obtained from the husband.  Severity of Illness: The appropriate patient status for this patient is OBSERVATION. Observation status is judged to be reasonable and necessary in order to provide the required intensity of service to ensure the patient's safety. The patient's presenting symptoms, physical exam findings, and initial radiographic and laboratory data in the context of their medical condition is felt to place them at decreased risk for further clinical deterioration. Furthermore, it is anticipated that the patient will be medically stable for discharge from the hospital within 2 midnights of admission.   Author: Reubin Milan, MD 03/26/2021 3:18 PM  For on call review [email or web address].   This document was prepared using [employer] and may contain some unintended transcription errors.

## 2021-03-27 ENCOUNTER — Encounter (HOSPITAL_COMMUNITY): Payer: Self-pay | Admitting: Urology

## 2021-03-27 DIAGNOSIS — R42 Dizziness and giddiness: Secondary | ICD-10-CM

## 2021-03-27 DIAGNOSIS — E876 Hypokalemia: Secondary | ICD-10-CM

## 2021-03-27 DIAGNOSIS — N390 Urinary tract infection, site not specified: Secondary | ICD-10-CM | POA: Diagnosis not present

## 2021-03-27 DIAGNOSIS — A419 Sepsis, unspecified organism: Secondary | ICD-10-CM

## 2021-03-27 DIAGNOSIS — N179 Acute kidney failure, unspecified: Secondary | ICD-10-CM

## 2021-03-27 HISTORY — DX: Dizziness and giddiness: R42

## 2021-03-27 HISTORY — DX: Hypokalemia: E87.6

## 2021-03-27 HISTORY — DX: Acute kidney failure, unspecified: N17.9

## 2021-03-27 LAB — COMPREHENSIVE METABOLIC PANEL
ALT: 14 U/L (ref 0–44)
AST: 22 U/L (ref 15–41)
Albumin: 3 g/dL — ABNORMAL LOW (ref 3.5–5.0)
Alkaline Phosphatase: 30 U/L — ABNORMAL LOW (ref 38–126)
Anion gap: 8 (ref 5–15)
BUN: 28 mg/dL — ABNORMAL HIGH (ref 8–23)
CO2: 25 mmol/L (ref 22–32)
Calcium: 8.2 mg/dL — ABNORMAL LOW (ref 8.9–10.3)
Chloride: 105 mmol/L (ref 98–111)
Creatinine, Ser: 1.52 mg/dL — ABNORMAL HIGH (ref 0.44–1.00)
GFR, Estimated: 36 mL/min — ABNORMAL LOW (ref >60)
Glucose, Bld: 143 mg/dL — ABNORMAL HIGH (ref 70–99)
Potassium: 4.2 mmol/L (ref 3.5–5.1)
Sodium: 138 mmol/L (ref 135–145)
Total Bilirubin: 0.3 mg/dL (ref 0.3–1.2)
Total Protein: 5.5 g/dL — ABNORMAL LOW (ref 6.5–8.1)

## 2021-03-27 LAB — URINE CULTURE: Culture: <10000 — AB

## 2021-03-27 LAB — CBC
HCT: 30.6 % — ABNORMAL LOW (ref 36.0–46.0)
Hemoglobin: 10 g/dL — ABNORMAL LOW (ref 12.0–15.0)
MCH: 29.5 pg (ref 26.0–34.0)
MCHC: 32.7 g/dL (ref 30.0–36.0)
MCV: 90.3 fL (ref 80.0–100.0)
Platelets: 348 10*3/uL (ref 150–400)
RBC: 3.39 MIL/uL — ABNORMAL LOW (ref 3.87–5.11)
RDW: 14.3 % (ref 11.5–15.5)
WBC: 30.3 10*3/uL — ABNORMAL HIGH (ref 4.0–10.5)
nRBC: 0 % (ref 0.0–0.2)

## 2021-03-27 LAB — GLUCOSE, CAPILLARY
Glucose-Capillary: 121 mg/dL — ABNORMAL HIGH (ref 70–99)
Glucose-Capillary: 188 mg/dL — ABNORMAL HIGH (ref 70–99)
Glucose-Capillary: 118 mg/dL — ABNORMAL HIGH (ref 70–99)
Glucose-Capillary: 152 mg/dL — ABNORMAL HIGH (ref 70–99)

## 2021-03-27 LAB — HEMOGLOBIN A1C
Hgb A1c MFr Bld: 6.6 % — ABNORMAL HIGH (ref 4.8–5.6)
Mean Plasma Glucose: 143 mg/dL

## 2021-03-27 MED ORDER — METOPROLOL SUCCINATE ER 100 MG PO TB24
100.0000 mg | ORAL_TABLET | Freq: Every day | ORAL | Status: DC
Start: 2021-03-28 — End: 2021-03-29
  Administered 2021-03-28: 100 mg via ORAL
  Filled 2021-03-27: qty 1

## 2021-03-27 MED ORDER — FENOFIBRATE 54 MG PO TABS
54.0000 mg | ORAL_TABLET | Freq: Every day | ORAL | Status: DC
  Administered 2021-03-27 – 2021-03-29 (×3): 54 mg via ORAL
  Filled 2021-03-27 (×3): qty 1

## 2021-03-27 MED ORDER — CLOPIDOGREL BISULFATE 75 MG PO TABS
75.0000 mg | ORAL_TABLET | Freq: Every day | ORAL | Status: DC
  Administered 2021-03-27 – 2021-03-29 (×3): 75 mg via ORAL
  Filled 2021-03-27 (×3): qty 1

## 2021-03-27 MED ORDER — CHLORHEXIDINE GLUCONATE CLOTH 2 % EX PADS
6.0000 | MEDICATED_PAD | Freq: Every day | CUTANEOUS | Status: DC
  Administered 2021-03-27 – 2021-03-28 (×2): 6 via TOPICAL

## 2021-03-27 MED ORDER — LACTATED RINGERS IV BOLUS
500.0000 mL | Freq: Once | INTRAVENOUS | Status: AC
  Administered 2021-03-27: 500 mL via INTRAVENOUS

## 2021-03-27 MED ORDER — METOPROLOL SUCCINATE ER 100 MG PO TB24
100.0000 mg | ORAL_TABLET | Freq: Every day | ORAL | Status: DC
  Administered 2021-03-27: 100 mg via ORAL
  Filled 2021-03-27: qty 1

## 2021-03-27 MED ORDER — LACTATED RINGERS IV SOLN: INTRAVENOUS | Status: DC

## 2021-03-27 MED ORDER — PANTOPRAZOLE SODIUM 40 MG PO TBEC
40.0000 mg | DELAYED_RELEASE_TABLET | Freq: Every day | ORAL | Status: DC
  Administered 2021-03-27 – 2021-03-29 (×3): 40 mg via ORAL
  Filled 2021-03-27 (×3): qty 1

## 2021-03-27 NOTE — Assessment & Plan Note (Signed)
Patient underwent cystoscopy, right retrograde pyelogram with interpretation, right ureteral stent placement by Dr. Abner Greenspan on 03/26/2021 ?

## 2021-03-27 NOTE — Assessment & Plan Note (Signed)
Resume Tricor. ?She takes statin weekly ?

## 2021-03-27 NOTE — Assessment & Plan Note (Signed)
In the setting of obstructing right ureteral stone and sepsis.  Continue with IV fluids.  Monitor urine out put  Prior Cr 1.1--1.2  Cr increase to 1.5

## 2021-03-27 NOTE — Progress Notes (Signed)
Mobility Specialist - Progress Note ? ? ? 03/27/21 1403  ?Mobility  ?Activity Ambulated with assistance in hallway  ?Level of Assistance Contact guard assist, steadying assist  ?Assistive Device Front wheel walker  ?Distance Ambulated (ft) 240 ft  ?Activity Response Tolerated well  ?$Mobility charge 1 Mobility  ? ?Pt agreeable to mobilize this afternoon. Used RW to ambulate 240 ft in hallway. Experienced SOB on return trip and requested to return to room shortly after. Pt returned to recliner and c/o of pain in R inner thigh. Left pt sitting up with call bell at side and family in room. RN informed of session and pt's pain.  ? ?Gloria Sharp S. ?Mobility Specialist ?Acute Rehabilitation Services ?Phone: [phone] ?03/27/21, 2:05 PM ? ? ? ? ?

## 2021-03-27 NOTE — Progress Notes (Signed)
1 Day Post-Op ?Subjective: ?Denies pain, no nausea or emesis. Tolerating diet. Tolerating foley. Fever curve downtrending, now afebrile. ? ?Objective: ?Vital signs in last 24 hours: ?Temp:  [97.6 ?F (36.4 ?C)-103 ?F (39.4 ?C)] 97.8 ?F (36.6 ?C) (03/13 0557) ?Pulse Rate:  [69-129] 74 (03/13 0557) ?Resp:  [18-23] 20 (03/13 0557) ?BP: (102-137)/(43-88) 102/48 (03/13 0557) ?SpO2:  [97 %-99 %] 99 % (03/13 0557) ?Weight:  [101 kg] 101 kg (03/12 2128) ? ?Intake/Output from previous day: ?03/12 0701 - 03/13 0700 ?In: 3119.2 [P.O.:150; I.V.:1663.2; IV Piggyback:1306] ?Out: 275 [Urine:275] ?Intake/Output this shift: ?No intake/output data recorded. ?UOP: Only 253m recorded ? ?Physical Exam:  ?General: Alert and oriented ?CV: RRR ?Lungs: Clear ?Abdomen: Soft, ND, NT ?Ext: NT, No erythema ? ?Lab Results: ?Recent Labs  ?  03/26/21 ?1665903/13/23 ?0258  ?HGB 10.7* 10.0*  ?HCT 33.0* 30.6*  ? ?BMET ?Recent Labs  ?  03/26/21 ?1659 03/27/21 ?0258  ?NA 135 138  ?K 3.2* 4.2  ?CL 106 105  ?CO2 23 25  ?GLUCOSE 163* 143*  ?BUN 25* 28*  ?CREATININE 1.20* 1.52*  ?CALCIUM 8.2* 8.2*  ? ? ? ?Studies/Results: ?DG C-Arm 1-60 Min-No Report ? ?Result Date: 03/26/2021 ?Fluoroscopy was utilized by the requesting physician.  No radiographic interpretation.   ? ?Assessment/Plan: ?#1.  Obstructing right ureteral stone with sepsis: CT A/P 03/26/2021 with 3 mm right UVJ stone with hydroureteronephrosis.  Also 9 mm right lower pole stone.  Febrile at 101.7 on presentation. S/p R stent 03/26/2021. ? ?-Pain control prn ?-Diet as tolerated ?-F/u urine culture ?-Continue abx ?-Messaged schedulers to arrange for outpatient followup ?-Keep foley today, likely void trial tomorrow ?  ? LOS: 1 day  ? ?Matt R. Dejha King MD ?03/27/2021, 8:10 AM ?[facility]  ?Pager: [phone] ? ? ? ?

## 2021-03-27 NOTE — Assessment & Plan Note (Signed)
Resume gabapentin when renal function improves

## 2021-03-27 NOTE — Assessment & Plan Note (Signed)
Replaced.

## 2021-03-27 NOTE — Assessment & Plan Note (Addendum)
Patient presents with fever, leukocytosis, tachycardia in the setting of urinary tract infection and obstructing stone.  -Continue with IV fluids and IV antibiotic -Follow urine culture and blood cultures -She underwent cystoscopy and ureteral stent placement. WBC increase, continue with antibiotics, repeat labs in am.

## 2021-03-27 NOTE — Hospital Course (Signed)
76 year old past medical history significant for aortic stenosis, mitral regurgitation, nonsustained V. tach, history of CVA, cystocele with uterine prolapse, type 2 diabetes, hyperlipidemia, hypertension, fibromyalgia, GERD, diabetes type 2 who was transferred from [facility] ED after she presented there with right flank pain in the setting of nephrolithiasis with right hydroureteronephrosis and perinephric stranding. ? ?There was also concern for a fluid collection on the area of the recent right side hernia repair with general surgery review and seems to be a seroma. ? ?Admitted with sepsis, fever, tachycardia, leukocytosis, source of infection urinary tract infection in the setting of nephrolithiasis.  Patient was taken to the OR by urology for cystoscopy and ureteral stent placement. ? ?Dr. Rosendo Gros with surgery review CT finding, these are considered to be a seroma. ?

## 2021-03-27 NOTE — Assessment & Plan Note (Addendum)
She will need lifestyle modifications. ?

## 2021-03-27 NOTE — Progress Notes (Signed)
?Progress Note ? ? ?PatientCOLE Sharp NWG:[id number] DOB: [birth year] DOA: 03/26/2021     1 ?DOS: the patient was seen and examined on 03/27/2021 ?  ?Brief hospital course: ?76 year old past medical history significant for aortic stenosis, mitral regurgitation, nonsustained V. tach, history of CVA, cystocele with uterine prolapse, type 2 diabetes, hyperlipidemia, hypertension, fibromyalgia, GERD, diabetes type 2 who was transferred from [facility] ED after she presented there with right flank pain in the setting of nephrolithiasis with right hydroureteronephrosis and perinephric stranding. ? ?There was also concern for a fluid collection on the area of the recent right side hernia repair with general surgery review and seems to be a seroma. ? ?Admitted with sepsis, fever, tachycardia, leukocytosis, source of infection urinary tract infection in the setting of nephrolithiasis.  Patient was taken to the OR by urology for cystoscopy and ureteral stent placement. ? ?Dr. Rosendo Sharp with surgery review CT finding, these are considered to be a seroma. ? ?Assessment and Plan: ?* Sepsis secondary to UTI [facility]) ?Patient presents with fever, leukocytosis, tachycardia in the setting of urinary tract infection and obstructing stone.  ?-Continue with IV fluids and IV antibiotic ?-Follow urine culture and blood cultures ?-She underwent cystoscopy and ureteral stent placement. ?WBC increase, continue with antibiotics, repeat labs in am.  ? ?Dizziness ?She reported lightheaded, today. EKG sinus rhythm.  ?Vital  stable, cbg normal. Improved with fluids.  ?Monitor.  ? ?AKI (acute kidney injury) ([place]) ?In the setting of obstructing right ureteral stone and sepsis.  ?Continue with IV fluids.  ?Monitor urine out put  ?Prior Cr 1.1--1.2  ?Cr increase to 1.5  ? ?Hypokalemia ?Replaced.  ? ?Type 2 diabetes mellitus with hyperglycemia ([place]) ?Continue with a sliding scale insulin.  Hold oral hypoglycemic agents ? ?Class 3 obesity  (HCC) ?She will need lifestyle modifications. ? ?Hydronephrosis of right kidney ?Patient underwent cystoscopy, right retrograde pyelogram with interpretation, right ureteral stent placement by Dr. Abner Sharp on 03/26/2021 ? ?Fibromyalgia ?Resume gabapentin when renal function improves ? ?GERD (gastroesophageal reflux disease) ?Continue with Protonix ? ?Hypercholesterolemia ?Resume Tricor. ?She takes statin weekly ? ?Hypertension ?Resume metoprolol.  Continue to hold hydrochlorothiazide and lisinopril due to AKI. ? ? ? ? ?  ? ?Subjective: he is alert, started to have feeling lightheaded, passing out while I arrives to her room. Vitals, cbg check stable. Her symptoms resolved subsequently. She denies chest pain or dyspnea.  ? ?I came and check on her later in the afternoon, and she was feeling better, no more dizziness. She was abel to ambulate in the hall.  ? ?Physical Exam: ?Vitals:  ? 03/26/21 2128 03/27/21 0209 03/27/21 0557 03/27/21 1257  ?BP: (!) 117/46 (!) 104/47 (!) 102/48 (!) 126/53  ?Pulse: 79 69 74 77  ?Resp: '18  20 17  '$ ?Temp: 97.7 ?F (36.5 ?C) 97.6 ?F (36.4 ?C) 97.8 ?F (36.6 ?C) 98 ?F (36.7 ?C)  ?TempSrc: Oral Oral Oral Oral  ?SpO2: 97% 98% 99% 100%  ?Weight: 101 kg     ?Height: 5' (1.524 m)     ? ?General; NAD ?Lungs; CTA ?CVS; S 1, S 2 RRR systolic murmur.  ?Abdomen; soft, nt ? ?Data Reviewed: ? ?CBC, bmet , EKG reviewed.  ? ?Family Communication: Husband at bedside.  ? ?Disposition: ?Status is: Inpatient ?Remains inpatient appropriate because: for IV fluids, antibiotics, treating for sepsis.  ? Planned Discharge Destination: Home ? ? ? ?Time spent: 45 minutes ? ?Author: ?Gloria Shiley, MD ?03/27/2021 3:35 PM ? ?For on call review [email or web address].  ?

## 2021-03-27 NOTE — Assessment & Plan Note (Signed)
Resume metoprolol.  Continue to hold hydrochlorothiazide and lisinopril due to AKI. ?

## 2021-03-27 NOTE — Assessment & Plan Note (Signed)
Continue with a sliding scale insulin.  Hold oral hypoglycemic agents ?

## 2021-03-27 NOTE — Assessment & Plan Note (Signed)
She reported lightheaded, today. EKG sinus rhythm.  Vital  stable, cbg normal. Improved with fluids.  Monitor.

## 2021-03-27 NOTE — Assessment & Plan Note (Signed)
-

## 2021-03-28 LAB — CBC
HCT: 29.7 % — ABNORMAL LOW (ref 36.0–46.0)
Hemoglobin: 9.7 g/dL — ABNORMAL LOW (ref 12.0–15.0)
MCH: 29.4 pg (ref 26.0–34.0)
MCHC: 32.7 g/dL (ref 30.0–36.0)
MCV: 90 fL (ref 80.0–100.0)
Platelets: 334 10*3/uL (ref 150–400)
RBC: 3.3 MIL/uL — ABNORMAL LOW (ref 3.87–5.11)
RDW: 14.4 % (ref 11.5–15.5)
WBC: 18.7 10*3/uL — ABNORMAL HIGH (ref 4.0–10.5)
nRBC: 0 % (ref 0.0–0.2)

## 2021-03-28 LAB — BASIC METABOLIC PANEL
Anion gap: 7 (ref 5–15)
BUN: 27 mg/dL — ABNORMAL HIGH (ref 8–23)
CO2: 26 mmol/L (ref 22–32)
Calcium: 8.2 mg/dL — ABNORMAL LOW (ref 8.9–10.3)
Chloride: 104 mmol/L (ref 98–111)
Creatinine, Ser: 1.25 mg/dL — ABNORMAL HIGH (ref 0.44–1.00)
GFR, Estimated: 46 mL/min — ABNORMAL LOW (ref >60)
Glucose, Bld: 118 mg/dL — ABNORMAL HIGH (ref 70–99)
Potassium: 3.9 mmol/L (ref 3.5–5.1)
Sodium: 137 mmol/L (ref 135–145)

## 2021-03-28 LAB — GLUCOSE, CAPILLARY
Glucose-Capillary: 99 mg/dL (ref 70–99)
Glucose-Capillary: 135 mg/dL — ABNORMAL HIGH (ref 70–99)
Glucose-Capillary: 130 mg/dL — ABNORMAL HIGH (ref 70–99)
Glucose-Capillary: 118 mg/dL — ABNORMAL HIGH (ref 70–99)

## 2021-03-28 MED ORDER — FLUCONAZOLE 100 MG PO TABS
100.0000 mg | ORAL_TABLET | Freq: Once | ORAL | Status: AC
  Administered 2021-03-28: 100 mg via ORAL
  Filled 2021-03-28: qty 1

## 2021-03-28 MED ORDER — HYDROMORPHONE HCL 1 MG/ML IJ SOLN
0.5000 mg | Freq: Once | INTRAMUSCULAR | Status: AC | PRN
  Administered 2021-03-28: 0.5 mg via INTRAVENOUS
  Filled 2021-03-28: qty 0.5

## 2021-03-28 NOTE — Progress Notes (Signed)
?Progress Note ? ? ?PatientLUDIA Sharp Sharp:[id number] DOB: [birth year] DOA: 03/26/2021     2 ?DOS: the patient was seen and examined on 03/28/2021 ?  ?Brief hospital course: ?76 year old past medical history significant for aortic stenosis, mitral regurgitation, nonsustained V. tach, history of CVA, cystocele with uterine prolapse, type 2 diabetes, hyperlipidemia, hypertension, fibromyalgia, GERD, diabetes type 2 who was transferred from [facility] ED after she presented there with right flank pain in the setting of nephrolithiasis with right hydroureteronephrosis and perinephric stranding. ? ?There was also concern for a fluid collection on the area of the recent right side hernia repair with general surgery review and seems to be a seroma. ? ?Admitted with sepsis, fever, tachycardia, leukocytosis, source of infection urinary tract infection in the setting of nephrolithiasis.  Patient was taken to the OR by urology for cystoscopy and ureteral stent placement. ? ?Dr. Rosendo Gros with surgery review CT finding, these are considered to be a seroma. ? ?Assessment and Plan: ?* Sepsis secondary to UTI [facility]) ?Patient presents with fever, leukocytosis, tachycardia in the setting of urinary tract infection and obstructing stone.  ?-Treated  with IV fluids and IV antibiotic ?-Follow urine culture: 10,000 colonies  Insignificant growth and blood cultures: No growth to date.   ?-She underwent cystoscopy and ureteral stent placement. ?-WBC trending down 30---18 plan to continue with IV antibiotics for another 24 hours.  ? ?Dizziness ?She reported lightheaded on 3/13.EKG sinus rhythm.  ?Vital  stable, cbg normal. Improved with fluids.  ?Monitor.  ? ?AKI (acute kidney injury) ([place]) ?In the setting of obstructing right ureteral stone and sepsis.  ?Treated  with IV fluids.  ?Monitor urine out put  ?Prior Cr 1.1--1.2  ?Cr down to 1.2. stop IV fluids to avoid overload due to history of aortic stenosis.,  ? ?Hypokalemia ?Resolved.   ? ?Type 2 diabetes mellitus with hyperglycemia ([place]) ?Continue with a sliding scale insulin.  Hold oral hypoglycemic agents ? ?Class 3 obesity (HCC) ?She will need lifestyle modifications. ? ?Hydronephrosis of right kidney ?Patient underwent cystoscopy, right retrograde pyelogram with interpretation, right ureteral stent placement by Dr. Abner Greenspan on 03/26/2021 ? ?Fibromyalgia ?Resume gabapentin if renal function remain stable at discharge.  ? ?GERD (gastroesophageal reflux disease) ?Continue with Protonix ? ?Hypercholesterolemia ?Resume Tricor. ?She takes statin weekly ? ?Hypertension ?Resume metoprolol.  Continue to hold hydrochlorothiazide and lisinopril due to AKI. ? ? ? ? ?  ? ?Subjective: She is not feeling well today, doesn't feel ready to go home. No specific complaints.  ?She report some swelling right arm. Plan to keep arm elevated.  ? ?Physical Exam: ?Vitals:  ? 03/27/21 1541 03/27/21 2131 03/27/21 2134 03/28/21 0634  ?BP:  (!) 147/63  (!) 153/69  ?Pulse:  83  77  ?Resp:  (!) '30 19 18  '$ ?Temp: 98.3 ?F (36.8 ?C) 98.5 ?F (36.9 ?C)  98 ?F (36.7 ?C)  ?TempSrc: Oral Oral  Oral  ?SpO2:  96%  98%  ?Weight:      ?Height:      ? ?General; NAD.  ?Lung; CTA ?'CVS; S 1, S 2 RRR Systolic murmur.  ? ?Data Reviewed: ? ?Cbc and Bmet reviewed.  ? ?Family Communication: Husband at bedside 3/13. ? ?Disposition: ?Status is: Inpatient ?Remains inpatient appropriate because: still with leukocytosis continue IV antibiotics for another 24 hours. Home 3/15 if stable.  ? Planned Discharge Destination: Home ? ? ? ?Time spent: 45 minutes ? ?Author: ?Elmarie Shiley, MD ?03/28/2021 3:35 PM ? ?For on call review [email or web address].  ?

## 2021-03-28 NOTE — Plan of Care (Signed)
  Problem: Coping: Goal: Level of anxiety will decrease Outcome: Progressing   Problem: Safety: Goal: Ability to remain free from injury will improve Outcome: Progressing   Problem: Skin Integrity: Goal: Risk for impaired skin integrity will decrease Outcome: Progressing   

## 2021-03-28 NOTE — Evaluation (Signed)
Occupational Therapy Evaluation ?Patient Details ?Name: Gloria Sharp ?MRN: [id number] ?DOB: 06-Oct-1945 ?Today's Date: 03/28/2021 ? ? ?History of Present Illness patient is a 76 year old female who presented to the hosptial with R flank pain. patient was found to have sepsis, tachycardia, UTI in setting of nephrolithiasis. cystocopy and ureteral stent placement 3/12. PMH aortic stenosis, mitral regurgitation, nonsustained V. tach, history of CVA, cystocele with uterine prolapse, type 2 diabetes, hyperlipidemia, hypertension, fibromyalgia, GERD, diabetes type 2  ? ?Clinical Impression ?  ?Patient is a 76 year old female who was noted to have had a functional decline. Patient was noted to have increased pain, decreased functional activity tolerance, decreased endurance, decreased knowledge of AE impacting participation in ADLs. Patient would continue to benefit from skilled OT services at this time while admitted and after d/c to address noted deficits in order to improve overall safety and independence in ADLs.  ? ?   ? ?Recommendations for follow up therapy are one component of a multi-disciplinary discharge planning process, led by the attending physician.  Recommendations may be updated based on patient status, additional functional criteria and insurance authorization.  ? ?Follow Up Recommendations ? No OT follow up  ?  ?Assistance Recommended at Discharge Frequent or constant Supervision/Assistance  ?Patient can return home with the following A little help with walking and/or transfers;Assistance with cooking/housework;Direct supervision/assist for financial management;Help with stairs or ramp for entrance;Direct supervision/assist for medications management;Assist for transportation ? ?  ?Functional Status Assessment ? Patient has had a recent decline in their functional status and demonstrates the ability to make significant improvements in function in a reasonable and predictable amount of time.  ?Equipment  Recommendations ? Other (comment) (total hip kit)  ?  ?Recommendations for Other Services   ? ? ?  ?Precautions / Restrictions Precautions ?Precautions: Fall ?Restrictions ?Weight Bearing Restrictions: No  ? ?  ? ?Mobility Bed Mobility ?  ?  ?  ?  ?  ?  ?  ?General bed mobility comments: patient was seated in recliner and returned to same ?  ? ?Transfers ?  ?  ?  ?  ?  ?  ?  ?  ?  ?  ?  ? ?  ?Balance Overall balance assessment: Mild deficits observed, not formally tested ?  ?  ?  ?  ?  ?  ?  ?  ?  ?  ?  ?  ?  ?  ?  ?  ?  ?  ?   ? ?ADL either performed or assessed with clinical judgement  ? ?ADL Overall ADL's : Needs assistance/impaired ?Eating/Feeding: Set up;Sitting ?  ?Grooming: Wash/dry face;Sitting;Set up ?  ?Upper Body Bathing: Set up;Sitting ?  ?Lower Body Bathing: Minimal assistance;Sit to/from stand;Sitting/lateral leans ?Lower Body Bathing Details (indicate cue type and reason): pain impacting bringing BLE up to lap ?Upper Body Dressing : Set up;Sitting ?  ?Lower Body Dressing: Minimal assistance;Sit to/from stand;Sitting/lateral leans;With adaptive equipment ?Lower Body Dressing Details (indicate cue type and reason): patient was educated on total hip kit with patient able to demonstrate ability to use AE to complete ADL tasks. patient reported decreased pain in stomach with this method. patient, patietns daughter and husband who were present were educated on where to purchase items.[employer] ?Toilet Transfer: Min guard;Ambulation;Regular Toilet ?Toilet Transfer Details (indicate cue type and reason): with cues to keep RW close ?Toileting- Water quality scientist and Hygiene: Min guard;Sit to/from stand ?  ?  ?  ?Functional mobility during ADLs: Min  guard ?General ADL Comments: cues to keep RW close. able to leave room and reach nurse stand and back with increased time and reported increased pain in legs  ? ? ? ?Vision Patient Visual Report: No change from baseline ?   ?   ?Perception   ?  ?Praxis   ?  ? ?Pertinent  Vitals/Pain Pain Assessment ?Pain Assessment: Faces ?Faces Pain Scale: Hurts little more ?Pain Location: bilateral knees and top of R foot ?Pain Descriptors / Indicators: Discomfort, Grimacing ?Pain Intervention(s): Monitored during session, Limited activity within patient's tolerance  ? ? ? ?Hand Dominance Right ?  ?Extremity/Trunk Assessment Upper Extremity Assessment ?Upper Extremity Assessment: Overall WFL for tasks assessed;RUE deficits/detail ?RUE Deficits / Details: reported history of fracture in humerus. able to range BUE to 90 degreed flexion/abduction. ?  ?Lower Extremity Assessment ?Lower Extremity Assessment: Defer to PT evaluation ?  ?Cervical / Trunk Assessment ?Cervical / Trunk Assessment: Normal ?  ?Communication Communication ?Communication: No difficulties ?  ?Cognition Arousal/Alertness: Awake/alert ?Behavior During Therapy: [facility] for tasks assessed/performed ?Overall Cognitive Status: Within Functional Limits for tasks assessed ?  ?  ?  ?  ?  ?  ?  ?  ?  ?  ?  ?  ?  ?  ?  ?  ?General Comments: patient was motivated to participate in session. ?  ?  ?General Comments    ? ?  ?Exercises   ?  ?Shoulder Instructions    ? ? ?Home Living Family/patient expects to be discharged to:: Private residence ?Living Arrangements: Spouse/significant other ?Available Help at Discharge: Family;Available PRN/intermittently ?Type of Home: House ?Home Access: Ramped entrance ?  ?  ?Home Layout: One level ?  ?  ?  ?  ?  ?  ?  ?Home Equipment: Conservation officer, nature (2 wheels) ?  ?  ?  ? ?  ?Prior Functioning/Environment Prior Level of Function : Independent/Modified Independent ?  ?  ?  ?  ?  ?  ?  ?ADLs Comments: loves to sew and go camping ?  ? ?  ?  ?OT Problem List:   ?  ?   ?OT Treatment/Interventions: Self-care/ADL training;Therapeutic exercise;Neuromuscular education;DME and/or AE instruction;Energy conservation;Therapeutic activities;Balance training;Patient/family education  ?  ?OT Goals(Current goals can be found in  the care plan section) Acute Rehab OT Goals ?Patient Stated Goal: to go home ?OT Goal Formulation: With patient ?Time For Goal Achievement: 04/11/21 ?Potential to Achieve Goals: Good  ?OT Frequency: Min 2X/week ?  ? ?Co-evaluation   ?  ?  ?  ?  ? ?  ?AM-PAC OT "6 Clicks" Daily Activity     ?Outcome Measure Help from another person eating meals?: None ?Help from another person taking care of personal grooming?: None ?Help from another person toileting, which includes using toliet, bedpan, or urinal?: A Little ?Help from another person bathing (including washing, rinsing, drying)?: A Little ?Help from another person to put on and taking off regular upper body clothing?: A Little ?Help from another person to put on and taking off regular lower body clothing?: A Little ?6 Click Score: 20 ?  ?End of Session Equipment Utilized During Treatment: Rolling walker (2 wheels) ?Nurse Communication: Other (comment) (cleared patient to participate) ? ?Activity Tolerance: Patient tolerated treatment well ?Patient left: in chair;with call bell/phone within reach;with family/visitor present ? ?OT Visit Diagnosis: Unsteadiness on feet (R26.81);Muscle weakness (generalized) (M62.81)  ?              ?Time: 4599-7741 ?OT Time Calculation (  min): 26 min ?Charges:  OT General Charges ?$OT Visit: 1 Visit ?OT Evaluation ?$OT Eval Low Complexity: 1 Low ?OT Treatments ?$Self Care/Home Management : 8-22 mins ? ?Kambry Takacs OTR/L, MS ?Acute Rehabilitation Department ?Office# [phone] ?Pager# [phone] ? ? ?[place] ?03/28/2021, 4:59 PM ?

## 2021-03-29 LAB — CBC WITH DIFFERENTIAL/PLATELET
Abs Immature Granulocytes: 0.11 10*3/uL — ABNORMAL HIGH (ref 0.00–0.07)
Basophils Absolute: 0.1 10*3/uL (ref 0.0–0.1)
Basophils Relative: 0 %
Eosinophils Absolute: 0.3 10*3/uL (ref 0.0–0.5)
Eosinophils Relative: 2 %
HCT: 34.4 % — ABNORMAL LOW (ref 36.0–46.0)
Hemoglobin: 11.2 g/dL — ABNORMAL LOW (ref 12.0–15.0)
Immature Granulocytes: 1 %
Lymphocytes Relative: 8 %
Lymphs Abs: 1.2 10*3/uL (ref 0.7–4.0)
MCH: 28.9 pg (ref 26.0–34.0)
MCHC: 32.6 g/dL (ref 30.0–36.0)
MCV: 88.7 fL (ref 80.0–100.0)
Monocytes Absolute: 1.1 10*3/uL — ABNORMAL HIGH (ref 0.1–1.0)
Monocytes Relative: 8 %
Neutro Abs: 11.1 10*3/uL — ABNORMAL HIGH (ref 1.7–7.7)
Neutrophils Relative %: 81 %
Platelets: 412 10*3/uL — ABNORMAL HIGH (ref 150–400)
RBC: 3.88 MIL/uL (ref 3.87–5.11)
RDW: 13.9 % (ref 11.5–15.5)
WBC: 13.9 10*3/uL — ABNORMAL HIGH (ref 4.0–10.5)
nRBC: 0 % (ref 0.0–0.2)

## 2021-03-29 LAB — BASIC METABOLIC PANEL
Anion gap: 9 (ref 5–15)
BUN: 14 mg/dL (ref 8–23)
CO2: 27 mmol/L (ref 22–32)
Calcium: 9.2 mg/dL (ref 8.9–10.3)
Chloride: 103 mmol/L (ref 98–111)
Creatinine, Ser: 0.91 mg/dL (ref 0.44–1.00)
GFR, Estimated: >60 mL/min (ref >60)
Glucose, Bld: 145 mg/dL — ABNORMAL HIGH (ref 70–99)
Potassium: 3.9 mmol/L (ref 3.5–5.1)
Sodium: 139 mmol/L (ref 135–145)

## 2021-03-29 LAB — GLUCOSE, CAPILLARY
Glucose-Capillary: 137 mg/dL — ABNORMAL HIGH (ref 70–99)
Glucose-Capillary: 125 mg/dL — ABNORMAL HIGH (ref 70–99)

## 2021-03-29 LAB — MAGNESIUM: Magnesium: 1.2 mg/dL — ABNORMAL LOW (ref 1.7–2.4)

## 2021-03-29 MED ORDER — LISINOPRIL 10 MG PO TABS
10.0000 mg | ORAL_TABLET | Freq: Every day | ORAL | Status: DC
  Administered 2021-03-29: 10 mg via ORAL
  Filled 2021-03-29: qty 1

## 2021-03-29 MED ORDER — SACCHAROMYCES BOULARDII 250 MG PO CAPS: 250.0000 mg | ORAL_CAPSULE | Freq: Two times a day (BID) | ORAL | 0 refills | Status: AC

## 2021-03-29 MED ORDER — OXYCODONE HCL 5 MG PO TABS: 5.0000 mg | ORAL_TABLET | Freq: Four times a day (QID) | ORAL | 0 refills | Status: DC | PRN

## 2021-03-29 MED ORDER — LISINOPRIL 20 MG PO TABS: 20.0000 mg | ORAL_TABLET | Freq: Every day | ORAL | 0 refills | Status: DC

## 2021-03-29 MED ORDER — CEPHALEXIN 500 MG PO CAPS
500.0000 mg | ORAL_CAPSULE | Freq: Three times a day (TID) | ORAL | 0 refills | Status: AC
Start: 2021-03-30 — End: 2021-04-09

## 2021-03-29 MED ORDER — MAGNESIUM SULFATE 2 GM/50ML IV SOLN
2.0000 g | Freq: Once | INTRAVENOUS | Status: AC
  Administered 2021-03-29: 2 g via INTRAVENOUS
  Filled 2021-03-29: qty 50

## 2021-03-29 NOTE — Plan of Care (Signed)
  Problem: Clinical Measurements: Goal: Ability to maintain clinical measurements within normal limits will improve Outcome: Progressing Goal: Diagnostic test results will improve Outcome: Progressing   Problem: Pain Managment: Goal: General experience of comfort will improve Outcome: Progressing   

## 2021-03-29 NOTE — Discharge Summary (Signed)
?Discharge Summary ? ?Gloria Sharp:[id number] DOB: [birth year] ? ?PCP: Maryella Shivers, MD ? ?Admit date: 03/26/2021 ?Discharge date: 03/29/2021 ? ?Time spent: 55mns, more than 50% time spent on coordination of care.  ? ?Case discussed with urology Dr. GAbner Greenspanprior to discharge ? ?Recommendations for Outpatient Follow-up:  ?F/u with PCP within a week  for hospital discharge follow up, repeat cbc/bmp at follow up ?F/u with urology Dr. GAbner Greenspan?Follow-up with general surgery Dr. MLilia Pro? ?New medication at discharge: ?Keflex ?Oxycodone ? ?Changed medication: ?Discontinue lisinopril/ HCTZ ?Change to lisinopril only ? ?Discharge Diagnoses:  ?Active Hospital Problems  ? Diagnosis Date Noted  ? Sepsis secondary to UTI ([facility] 03/26/2021  ? Hypokalemia 03/27/2021  ? AKI (acute kidney injury) ([facility] 03/27/2021  ? Dizziness 03/27/2021  ? Hydronephrosis of right kidney 03/26/2021  ? Class 3 obesity ([facility] 03/26/2021  ? Type 2 diabetes mellitus with hyperglycemia ([place] 03/26/2021  ? Hypercholesterolemia   ? GERD (gastroesophageal reflux disease)   ? Hypertension   ? Fibromyalgia   ?  ?Resolved Hospital Problems  ?No resolved problems to display.  ? ? ?Discharge Condition: stable ? ?Diet recommendation: heart healthy/carb modified ? ?Filed Weights  ? 03/26/21 2128  ?Weight: 101 kg  ? ? ?History of present illness: (Per admitting MD Dr. OOlevia Bowens ? ?HPI: Gloria FORGIONEis a 76y.o. female with medical history significant of aortic stenosis, mitral regurgitation, nonsustained V. tach, history of CVA, cystocele with uterine prolapse, type 2 diabetes, hyperlipidemia, hypertension, fibromyalgia, GERD, type 2 diabetes [facility] with right flank pain in the setting of nephrolithiasis with right hydroureteronephrosis and perinephric stranding.  No history of hematuria or dysuria.  There was also concern for a fluid collection on the area of a recent right-sided hernia  repair who General surgery reviewed and seems to be a seroma.  No chest pain, dyspnea, dizziness, palpitations, recent lower extremity edema, diarrhea, constipation, melena or hematochezia. ?  ?Initial vital signs on arrival to the hospital were temperature 101.7 ?F, pulse 123, respiration 18, BP 103/88 mmHg O2 sat 98% on room air.  LR 1000 mL bolus, ceftriaxone 2 g IVPB and vancomycin 1 g IVPB ordered.  The patient was subsequently taken to the OR for cystoscopy without further delay.  CT findings of postop hernia fluid collection were discussed with Dr. RRosendo Groswho stated that this was most likely a seroma so vancomycin was discontinued. ?  ?Please see transfer packet for [facility]'s results. ?Hospital Course:  ?Principal Problem: ?  Sepsis secondary to UTI ([facility] ?Active Problems: ?  Hypertension ?  Hypercholesterolemia ?  GERD (gastroesophageal reflux disease) ?  Fibromyalgia ?  Hydronephrosis of right kidney ?  Class 3 obesity (HCC) ?  Type 2 diabetes mellitus with hyperglycemia (HCC) ?  Hypokalemia ?  AKI (acute kidney injury) ([facility] ?  Dizziness ? ? ?Assessment and Plan: ? ?* Sepsis secondary to UTI (HCC)/obstructing right ureteral stone /hydronephrosis of right kidney ?-Patient presents with fever, leukocytosis, tachycardia in the setting of urinary tract infection and obstructing stone.  ?-Blood culture no growth ?-Follow urine culture: <10,000 colonies  Insignificant growth and blood cultures: No growth to date.   ?-She underwent cystoscopy and ureteral stent placement on 3/12 by Dr GAbner Greenspan?-Received Rocephin in the hospital , WBC trending down 30---13.9, renal function normalized  ?-Successful voiding trial on 3/14 ?-Case discussed with Dr. GAbner Greenspanwho recommend 10 more days of oral antibiotic and outpatient follow-up with  him in the office  ? ?AKI on CKDII, azotemia, resolved ?BUN peak at 30, BUN 14 at discharge ?Creatinine peak at 1.52, creatinine normalized at 0.9 at discharge ? ?Dizziness ?She reported  lightheaded on 3/13.EKG sinus rhythm.  ?Vital  stable, cbg normal. Improved with fluids.  ?Resolved ? ?  ?Hypokalemia/hypomagnesemia ?Replace ? ?Hypertension ?Resume metoprolol.  ? Continue to hold hydrochlorothiazide and lisinopril due to AKI. ?Cr back to normal, bp start to increase ?Resume Lasix as needed for edema , resume lisinopril,  ?discontinue hydrochlorothiazide, discussed with patient who is agreeable, she is to follow-up with PCP ? ? ?History of aortic stenosis ?Recently seen by  cardiology Dr. Agustin Cree ?Stable, denies chest pain, no syncope ? ?Type 2 diabetes mellitus with hyperglycemia ([place]), non-insulin-dependent, controlled ?A1c 6.6% ? oral hypoglycemic agents out in the hospital ?Resumed at discharge ? ?Hypercholesterolemia ?Resume Tricor. ?She takes statin weekly ? ?Class 3 obesity (HCC) ?She will need lifestyle modifications. ? ? ?Fibromyalgia ?Resume gabapentin if renal function remain stable at discharge.  ? ?GERD (gastroesophageal reflux disease) ?Continue with Protonix ? ? ?Discharge Exam: ?BP (!) 170/60 (BP Location: Right Arm) Comment: provider notified  Pulse 74   Temp 98.3 ?F (36.8 ?C) (Oral)   Resp 19   Ht 5' (1.524 m)   Wt 101 kg   SpO2 96%   BMI 43.49 kg/m?  ? ?General: NAD ?Cardiovascular: RRR, + systolic murmur right upper sternal border, no edema ?Respiratory: Normal respiratory effort ? ? ? ?Discharge Instructions   ? ? Diet - low sodium heart healthy   Complete by: As directed ?  ? Carb modified  ? Increase activity slowly   Complete by: As directed ?  ? ?  ? ?Allergies as of 03/29/2021   ? ?   Reactions  ? Celecoxib Itching, Swelling  ? Swelling- whole body and turns red  ? Nabumetone Itching, Swelling  ? Ciprofloxacin Hives, Swelling, Rash  ? Flagyl [metronidazole] Itching, Swelling, Rash  ? ?  ? ?  ?Medication List  ?  ? ?STOP taking these medications   ? ?lisinopril-hydrochlorothiazide 20-12.5 MG tablet ?Commonly known as: ZESTORETIC ?  ? ?  ? ?TAKE these medications    ? ?acetaminophen 650 MG CR tablet ?Commonly known as: TYLENOL ?Take 650 mg by mouth daily as needed for pain. ?  ?alendronate 35 MG tablet ?Commonly known as: FOSAMAX ?Take 35 mg by mouth every 7 (seven) days. ?  ?brimonidine 0.15 % ophthalmic solution ?Commonly known as: ALPHAGAN ?Place 1 drop into both eyes 2 (two) times daily. ?  ?cephALEXin 500 MG capsule ?Commonly known as: KEFLEX ?Take 1 capsule (500 mg total) by mouth 3 (three) times daily for 10 days. ?Start taking on: March 30, 2021 ?  ?cholestyramine 4 g packet ?Commonly known as: Questran ?Take 1 packet (4 g total) by mouth daily. Take at least 2 hours before or after rest of the medications ?What changed:  ?when to take this ?additional instructions ?  ?clobetasol cream 0.05 % ?Commonly known as: TEMOVATE ?Apply 1 application. topically daily as needed (itching). ?  ?clopidogrel 75 MG tablet ?Commonly known as: PLAVIX ?Take 75 mg by mouth daily. ?  ?diclofenac Sodium 1 % Gel ?Commonly known as: VOLTAREN ?Apply 2-4 g topically at bedtime as needed (knee pain). ?  ?fenofibrate 145 MG tablet ?Commonly known as: TRICOR ?Take 145 mg by mouth daily. ?  ?ferrous sulfate 325 (65 FE) MG EC tablet ?Take 325 mg by mouth daily with lunch. ?  ?FISH OIL  PO ?Take 2,000 mg by mouth daily. ?  ?furosemide 20 MG tablet ?Commonly known as: LASIX ?Take 1 tablet (20 mg total) by mouth daily. ?What changed:  ?when to take this ?reasons to take this ?  ?gabapentin 300 MG capsule ?Commonly known as: NEURONTIN ?Take 300 mg by mouth 2 (two) times daily. ?  ?lisinopril 20 MG tablet ?Commonly known as: ZESTRIL ?Take 1 tablet (20 mg total) by mouth daily. ?Start taking on: March 30, 2021 ?  ?MAGNESIUM PO ?Take 500 mg by mouth daily. ?  ?metFORMIN 1000 MG tablet ?Commonly known as: GLUCOPHAGE ?Take 1,000 mg by mouth 2 (two) times daily with a meal. ?  ?metoprolol succinate 100 MG 24 hr tablet ?Commonly known as: TOPROL-XL ?TAKE ONE TABLET BY MOUTH EVERY DAY WITH OR IMMEDIATELY  FOLLOWING A MEAL ?What changed: See the new instructions. ?  ?OVER THE COUNTER MEDICATION ?Apply 1-4 patches topically daily as needed (back pain). ?  ?oxyCODONE 5 MG immediate release tablet ?Commonly known as: Ox

## 2021-03-29 NOTE — Progress Notes (Signed)
PT Cancellation Note ? ?Patient Details ?Name: Gloria Sharp ?MRN: [id number] ?DOB: [birth year] ? ? ?Cancelled Treatment:    Reason Eval/Treat Not Completed: PT screened, no needs identified, will sign off. Check with RN through secure chat-no acute PT needs. Pt is set to d/c home today. Will sign off. ? ? ? ?Doreatha Massed, PT ?Acute Rehabilitation  ?Office: [phone] ?Pager: [phone] ? ?  ?

## 2021-03-31 DIAGNOSIS — A419 Sepsis, unspecified organism: Secondary | ICD-10-CM | POA: Diagnosis not present

## 2021-03-31 DIAGNOSIS — N390 Urinary tract infection, site not specified: Secondary | ICD-10-CM | POA: Diagnosis not present

## 2021-03-31 DIAGNOSIS — N179 Acute kidney failure, unspecified: Secondary | ICD-10-CM | POA: Diagnosis not present

## 2021-03-31 DIAGNOSIS — Z7689 Persons encountering health services in other specified circumstances: Secondary | ICD-10-CM | POA: Diagnosis not present

## 2021-03-31 LAB — CULTURE, BLOOD (ROUTINE X 2)
Culture: NO GROWTH
Culture: NO GROWTH
Special Requests: ADEQUATE
Special Requests: ADEQUATE

## 2021-04-07 ENCOUNTER — Other Ambulatory Visit: Payer: Self-pay | Admitting: Urology

## 2021-04-09 ENCOUNTER — Other Ambulatory Visit: Payer: Self-pay

## 2021-04-09 ENCOUNTER — Emergency Department (HOSPITAL_COMMUNITY): Payer: Medicare HMO

## 2021-04-09 ENCOUNTER — Encounter (HOSPITAL_COMMUNITY): Payer: Self-pay

## 2021-04-09 ENCOUNTER — Emergency Department (HOSPITAL_COMMUNITY)
Admission: EM | Admit: 2021-04-09 | Discharge: 2021-04-09 | Disposition: A | Discharge status: Home / Self Care | Payer: Medicare HMO | Attending: Emergency Medicine | Admitting: Emergency Medicine

## 2021-04-09 DIAGNOSIS — R109 Unspecified abdominal pain: Secondary | ICD-10-CM | POA: Diagnosis not present

## 2021-04-09 DIAGNOSIS — M47816 Spondylosis without myelopathy or radiculopathy, lumbar region: Secondary | ICD-10-CM | POA: Diagnosis not present

## 2021-04-09 DIAGNOSIS — R1031 Right lower quadrant pain: Secondary | ICD-10-CM | POA: Insufficient documentation

## 2021-04-09 DIAGNOSIS — N200 Calculus of kidney: Secondary | ICD-10-CM | POA: Diagnosis not present

## 2021-04-09 DIAGNOSIS — N1330 Unspecified hydronephrosis: Secondary | ICD-10-CM | POA: Diagnosis not present

## 2021-04-09 DIAGNOSIS — Z466 Encounter for fitting and adjustment of urinary device: Secondary | ICD-10-CM | POA: Diagnosis not present

## 2021-04-09 DIAGNOSIS — T300 Burn of unspecified body region, unspecified degree: Secondary | ICD-10-CM | POA: Diagnosis not present

## 2021-04-09 LAB — CBC
HCT: 32.3 % — ABNORMAL LOW (ref 36.0–46.0)
Hemoglobin: 10.4 g/dL — ABNORMAL LOW (ref 12.0–15.0)
MCH: 28.7 pg (ref 26.0–34.0)
MCHC: 32.2 g/dL (ref 30.0–36.0)
MCV: 89.2 fL (ref 80.0–100.0)
Platelets: 506 10*3/uL — ABNORMAL HIGH (ref 150–400)
RBC: 3.62 MIL/uL — ABNORMAL LOW (ref 3.87–5.11)
RDW: 14 % (ref 11.5–15.5)
WBC: 13.7 10*3/uL — ABNORMAL HIGH (ref 4.0–10.5)
nRBC: 0 % (ref 0.0–0.2)

## 2021-04-09 LAB — URINALYSIS, MICROSCOPIC (REFLEX): RBC / HPF: >50 RBC/hpf (ref 0–5)

## 2021-04-09 LAB — URINALYSIS, ROUTINE W REFLEX MICROSCOPIC
Glucose, UA: 100 mg/dL — AB
Ketones, ur: NEGATIVE mg/dL
Nitrite: POSITIVE — AB
Protein, ur: >300 mg/dL — AB
Specific Gravity, Urine: 1.02 (ref 1.005–1.030)
pH: 5 (ref 5.0–8.0)

## 2021-04-09 LAB — BASIC METABOLIC PANEL
Anion gap: 8 (ref 5–15)
BUN: 21 mg/dL (ref 8–23)
CO2: 24 mmol/L (ref 22–32)
Calcium: 9.1 mg/dL (ref 8.9–10.3)
Chloride: 110 mmol/L (ref 98–111)
Creatinine, Ser: 1.05 mg/dL — ABNORMAL HIGH (ref 0.44–1.00)
GFR, Estimated: 56 mL/min — ABNORMAL LOW (ref >60)
Glucose, Bld: 158 mg/dL — ABNORMAL HIGH (ref 70–99)
Potassium: 4.3 mmol/L (ref 3.5–5.1)
Sodium: 142 mmol/L (ref 135–145)

## 2021-04-09 MED ORDER — KETAMINE HCL 50 MG/5ML IJ SOSY
0.3000 mg/kg | PREFILLED_SYRINGE | Freq: Once | INTRAMUSCULAR | Status: AC
  Administered 2021-04-09: 30 mg via INTRAVENOUS
  Filled 2021-04-09: qty 5

## 2021-04-09 MED ORDER — OXYCODONE HCL 5 MG PO TABS: 5.0000 mg | ORAL_TABLET | ORAL | 0 refills | Status: DC | PRN

## 2021-04-09 MED ORDER — ONDANSETRON HCL 4 MG PO TABS: 4.0000 mg | ORAL_TABLET | Freq: Three times a day (TID) | ORAL | 0 refills | Status: DC | PRN

## 2021-04-09 NOTE — Discharge Instructions (Addendum)
Contact a health care provider if: ?Your pain is not controlled with medicine. ?You have new symptoms. ?Your pain gets worse. ?Your symptoms last longer than 2-3 days. ?You have trouble urinating or you are urinating very frequently. ?Get help right away if: ?You have trouble breathing or you are short of breath. ?Your abdomen hurts or it is swollen or red. ?You have nausea or vomiting. ?You feel faint, or you faint. ?You have blood in your urine. ?You have flank pain and a fever. ?

## 2021-04-09 NOTE — ED Provider Notes (Signed)
?[facility] DEPT ?Provider Note ? ? ?CSN: [id number] ?Arrival date & time: 04/09/21  1821 ? ?  ? ?History ? ?No chief complaint on file. ? ? ?Gloria Sharp is a 76 y.o. female who presents emergency department with chief complaint of flank pain.  Patient reports recent history of large stone requiring placement of ureteral stent.  Patient states that she is scheduled to have a lithotripsy this coming Tuesday.  Yesterday she began having increasing pain in her right flank.  She has been taking oxycodone however this is not been adequately covering her pain over the last 24 hours.  She spoke to Dr. Gloriann Loan who was on-call who told her to try taking Azo Standard and Tylenol with her oxycodone.  Today she was at her granddaughter's birthday party and states that her pain became unbearable, she came to the ER after taking oxycodone and calling EMS.  She complains of severe, sharp, colicky, right-sided flank pain.  She has dark urination is unsure if this is due to blood or Azo Standard.  She denies nausea or vomiting.  She denies fever or chills. ?HPI ? ?  ? ?Home Medications ?Prior to Admission medications   ?Medication Sig Start Date End Date Taking? Authorizing Provider  ?acetaminophen (TYLENOL) 650 MG CR tablet Take 650 mg by mouth daily as needed for pain.    [provider]  ?alendronate (FOSAMAX) 35 MG tablet Take 35 mg by mouth every 7 (seven) days.    [provider]  ?brimonidine (ALPHAGAN) 0.15 % ophthalmic solution Place 1 drop into both eyes 2 (two) times daily. 08/10/19   [provider]  ?cephALEXin (KEFLEX) 500 MG capsule Take 1 capsule (500 mg total) by mouth 3 (three) times daily for 10 days. 03/30/21 04/09/21  Florencia Reasons, MD  ?cholestyramine Lucrezia Starch) 4 g packet Take 1 packet (4 g total) by mouth daily. Take at least 2 hours before or after rest of the medications ?Patient taking differently: Take 4 g by mouth See admin instructions. Every 3 days 03/25/19    Jackquline Denmark, MD  ?clobetasol cream (TEMOVATE) 3.84 % Apply 1 application. topically daily as needed (itching).    [provider]  ?clopidogrel (PLAVIX) 75 MG tablet Take 75 mg by mouth daily. 01/16/21   [provider]  ?diclofenac Sodium (VOLTAREN) 1 % GEL Apply 2-4 g topically at bedtime as needed (knee pain).    [provider]  ?fenofibrate (TRICOR) 145 MG tablet Take 145 mg by mouth daily.    [provider]  ?ferrous sulfate 325 (65 FE) MG EC tablet Take 325 mg by mouth daily with lunch.    [provider]  ?furosemide (LASIX) 20 MG tablet Take 1 tablet (20 mg total) by mouth daily. ?Patient taking differently: Take 20 mg by mouth daily as needed (swelling). 03/11/20 05/20/21  Park Liter, MD  ?gabapentin (NEURONTIN) 300 MG capsule Take 300 mg by mouth 2 (two) times daily.    [provider]  ?lisinopril (ZESTRIL) 20 MG tablet Take 1 tablet (20 mg total) by mouth daily. 03/30/21 04/29/21  Florencia Reasons, MD  ?MAGNESIUM PO Take 500 mg by mouth daily.    [provider]  ?metFORMIN (GLUCOPHAGE) 1000 MG tablet Take 1,000 mg by mouth 2 (two) times daily with a meal.    [provider]  ?metoprolol succinate (TOPROL-XL) 100 MG 24 hr tablet TAKE ONE TABLET BY MOUTH EVERY DAY WITH OR IMMEDIATELY FOLLOWING A MEAL ?Patient taking differently:  Take 100 mg by mouth daily. 02/28/21   Park Liter, MD  ?Omega-3 Fatty Acids (FISH OIL PO) Take 2,000 mg by mouth daily.    [provider]  ?OVER THE COUNTER MEDICATION Apply 1-4 patches topically daily as needed (back pain).    [provider]  ?oxyCODONE (OXY IR/ROXICODONE) 5 MG immediate release tablet Take 1 tablet (5 mg total) by mouth every 6 (six) hours as needed for breakthrough pain or moderate pain. 03/29/21   Florencia Reasons, MD  ?pantoprazole (PROTONIX) 40 MG tablet Take 40 mg by mouth daily as needed (acid reflux).    [provider]  ?pioglitazone (ACTOS) 15 MG tablet  Take 15 mg by mouth daily. 01/25/20   [provider]  ?pravastatin (PRAVACHOL) 20 MG tablet Take 20 mg by mouth once a week.    [provider]  ?Semaglutide, 1 MG/DOSE, (OZEMPIC, 1 MG/DOSE,) 2 MG/1.5ML SOPN Inject 1 mg into the skin once a week.    [provider]  ?TURMERIC PO Take 1 tablet by mouth daily.    [provider]  ?vitamin B-12 (CYANOCOBALAMIN) 1000 MCG tablet Take 1,000 mcg by mouth daily.    [provider]  ?   ? ?Allergies    ?Celecoxib, Nabumetone, Ciprofloxacin, and Flagyl [metronidazole]   ? ?Review of Systems   ?Review of Systems ? ?Physical Exam ?Updated Vital Signs ?There were no vitals taken for this visit. ?Physical Exam ?Vitals and nursing note reviewed.  ?Constitutional:   ?   General: She is not in acute distress. ?   Appearance: She is well-developed. She is not diaphoretic.  ?HENT:  ?   Head: Normocephalic and atraumatic.  ?   Right Ear: External ear normal.  ?   Left Ear: External ear normal.  ?   Nose: Nose normal.  ?   Mouth/Throat:  ?   Mouth: Mucous membranes are moist.  ?Eyes:  ?   General: No scleral icterus. ?   Conjunctiva/sclera: Conjunctivae normal.  ?Cardiovascular:  ?   Rate and Rhythm: Normal rate and regular rhythm.  ?   Heart sounds: Normal heart sounds. No murmur heard. ?  No friction rub. No gallop.  ?Pulmonary:  ?   Effort: Pulmonary effort is normal. No respiratory distress.  ?   Breath sounds: Normal breath sounds.  ?Abdominal:  ?   General: Bowel sounds are normal. There is no distension.  ?   Palpations: Abdomen is soft. There is no mass.  ?   Tenderness: There is no abdominal tenderness. There is no guarding.  ?Musculoskeletal:  ?   Cervical back: Normal range of motion.  ?Skin: ?   General: Skin is warm and dry.  ?Neurological:  ?   Mental Status: She is alert and oriented to person, place, and time.  ?Psychiatric:     ?   Behavior: Behavior normal.  ? ? ?ED Results / Procedures / Treatments   ?Labs ?(all labs  ordered are listed, but only abnormal results are displayed) ?Labs Reviewed  ?BASIC METABOLIC PANEL  ?CBC  ?URINALYSIS, ROUTINE W REFLEX MICROSCOPIC  ? ? ?EKG ?None ? ?Radiology ?No results found. ? ?Procedures ?Procedures  ? ? ?Medications Ordered in ED ?Medications  ?ketamine 50 mg in normal saline 5 mL (10 mg/mL) syringe (has no administration in time range)  ? ? ?ED Course/ Medical Decision Making/ A&P ?Clinical Course as of 04/11/21 2224  ?Sun Apr 09, 2021  ?2024 Case discussed with Dr. Lovena Neighbours- No acute  findings- recommends pain control [AH]  ?  ?Clinical Course User Index ?[AH] Margarita Mail, PA-C  ? ?                        ?Medical Decision Making ?Patient here with Flank pain.  ?The differential diagnosis of emergent flank pain includes, but is not limited to :Abdominal aortic aneurysm,, Renal artery embolism,Renal vein thrombosis, Aortic dissection, Mesenteric ischemia, Pyelonephritis, Renal infarction, Renal hemorrhage, Nephrolithiasis/ Renal Colic, ?Bladder tumor,Cystitis, Biliary colic, Pancreatitis ?Perforated peptic ulcer Appendicitis ,Inguinal Hernia, Diverticulitis, Bowel obstruction,Shingles ?Lower lobe pneumonia, Retroperitoneal hematoma/abscess/tumor, Epidural abscess, Epidural hematoma ? ?Repeat scan shows no acute changes.  ?Case discussed with Dr. Lovena Neighbours plan dc with close follow up as pain is controlled. ?PDMP reviewed during this encounter. ?No signs of UTINo  ? ? ? ?Problems Addressed: ?Flank pain: acute illness or injury ? ?Amount and/or Complexity of Data Reviewed ?Independent Historian: spouse ?Labs: ordered. ?   Details: NO increased Creatinine, no UTI ?Radiology: ordered and independent interpretation performed. ?   Details: I reviewd CT imaging, unchanged from previous ? ?Risk ?Prescription drug management. ? ?Final Clinical Impression(s) / ED Diagnoses ?Final diagnoses:  ?None  ? ? ?Rx / DC Orders ?ED Discharge Orders   ? ? None  ? ?  ? ? ?  ?Margarita Mail, PA-C ?04/11/21  2234 ? ?  ?Lennice Sites, DO ?04/12/21 9166 ? ?

## 2021-04-09 NOTE — ED Notes (Signed)
Discharge instructions including prescriptions and pain management discussed with pt. Pt verbalized understanding with no questions at this time. Pt to go home with spouse at bedside.  ?

## 2021-04-09 NOTE — ED Notes (Signed)
Patient transported to CT 

## 2021-04-09 NOTE — ED Triage Notes (Signed)
Pt BIB EMS with c/o kidney stone pain. Pt states that the oxycodone that they were prescribed is not helping the pain. Pt last took oxycodone at 1630 today. Per EMS pt is poor historian.  ?

## 2021-04-11 ENCOUNTER — Other Ambulatory Visit: Payer: Self-pay

## 2021-04-11 ENCOUNTER — Encounter (HOSPITAL_COMMUNITY): Payer: Self-pay

## 2021-04-11 DIAGNOSIS — Z01818 Encounter for other preprocedural examination: Secondary | ICD-10-CM | POA: Diagnosis not present

## 2021-04-11 NOTE — Progress Notes (Signed)
Anesthesia note: ? ?Bowel prep reminder:NA ? ?PCP - Dr. Sheral Apley ?Cardiologist -Dr. Nelta Numbers ?Other-  ? ?Chest x-ray - no ?EKG - 03/27/21-epic ?Stress Test - 02/15/21-epic ?ECHO - 09/15/20-epic ?Cardiac Cath - na ?na ?Pacemaker/ICD device last checked: ? ?Sleep Study - na ?CPAP -  ? ?Pt is pre diabetic- ?Fasting Blood Sugar - 116-132 ?Checks Blood Sugar _QD____ ? ?Blood Thinner:Plavix/ Dr. Agustin Cree ?Blood Thinner Instructions:She stopped 04/01/21 ?Aspirin Instructions: ?Last Dose: ? ?Anesthesia review: yes ? ?Patient denies shortness of breath, fever, cough and chest pain at PAT appointment ?Pt has no SOB with activities. She uses a cane as needed. She had hernia surgery in feb 2023 and has fluid gathering where the hernia was that needs to be drawn off Last draining is 04/12/21. ? ?Patient verbalized understanding of instructions that were given to them at the PAT appointment. Patient was also instructed that they will need to review over the PAT instructions again at home before surgery. Yes . Pt's PAT was done by phone she will stop by to pick up the bag with instructions and soap on 04/12/21 ?

## 2021-04-11 NOTE — Patient Instructions (Addendum)
2 VISITORS  (aged 74 and older)  ARE ALLOWED TO COME WITH YOU AND STAY IN THE WAITING ROOM ONLY DURING PRE OP AND PROCEDURE.   ?**NO VISITORS ARE ALLOWED IN THE SHORT STAY AREA OR RECOVERY ROOM!!** ? ? ? Your procedure is scheduled on: 04/17/21 ? ? Report to [facility] Main Entrance ? ?  Report to admitting at 11:45 AM ? ? Call this number if you have problems the morning of surgery [phone] ? ? Do not eat food :After Midnight. ? ? After Midnight you may have the following liquids until _8:00_____ AM DAY OF SURGERY ? ?Water ?Black Coffee (sugar ok, NO MILK/CREAM OR CREAMERS)  ?Tea (sugar ok, NO MILK/CREAM OR CREAMERS) regular and decaf                             ?Plain Jell-O (NO RED)                                           ?Fruit ices (not with fruit pulp, NO RED)                                     ?Popsicles (NO RED)                                                                  ?Juice: apple, WHITE grape, WHITE cranberry ?Sports drinks like Gatorade (NO RED) ?Clear broth(vegetable,chicken,beef) ? ? ?               If you have questions, please contact your surgeon?s office. ? ? ?  ?  ?Oral Hygiene is also important to reduce your risk of infection.                                    ?Remember - BRUSH YOUR TEETH THE MORNING OF SURGERY WITH YOUR REGULAR TOOTHPASTE ? ? ? Take these medicines the morning of surgery with A SIP OF WATER: Gabapentin, Metoprolol ?How to Manage Your Diabetes ?Before and After Surgery ? ?Why is it important to control my blood sugar before and after surgery? ?Improving blood sugar levels before and after surgery helps healing and can limit problems. ?A way of improving blood sugar control is eating a healthy diet by: ? Eating less sugar and carbohydrates ? Increasing activity/exercise ? Talking with your doctor about reaching your blood sugar goals ?High blood sugars (greater than 180 mg/dL) can raise your risk of infections and slow your recovery, so you will need to  focus on controlling your diabetes during the weeks before surgery. ?Make sure that the doctor who takes care of your diabetes knows about your planned surgery including the date and location. ? ?How do I manage my blood sugar before surgery? ?Check your blood sugar at least 4 times a day, starting 2 days before surgery, to make sure that the level is not too high or low. ?Check your blood sugar the  morning of your surgery when you wake up and every 2 hours until you get to the Short Stay unit. ?If your blood sugar is less than 70 mg/dL, you will need to treat for low blood sugar: ?Do not take insulin. ?Treat a low blood sugar (less than 70 mg/dL) with ? cup of clear juice (cranberry or apple), 4 glucose tablets, OR glucose gel. ?Recheck blood sugar in 15 minutes after treatment (to make sure it is greater than 70 mg/dL). If your blood sugar is not greater than 70 mg/dL on recheck, call [phone] for further instructions. ?Report your blood sugar to the short stay nurse when you get to Short Stay. ? ?If you are admitted to the hospital after surgery: ?Your blood sugar will be checked by the staff and you will probably be given insulin after surgery (instead of oral diabetes medicines) to make sure you have good blood sugar levels. ?The goal for blood sugar control after surgery is 80-180 mg/dL. ? ? ?WHAT DO I DO ABOUT MY DIABETES MEDICATION? ? ?Do not take oral diabetes medicines (pills) the morning of surgery. ? ? ?The day of surgery, do not take other diabetes injectables, including Byetta (exenatide), Bydureon (exenatide ER), Victoza (liraglutide), or Trulicity (dulaglutide). ? ? ?                  ?           You may not have any metal on your body including hair pins, jewelry, and body piercing ? ?           Do not wear make-up, lotions, powders, perfumes/cologne, or deodorant ? ?Do not wear nail polish including gel and S&S, artificial/acrylic nails, or any other type of covering on natural nails including  finger and toenails. If you have artificial nails, gel coating, etc. that needs to be removed by a nail salon please have this removed prior to surgery or surgery may need to be canceled/ delayed if the surgeon/ anesthesia feels like they are unable to be safely monitored.  ? ?Do not shave  48 hours prior to surgery.  ? ? ? Do not bring valuables to the hospital. [facility] NOT ?            RESPONSIBLE   FOR VALUABLES. ? ? Contacts, dentures or bridgework may not be worn into surgery. ? ?  ? Patients discharged on the day of surgery will not be allowed to drive home.  Someone NEEDS to stay with you for the first 24 hours after anesthesia. ? ? Special Instructions: Bring a copy of your healthcare power of attorney and living will documents the day of surgery if you haven't scanned them before. ? ?            Please read over the following fact sheets you were given: IF [place] [phone] ? ?   [place] - Preparing for Surgery ?Before surgery, you can play an important role.  Because skin is not sterile, your skin needs to be as free of germs as possible.  You can reduce the number of germs on your skin by washing with CHG (chlorahexidine gluconate) soap before surgery.  CHG is an antiseptic cleaner which kills germs and bonds with the skin to continue killing germs even after washing. ?Please DO NOT use if you have an allergy to CHG or antibacterial soaps.  If your skin becomes reddened/irritated stop using the CHG  and inform your nurse when you arrive at Short Stay. ?Do not shave (including legs and underarms) for at least 48 hours prior to the first CHG shower.  ?Please follow these instructions carefully: ? 1.  Shower with CHG Soap the night before surgery and the  morning of Surgery. ? 2.  If you choose to wash your hair, wash your hair first as usual with your  normal  shampoo. ? 3.  After you shampoo, rinse your hair and body thoroughly to remove  the  shampoo.                     ?       4.  Use CHG as you would any other liquid soap.  You can apply chg directly  to the skin and wash  ?                     Gently with a scrungie or clean washcloth. ? 5.  Apply the CHG Soap to your body ONLY FROM THE NECK DOWN.   Do not use on face/ open      ?                     Wound or open sores. Avoid contact with eyes, ears mouth and genitals (private parts).  ?                     Production manager,  Genitals (private parts) with your normal soap. ?            6.  Wash thoroughly, paying special attention to the area where your surgery  will be performed. ? 7.  Thoroughly rinse your body with warm water from the neck down. ? 8.  DO NOT shower/wash with your normal soap after using and rinsing off  the CHG Soap. ?               9.  Pat yourself dry with a clean towel. ?           10.  Wear clean pajamas. ?           11.  Place clean sheets on your bed the night of your first shower and do not  sleep with pets. ?Day of Surgery : ?Do not apply any lotions/deodorants the morning of surgery.  Please wear clean clothes to the hospital/surgery center. ? ?FAILURE TO FOLLOW THESE INSTRUCTIONS MAY RESULT IN THE CANCELLATION OF YOUR SURGERY ? ? ? ?________________________________________________________________________  ?

## 2021-04-12 ENCOUNTER — Encounter (HOSPITAL_COMMUNITY)
Admission: RE | Admit: 2021-04-12 | Discharge: 2021-04-12 | Disposition: A | Discharge status: Home / Self Care | Payer: Medicare HMO | Source: Ambulatory Visit | Attending: Urology | Admitting: Urology

## 2021-04-12 DIAGNOSIS — Z01818 Encounter for other preprocedural examination: Secondary | ICD-10-CM | POA: Insufficient documentation

## 2021-04-12 DIAGNOSIS — N202 Calculus of kidney with calculus of ureter: Secondary | ICD-10-CM | POA: Diagnosis not present

## 2021-04-12 DIAGNOSIS — N130 Hydronephrosis with ureteropelvic junction obstruction: Secondary | ICD-10-CM | POA: Diagnosis not present

## 2021-04-15 DIAGNOSIS — E1159 Type 2 diabetes mellitus with other circulatory complications: Secondary | ICD-10-CM | POA: Diagnosis not present

## 2021-04-15 DIAGNOSIS — E782 Mixed hyperlipidemia: Secondary | ICD-10-CM | POA: Diagnosis not present

## 2021-04-17 ENCOUNTER — Encounter (HOSPITAL_COMMUNITY)
Admission: RE | Disposition: A | Discharge status: Home / Self Care | Payer: Self-pay | Source: Home / Self Care | Attending: Urology

## 2021-04-17 ENCOUNTER — Encounter (HOSPITAL_COMMUNITY): Payer: Self-pay | Admitting: Urology

## 2021-04-17 ENCOUNTER — Ambulatory Visit (HOSPITAL_BASED_OUTPATIENT_CLINIC_OR_DEPARTMENT_OTHER): Payer: Medicare HMO | Admitting: Certified Registered"

## 2021-04-17 ENCOUNTER — Ambulatory Visit (HOSPITAL_COMMUNITY)
Admission: RE | Admit: 2021-04-17 | Discharge: 2021-04-17 | Disposition: A | Discharge status: Home / Self Care | Payer: Medicare HMO | Attending: Urology | Admitting: Urology

## 2021-04-17 ENCOUNTER — Ambulatory Visit (HOSPITAL_COMMUNITY): Payer: Medicare HMO

## 2021-04-17 ENCOUNTER — Other Ambulatory Visit: Payer: Self-pay

## 2021-04-17 ENCOUNTER — Ambulatory Visit (HOSPITAL_COMMUNITY): Payer: Medicare HMO | Admitting: Physician Assistant

## 2021-04-17 DIAGNOSIS — Z7984 Long term (current) use of oral hypoglycemic drugs: Secondary | ICD-10-CM | POA: Insufficient documentation

## 2021-04-17 DIAGNOSIS — E119 Type 2 diabetes mellitus without complications: Secondary | ICD-10-CM | POA: Diagnosis not present

## 2021-04-17 DIAGNOSIS — N202 Calculus of kidney with calculus of ureter: Secondary | ICD-10-CM | POA: Diagnosis not present

## 2021-04-17 DIAGNOSIS — N200 Calculus of kidney: Secondary | ICD-10-CM

## 2021-04-17 DIAGNOSIS — M797 Fibromyalgia: Secondary | ICD-10-CM | POA: Insufficient documentation

## 2021-04-17 DIAGNOSIS — I10 Essential (primary) hypertension: Secondary | ICD-10-CM

## 2021-04-17 DIAGNOSIS — K219 Gastro-esophageal reflux disease without esophagitis: Secondary | ICD-10-CM | POA: Diagnosis not present

## 2021-04-17 HISTORY — PX: CYSTOSCOPY/URETEROSCOPY/HOLMIUM LASER/STENT PLACEMENT: SHX6546

## 2021-04-17 LAB — GLUCOSE, CAPILLARY: Glucose-Capillary: 113 mg/dL — ABNORMAL HIGH (ref 70–99)

## 2021-04-17 SURGERY — CYSTOSCOPY/URETEROSCOPY/HOLMIUM LASER/STENT PLACEMENT
Anesthesia: General | Site: Ureter | Laterality: Right

## 2021-04-17 MED ORDER — CEFAZOLIN SODIUM-DEXTROSE 2-4 GM/100ML-% IV SOLN
2.0000 g | INTRAVENOUS | Status: AC
  Administered 2021-04-17: 2 g via INTRAVENOUS
  Filled 2021-04-17: qty 100

## 2021-04-17 MED ORDER — DEXAMETHASONE SODIUM PHOSPHATE 10 MG/ML IJ SOLN
INTRAMUSCULAR | Status: AC
  Filled 2021-04-17: qty 1

## 2021-04-17 MED ORDER — FENTANYL CITRATE (PF) 100 MCG/2ML IJ SOLN
INTRAMUSCULAR | Status: AC
  Filled 2021-04-17: qty 2

## 2021-04-17 MED ORDER — SODIUM CHLORIDE 0.9 % IR SOLN
Status: DC | PRN
  Administered 2021-04-17: 1000 mL

## 2021-04-17 MED ORDER — SODIUM CHLORIDE 0.9 % IR SOLN
Status: DC | PRN
  Administered 2021-04-17: 3000 mL

## 2021-04-17 MED ORDER — FENTANYL CITRATE PF 50 MCG/ML IJ SOSY
25.0000 ug | PREFILLED_SYRINGE | INTRAMUSCULAR | Status: DC | PRN
  Administered 2021-04-17: 50 ug via INTRAVENOUS

## 2021-04-17 MED ORDER — PROPOFOL 10 MG/ML IV BOLUS
INTRAVENOUS | Status: AC
  Filled 2021-04-17: qty 20

## 2021-04-17 MED ORDER — ONDANSETRON HCL 4 MG/2ML IJ SOLN
INTRAMUSCULAR | Status: DC | PRN
  Administered 2021-04-17: 4 mg via INTRAVENOUS

## 2021-04-17 MED ORDER — ORAL CARE MOUTH RINSE: 15.0000 mL | Freq: Once | OROMUCOSAL | Status: AC

## 2021-04-17 MED ORDER — DOCUSATE SODIUM 100 MG PO CAPS
100.0000 mg | ORAL_CAPSULE | Freq: Every day | ORAL | 0 refills | Status: DC | PRN
Start: 2021-04-17 — End: 2022-05-15

## 2021-04-17 MED ORDER — PROPOFOL 10 MG/ML IV BOLUS
INTRAVENOUS | Status: DC | PRN
  Administered 2021-04-17: 150 mg via INTRAVENOUS

## 2021-04-17 MED ORDER — IOHEXOL 300 MG/ML  SOLN
INTRAMUSCULAR | Status: DC | PRN
  Administered 2021-04-17: 7 mL

## 2021-04-17 MED ORDER — FENTANYL CITRATE PF 50 MCG/ML IJ SOSY
PREFILLED_SYRINGE | INTRAMUSCULAR | Status: AC
  Filled 2021-04-17: qty 1

## 2021-04-17 MED ORDER — CHLORHEXIDINE GLUCONATE 0.12 % MT SOLN
15.0000 mL | Freq: Once | OROMUCOSAL | Status: AC
  Administered 2021-04-17: 15 mL via OROMUCOSAL

## 2021-04-17 MED ORDER — ONDANSETRON HCL 4 MG/2ML IJ SOLN
INTRAMUSCULAR | Status: AC
  Filled 2021-04-17: qty 2

## 2021-04-17 MED ORDER — FENTANYL CITRATE (PF) 100 MCG/2ML IJ SOLN
INTRAMUSCULAR | Status: DC | PRN
  Administered 2021-04-17 (×2): 50 ug via INTRAVENOUS

## 2021-04-17 MED ORDER — DEXAMETHASONE SODIUM PHOSPHATE 10 MG/ML IJ SOLN
INTRAMUSCULAR | Status: DC | PRN
  Administered 2021-04-17: 4 mg via INTRAVENOUS

## 2021-04-17 MED ORDER — LIDOCAINE 2% (20 MG/ML) 5 ML SYRINGE
INTRAMUSCULAR | Status: DC | PRN
  Administered 2021-04-17: 60 mg via INTRAVENOUS

## 2021-04-17 MED ORDER — LACTATED RINGERS IV SOLN
INTRAVENOUS | Status: DC
Start: 2021-04-17 — End: 2021-04-17

## 2021-04-17 MED ORDER — ACETAMINOPHEN 10 MG/ML IV SOLN: 1000.0000 mg | Freq: Once | INTRAVENOUS | Status: DC | PRN

## 2021-04-17 MED ORDER — CEPHALEXIN 500 MG PO CAPS
500.0000 mg | ORAL_CAPSULE | Freq: Two times a day (BID) | ORAL | 0 refills | Status: AC
Start: 2021-04-17 — End: 2021-04-20

## 2021-04-17 MED ORDER — EPHEDRINE 5 MG/ML INJ
INTRAVENOUS | Status: AC
  Filled 2021-04-17: qty 5

## 2021-04-17 MED ORDER — EPHEDRINE SULFATE-NACL 50-0.9 MG/10ML-% IV SOSY
PREFILLED_SYRINGE | INTRAVENOUS | Status: DC | PRN
  Administered 2021-04-17: 5 mg via INTRAVENOUS

## 2021-04-17 MED ORDER — OXYCODONE HCL 5 MG PO TABS: 5.0000 mg | ORAL_TABLET | ORAL | 0 refills | Status: DC | PRN

## 2021-04-17 SURGICAL SUPPLY — 20 items
BAG URO CATCHER STRL LF (MISCELLANEOUS) ×2 IMPLANT
BASKET ZERO TIP NITINOL 2.4FR (BASKET) ×1 IMPLANT
CATH URETL OPEN 5X70 (CATHETERS) ×2 IMPLANT
CLOTH BEACON ORANGE TIMEOUT ST (SAFETY) ×2 IMPLANT
FIBER LASER MOSES 200 DFL (Laser) IMPLANT
GLOVE SURG ENC TEXT LTX SZ7 (GLOVE) ×2 IMPLANT
GUIDEWIRE STR DUAL SENSOR (WIRE) ×4 IMPLANT
GUIDEWIRE ZIPWRE .038 STRAIGHT (WIRE) IMPLANT
KIT TURNOVER KIT A (KITS) IMPLANT
LASER FIB FLEXIVA PULSE ID 365 (Laser) IMPLANT
MANIFOLD NEPTUNE II (INSTRUMENTS) ×2 IMPLANT
PACK CYSTO (CUSTOM PROCEDURE TRAY) ×2 IMPLANT
SHEATH DILATOR SET 8/10 (MISCELLANEOUS) ×1 IMPLANT
SHEATH URETERAL 12FR 45CM (SHEATH) IMPLANT
SHEATH URETERAL 12FRX35CM (MISCELLANEOUS) ×1 IMPLANT
STENT URET 6FRX24 CONTOUR (STENTS) ×1 IMPLANT
TRACTIP FLEXIVA PULS ID 200XHI (Laser) IMPLANT
TRACTIP FLEXIVA PULSE ID 200 (Laser) ×1
TUBING CONNECTING 10 (TUBING) ×2 IMPLANT
TUBING UROLOGY SET (TUBING) ×2 IMPLANT

## 2021-04-17 NOTE — Anesthesia Procedure Notes (Signed)
Procedure Name: LMA Insertion ?Date/Time: 04/17/2021 2:06 PM ?Performed by: Eben Burow, CRNA ?Pre-anesthesia Checklist: Patient identified, Emergency Drugs available, Suction available, Patient being monitored and Timeout performed ?Patient Re-evaluated:Patient Re-evaluated prior to induction ?Oxygen Delivery Method: Circle system utilized ?Preoxygenation: Pre-oxygenation with 100% oxygen ?Induction Type: IV induction ?Ventilation: Mask ventilation without difficulty ?LMA: LMA inserted and LMA with gastric port inserted ?LMA Size: 4.0 ?Number of attempts: 1 ?Tube secured with: Tape ?Dental Injury: Teeth and Oropharynx as per pre-operative assessment  ? ? ? ? ?

## 2021-04-17 NOTE — Op Note (Signed)
Operative Note ? ?Preoperative diagnosis:  ?1.  Right ureteral and renal stone ? ?Postoperative diagnosis: ?1.  Right renal stone ? ?Procedure(s): ?1.  Cystoscopy ?2. Right ureteroscopy with laser lithotripsy and basket extraction of stones ?3. Right retrograde pyelogram ?4. Right ureteral stent exchange ?5. Fluoroscopy with intraoperative interpretation ? ?Surgeon: Rexene Alberts, MD ? ?Assistants:  None ? ?Anesthesia:  General ? ?Complications:  None ? ?EBL:  Minimal ? ?Specimens: ?1. Stones for stone analysis (to be done at [facility]) ? ?Drains/Catheters: ?1.  Right 6Fr x 24cm ureteral stent WITH a tether string ? ?Intraoperative findings:   ?Cystoscopy demonstrated no suspicious bladder lesions. ?Right ureteroscopy demonstrated several small stones in the right lower pole behind an opened calyx. ?Successful right stent placement. ? ?Indication:  Gloria Sharp is a 76 y.o. female with a right ureteral and renal stone here for definitive stone treatment. ? ?Description of procedure: ?After informed consent was obtained from the patient, the patient was identified and taken to the operating room and placed in the supine position.  General anesthesia was administered as well as perioperative IV antibiotics.  At the beginning of the case, a time-out was performed to properly identify the patient, the surgery to be performed, and the surgical site.  Sequential compression devices were applied to the lower extremities at the beginning of the case for DVT prophylaxis.  The patient was then placed in the dorsal lithotomy supine position, prepped and draped in sterile fashion. ? ?Preliminary scout fluoroscopy revealed that there was a 68m calcification area at the right lower pole, which corresponds to the stone found on the preoperative CT scan. We then passed the 21-French rigid cystoscope through the urethra and into the bladder under vision without any difficulty.  A systematic evaluation of the bladder revealed  no evidence of any suspicious bladder lesions.  Ureteral orifices were in normal position.   ? ?The distal aspect of the ureteral stent was seen protruding from the right ureteral orifice.  We then used the alligator-tooth forceps and grasped the distal end of the ureteral stent and brought it out the urethral meatus while watching the proximal coil straighten out nicely on fluoroscopy. Through the ureteral stent, we then passed a 0.038 sensor wire up to the level of the renal pelvis.  The ureteral stent was then removed, leaving the sensor wire up the right ureter.   ? ?A 5-French open-ended ureteral catheter and a gentle retrograde pyelogram was performed, revealing a normal caliber ureter without any filling defects. There was no hydronephrosis of the collecting system. There was a 949mfilling defect in the right lower pole corresponding to the stone. A 0.038 sensor wire was then passed up to the level of the renal pelvis and secured to the drape as a safety wire. The ureteral catheter and cystoscope were removed, leaving the safety wire in place.  ? ?A semi-rigid ureteroscope was passed alongside the wire up the distal ureter which appeared normal. A second 0.038 sensor wire was passed under direct vision and the semirigid scope was removed. An 8/10 Fr dilator and subsequently a 12/14Fr ureteral access sheath was carefully advanced up the ureter to the level of the UPJ over this wire under fluoroscopic guidance. The flexible ureteroscope was advanced into the collecting system via the access sheath. The collecting system was inspected. The calculus was identified at the right lower pole behind a partially opened calyx. Using the 200 micron holmium laser fiber, the stone was fragmented completely. A 2.2  Fr zero tip basket was used to remove the fragments under visual guidance. These were sent for chemical analysis. With the ureteroscope in the kidney, a gentle pyelogram was performed to delineate the calyceal  system and we evaluated the calyces systematically. We encountered a no further stones. The rest of the stone fragments were very tiny and these were  irrigated away gently. The calyces were re-inspected and there were no significant stone fragment residual.  ? ?We then withdrew the ureteroscope back down the ureter along with the access sheath, noting no evidence of any stones along the course of the ureter.  Prior to removing the ureteroscope, we did pass the Glidewire back up to the ureter to the renal pelvis.  Once the ureteroscope was removed, we then used the Glidewire under fluoroscopic guidance and passed up a 6-French x 24 cm double-pigtail ureteral stent up the ureter, making sure that the proximal and distal ends coiled within the kidney and bladder respectively. ? ?Note that we left a long tether string attached to the distal end of the ureteral stent and it exited the urethral meatus and was secured to the inner thigh with a tegaderm adhesive.  The cystoscope was then advanced back into the bladder under vision.  We were able to see the distal stent coiling nicely within the bladder.  The bladder was then emptied with irrigation solution.  The cystoscope was then removed.   ? ?The patient tolerated the procedure well and there was no complication. Patient was awoken from anesthesia and taken to the recovery room in stable condition. I was present and scrubbed for the entirety of the case. ? ?Plan:  Patient will be discharged home.  She will remove her stent in 3 days. ? ?Matt R. Rocket Gunderson MD ?[facility]  ?Pager: [phone] ? ?

## 2021-04-17 NOTE — H&P (Signed)
Office Visit Report     04/12/2021  ? ?-------------------------------------------------------------------------------- ?  ?Gloria Sharp  ?MRN: [id number]  ?DOB: [birth year], 76 year old Female  ?SSN:   ? PRIMARY CARE:  Gloria M. Nyra Capes, MD  ?REFERRING:    ?PROVIDER:  Rexene Alberts, M.D.  ?LOCATION:  [facility] [phone]  ?  ? ?-------------------------------------------------------------------------------- ?  ?CC/HPI: Gloria Sharp is a 76 year old female seen in consultation today for urolithiasis.  ? ?1. Urolithiasis:  ?She presented to [facility] on 03/26/2021 with acute onset of right-sided flank pain. CT A/P 03/26/2021 demonstrated 3 mm obstructing stone in the distal right ureterovesical junction with moderate right hydroureteronephrosis and extensive perinephric stranding. She was also found to have a 9 mm nonobstructing stone in the lower pole of the right kidney. She is found to be febrile 101.7. She underwent right ureteral stent placement on 03/26/2021 by me. Urine culture resulted less than 10K insignificant growth. She was discharged home on a 10-day course of Keflex.  ? ?Patient currently denies fever, chills, sweats, nausea, vomiting, abdominal or flank pain, gross hematuria or dysuria.  ? ?  ?ALLERGIES:  ?Celebrex ?Flagyl ?  ? ?MEDICATIONS: Metformin Hcl  ?Metoprolol Succinate  ?Plavix 75 mg tablet  ?Acetaminophen  ?Alendronate Sodium 35 mg tablet  ?Brimonidine Tartrate  ?Cholestyramine  ?Clobetasol Emollient  ?Fenofibrate  ?Fish Oil  ?Gabapentin  ?Lasix 20 mg tablet  ?Lisinopril-Hydrochlorothiazide  ?Magnesium  ?Ozempic  ?Pantoprazole Sodium  ?Permethrin  ?Pioglitazone Hcl  ?Pravastatin Sodium  ?Turmeric  ?Vitamin B12  ?  ? ?GU PSH: Hysterectomy ? ?  ?   ?PSH Notes: ureteral stent placement - 03/26/21  ? ?NON-GU PSH: Appendectomy ?Hernia Repair ?Remove Gallbladder ? ?  ? ?GU PMH: None  ? ?NON-GU PMH: Arthritis ?Cardiac murmur, unspecified ?Diabetes Type  2 ?GERD ?Glaucoma ?Hypertension ?Stroke/TIA ?  ? ?FAMILY HISTORY: 2 daughters - Runs in Family ?1 son - Runs in Family ?Diabetes - Runs in Family ?Heart problem - Runs in Family ?Hypertension - Runs in Family  ? ?SOCIAL HISTORY: Marital Status: Married ?Ethnicity: Not Hispanic Or Latino; Race: White ?Current Smoking Status: Patient has never smoked.  ? ?Tobacco Use Assessment Completed: Used Tobacco in last 30 days? ?Has never drank.  ?Drinks 2 caffeinated drinks per day. ?  ? ?REVIEW OF SYSTEMS:    ?GU Review Female:   Patient reports frequent urination, hard to postpone urination, burning /pain with urination, and get up at night to urinate. Patient denies leakage of urine, stream starts and stops, trouble starting your stream, have to strain to urinate, and being pregnant.  ?Gastrointestinal (Upper):   Patient denies nausea, vomiting, and indigestion/ heartburn.  ?Gastrointestinal (Lower):   Patient reports diarrhea. Patient denies constipation.  ?Constitutional:   Patient reports fatigue. Patient denies fever, night sweats, and weight loss.  ?Skin:   Patient reports itching. Patient denies skin rash/ lesion.  ?Eyes:   Patient denies blurred vision and double vision.  ?Ears/ Nose/ Throat:   Patient denies sore throat and sinus problems.  ?Hematologic/Lymphatic:   Patient reports easy bruising. Patient denies swollen glands.  ?Cardiovascular:   Patient denies leg swelling and chest pains.  ?Respiratory:   Patient denies cough and shortness of breath.  ?Endocrine:   Patient denies excessive thirst.  ?Musculoskeletal:   Patient reports back pain and joint pain.   ?Neurological:   Patient reports dizziness. Patient denies headaches.  ?Psychologic:   Patient reports anxiety. Patient denies depression.  ? ?VITAL SIGNS:    ?  04/12/2021 08:54 AM  ?Weight 218 lb / 98.88 kg  ?Height 60 in / 152.4 cm  ?BP 112/67 mmHg  ?Pulse 89 /min  ?Temperature 97.1 F / 36.1 C  ?BMI 42.6 kg/m?  ? ?MULTI-SYSTEM PHYSICAL EXAMINATION:     ?Constitutional: Well-nourished. No physical deformities. Normally developed. Good grooming.  ?Respiratory: No labored breathing, no use of accessory muscles.   ?Cardiovascular: Normal temperature, normal extremity pulses, no swelling, no varicosities.  ?Gastrointestinal: No mass, no tenderness, no rigidity, obese  ? ?  ?Complexity of Data:  ?Source Of History:  Patient, Medical Record Summary  ?Records Review:   Previous Doctor Records, Previous Hospital Records  ?Urine Test Review:   Urinalysis  ?X-Ray Review: C.T. Abdomen/Pelvis: Reviewed Films. Reviewed Report. Discussed With Patient.  ?  ? ?PROCEDURES:    ? ?     Urinalysis w/Scope ?Dipstick Dipstick Cont'd Micro  ?Color: Orange Bilirubin: Invalid mg/dL WBC/hpf: 10 - 20/hpf  ?Appearance: Cloudy Ketones: Invalid mg/dL RBC/hpf: >60/hpf  ?Specific Gravity: Invalid Blood: Invalid ery/uL Bacteria: Mod (26-50/hpf)  ?pH: Invalid Protein: Invalid mg/dL Cystals: NS (Not Seen)  ?Glucose: Invalid mg/dL Urobilinogen: Invalid mg/dL Casts: NS (Not Seen)  ?  Nitrites: Invalid Trichomonas: Not Present  ?  Leukocyte Esterase: Invalid leu/uL Mucous: Present  ?    Epithelial Cells: 0 - 5/hpf  ?    Yeast: NS (Not Seen)  ?    Sperm: Not Present  ? ? ?Notes: invalid due to color interference ?  ? ?ASSESSMENT:  ?    ICD-10 Details  ?1 GU:   Renal and ureteral calculus - N20.2   ?2   Hydronephrosis - N13.0   ? ?PLAN:    ? ?      Document ?Letter(s):  Created for Patient: Clinical Summary  ? ? ?     Notes:   1. Right ureteral and renal stone:  ?-She is s/p right ureteral stent placement on 03/26/2021 for a 3 mm right distal ureteral stone and a 9 mm nonobstructing right lower pole stone. She is already scheduled for cystoscopy, right retrograde pyelogram, right ureteroscopy with laser lithotripsy and basket traction of stone on 04/17/2021. Preoperative urine culture sent today.  ?Discussed risk and benefits. Discussed stone prevention as below we will plan to have her follow-up in 1  month after surgery for renal ultrasound.  ? ?We discussed the options for management of kidney stones, including observation, ESWL, ureteroscopy with laser lithotripsy, and PCNL. The risks and benefits of each option were discussed.  ?For observation I described the risks which include but are not limited to silent renal damage, life-threatening infection, need for emergent surgery, failure to pass stone, and pain.  ? ?ESWL: risks and benefits of ESWL were outlined including infection, bleeding, pain, steinstrasse, kidney injury, need for ancillary treatments, and global anesthesia risks including but not limited to CVA, MI, DVT, PE, pneumonia, and death.  ? ?Ureteroscopy: risks and benefits of ureteroscopy were outlined, including infection, bleeding, pain, temporary ureteral stent and associated stent bother, ureteral injury, ureteral stricture, need for ancillary treatments, and global anesthesia risks including but not limited to CVA, MI, DVT, PE, pneumonia, and death.  ? ? ?We discussed dietary methods for stone prevention including the following: increased water intake to 2-3 liters per day, add lemon or lemon concentrate to water to increase citrate which is beneficial for stone prevention, limiting dietary sodium to less than 2000 mg per day, limiting animal protein to less than 2 servings (16 ounces/day), and limiting  foods high in oxalate content (spinach, beans, chocolate, etc.).  ? ? ?CC: Maryella Shivers, MD  ? ? ?  ? ?     Next Appointment:    ?  Next Appointment: 04/17/2021 02:00 PM  ?  Appointment Type: Surgery   ?  Location: [facility] [phone]  ?  Provider: Rexene Alberts, M.D.  ?  Reason for Visit: WL/OP CYSTO, RT RPG, RT URS LL RT STENT  ?  ? ?Urology Preoperative H&P  ? ?Chief Complaint: Right ureteral and renal stone ? ?History of Present Illness: CINDA HARA is a 76 y.o. female with a right ureteral and renal stone here for ureteroscopy with laser lithotripsy. Denies  fevers, chills, dysuria. UA in office last week without suspicion for infection. ? ? ? ?Past Medical History:  ?Diagnosis Date  ? Aortic stenosis 09/04/2019  ? CVA (cerebral vascular accident) ([place]) 2015?  ? Cystocele with uterine pro

## 2021-04-17 NOTE — Anesthesia Preprocedure Evaluation (Addendum)
Anesthesia Evaluation  ?Patient identified by MRN, date of birth, ID band ?Patient awake ? ? ? ?Reviewed: ?NPO status , Patient's Chart, lab work & pertinent test results ? ?History of Anesthesia Complications ?(+) PONV, PROLONGED EMERGENCE and history of anesthetic complications ? ?Airway ?Mallampati: II ? ?TM Distance: >3 FB ?Neck ROM: Full ? ? ? Dental ?no notable dental hx. ? ?  ?Pulmonary ?neg pulmonary ROS,  ?  ?Pulmonary exam normal ? ? ? ? ? ? ? Cardiovascular ?hypertension, Pt. on medications and Pt. on home beta blockers ?+ Valvular Problems/Murmurs MR and AS  ?Rhythm:Regular Rate:Normal ?+ Systolic murmurs ? ?  ?Neuro/Psych ?CVA (2020) negative psych ROS  ? GI/Hepatic ?Neg liver ROS, GERD  Medicated,  ?Endo/Other  ?diabetes, Type 2, Oral Hypoglycemic Agents ? Renal/GU ?Renal disease  ?negative genitourinary ?  ?Musculoskeletal ? ?(+) Fibromyalgia -, narcotic dependent ? Abdominal ?Normal abdominal exam  (+)   ?Peds ? Hematology ?negative hematology ROS ?(+)   ?Anesthesia Other Findings ? ? Reproductive/Obstetrics ? ?  ? ? ? ? ? ? ? ? ? ? ? ? ? ?  ?  ? ? ? ? ? ? ?Anesthesia Physical ?Anesthesia Plan ? ?ASA: 3 ? ?Anesthesia Plan: General  ? ?Post-op Pain Management:   ? ?Induction: Intravenous ? ?PONV Risk Score and Plan: 4 or greater and Ondansetron, Dexamethasone and Treatment may vary due to age or medical condition ? ?Airway Management Planned: Mask and LMA ? ?Additional Equipment: None ? ?Intra-op Plan:  ? ?Post-operative Plan: Extubation in OR ? ?Informed Consent: I have reviewed the patients History and Physical, chart, labs and discussed the procedure including the risks, benefits and alternatives for the proposed anesthesia with the patient or authorized representative who has indicated his/her understanding and acceptance.  ? ? ? ?Dental advisory given ? ?Plan Discussed with: CRNA ? ?Anesthesia Plan Comments: (Lab Results ?     Component                Value                Date                 ?     WBC                      13.7 (H)            04/09/2021           ?     HGB                      10.4 (L)            04/09/2021           ?     HCT                      32.3 (L)            04/09/2021           ?     MCV                      89.2                04/09/2021           ?     PLT  506 (H)             04/09/2021           ?Lab Results ?     Component                Value               Date                 ?     NA                       142                 04/09/2021           ?     K                        4.3                 04/09/2021           ?     CO2                      24                  04/09/2021           ?     GLUCOSE                  158 (H)             04/09/2021           ?     BUN                      21                  04/09/2021           ?     CREATININE               1.05 (H)            04/09/2021           ?     CALCIUM                  9.1                 04/09/2021           ?     EGFR                     49 (L)              04/04/2020           ?     GFRNONAA                 56 (L)              04/09/2021           ?Perfusion 02/23: ??  The study is normal. The study is low risk. ??  Left ventricular function is normal. Nuclear stress EF: 70 %. The left ventricular ejection fraction is hyperdynamic (>65%). ??  Prior study available for comparison from 01/21/2019. ? ?ECHO 09/22: ??1. Left ventricular ejection fraction, by estimation,  is 60 to 65%. The  ?left ventricle has normal function. The left ventricle has no regional  ?wall motion abnormalities. There is moderate concentric left ventricular  ?hypertrophy. Left ventricular  ?diastolic parameters are consistent with Grade II diastolic dysfunction  ?(pseudonormalization). The average left ventricular global longitudinal  ?strain is 11.5 %. The global longitudinal strain is abnormal.  ??2. Right ventricular systolic function is normal. The right ventricular  ?size is normal.  There is mildly elevated pulmonary artery systolic  ?pressure.  ??3. The mitral valve is normal in structure. Mild to moderate mitral valve  ?regurgitation. Mild mitral stenosis. The mean mitral valve gradient is 4.0  ?mmHg.  ??4. Tricuspid valve regurgitation is mild to moderate.  ??5. The aortic valve is calcified. Aortic valve regurgitation is not  ?visualized. Moderate aortic valve stenosis. Aortic valve area, by VTI  ?measures 1.09 cm?Marland Kitchen Aortic valve mean gradient measures 17.0 mmHg. Aortic  ?valve Vmax measures 2.67 m/s.  ??6. 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  ??7. The inferior vena cava is normal in size with greater than 50%  ?respiratory variability, suggesting right atrial pressure of 3 mmHg. )  ? ? ? ? ? ? ?Anesthesia Quick Evaluation ? ?

## 2021-04-17 NOTE — Discharge Instructions (Addendum)
[facility] ?[phone] ?Post Ureteroscopy With or Without Stent Instructions ? ?Definitions: ? ?Ureter: The duct that transports urine from the kidney to the bladder. ?Stent:   A plastic hollow tube that is placed into the ureter, from the kidney to the bladder to prevent the ureter from swelling shut. ? ?GENERAL INSTRUCTIONS: ? ?Despite the fact that no skin incisions were used, the area around the ureter and bladder is raw and irritated. The stent is a foreign body which will further irritate the bladder wall. This irritation is manifested by increased frequency of urination, both day and night, and by an increase in the urge to urinate. In some, the urge to urinate is present almost always. Sometimes the urge is strong enough that you may not be able to stop yourself from urinating. The only real cure is to remove the stent and then give time for the bladder wall to heal which can't be done until the danger of the ureter swelling shut has passed, which varies. ? ?You may see some blood in your urine while the stent is in place and a few days afterwards. Do not be alarmed, even if the urine was clear for a while. Get off your feet and drink lots of fluids until clearing occurs. If you start to pass clots or don't improve, call us. ? ?DIET: ?You may return to your normal diet immediately. Because of the raw surface of your bladder, alcohol, spicy foods, acid type foods and drinks with caffeine may cause irritation or frequency and should be used in moderation. To keep your urine flowing freely and to avoid constipation, drink plenty of fluids during the day ( 8-10 glasses ). ?Tip: Avoid cranberry juice because it is very acidic. ? ?ACTIVITY: ?Your physical activity doesn't need to be restricted. However, if you are very active, you may see some blood in your urine. We suggest that you reduce your activity under these circumstances until the bleeding has stopped. ? ?BOWELS: ?It is important to  keep your bowels regular during the postoperative period. Straining with bowel movements can cause bleeding. A bowel movement every other day is reasonable. Use a mild laxative if needed, such as Milk of Magnesia 2-3 tablespoons, or 2 Dulcolax tablets. Call if you continue to have problems. If you have been taking narcotics for pain, before, during or after your surgery, you may be constipated. Take a laxative if necessary. ? ? ?MEDICATION: ?You should resume your pre-surgery medications unless told not to. In addition you will often be given an antibiotic to prevent infection. These should be taken as prescribed until the bottles are finished unless you are having an unusual reaction to one of the drugs. ? ?PROBLEMS YOU SHOULD REPORT TO [place]: ?Fevers over 100.5 Fahrenheit. ?Heavy bleeding, or clots ( See above notes about blood in urine ). ?Inability to urinate. ?Drug reactions ( hives, rash, nausea, vomiting, diarrhea ). ?Severe burning or pain with urination that is not improving. ? ?FOLLOW-UP: ?You will need a follow-up appointment to monitor your progress. Call for this appointment at the number listed above. Usually the first appointment will be about three to fourteen days after your surgery. ? ?You have a right ureteral stent in place. Remove stent by pulling on attached string on Thursday AM. ? ?

## 2021-04-17 NOTE — Transfer of Care (Signed)
Immediate Anesthesia Transfer of Care Note ? ?Patient: Gloria Sharp ? ?Procedure(s) Performed: CYSTOSCOPY/ RETROGRADE/URETEROSCOPY/HOLMIUM LASER/STENT PLACEMENT (Right: Ureter) ? ?Patient Location: PACU ? ?Anesthesia Type:General ? ?Level of Consciousness: awake, drowsy and patient cooperative ? ?Airway & Oxygen Therapy: Patient Spontanous Breathing and Patient connected to face mask oxygen ? ?Post-op Assessment: Report given to RN and Post -op Vital signs reviewed and stable ? ?Post vital signs: Reviewed and stable ? ?Last Vitals:  ?Vitals Value Taken Time  ?BP 162/84 04/17/21 1522  ?Temp    ?Pulse 107 04/17/21 1524  ?Resp 20 04/17/21 1524  ?SpO2 100 % 04/17/21 1524  ?Vitals shown include unvalidated device data. ? ?Last Pain:  ?Vitals:  ? 04/17/21 1229  ?TempSrc:   ?PainSc: 1   ?   ? ?Patients Stated Pain Goal: 4 (04/17/21 1229) ? ?Complications: No notable events documented. ?

## 2021-04-18 ENCOUNTER — Encounter (HOSPITAL_COMMUNITY): Payer: Self-pay | Admitting: Urology

## 2021-04-18 LAB — GLUCOSE, CAPILLARY: Glucose-Capillary: 106 mg/dL — ABNORMAL HIGH (ref 70–99)

## 2021-04-18 NOTE — Anesthesia Postprocedure Evaluation (Signed)
Anesthesia Post Note ? ?Patient: Gloria Sharp ? ?Procedure(s) Performed: CYSTOSCOPY/ RETROGRADE/URETEROSCOPY/HOLMIUM LASER/STENT PLACEMENT (Right: Ureter) ? ?  ? ?Patient location during evaluation: PACU ?Anesthesia Type: General ?Level of consciousness: awake and alert ?Pain management: pain level controlled ?Vital Signs Assessment: post-procedure vital signs reviewed and stable ?Respiratory status: spontaneous breathing, nonlabored ventilation, respiratory function stable and patient connected to nasal cannula oxygen ?Cardiovascular status: blood pressure returned to baseline and stable ?Postop Assessment: no apparent nausea or vomiting ?Anesthetic complications: no ? ? ?No notable events documented. ? ?Last Vitals:  ?Vitals:  ? 04/17/21 1600 04/17/21 1620  ?BP: (!) 182/73 (!) 178/94  ?Pulse: 90 95  ?Resp: 17 16  ?Temp: 36.6 ?C 36.7 ?C  ?SpO2: 95% 95%  ?  ?Last Pain:  ?Vitals:  ? 04/17/21 1620  ?TempSrc:   ?PainSc: 0-No pain  ? ? ?  ?  ?  ?  ?  ?  ? ?March Rummage Gloria Sharp ? ? ? ? ?

## 2021-04-20 DIAGNOSIS — R319 Hematuria, unspecified: Secondary | ICD-10-CM | POA: Diagnosis not present

## 2021-04-20 DIAGNOSIS — Z6841 Body Mass Index (BMI) 40.0 and over, adult: Secondary | ICD-10-CM | POA: Diagnosis not present

## 2021-04-20 DIAGNOSIS — R0602 Shortness of breath: Secondary | ICD-10-CM | POA: Diagnosis not present

## 2021-04-20 DIAGNOSIS — R42 Dizziness and giddiness: Secondary | ICD-10-CM | POA: Diagnosis not present

## 2021-04-24 DIAGNOSIS — H8110 Benign paroxysmal vertigo, unspecified ear: Secondary | ICD-10-CM | POA: Diagnosis not present

## 2021-04-24 DIAGNOSIS — E1159 Type 2 diabetes mellitus with other circulatory complications: Secondary | ICD-10-CM | POA: Diagnosis not present

## 2021-04-24 DIAGNOSIS — R7989 Other specified abnormal findings of blood chemistry: Secondary | ICD-10-CM | POA: Diagnosis not present

## 2021-04-24 DIAGNOSIS — I152 Hypertension secondary to endocrine disorders: Secondary | ICD-10-CM | POA: Diagnosis not present

## 2021-04-26 DIAGNOSIS — R7989 Other specified abnormal findings of blood chemistry: Secondary | ICD-10-CM | POA: Diagnosis not present

## 2021-04-26 DIAGNOSIS — R0602 Shortness of breath: Secondary | ICD-10-CM | POA: Diagnosis not present

## 2021-05-15 DIAGNOSIS — E782 Mixed hyperlipidemia: Secondary | ICD-10-CM | POA: Diagnosis not present

## 2021-05-15 DIAGNOSIS — E1159 Type 2 diabetes mellitus with other circulatory complications: Secondary | ICD-10-CM | POA: Diagnosis not present

## 2021-05-17 DIAGNOSIS — R350 Frequency of micturition: Secondary | ICD-10-CM | POA: Diagnosis not present

## 2021-05-17 DIAGNOSIS — N130 Hydronephrosis with ureteropelvic junction obstruction: Secondary | ICD-10-CM | POA: Diagnosis not present

## 2021-05-17 DIAGNOSIS — R3915 Urgency of urination: Secondary | ICD-10-CM | POA: Diagnosis not present

## 2021-05-17 DIAGNOSIS — N202 Calculus of kidney with calculus of ureter: Secondary | ICD-10-CM | POA: Diagnosis not present

## 2021-05-24 ENCOUNTER — Other Ambulatory Visit: Payer: Self-pay | Admitting: Cardiology

## 2021-05-25 DIAGNOSIS — Z6841 Body Mass Index (BMI) 40.0 and over, adult: Secondary | ICD-10-CM | POA: Diagnosis not present

## 2021-05-25 DIAGNOSIS — R600 Localized edema: Secondary | ICD-10-CM | POA: Diagnosis not present

## 2021-06-01 DIAGNOSIS — Z6841 Body Mass Index (BMI) 40.0 and over, adult: Secondary | ICD-10-CM | POA: Diagnosis not present

## 2021-06-01 DIAGNOSIS — M797 Fibromyalgia: Secondary | ICD-10-CM | POA: Diagnosis not present

## 2021-06-01 DIAGNOSIS — E1140 Type 2 diabetes mellitus with diabetic neuropathy, unspecified: Secondary | ICD-10-CM | POA: Diagnosis not present

## 2021-06-01 DIAGNOSIS — R600 Localized edema: Secondary | ICD-10-CM | POA: Diagnosis not present

## 2021-06-05 ENCOUNTER — Other Ambulatory Visit (HOSPITAL_COMMUNITY): Payer: Self-pay

## 2021-06-05 ENCOUNTER — Emergency Department (HOSPITAL_COMMUNITY): Payer: Medicare HMO

## 2021-06-05 ENCOUNTER — Other Ambulatory Visit: Payer: Self-pay

## 2021-06-05 ENCOUNTER — Emergency Department (HOSPITAL_COMMUNITY)
Admission: EM | Admit: 2021-06-05 | Discharge: 2021-06-05 | Disposition: A | Discharge status: Home / Self Care | Payer: Medicare HMO | Attending: Emergency Medicine | Admitting: Emergency Medicine

## 2021-06-05 DIAGNOSIS — S3992XA Unspecified injury of lower back, initial encounter: Secondary | ICD-10-CM | POA: Insufficient documentation

## 2021-06-05 DIAGNOSIS — Z7984 Long term (current) use of oral hypoglycemic drugs: Secondary | ICD-10-CM | POA: Insufficient documentation

## 2021-06-05 DIAGNOSIS — I10 Essential (primary) hypertension: Secondary | ICD-10-CM | POA: Diagnosis not present

## 2021-06-05 DIAGNOSIS — M25512 Pain in left shoulder: Secondary | ICD-10-CM | POA: Diagnosis not present

## 2021-06-05 DIAGNOSIS — Z79899 Other long term (current) drug therapy: Secondary | ICD-10-CM | POA: Diagnosis not present

## 2021-06-05 DIAGNOSIS — M4312 Spondylolisthesis, cervical region: Secondary | ICD-10-CM | POA: Diagnosis not present

## 2021-06-05 DIAGNOSIS — Z7902 Long term (current) use of antithrombotics/antiplatelets: Secondary | ICD-10-CM | POA: Insufficient documentation

## 2021-06-05 DIAGNOSIS — S199XXA Unspecified injury of neck, initial encounter: Secondary | ICD-10-CM | POA: Insufficient documentation

## 2021-06-05 DIAGNOSIS — Y92410 Unspecified street and highway as the place of occurrence of the external cause: Secondary | ICD-10-CM | POA: Diagnosis not present

## 2021-06-05 DIAGNOSIS — S0990XA Unspecified injury of head, initial encounter: Secondary | ICD-10-CM | POA: Insufficient documentation

## 2021-06-05 DIAGNOSIS — R110 Nausea: Secondary | ICD-10-CM | POA: Diagnosis not present

## 2021-06-05 DIAGNOSIS — M542 Cervicalgia: Secondary | ICD-10-CM | POA: Diagnosis not present

## 2021-06-05 DIAGNOSIS — Z041 Encounter for examination and observation following transport accident: Secondary | ICD-10-CM | POA: Diagnosis not present

## 2021-06-05 MED ORDER — ACETAMINOPHEN 500 MG PO TABS
1000.0000 mg | ORAL_TABLET | Freq: Once | ORAL | Status: AC
  Administered 2021-06-05: 1000 mg via ORAL
  Filled 2021-06-05: qty 2
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
## 2021-06-05 NOTE — ED Notes (Signed)
Patient transported to CT 

## 2021-06-05 NOTE — Discharge Instructions (Signed)
Return for any problem.  ?

## 2021-06-15 DIAGNOSIS — E782 Mixed hyperlipidemia: Secondary | ICD-10-CM | POA: Diagnosis not present

## 2021-06-15 DIAGNOSIS — E1159 Type 2 diabetes mellitus with other circulatory complications: Secondary | ICD-10-CM | POA: Diagnosis not present

## 2021-06-16 DIAGNOSIS — E782 Mixed hyperlipidemia: Secondary | ICD-10-CM | POA: Diagnosis not present

## 2021-06-16 DIAGNOSIS — E1159 Type 2 diabetes mellitus with other circulatory complications: Secondary | ICD-10-CM | POA: Diagnosis not present

## 2021-06-23 DIAGNOSIS — E1129 Type 2 diabetes mellitus with other diabetic kidney complication: Secondary | ICD-10-CM | POA: Diagnosis not present

## 2021-06-23 DIAGNOSIS — E782 Mixed hyperlipidemia: Secondary | ICD-10-CM | POA: Diagnosis not present

## 2021-06-23 DIAGNOSIS — E1159 Type 2 diabetes mellitus with other circulatory complications: Secondary | ICD-10-CM | POA: Diagnosis not present

## 2021-06-23 DIAGNOSIS — N1831 Chronic kidney disease, stage 3a: Secondary | ICD-10-CM | POA: Diagnosis not present

## 2021-06-27 ENCOUNTER — Other Ambulatory Visit: Payer: Self-pay | Admitting: Family Medicine

## 2021-06-27 DIAGNOSIS — Z1231 Encounter for screening mammogram for malignant neoplasm of breast: Secondary | ICD-10-CM

## 2021-07-03 ENCOUNTER — Ambulatory Visit
Admission: RE | Admit: 2021-07-03 | Discharge: 2021-07-03 | Disposition: A | Discharge status: Home / Self Care | Payer: Medicare HMO | Source: Ambulatory Visit | Attending: Family Medicine | Admitting: Family Medicine

## 2021-07-03 DIAGNOSIS — Z1231 Encounter for screening mammogram for malignant neoplasm of breast: Secondary | ICD-10-CM

## 2021-07-05 DIAGNOSIS — H401131 Primary open-angle glaucoma, bilateral, mild stage: Secondary | ICD-10-CM | POA: Diagnosis not present

## 2021-07-14 ENCOUNTER — Ambulatory Visit: Payer: Medicare HMO | Admitting: Cardiology

## 2021-07-15 DIAGNOSIS — E782 Mixed hyperlipidemia: Secondary | ICD-10-CM | POA: Diagnosis not present

## 2021-08-10 DIAGNOSIS — Z1331 Encounter for screening for depression: Secondary | ICD-10-CM | POA: Diagnosis not present

## 2021-08-10 DIAGNOSIS — Z136 Encounter for screening for cardiovascular disorders: Secondary | ICD-10-CM | POA: Diagnosis not present

## 2021-08-10 DIAGNOSIS — Z139 Encounter for screening, unspecified: Secondary | ICD-10-CM | POA: Diagnosis not present

## 2021-08-10 DIAGNOSIS — Z0000 Encounter for general adult medical examination without abnormal findings: Secondary | ICD-10-CM | POA: Diagnosis not present

## 2021-08-10 DIAGNOSIS — Z1339 Encounter for screening examination for other mental health and behavioral disorders: Secondary | ICD-10-CM | POA: Diagnosis not present

## 2021-08-10 DIAGNOSIS — Z6841 Body Mass Index (BMI) 40.0 and over, adult: Secondary | ICD-10-CM | POA: Diagnosis not present

## 2021-08-15 DIAGNOSIS — E782 Mixed hyperlipidemia: Secondary | ICD-10-CM | POA: Diagnosis not present

## 2021-09-13 ENCOUNTER — Other Ambulatory Visit: Payer: Self-pay | Admitting: Cardiology

## 2021-09-13 DIAGNOSIS — M5416 Radiculopathy, lumbar region: Secondary | ICD-10-CM | POA: Diagnosis not present

## 2021-09-13 DIAGNOSIS — Z1231 Encounter for screening mammogram for malignant neoplasm of breast: Secondary | ICD-10-CM | POA: Diagnosis not present

## 2021-09-13 DIAGNOSIS — Z6839 Body mass index (BMI) 39.0-39.9, adult: Secondary | ICD-10-CM | POA: Diagnosis not present

## 2021-09-13 NOTE — Telephone Encounter (Signed)
Rx refill sent to pharmacy. 

## 2021-09-14 ENCOUNTER — Ambulatory Visit: Payer: Medicare HMO | Attending: Cardiology | Admitting: Cardiology

## 2021-09-15 DIAGNOSIS — E782 Mixed hyperlipidemia: Secondary | ICD-10-CM | POA: Diagnosis not present

## 2021-10-13 DIAGNOSIS — M545 Low back pain, unspecified: Secondary | ICD-10-CM | POA: Diagnosis not present

## 2021-10-13 DIAGNOSIS — G8929 Other chronic pain: Secondary | ICD-10-CM | POA: Diagnosis not present

## 2021-10-13 DIAGNOSIS — M549 Dorsalgia, unspecified: Secondary | ICD-10-CM | POA: Diagnosis not present

## 2021-10-13 DIAGNOSIS — Z6839 Body mass index (BMI) 39.0-39.9, adult: Secondary | ICD-10-CM | POA: Diagnosis not present

## 2021-10-15 DIAGNOSIS — E782 Mixed hyperlipidemia: Secondary | ICD-10-CM | POA: Diagnosis not present

## 2021-10-25 DIAGNOSIS — E782 Mixed hyperlipidemia: Secondary | ICD-10-CM | POA: Diagnosis not present

## 2021-10-25 DIAGNOSIS — E1159 Type 2 diabetes mellitus with other circulatory complications: Secondary | ICD-10-CM | POA: Diagnosis not present

## 2021-10-25 DIAGNOSIS — E1129 Type 2 diabetes mellitus with other diabetic kidney complication: Secondary | ICD-10-CM | POA: Diagnosis not present

## 2021-10-27 DIAGNOSIS — Z6839 Body mass index (BMI) 39.0-39.9, adult: Secondary | ICD-10-CM | POA: Diagnosis not present

## 2021-10-27 DIAGNOSIS — M5416 Radiculopathy, lumbar region: Secondary | ICD-10-CM | POA: Diagnosis not present

## 2021-11-01 DIAGNOSIS — E1121 Type 2 diabetes mellitus with diabetic nephropathy: Secondary | ICD-10-CM | POA: Diagnosis not present

## 2021-11-01 DIAGNOSIS — D500 Iron deficiency anemia secondary to blood loss (chronic): Secondary | ICD-10-CM | POA: Diagnosis not present

## 2021-11-01 DIAGNOSIS — Z6839 Body mass index (BMI) 39.0-39.9, adult: Secondary | ICD-10-CM | POA: Diagnosis not present

## 2021-11-01 DIAGNOSIS — E782 Mixed hyperlipidemia: Secondary | ICD-10-CM | POA: Diagnosis not present

## 2021-11-01 DIAGNOSIS — E1129 Type 2 diabetes mellitus with other diabetic kidney complication: Secondary | ICD-10-CM | POA: Diagnosis not present

## 2021-11-01 DIAGNOSIS — N1831 Chronic kidney disease, stage 3a: Secondary | ICD-10-CM | POA: Diagnosis not present

## 2021-11-14 DIAGNOSIS — M25551 Pain in right hip: Secondary | ICD-10-CM | POA: Diagnosis not present

## 2021-11-14 DIAGNOSIS — Z6841 Body Mass Index (BMI) 40.0 and over, adult: Secondary | ICD-10-CM | POA: Diagnosis not present

## 2021-11-14 DIAGNOSIS — M5416 Radiculopathy, lumbar region: Secondary | ICD-10-CM | POA: Diagnosis not present

## 2021-11-15 ENCOUNTER — Encounter: Payer: Self-pay | Admitting: Cardiology

## 2021-11-15 ENCOUNTER — Ambulatory Visit: Payer: Medicare HMO | Attending: Cardiology | Admitting: Cardiology

## 2021-11-15 VITALS — BP 132/70 | HR 64 | Ht 60.0 in | Wt 212.0 lb

## 2021-11-15 DIAGNOSIS — E782 Mixed hyperlipidemia: Secondary | ICD-10-CM | POA: Diagnosis not present

## 2021-11-15 DIAGNOSIS — I10 Essential (primary) hypertension: Secondary | ICD-10-CM

## 2021-11-15 DIAGNOSIS — Z8673 Personal history of transient ischemic attack (TIA), and cerebral infarction without residual deficits: Secondary | ICD-10-CM

## 2021-11-15 DIAGNOSIS — I4729 Other ventricular tachycardia: Secondary | ICD-10-CM

## 2021-11-15 DIAGNOSIS — E785 Hyperlipidemia, unspecified: Secondary | ICD-10-CM | POA: Diagnosis not present

## 2021-11-15 DIAGNOSIS — I350 Nonrheumatic aortic (valve) stenosis: Secondary | ICD-10-CM

## 2021-11-15 NOTE — Addendum Note (Signed)
Addended by: Jacobo Forest D on: 11/15/2021 01:48 PM   Modules accepted: Orders

## 2021-11-15 NOTE — Progress Notes (Signed)
Cardiology Office Note:    Date:  11/15/2021   ID:  Gloria Sharp, DOB [birth year], MRN [id number]  PCP:  Marco Collie, MD  Cardiologist:  Jenne Campus, MD    Referring MD: Marco Collie, MD   Chief Complaint  Patient presents with   Follow-up  Doing fine but have appoint with a hip  History of Present Illness:    Gloria Sharp is a 76 y.o. female with past medical history significant for aortic stenosis which was moderate last assessed in September 2022, essential hypertension, nonsustained ventricular tachycardia which is asymptomatic, diabetes, stress test done last year was negative.  She is in my office today for follow-up.  Overall doing well.  Last time I seen her was before surgery which was elective.  She did well with the surgery with no difficulties.  It was surgery for hernia however now she had problem with the hip and she is contemplating about surgery for her hip.  Her ability to exercise is limited because of orthopedic issues, she denies have any chest pain tightness squeezing pressure burning chest no dizziness no passing out no palpitations  Past Medical History:  Diagnosis Date   Aortic stenosis 09/04/2019   CVA (cerebral vascular accident) [facility]) 2015?   Cystocele with uterine prolapse 01/17/2018   Diabetes ([place])    Dyslipidemia 10/10/2018   Essential hypertension 10/10/2018   Fibromyalgia    GERD (gastroesophageal reflux disease)    Heart murmur 10/10/2018   History of CVA (cerebrovascular accident) 10/10/2018   Hypercholesterolemia    Hypertension    Leukocytosis    Mitral regurgitation 11/24/2018   Nonsustained ventricular tachycardia ([place]) 11/24/2018   Obesity    Post-operative nausea and vomiting    "hard to wake up"   Rectocele 01/17/2018   Type 2 diabetes mellitus without complication, without long-term current use of insulin ([place]) 10/10/2018    Past Surgical History:  Procedure Laterality Date   ABDOMINAL HYSTERECTOMY     partial, left the ovaries    APPENDECTOMY     BREAST CYST ASPIRATION Right    COLONOSCOPY  11/11/2013   Colonioc polyps status post polypectomy. Pancolonic diverticulosis predominately in the sigmoid colon   CYSTOSCOPY W/ URETERAL STENT PLACEMENT Right 03/26/2021   Procedure: CYSTOSCOPY WITH RETROGRADE PYELOGRAM/URETERAL STENT PLACEMENT;  Surgeon: Janith Lima, MD;  Location: [facility];  Service: Urology;  Laterality: Right;   CYSTOSCOPY/URETEROSCOPY/HOLMIUM LASER/STENT PLACEMENT Right 04/17/2021   Procedure: CYSTOSCOPY/ RETROGRADE/URETEROSCOPY/HOLMIUM LASER/STENT PLACEMENT;  Surgeon: Janith Lima, MD;  Location: [facility];  Service: Urology;  Laterality: Right;  ONLY NEEDS 60 MIN   DILATION AND CURETTAGE OF UTERUS     GALLBLADDER SURGERY Right 07/2014   HERNIA REPAIR  [id number]/7672   Umbilical   vaginal polyp removal      Current Medications: Current Meds  Medication Sig   acetaminophen (TYLENOL) 650 MG CR tablet Take 650 mg by mouth daily as needed for pain.   alendronate (FOSAMAX) 35 MG tablet Take 35 mg by mouth every 7 (seven) days.   brimonidine (ALPHAGAN) 0.15 % ophthalmic solution Place 1 drop into both eyes 2 (two) times daily.   cholestyramine (QUESTRAN) 4 g packet Take 1 packet (4 g total) by mouth daily. Take at least 2 hours before or after rest of the medications (Patient taking differently: Take 4 g by mouth See admin instructions. Every 3 days)   clobetasol cream (TEMOVATE) 0.94 % Apply 1 application. topically daily as needed (itching).   clopidogrel (PLAVIX) 75 MG  tablet Take 75 mg by mouth daily.   diclofenac Sodium (VOLTAREN) 1 % GEL Apply 2-4 g topically at bedtime as needed (knee pain).   docusate sodium (COLACE) 100 MG capsule Take 1 capsule (100 mg total) by mouth daily as needed for up to 40 doses. (Patient taking differently: Take 100 mg by mouth daily as needed for mild constipation or moderate constipation.)   fenofibrate (TRICOR) 145 MG tablet Take 145 mg by mouth daily.   ferrous sulfate 325  (65 FE) MG EC tablet Take 325 mg by mouth daily with lunch.   furosemide (LASIX) 20 MG tablet TAKE ONE TABLET BY MOUTH EVERY DAY (Patient taking differently: Take 20 mg by mouth daily.)   gabapentin (NEURONTIN) 300 MG capsule Take 300 mg by mouth 2 (two) times daily.   lisinopril (ZESTRIL) 20 MG tablet Take 1 tablet (20 mg total) by mouth daily.   MAGNESIUM PO Take 500 mg by mouth daily.   metFORMIN (GLUCOPHAGE) 1000 MG tablet Take 1,000 mg by mouth 2 (two) times daily with a meal.   metoprolol succinate (TOPROL-XL) 100 MG 24 hr tablet Take 1 tablet (100 mg total) by mouth daily.   Omega-3 Fatty Acids (FISH OIL PO) Take 2,000 mg by mouth daily.   OVER THE COUNTER MEDICATION Apply 1-4 patches topically daily as needed (back pain).   oxyCODONE (ROXICODONE) 5 MG immediate release tablet Take 1 tablet (5 mg total) by mouth every 4 (four) hours as needed for up to 15 doses for severe pain.   pantoprazole (PROTONIX) 40 MG tablet Take 40 mg by mouth daily as needed (acid reflux).   pioglitazone (ACTOS) 15 MG tablet Take 15 mg by mouth daily.   pravastatin (PRAVACHOL) 20 MG tablet Take 20 mg by mouth once a week.   Semaglutide, 1 MG/DOSE, (OZEMPIC, 1 MG/DOSE,) 2 MG/1.5ML SOPN Inject 1 mg into the skin once a week.   TURMERIC PO Take 1 tablet by mouth daily.   vitamin B-12 (CYANOCOBALAMIN) 1000 MCG tablet Take 1,000 mcg by mouth daily.   [DISCONTINUED] ondansetron (ZOFRAN) 4 MG tablet Take 1 tablet (4 mg total) by mouth every 8 (eight) hours as needed for nausea or vomiting.     Allergies:   Celecoxib, Nabumetone, Ciprofloxacin, and Flagyl [metronidazole]   Social History   Socioeconomic History   Marital status: Married    Spouse name: Not on file   Number of children: Not on file   Years of education: Not on file   Highest education level: Not on file  Occupational History   Not on file  Tobacco Use   Smoking status: Never   Smokeless tobacco: Never  Vaping Use   Vaping Use: Never used   Substance and Sexual Activity   Alcohol use: Not Currently   Drug use: Never   Sexual activity: Not on file  Other Topics Concern   Not on file  Social History Narrative   Not on file   Social Determinants of Health   Financial Resource Strain: Not on file  Food Insecurity: Not on file  Transportation Needs: Not on file  Physical Activity: Not on file  Stress: Not on file  Social Connections: Not on file     Family History: The patient's family history includes Cancer in her mother; Diabetes in her mother; Heart disease in her father; Heart failure in her mother; Hypertension in her father and mother; Stroke in her mother. There is no history of Colon cancer, Esophageal cancer, Stomach cancer, Rectal  cancer, or Colon polyps. ROS:   Please see the history of present illness.    All 14 point review of systems negative except as described per history of present illness  EKGs/Labs/Other Studies Reviewed:      Recent Labs: 03/27/2021: ALT 14 03/29/2021: Magnesium 1.2 04/09/2021: BUN 21; Creatinine, Ser 1.05; Hemoglobin 10.4; Platelets 506; Potassium 4.3; Sodium 142  Recent Lipid Panel No results found for: "CHOL", "TRIG", "HDL", "CHOLHDL", "VLDL", "LDLCALC", "LDLDIRECT"  Physical Exam:    VS:  BP 132/70 (BP Location: Left Arm, Patient Position: Sitting)   Pulse 64   Ht 5' (1.524 m)   Wt 212 lb (96.2 kg)   SpO2 98%   BMI 41.40 kg/m     Wt Readings from Last 3 Encounters:  11/15/21 212 lb (96.2 kg)  04/17/21 214 lb (97.1 kg)  04/09/21 219 lb (99.3 kg)     GEN:  Well nourished, well developed in no acute distress HEENT: Normal NECK: No JVD; No carotid bruits LYMPHATICS: No lymphadenopathy CARDIAC: RRR, systolic ejection murmur grade 2/6 to 3/6 pressure right upper portion of the sternum, S2 is still present, no rubs, no gallops RESPIRATORY:  Clear to auscultation without rales, wheezing or rhonchi  ABDOMEN: Soft, non-tender, non-distended MUSCULOSKELETAL:  No edema;  No deformity  SKIN: Warm and dry LOWER EXTREMITIES: no swelling NEUROLOGIC:  Alert and oriented x 3 PSYCHIATRIC:  Normal affect   ASSESSMENT:    1. Nonrheumatic aortic valve stenosis   2. Essential hypertension   3. Nonsustained ventricular tachycardia ([place])   4. Dyslipidemia   5. History of CVA (cerebrovascular accident)    PLAN:    In order of problems listed above:  Nonrheumatic aortic valve stenosis.  We will repeat echocardiogram to check the degree of stenosis on the physical exam I do not think it severe however she talk about potential doing hip surgery we must know if we dealing with severe arctic stenosis. Essential hypertension blood pressure is well controlled continue present management. Dyslipidemia I did review her K PN which show me her LDL of 56 HDL 36 this is from October 2023. History of CVA: Stable from that point review no new issues.   Medication Adjustments/Labs and Tests Ordered: Current medicines are reviewed at length with the patient today.  Concerns regarding medicines are outlined above.  No orders of the defined types were placed in this encounter.  Medication changes: No orders of the defined types were placed in this encounter.   Signed, Park Liter, MD, [facility] 11/15/2021 1:38 PM    [facility]

## 2021-11-15 NOTE — Addendum Note (Signed)
Addended by: Darrel Reach on: 11/15/2021 02:08 PM   Modules accepted: Orders

## 2021-11-15 NOTE — Patient Instructions (Addendum)

## 2021-12-01 ENCOUNTER — Ambulatory Visit: Payer: Medicare HMO | Attending: Cardiology

## 2021-12-01 DIAGNOSIS — I4729 Other ventricular tachycardia: Secondary | ICD-10-CM

## 2021-12-01 DIAGNOSIS — I350 Nonrheumatic aortic (valve) stenosis: Secondary | ICD-10-CM

## 2021-12-01 LAB — ECHOCARDIOGRAM COMPLETE
AR max vel: 1.15 cm2
AV Area VTI: 1.44 cm2
AV Area mean vel: 1.12 cm2
AV Mean grad: 13.5 mmHg
AV Peak grad: 23.7 mmHg
Ao pk vel: 2.44 m/s
Area-P 1/2: 2.93 cm2
MV M vel: 5.36 m/s
MV Peak grad: 114.9 mmHg
MV VTI: 1.78 cm2
S' Lateral: 2.8 cm

## 2021-12-15 DIAGNOSIS — E782 Mixed hyperlipidemia: Secondary | ICD-10-CM | POA: Diagnosis not present

## 2021-12-15 DIAGNOSIS — E1159 Type 2 diabetes mellitus with other circulatory complications: Secondary | ICD-10-CM | POA: Diagnosis not present

## 2021-12-15 DIAGNOSIS — M5416 Radiculopathy, lumbar region: Secondary | ICD-10-CM | POA: Diagnosis not present

## 2021-12-15 DIAGNOSIS — M47816 Spondylosis without myelopathy or radiculopathy, lumbar region: Secondary | ICD-10-CM | POA: Diagnosis not present

## 2022-01-11 DIAGNOSIS — M4727 Other spondylosis with radiculopathy, lumbosacral region: Secondary | ICD-10-CM | POA: Diagnosis not present

## 2022-01-11 DIAGNOSIS — M5116 Intervertebral disc disorders with radiculopathy, lumbar region: Secondary | ICD-10-CM | POA: Diagnosis not present

## 2022-01-11 DIAGNOSIS — M5117 Intervertebral disc disorders with radiculopathy, lumbosacral region: Secondary | ICD-10-CM | POA: Diagnosis not present

## 2022-01-11 DIAGNOSIS — M4726 Other spondylosis with radiculopathy, lumbar region: Secondary | ICD-10-CM | POA: Diagnosis not present

## 2022-01-11 DIAGNOSIS — M48061 Spinal stenosis, lumbar region without neurogenic claudication: Secondary | ICD-10-CM | POA: Diagnosis not present

## 2022-01-11 DIAGNOSIS — M4316 Spondylolisthesis, lumbar region: Secondary | ICD-10-CM | POA: Diagnosis not present

## 2022-01-11 DIAGNOSIS — M4807 Spinal stenosis, lumbosacral region: Secondary | ICD-10-CM | POA: Diagnosis not present

## 2022-01-11 DIAGNOSIS — M4186 Other forms of scoliosis, lumbar region: Secondary | ICD-10-CM | POA: Diagnosis not present

## 2022-01-15 DIAGNOSIS — E1159 Type 2 diabetes mellitus with other circulatory complications: Secondary | ICD-10-CM | POA: Diagnosis not present

## 2022-01-15 DIAGNOSIS — E782 Mixed hyperlipidemia: Secondary | ICD-10-CM | POA: Diagnosis not present

## 2022-02-12 DIAGNOSIS — I350 Nonrheumatic aortic (valve) stenosis: Secondary | ICD-10-CM | POA: Diagnosis not present

## 2022-02-12 DIAGNOSIS — G8929 Other chronic pain: Secondary | ICD-10-CM

## 2022-02-12 DIAGNOSIS — M545 Low back pain, unspecified: Secondary | ICD-10-CM | POA: Diagnosis not present

## 2022-02-12 DIAGNOSIS — M461 Sacroiliitis, not elsewhere classified: Secondary | ICD-10-CM

## 2022-02-12 DIAGNOSIS — M5416 Radiculopathy, lumbar region: Secondary | ICD-10-CM | POA: Diagnosis not present

## 2022-02-12 DIAGNOSIS — E119 Type 2 diabetes mellitus without complications: Secondary | ICD-10-CM | POA: Diagnosis not present

## 2022-02-12 DIAGNOSIS — M47816 Spondylosis without myelopathy or radiculopathy, lumbar region: Secondary | ICD-10-CM | POA: Diagnosis not present

## 2022-02-12 DIAGNOSIS — Z7985 Long-term (current) use of injectable non-insulin antidiabetic drugs: Secondary | ICD-10-CM | POA: Diagnosis not present

## 2022-02-12 DIAGNOSIS — Z8673 Personal history of transient ischemic attack (TIA), and cerebral infarction without residual deficits: Secondary | ICD-10-CM | POA: Diagnosis not present

## 2022-02-12 DIAGNOSIS — Z79899 Other long term (current) drug therapy: Secondary | ICD-10-CM | POA: Diagnosis not present

## 2022-02-12 HISTORY — DX: Sacroiliitis, not elsewhere classified: M46.1

## 2022-02-12 HISTORY — DX: Other chronic pain: G89.29

## 2022-02-19 DIAGNOSIS — K625 Hemorrhage of anus and rectum: Secondary | ICD-10-CM | POA: Diagnosis not present

## 2022-02-19 DIAGNOSIS — E1159 Type 2 diabetes mellitus with other circulatory complications: Secondary | ICD-10-CM | POA: Diagnosis not present

## 2022-02-19 DIAGNOSIS — Z139 Encounter for screening, unspecified: Secondary | ICD-10-CM | POA: Diagnosis not present

## 2022-02-19 DIAGNOSIS — Z6839 Body mass index (BMI) 39.0-39.9, adult: Secondary | ICD-10-CM | POA: Diagnosis not present

## 2022-02-19 DIAGNOSIS — I152 Hypertension secondary to endocrine disorders: Secondary | ICD-10-CM | POA: Diagnosis not present

## 2022-02-21 DIAGNOSIS — Z6841 Body Mass Index (BMI) 40.0 and over, adult: Secondary | ICD-10-CM | POA: Diagnosis not present

## 2022-02-21 DIAGNOSIS — Z139 Encounter for screening, unspecified: Secondary | ICD-10-CM | POA: Diagnosis not present

## 2022-02-21 DIAGNOSIS — D649 Anemia, unspecified: Secondary | ICD-10-CM | POA: Diagnosis not present

## 2022-02-21 DIAGNOSIS — K649 Unspecified hemorrhoids: Secondary | ICD-10-CM | POA: Diagnosis not present

## 2022-02-26 DIAGNOSIS — M7918 Myalgia, other site: Secondary | ICD-10-CM | POA: Diagnosis not present

## 2022-02-26 DIAGNOSIS — M533 Sacrococcygeal disorders, not elsewhere classified: Secondary | ICD-10-CM | POA: Diagnosis not present

## 2022-03-07 DIAGNOSIS — E1129 Type 2 diabetes mellitus with other diabetic kidney complication: Secondary | ICD-10-CM | POA: Diagnosis not present

## 2022-03-07 DIAGNOSIS — E782 Mixed hyperlipidemia: Secondary | ICD-10-CM | POA: Diagnosis not present

## 2022-03-07 DIAGNOSIS — E1159 Type 2 diabetes mellitus with other circulatory complications: Secondary | ICD-10-CM | POA: Diagnosis not present

## 2022-03-09 ENCOUNTER — Other Ambulatory Visit: Payer: Self-pay

## 2022-03-09 MED ORDER — METOPROLOL SUCCINATE ER 100 MG PO TB24: 100.0000 mg | ORAL_TABLET | Freq: Every day | ORAL | 3 refills | Status: DC

## 2022-03-16 DIAGNOSIS — E1159 Type 2 diabetes mellitus with other circulatory complications: Secondary | ICD-10-CM | POA: Diagnosis not present

## 2022-03-16 DIAGNOSIS — D649 Anemia, unspecified: Secondary | ICD-10-CM | POA: Diagnosis not present

## 2022-03-19 DIAGNOSIS — E1159 Type 2 diabetes mellitus with other circulatory complications: Secondary | ICD-10-CM | POA: Diagnosis not present

## 2022-03-19 DIAGNOSIS — E782 Mixed hyperlipidemia: Secondary | ICD-10-CM | POA: Diagnosis not present

## 2022-03-19 DIAGNOSIS — Z6841 Body Mass Index (BMI) 40.0 and over, adult: Secondary | ICD-10-CM | POA: Diagnosis not present

## 2022-03-19 DIAGNOSIS — E1129 Type 2 diabetes mellitus with other diabetic kidney complication: Secondary | ICD-10-CM | POA: Diagnosis not present

## 2022-03-19 DIAGNOSIS — N1831 Chronic kidney disease, stage 3a: Secondary | ICD-10-CM | POA: Diagnosis not present

## 2022-03-19 DIAGNOSIS — I152 Hypertension secondary to endocrine disorders: Secondary | ICD-10-CM | POA: Diagnosis not present

## 2022-03-28 DIAGNOSIS — G8929 Other chronic pain: Secondary | ICD-10-CM | POA: Diagnosis not present

## 2022-03-28 DIAGNOSIS — M461 Sacroiliitis, not elsewhere classified: Secondary | ICD-10-CM | POA: Diagnosis not present

## 2022-03-28 DIAGNOSIS — M545 Low back pain, unspecified: Secondary | ICD-10-CM | POA: Diagnosis not present

## 2022-03-28 DIAGNOSIS — M5136 Other intervertebral disc degeneration, lumbar region: Secondary | ICD-10-CM | POA: Diagnosis not present

## 2022-03-28 DIAGNOSIS — M47816 Spondylosis without myelopathy or radiculopathy, lumbar region: Secondary | ICD-10-CM | POA: Diagnosis not present

## 2022-04-10 DIAGNOSIS — Z6839 Body mass index (BMI) 39.0-39.9, adult: Secondary | ICD-10-CM | POA: Diagnosis not present

## 2022-04-10 DIAGNOSIS — I152 Hypertension secondary to endocrine disorders: Secondary | ICD-10-CM | POA: Diagnosis not present

## 2022-04-10 DIAGNOSIS — L739 Follicular disorder, unspecified: Secondary | ICD-10-CM | POA: Diagnosis not present

## 2022-04-10 DIAGNOSIS — E1159 Type 2 diabetes mellitus with other circulatory complications: Secondary | ICD-10-CM | POA: Diagnosis not present

## 2022-04-10 DIAGNOSIS — E1129 Type 2 diabetes mellitus with other diabetic kidney complication: Secondary | ICD-10-CM | POA: Diagnosis not present

## 2022-04-15 DIAGNOSIS — E1140 Type 2 diabetes mellitus with diabetic neuropathy, unspecified: Secondary | ICD-10-CM | POA: Diagnosis not present

## 2022-04-16 DIAGNOSIS — E782 Mixed hyperlipidemia: Secondary | ICD-10-CM | POA: Diagnosis not present

## 2022-04-16 DIAGNOSIS — E1159 Type 2 diabetes mellitus with other circulatory complications: Secondary | ICD-10-CM | POA: Diagnosis not present

## 2022-04-18 DIAGNOSIS — E1159 Type 2 diabetes mellitus with other circulatory complications: Secondary | ICD-10-CM | POA: Diagnosis not present

## 2022-04-23 DIAGNOSIS — Z6841 Body Mass Index (BMI) 40.0 and over, adult: Secondary | ICD-10-CM | POA: Diagnosis not present

## 2022-04-23 DIAGNOSIS — N1831 Chronic kidney disease, stage 3a: Secondary | ICD-10-CM | POA: Diagnosis not present

## 2022-04-23 DIAGNOSIS — E1129 Type 2 diabetes mellitus with other diabetic kidney complication: Secondary | ICD-10-CM | POA: Diagnosis not present

## 2022-05-08 DIAGNOSIS — Z6841 Body Mass Index (BMI) 40.0 and over, adult: Secondary | ICD-10-CM | POA: Diagnosis not present

## 2022-05-08 DIAGNOSIS — N390 Urinary tract infection, site not specified: Secondary | ICD-10-CM | POA: Diagnosis not present

## 2022-05-08 DIAGNOSIS — R300 Dysuria: Secondary | ICD-10-CM | POA: Diagnosis not present

## 2022-05-08 DIAGNOSIS — R079 Chest pain, unspecified: Secondary | ICD-10-CM | POA: Diagnosis not present

## 2022-05-08 DIAGNOSIS — D649 Anemia, unspecified: Secondary | ICD-10-CM | POA: Diagnosis not present

## 2022-05-10 DIAGNOSIS — M5136 Other intervertebral disc degeneration, lumbar region: Secondary | ICD-10-CM | POA: Diagnosis not present

## 2022-05-10 DIAGNOSIS — G8929 Other chronic pain: Secondary | ICD-10-CM | POA: Diagnosis not present

## 2022-05-10 DIAGNOSIS — M47816 Spondylosis without myelopathy or radiculopathy, lumbar region: Secondary | ICD-10-CM | POA: Diagnosis not present

## 2022-05-14 DIAGNOSIS — R0602 Shortness of breath: Secondary | ICD-10-CM | POA: Diagnosis not present

## 2022-05-14 DIAGNOSIS — R079 Chest pain, unspecified: Secondary | ICD-10-CM | POA: Diagnosis not present

## 2022-05-14 DIAGNOSIS — R0609 Other forms of dyspnea: Secondary | ICD-10-CM | POA: Diagnosis not present

## 2022-05-15 ENCOUNTER — Observation Stay (HOSPITAL_COMMUNITY)
Admit: 2022-05-15 | Discharge: 2022-05-17 | Disposition: A | Discharge status: Home / Self Care | Payer: Medicare HMO | Source: Other Acute Inpatient Hospital | Attending: Internal Medicine | Admitting: Internal Medicine

## 2022-05-15 ENCOUNTER — Encounter (HOSPITAL_COMMUNITY): Payer: Self-pay

## 2022-05-15 ENCOUNTER — Other Ambulatory Visit: Payer: Self-pay

## 2022-05-15 ENCOUNTER — Encounter (HOSPITAL_COMMUNITY): Payer: Self-pay | Admitting: Internal Medicine

## 2022-05-15 DIAGNOSIS — G8929 Other chronic pain: Secondary | ICD-10-CM | POA: Insufficient documentation

## 2022-05-15 DIAGNOSIS — Z8673 Personal history of transient ischemic attack (TIA), and cerebral infarction without residual deficits: Secondary | ICD-10-CM | POA: Insufficient documentation

## 2022-05-15 DIAGNOSIS — Z7984 Long term (current) use of oral hypoglycemic drugs: Secondary | ICD-10-CM | POA: Diagnosis not present

## 2022-05-15 DIAGNOSIS — Z7409 Other reduced mobility: Secondary | ICD-10-CM | POA: Diagnosis not present

## 2022-05-15 DIAGNOSIS — E1140 Type 2 diabetes mellitus with diabetic neuropathy, unspecified: Secondary | ICD-10-CM | POA: Diagnosis not present

## 2022-05-15 DIAGNOSIS — I10 Essential (primary) hypertension: Secondary | ICD-10-CM | POA: Diagnosis not present

## 2022-05-15 DIAGNOSIS — I444 Left anterior fascicular block: Secondary | ICD-10-CM | POA: Diagnosis not present

## 2022-05-15 DIAGNOSIS — I160 Hypertensive urgency: Secondary | ICD-10-CM | POA: Diagnosis not present

## 2022-05-15 DIAGNOSIS — E119 Type 2 diabetes mellitus without complications: Secondary | ICD-10-CM | POA: Insufficient documentation

## 2022-05-15 DIAGNOSIS — E785 Hyperlipidemia, unspecified: Secondary | ICD-10-CM | POA: Diagnosis not present

## 2022-05-15 DIAGNOSIS — Z79899 Other long term (current) drug therapy: Secondary | ICD-10-CM | POA: Insufficient documentation

## 2022-05-15 DIAGNOSIS — J9811 Atelectasis: Secondary | ICD-10-CM | POA: Diagnosis not present

## 2022-05-15 DIAGNOSIS — R0789 Other chest pain: Secondary | ICD-10-CM | POA: Diagnosis not present

## 2022-05-15 DIAGNOSIS — I209 Angina pectoris, unspecified: Secondary | ICD-10-CM

## 2022-05-15 DIAGNOSIS — I639 Cerebral infarction, unspecified: Secondary | ICD-10-CM | POA: Diagnosis not present

## 2022-05-15 DIAGNOSIS — I447 Left bundle-branch block, unspecified: Secondary | ICD-10-CM | POA: Diagnosis not present

## 2022-05-15 DIAGNOSIS — I25720 Atherosclerosis of autologous artery coronary artery bypass graft(s) with unstable angina pectoris: Principal | ICD-10-CM | POA: Insufficient documentation

## 2022-05-15 DIAGNOSIS — M549 Dorsalgia, unspecified: Secondary | ICD-10-CM | POA: Diagnosis not present

## 2022-05-15 DIAGNOSIS — I249 Acute ischemic heart disease, unspecified: Secondary | ICD-10-CM | POA: Diagnosis not present

## 2022-05-15 DIAGNOSIS — I200 Unstable angina: Secondary | ICD-10-CM | POA: Diagnosis not present

## 2022-05-15 DIAGNOSIS — R000 Tachycardia, unspecified: Secondary | ICD-10-CM | POA: Diagnosis not present

## 2022-05-15 DIAGNOSIS — R079 Chest pain, unspecified: Secondary | ICD-10-CM | POA: Diagnosis not present

## 2022-05-15 DIAGNOSIS — R9431 Abnormal electrocardiogram [ECG] [EKG]: Secondary | ICD-10-CM | POA: Diagnosis not present

## 2022-05-15 HISTORY — DX: Angina pectoris, unspecified: I20.9

## 2022-05-15 LAB — CBC
HCT: 33.2 % — ABNORMAL LOW (ref 36.0–46.0)
Hemoglobin: 10.5 g/dL — ABNORMAL LOW (ref 12.0–15.0)
MCH: 27.9 pg (ref 26.0–34.0)
MCHC: 31.6 g/dL (ref 30.0–36.0)
MCV: 88.3 fL (ref 80.0–100.0)
Platelets: 460 10*3/uL — ABNORMAL HIGH (ref 150–400)
RBC: 3.76 MIL/uL — ABNORMAL LOW (ref 3.87–5.11)
RDW: 14.6 % (ref 11.5–15.5)
WBC: 11.7 10*3/uL — ABNORMAL HIGH (ref 4.0–10.5)
nRBC: 0 % (ref 0.0–0.2)

## 2022-05-15 LAB — COMPREHENSIVE METABOLIC PANEL
ALT: 15 U/L (ref 0–44)
AST: 21 U/L (ref 15–41)
Albumin: 3.7 g/dL (ref 3.5–5.0)
Alkaline Phosphatase: 50 U/L (ref 38–126)
Anion gap: 13 (ref 5–15)
BUN: 14 mg/dL (ref 8–23)
CO2: 23 mmol/L (ref 22–32)
Calcium: 9.4 mg/dL (ref 8.9–10.3)
Chloride: 105 mmol/L (ref 98–111)
Creatinine, Ser: 0.81 mg/dL (ref 0.44–1.00)
GFR, Estimated: >60 mL/min (ref >60)
Glucose, Bld: 93 mg/dL (ref 70–99)
Potassium: 3.3 mmol/L — ABNORMAL LOW (ref 3.5–5.1)
Sodium: 141 mmol/L (ref 135–145)
Total Bilirubin: 1.1 mg/dL (ref 0.3–1.2)
Total Protein: 6.4 g/dL — ABNORMAL LOW (ref 6.5–8.1)

## 2022-05-15 LAB — LAB REPORT - SCANNED

## 2022-05-15 LAB — HEMOGLOBIN A1C
Hgb A1c MFr Bld: 5.9 % — ABNORMAL HIGH (ref 4.8–5.6)
Mean Plasma Glucose: 122.63 mg/dL

## 2022-05-15 LAB — MAGNESIUM: Magnesium: 1.1 mg/dL — ABNORMAL LOW (ref 1.7–2.4)

## 2022-05-15 LAB — TSH: TSH: 1.085 u[IU]/mL (ref 0.350–4.500)

## 2022-05-15 MED ORDER — CARVEDILOL 6.25 MG PO TABS
6.2500 mg | ORAL_TABLET | Freq: Two times a day (BID) | ORAL | Status: DC
  Administered 2022-05-16: 6.25 mg via ORAL
  Filled 2022-05-15: qty 1

## 2022-05-15 MED ORDER — SODIUM CHLORIDE 0.9% FLUSH: 3.0000 mL | INTRAVENOUS | Status: DC | PRN

## 2022-05-15 MED ORDER — NITROGLYCERIN 0.4 MG SL SUBL: 0.4000 mg | SUBLINGUAL_TABLET | SUBLINGUAL | Status: DC | PRN

## 2022-05-15 MED ORDER — SODIUM CHLORIDE 0.9 % WEIGHT BASED INFUSION: 3.0000 mL/kg/h | INTRAVENOUS | Status: DC

## 2022-05-15 MED ORDER — IRBESARTAN 300 MG PO TABS
150.0000 mg | ORAL_TABLET | Freq: Every day | ORAL | Status: DC
  Administered 2022-05-16 – 2022-05-17 (×2): 150 mg via ORAL
  Filled 2022-05-15 (×2): qty 1

## 2022-05-15 MED ORDER — CLOPIDOGREL BISULFATE 75 MG PO TABS: 75.0000 mg | ORAL_TABLET | Freq: Every day | ORAL | Status: DC

## 2022-05-15 MED ORDER — SODIUM CHLORIDE 0.9 % WEIGHT BASED INFUSION
1.0000 mL/kg/h | INTRAVENOUS | Status: DC
  Administered 2022-05-16: 1 mL/kg/h via INTRAVENOUS

## 2022-05-15 MED ORDER — NITROGLYCERIN IN D5W 200-5 MCG/ML-% IV SOLN
0.0000 ug/min | INTRAVENOUS | Status: DC
  Administered 2022-05-15: 5 ug/min via INTRAVENOUS
  Filled 2022-05-15: qty 250

## 2022-05-15 MED ORDER — HEPARIN SODIUM (PORCINE) 5000 UNIT/ML IJ SOLN
5000.0000 [IU] | Freq: Three times a day (TID) | INTRAMUSCULAR | Status: DC
  Administered 2022-05-15 – 2022-05-17 (×5): 5000 [IU] via SUBCUTANEOUS
  Filled 2022-05-15 (×5): qty 1

## 2022-05-15 MED ORDER — ASPIRIN 81 MG PO CHEW
81.0000 mg | CHEWABLE_TABLET | ORAL | Status: AC
  Administered 2022-05-16: 81 mg via ORAL
  Filled 2022-05-15: qty 1

## 2022-05-15 MED ORDER — CARVEDILOL 6.25 MG PO TABS: 6.2500 mg | ORAL_TABLET | Freq: Two times a day (BID) | ORAL | Status: DC

## 2022-05-15 MED ORDER — SODIUM CHLORIDE 0.9% FLUSH
3.0000 mL | Freq: Two times a day (BID) | INTRAVENOUS | Status: DC
  Administered 2022-05-16: 3 mL via INTRAVENOUS

## 2022-05-15 MED ORDER — SODIUM CHLORIDE 0.9 % IV SOLN: 250.0000 mL | INTRAVENOUS | Status: DC | PRN

## 2022-05-15 MED ORDER — INSULIN ASPART 100 UNIT/ML IJ SOLN
0.0000 [IU] | Freq: Three times a day (TID) | INTRAMUSCULAR | Status: DC
  Administered 2022-05-17 (×2): 2 [IU] via SUBCUTANEOUS

## 2022-05-15 MED ORDER — HYDRALAZINE HCL 20 MG/ML IJ SOLN
10.0000 mg | Freq: Once | INTRAMUSCULAR | Status: AC
  Administered 2022-05-15: 10 mg via INTRAVENOUS
  Filled 2022-05-15: qty 1

## 2022-05-15 MED ORDER — ACETAMINOPHEN 325 MG PO TABS
650.0000 mg | ORAL_TABLET | ORAL | Status: DC | PRN
  Administered 2022-05-15 – 2022-05-17 (×4): 650 mg via ORAL
  Filled 2022-05-15 (×4): qty 2

## 2022-05-15 MED ORDER — ONDANSETRON HCL 4 MG/2ML IJ SOLN: 4.0000 mg | Freq: Four times a day (QID) | INTRAMUSCULAR | Status: DC | PRN

## 2022-05-15 NOTE — H&P (Addendum)
Cardiology Admission History and Physical   Patient ID: Gloria Sharp MRN: [id number]; DOB: 05-11-1945   Admission date: 05/15/2022  PCP:  Abner Greenspan, MD   Gloria Sharp Cardiologist:  Gypsy Balsam, MD   {   Chief Complaint:  chest pain   Patient Profile:   Gloria Sharp is a 77 y.o. female with PMH of aortic stenosis, type 2 diabetes, hypertension, hyperlipidemia, CVA,  NSVT, fibromyalgia, urolithiasis, GERD, who is transferred from [facility] to [facility] today for further evaluation of chest pain.     History of Present Illness:   Gloria Sharp with above PMH presented to [facility] today for chest pain.  She complains chest pressure feeling, shortness of breath with exertion for 2 weeks.  She noted her chest discomfort are usually associated with exertional activity. It feels like tightness and she often feels she can't breath.  Her son passed away 5 months ago and she has been experiencing significant anxiety.  Per EMS, she was found hypertensive with blood pressure 200/110 and tachycardic.  She states she takes all her medication accordingly. She is on pavix and had stroke twice. She denied hx of MI. She states her legs are not swollen today but can get swollen at times, takes lasix at home for this reason. She has a headache from nitro paste. She takes tylenol at home. She has no chest pain currently and felt symptoms are not usually occurred with resting. She states she went to ER yesterday and had stress test done, she was called today to return there because it was abnormal.   At [facility], diagnostic workup revealed WBC 9900, hemoglobin 10.6 platelets 457k.  INR 1.2.  CMP with creatinine 0.7 GFR>60. ProBNP 2560.  Trop 0.07 x1. Chest x-ray revealed minimal right basilar atelectasis.  EKG showed sinus rhythm 80bpm, LBBB. Stress Myoview from 05/14/22 revealed small reversible defect in the distal anterolateral wall suspicions for inducible  ischemia,  fixed defect in the inferior and anteroseptal wall suspicious for infarction, moderate LV dilatation with inferior and lateral wall hypokinesis, LVEF 46%. Cardiology Dr Thelma Comp recommended the patient transferred to [facility] for heart cath. Therapeutic anticoagulation was felt not indicated at the time.   Per chart review, patient follows cardiology Dr Bing Matter outpatient, has nonrheumatic aortic stenosis, most recent echo from 12/01/2021 revealed LVEF 60 to 65%, no RWMA, mild LVH, indeterminate diastolic parameter, normal RV, mildly elevated PASP, mild to moderate mitral regurgitation, mild mitral stenosis, mean mitral valve gradient 3 mmHg, mild to moderate aortic stenosis with VTI 1.44 cm2, mean gradient 13.5 mmHg.  Most recent stress myoview from 02/15/21 showed low risk study. She has hx of NSVT that was noted on Zio monitor on 10/20/2018. She was last seen 11/15/21 in the office, doing well without angina/syncope, recovering from hernia surgery without issue, was considering surgery for hip, mobility is limited due to orthopedic issue.  She has been maintained on Plavix 75mg , Toprol XL 100mg , lisinopril 20mg , pravastatin 20mg  medically.       Past Medical History:  Diagnosis Date   Aortic stenosis 09/04/2019   CVA (cerebral vascular accident) [facility]) 2015?   Cystocele with uterine prolapse 01/17/2018   Diabetes (HCC)    Dyslipidemia 10/10/2018   Essential hypertension 10/10/2018   Fibromyalgia    GERD (gastroesophageal reflux disease)    Heart murmur 10/10/2018   History of CVA (cerebrovascular accident) 10/10/2018   Hypercholesterolemia    Hypertension    Leukocytosis    Mitral  regurgitation 11/24/2018   Nonsustained ventricular tachycardia (HCC) 11/24/2018   Obesity    Post-operative nausea and vomiting    "hard to wake up"   Rectocele 01/17/2018   Type 2 diabetes mellitus without complication, without long-term current use of insulin (HCC) 10/10/2018    Past Surgical History:   Procedure Laterality Date   ABDOMINAL HYSTERECTOMY     partial, left the ovaries   APPENDECTOMY     BREAST CYST ASPIRATION Right    COLONOSCOPY  11/11/2013   Colonioc polyps status post polypectomy. Pancolonic diverticulosis predominately in the sigmoid colon   CYSTOSCOPY W/ URETERAL STENT PLACEMENT Right 03/26/2021   Procedure: CYSTOSCOPY WITH RETROGRADE PYELOGRAM/URETERAL STENT PLACEMENT;  Surgeon: Jannifer Hick, MD;  Location: [facility];  Service: Urology;  Laterality: Right;   CYSTOSCOPY/URETEROSCOPY/HOLMIUM LASER/STENT PLACEMENT Right 04/17/2021   Procedure: CYSTOSCOPY/ RETROGRADE/URETEROSCOPY/HOLMIUM LASER/STENT PLACEMENT;  Surgeon: Jannifer Hick, MD;  Location: [facility];  Service: Urology;  Laterality: Right;  ONLY NEEDS 60 MIN   DILATION AND CURETTAGE OF UTERUS     GALLBLADDER SURGERY Right 07/2014   HERNIA REPAIR  02/20/2021   Umbilical   vaginal polyp removal       Medications Prior to Admission: Prior to Admission medications   Medication Sig Start Date End Date Taking? Authorizing Provider  acetaminophen (TYLENOL) 650 MG CR tablet Take 650 mg by mouth daily as needed for pain.    [provider]  alendronate (FOSAMAX) 35 MG tablet Take 35 mg by mouth every 7 (seven) days.    [provider]  brimonidine (ALPHAGAN) 0.15 % ophthalmic solution Place 1 drop into both eyes 2 (two) times daily. 08/10/19   [provider]  cholestyramine (QUESTRAN) 4 g packet Take 1 packet (4 g total) by mouth daily. Take at least 2 hours before or after rest of the medications Patient taking differently: Take 4 g by mouth See admin instructions. Every 3 days 03/25/19   Lynann Bologna, MD  clobetasol cream (TEMOVATE) 0.05 % Apply 1 application. topically daily as needed (itching).    [provider]  clopidogrel (PLAVIX) 75 MG tablet Take 75 mg by mouth daily. 01/16/21   [provider]  diclofenac Sodium (VOLTAREN) 1 % GEL Apply 2-4 g topically at bedtime as  needed (knee pain).    [provider]  docusate sodium (COLACE) 100 MG capsule Take 1 capsule (100 mg total) by mouth daily as needed for up to 40 doses. Patient taking differently: Take 100 mg by mouth daily as needed for mild constipation or moderate constipation. 04/17/21   Jannifer Hick, MD  fenofibrate (TRICOR) 145 MG tablet Take 145 mg by mouth daily.    [provider]  ferrous sulfate 325 (65 FE) MG EC tablet Take 325 mg by mouth daily with lunch.    [provider]  furosemide (LASIX) 20 MG tablet TAKE ONE TABLET BY MOUTH EVERY DAY Patient taking differently: Take 20 mg by mouth daily. 05/24/21   Georgeanna Lea, MD  gabapentin (NEURONTIN) 300 MG capsule Take 300 mg by mouth 2 (two) times daily.    [provider]  lisinopril (ZESTRIL) 20 MG tablet Take 1 tablet (20 mg total) by mouth daily. 03/30/21 11/15/21  Albertine Grates, MD  MAGNESIUM PO Take 500 mg by mouth daily.    [provider]  metFORMIN (GLUCOPHAGE) 1000 MG tablet Take 1,000 mg by mouth 2 (two) times daily with a meal.    [provider]  metoprolol succinate (  TOPROL-XL) 100 MG 24 hr tablet Take 1 tablet (100 mg total) by mouth daily. 03/09/22   Georgeanna Lea, MD  Omega-3 Fatty Acids (FISH OIL PO) Take 2,000 mg by mouth daily.    [provider]  OVER THE COUNTER MEDICATION Apply 1-4 patches topically daily as needed (back pain).    [provider]  oxyCODONE (ROXICODONE) 5 MG immediate release tablet Take 1 tablet (5 mg total) by mouth every 4 (four) hours as needed for up to 15 doses for severe pain. 04/17/21   Jannifer Hick, MD  pantoprazole (PROTONIX) 40 MG tablet Take 40 mg by mouth daily as needed (acid reflux).    [provider]  pioglitazone (ACTOS) 15 MG tablet Take 15 mg by mouth daily. 01/25/20   [provider]  pravastatin (PRAVACHOL) 20 MG tablet Take 20 mg by mouth once a week.    [provider]  Semaglutide, 1  MG/DOSE, (OZEMPIC, 1 MG/DOSE,) 2 MG/1.5ML SOPN Inject 1 mg into the skin once a week.    [provider]  TURMERIC PO Take 1 tablet by mouth daily.    [provider]  vitamin B-12 (CYANOCOBALAMIN) 1000 MCG tablet Take 1,000 mcg by mouth daily.    [provider]     Allergies:    Allergies  Allergen Reactions   Celecoxib Itching and Swelling    Swelling- whole body and turns red   Nabumetone Itching and Swelling   Ciprofloxacin Hives, Swelling and Rash   Flagyl [Metronidazole] Itching, Swelling and Rash    Social History:   Social History   Socioeconomic History   Marital status: Married    Spouse name: Not on file   Number of children: Not on file   Years of education: Not on file   Highest education level: Not on file  Occupational History   Not on file  Tobacco Use   Smoking status: Never   Smokeless tobacco: Never  Vaping Use   Vaping Use: Never used  Substance and Sexual Activity   Alcohol use: Not Currently   Drug use: Never   Sexual activity: Not on file  Other Topics Concern   Not on file  Social History Narrative   Not on file   Social Determinants of Health   Financial Resource Strain: Not on file  Food Insecurity: Not on file  Transportation Needs: Not on file  Physical Activity: Not on file  Stress: Not on file  Social Connections: Not on file  Intimate Partner Violence: Not on file    Family History:   The patient's family history includes Cancer in her mother; Diabetes in her mother; Heart disease in her father; Heart failure in her mother; Hypertension in her father and mother; Stroke in her mother. There is no history of Colon cancer, Esophageal cancer, Stomach cancer, Rectal cancer, or Colon polyps.    ROS:  Constitutional: Denied fever, chills, malaise, night sweats Eyes: Denied vision change or loss Ears/Nose/Mouth/Throat: Denied ear ache, sore throat, coughing, sinus pain Cardiovascular: see HPI  Respiratory:  see HPI  Gastrointestinal: Denied nausea, vomiting, abdominal pain, diarrhea Genital/Urinary: Denied dysuria, hematuria, urinary frequency/urgency Musculoskeletal: Denied muscle ache, joint pain, weakness Skin: Denied rash, wound Neuro: Denied headache, dizziness, syncope Psych: Denied history of depression/anxiety  Endocrine: history of diabetes     Physical Exam/Data:   Vitals:   05/15/22 1756  BP: (!) 196/91  Pulse: 90  Resp: 16  Temp: 98.1 F (36.7 C)  TempSrc: Oral  SpO2: 96%  Weight: 96.3 kg  Height: 5' (1.524 m)   No intake or output data in the 24 hours ending 05/15/22 1819    05/15/2022    5:56 PM 11/15/2021    1:17 PM 04/17/2021   12:29 PM  Last 3 Weights  Weight (lbs) 212 lb 4.9 oz 212 lb 214 lb  Weight (kg) 96.3 kg 96.163 kg 97.07 kg     Body mass index is 41.46 kg/m.   Vitals:  Vitals:   05/15/22 1756  BP: (!) 196/91  Pulse: 90  Resp: 16  Temp: 98.1 F (36.7 C)  SpO2: 96%   General Appearance: In no apparent distress, sitting in bed, obese  HEENT: Normocephalic, atraumatic.  Neck: Supple, trachea midline, no JVDs Cardiovascular: Regular rate and rhythm, normal S1-S2,  systolic murmur grade III RUSB Respiratory: Resting breathing unlabored, lungs sounds clear to auscultation bilaterally, no use of accessory muscles. On room air.  No wheezes, rales or rhonchi.   Gastrointestinal: Bowel sounds positive, abdomen soft, non-tender Extremities: Able to move all extremities in bed without difficulty, no edema of BLE Musculoskeletal: Normal muscle bulk and tone Skin: Intact, warm, dry. No rashes or petechiae noted in exposed areas.  Neurologic: Alert, oriented to person, place and time. Fluent speech,  no cognitive deficit, no gross focal neuro deficit Psychiatric: Normal affect. Mood is appropriate.      EKG:  ECG at [facility] showed sinus rhythm with LBBB  Relevant CV Studies:  Stress myoview at [place]:  Stress Myoview revealed small reversible  defect in the distal anterolateral wall suspicions for inducible ischemia,  fixed defect in the inferior and anteroseptal wall suspicious for infarction, moderate LV dilatation with inferior and lateral wall hypokinesis, LVEF 46%   Echo from 12/01/21:    1. GLS -14.5. Left ventricular ejection fraction, by estimation, is 60 to  65%. The left ventricle has normal function. The left ventricle has no  regional wall motion abnormalities. There is mild left ventricular  hypertrophy. Left ventricular diastolic  parameters are indeterminate.   2. Right ventricular systolic function is normal. The right ventricular  size is normal. There is mildly elevated pulmonary artery systolic  pressure.   3. The mitral valve is normal in structure. Mild to moderate mitral valve  regurgitation. Mild mitral stenosis. The mean mitral valve gradient is 3.0  mmHg.   4. The aortic valve is calcified. There is mild calcification of the  aortic valve. There is mild thickening of the aortic valve. Aortic valve  regurgitation is not visualized. Mild to moderate aortic valve stenosis.  Aortic valve area, by VTI measures  1.44 cm. Aortic valve mean gradient measures 13.5 mmHg.   5. The inferior vena cava is normal in size with greater than 50%  respiratory variability, suggesting right atrial pressure of 3 mmHg.    Stress myoview 02/15/21:     The study is normal. The study is low risk.   Left ventricular function is normal. Nuclear stress EF: 70 %. The left ventricular ejection fraction is hyperdynamic (>65%).   Prior study available for comparison from 01/21/2019    Laboratory Data:  High Sensitivity Troponin:  No results for input(s): "TROPONINIHS" in the last 720 hours.    ChemistryNo results for input(s): "NA", "K", "CL", "CO2", "GLUCOSE", "BUN", "CREATININE", "CALCIUM", "MG", "GFRNONAA", "GFRAA", "ANIONGAP" in the last 168 hours.  No results for input(s): "PROT", "ALBUMIN", "AST", "ALT", "ALKPHOS",  "BILITOT" in the last 168 hours. Lipids No results for input(s): "CHOL", "TRIG", "  HDL", "LABVLDL", "LDLCALC", "CHOLHDL" in the last 168 hours. HematologyNo results for input(s): "WBC", "RBC", "HGB", "HCT", "MCV", "MCH", "MCHC", "RDW", "PLT" in the last 168 hours. Thyroid No results for input(s): "TSH", "FREET4" in the last 168 hours. BNPNo results for input(s): "BNP", "PROBNP" in the last 168 hours.  DDimer No results for input(s): "DDIMER" in the last 168 hours.   Radiology/Studies:  No results found.   Assessment and Plan:   Unstable angina  Abnormal stress test at outside facility  - presented to [facility] for chest pain associated with exertion, DOE for 2 weeks  - Trop 0.06 x1 - EKG showed SR with LBBB - Stress myvoeiw 4/29 showed  small reversible defect in the distal anterolateral wall suspicions for inducible ischemia,  fixed defect in the inferior and anteroseptal wall suspicious for infarction, moderate LV dilatation with inferior and lateral wall hypokinesis, LVEF 46%  - transferred here for cath, NPO after midnight, plan for cath tomorrow  - Medical therapy: heparin gtt was not started at [facility], currently not indicated, HOLD PTA plavix 75mg  daily (in case need CABG/reviewed with Dr Lynnette Caffey), stop PTA lisinopril and start irbesartan 150mg  daily, stop Toprolol XL 100mg  daily and start Coreg 6.25mg  BID, continue pravastatin 20mg   - will admit to telemetry, check EKG, A1C, lipid panel, Echo, more recommendation pending cath findings   HTN urgency - BP was >200 systolic at [facility], currently 196/91, received losartan and nitro paste at [facility],  has a headache probably due to nitro use, no other symptoms  - Will give IV hydralazine 10mg  x1 now, start PO  irbesartan 150mg  and coreg 6.25mg  BID, continue PTA Lasix,  stop PTA lisinopril and Toprol XL and HCTZ - Please remove nitro paste, start nitro gtt  Mild to moderate aortic stenosis Mild to moderate mitral regurgitation Mild  mitral stenosis  - Will repeat Echo this admission  - On lasix 20mg  daily PTA, can continue, she appears euvolemic at this time   Type 2 DM - will check A1C - Hold PTA metformin and Actos and ozempic - will start Novolog insulin AC while in house - [facility] diet when resume PO intake   HLD - continue PTA pravastatin and fenofibrate, check lipid panel   Hx of CVA - [place] 2016, HOLD plavix and statin as above   GERD - resume PTA protonix   DVT prophylaxis - continue SQ heparin q8h  AD: Full code Dispo: pending clinical course     Shared Decision Making/Informed Consent{   The risks [stroke (1 in 1000), death (1 in 1000), kidney failure [usually temporary] (1 in 500), bleeding (1 in 200), allergic reaction [possibly serious] (1 in 200)], benefits (diagnostic support and management of coronary artery disease) and alternatives of a cardiac catheterization were discussed in detail with Gloria Sharp and she is willing to proceed.      Risk Assessment/Risk Scores:  {  Severity of Illness: The appropriate patient status for this patient is OBSERVATION. Observation status is judged to be reasonable and necessary in order to provide the required intensity of service to ensure the patient's safety. The patient's presenting symptoms, physical exam findings, and initial radiographic and laboratory data in the context of their medical condition is felt to place them at decreased risk for further clinical deterioration. Furthermore, it is anticipated that the patient will be medically stable for discharge from the hospital within 2 midnights of admission.      For questions or updates, please contact [facility] Please consult  [email or web address] for contact info under     Signed, Cyndi Bender, NP  05/15/2022 6:19 PM    ATTENDING ATTESTATION:  After conducting a review of all available clinical information with the care team, interviewing the patient, and performing a physical exam, I agree  with the findings and plan described in this note.   GEN: No acute distress.   HEENT:  MMM, no JVD, no scleral icterus Cardiac: RRR, no murmurs, rubs, or gallops.  Respiratory: Clear to auscultation bilaterally. GI: Soft, nontender, non-distended  MS: No edema; No deformity. Neuro:  Nonfocal  Vasc:  +2 radial pulses  Patient is a 77 year old female with a history of mild to moderate aortic stenosis, type 2 diabetes not on insulin, hypertension, hyperlipidemia, fibromyalgia, and a history of strokes on 2 different occasions on chronic Plavix who has developed exertional angina.  This started about 2 weeks ago.  She tells me it feels like a chest tightness.  She denies any significant presyncope or syncope but has had some shortness of breath associated with this.  She denies any paroxysmal nocturnal dyspnea or peripheral edema.  She is currently chest pain-free.  She was referred for a stress test with the findings as detailed above.  Her last echocardiogram around 6 months ago did show normal ejection fraction.  While her troponins are negative I think she needs an invasive assessment as her stress findings may suggest multivessel disease.  She is quite hypertensive and this does correspond with the onset of her symptoms.  We will stop her Nitropaste patch and start a nitroglycerin infusion.  We are starting irbesartan and Coreg as well.  Will hold her Plavix for now in case she has multivessel disease on planned coronary angiography tomorrow.  She has a strong right radial pulse which is appropriate for coronary angiography.  Alverda Skeans, MD Pager [phone]

## 2022-05-16 ENCOUNTER — Observation Stay (HOSPITAL_COMMUNITY): Payer: Medicare HMO

## 2022-05-16 ENCOUNTER — Encounter (HOSPITAL_COMMUNITY)
Disposition: A | Discharge status: Home / Self Care | Payer: Self-pay | Source: Other Acute Inpatient Hospital | Attending: Internal Medicine

## 2022-05-16 ENCOUNTER — Ambulatory Visit (HOSPITAL_COMMUNITY): Admit: 2022-05-16 | Payer: Medicare HMO | Admitting: Cardiovascular Disease

## 2022-05-16 DIAGNOSIS — I160 Hypertensive urgency: Secondary | ICD-10-CM | POA: Diagnosis not present

## 2022-05-16 DIAGNOSIS — I25720 Atherosclerosis of autologous artery coronary artery bypass graft(s) with unstable angina pectoris: Secondary | ICD-10-CM | POA: Diagnosis not present

## 2022-05-16 DIAGNOSIS — G8929 Other chronic pain: Secondary | ICD-10-CM | POA: Diagnosis not present

## 2022-05-16 DIAGNOSIS — E1159 Type 2 diabetes mellitus with other circulatory complications: Secondary | ICD-10-CM | POA: Diagnosis not present

## 2022-05-16 DIAGNOSIS — I25110 Atherosclerotic heart disease of native coronary artery with unstable angina pectoris: Secondary | ICD-10-CM

## 2022-05-16 DIAGNOSIS — Z7409 Other reduced mobility: Secondary | ICD-10-CM | POA: Diagnosis not present

## 2022-05-16 DIAGNOSIS — E119 Type 2 diabetes mellitus without complications: Secondary | ICD-10-CM | POA: Diagnosis not present

## 2022-05-16 DIAGNOSIS — M549 Dorsalgia, unspecified: Secondary | ICD-10-CM | POA: Diagnosis not present

## 2022-05-16 DIAGNOSIS — Z79899 Other long term (current) drug therapy: Secondary | ICD-10-CM | POA: Diagnosis not present

## 2022-05-16 DIAGNOSIS — D649 Anemia, unspecified: Secondary | ICD-10-CM | POA: Diagnosis not present

## 2022-05-16 DIAGNOSIS — Z8673 Personal history of transient ischemic attack (TIA), and cerebral infarction without residual deficits: Secondary | ICD-10-CM | POA: Diagnosis not present

## 2022-05-16 DIAGNOSIS — Z7984 Long term (current) use of oral hypoglycemic drugs: Secondary | ICD-10-CM | POA: Diagnosis not present

## 2022-05-16 DIAGNOSIS — I10 Essential (primary) hypertension: Secondary | ICD-10-CM | POA: Diagnosis not present

## 2022-05-16 HISTORY — PX: LEFT HEART CATH AND CORONARY ANGIOGRAPHY: CATH118249

## 2022-05-16 LAB — CBC
HCT: 31.9 % — ABNORMAL LOW (ref 36.0–46.0)
Hemoglobin: 10.4 g/dL — ABNORMAL LOW (ref 12.0–15.0)
MCH: 28 pg (ref 26.0–34.0)
MCHC: 32.6 g/dL (ref 30.0–36.0)
MCV: 86 fL (ref 80.0–100.0)
Platelets: 462 10*3/uL — ABNORMAL HIGH (ref 150–400)
RBC: 3.71 MIL/uL — ABNORMAL LOW (ref 3.87–5.11)
RDW: 14.6 % (ref 11.5–15.5)
WBC: 14.2 10*3/uL — ABNORMAL HIGH (ref 4.0–10.5)
nRBC: 0 % (ref 0.0–0.2)

## 2022-05-16 LAB — GLUCOSE, CAPILLARY
Glucose-Capillary: 111 mg/dL — ABNORMAL HIGH (ref 70–99)
Glucose-Capillary: 102 mg/dL — ABNORMAL HIGH (ref 70–99)
Glucose-Capillary: 136 mg/dL — ABNORMAL HIGH (ref 70–99)

## 2022-05-16 LAB — LIPID PANEL
Cholesterol: 127 mg/dL (ref 0–200)
HDL: 38 mg/dL — ABNORMAL LOW (ref >40)
LDL Cholesterol: 53 mg/dL (ref 0–99)
Total CHOL/HDL Ratio: 3.3 RATIO
Triglycerides: 179 mg/dL — ABNORMAL HIGH (ref <150)
VLDL: 36 mg/dL (ref 0–40)

## 2022-05-16 LAB — BASIC METABOLIC PANEL
Anion gap: 9 (ref 5–15)
BUN: 14 mg/dL (ref 8–23)
CO2: 23 mmol/L (ref 22–32)
Calcium: 9.2 mg/dL (ref 8.9–10.3)
Chloride: 105 mmol/L (ref 98–111)
Creatinine, Ser: 0.88 mg/dL (ref 0.44–1.00)
GFR, Estimated: >60 mL/min (ref >60)
Glucose, Bld: 137 mg/dL — ABNORMAL HIGH (ref 70–99)
Potassium: 3.2 mmol/L — ABNORMAL LOW (ref 3.5–5.1)
Sodium: 137 mmol/L (ref 135–145)

## 2022-05-16 LAB — PROTIME-INR
INR: 1.3 — ABNORMAL HIGH (ref 0.8–1.2)
Prothrombin Time: 16.5 seconds — ABNORMAL HIGH (ref 11.4–15.2)

## 2022-05-16 SURGERY — LEFT HEART CATH AND CORONARY ANGIOGRAPHY
Anesthesia: LOCAL

## 2022-05-16 MED ORDER — SODIUM CHLORIDE 0.9% FLUSH: 3.0000 mL | INTRAVENOUS | Status: DC | PRN

## 2022-05-16 MED ORDER — ATORVASTATIN CALCIUM 80 MG PO TABS
80.0000 mg | ORAL_TABLET | Freq: Every day | ORAL | Status: DC
  Administered 2022-05-16 – 2022-05-17 (×2): 80 mg via ORAL
  Filled 2022-05-16 (×2): qty 1

## 2022-05-16 MED ORDER — CLOTRIMAZOLE 1 % VA CREA
1.0000 | TOPICAL_CREAM | Freq: Two times a day (BID) | VAGINAL | Status: DC
  Administered 2022-05-16 – 2022-05-17 (×3): 1 via VAGINAL
  Filled 2022-05-16: qty 45

## 2022-05-16 MED ORDER — PRAVASTATIN SODIUM 10 MG PO TABS: 20.0000 mg | ORAL_TABLET | ORAL | Status: DC

## 2022-05-16 MED ORDER — SODIUM CHLORIDE 0.9 % IV SOLN: 250.0000 mL | INTRAVENOUS | Status: DC | PRN

## 2022-05-16 MED ORDER — HEPARIN (PORCINE) IN NACL 1000-0.9 UT/500ML-% IV SOLN
INTRAVENOUS | Status: DC | PRN
  Administered 2022-05-16 (×2): 500 mL

## 2022-05-16 MED ORDER — LIDOCAINE HCL (PF) 1 % IJ SOLN
INTRAMUSCULAR | Status: AC
  Filled 2022-05-16: qty 30

## 2022-05-16 MED ORDER — HEPARIN SODIUM (PORCINE) 1000 UNIT/ML IJ SOLN
INTRAMUSCULAR | Status: DC | PRN
  Administered 2022-05-16: 5000 [IU] via INTRAVENOUS

## 2022-05-16 MED ORDER — VERAPAMIL HCL 2.5 MG/ML IV SOLN
INTRAVENOUS | Status: AC
  Filled 2022-05-16: qty 2

## 2022-05-16 MED ORDER — LIDOCAINE HCL (PF) 1 % IJ SOLN
INTRAMUSCULAR | Status: DC | PRN
  Administered 2022-05-16: 2 mL

## 2022-05-16 MED ORDER — AMLODIPINE BESYLATE 5 MG PO TABS: 5.0000 mg | ORAL_TABLET | Freq: Every day | ORAL | Status: DC

## 2022-05-16 MED ORDER — VERAPAMIL HCL 2.5 MG/ML IV SOLN
INTRAVENOUS | Status: DC | PRN
  Administered 2022-05-16: 10 mL via INTRA_ARTERIAL

## 2022-05-16 MED ORDER — FUROSEMIDE 20 MG PO TABS
20.0000 mg | ORAL_TABLET | Freq: Every day | ORAL | Status: DC
  Administered 2022-05-16 – 2022-05-17 (×2): 20 mg via ORAL
  Filled 2022-05-16 (×2): qty 1

## 2022-05-16 MED ORDER — FENTANYL CITRATE (PF) 100 MCG/2ML IJ SOLN
INTRAMUSCULAR | Status: AC
  Filled 2022-05-16: qty 2

## 2022-05-16 MED ORDER — FENTANYL CITRATE (PF) 100 MCG/2ML IJ SOLN
INTRAMUSCULAR | Status: DC | PRN
  Administered 2022-05-16: 50 ug via INTRAVENOUS

## 2022-05-16 MED ORDER — IOHEXOL 350 MG/ML SOLN
INTRAVENOUS | Status: DC | PRN
  Administered 2022-05-16: 80 mL via INTRA_ARTERIAL

## 2022-05-16 MED ORDER — MIDAZOLAM HCL 2 MG/2ML IJ SOLN
INTRAMUSCULAR | Status: DC | PRN
  Administered 2022-05-16: 1 mg via INTRAVENOUS

## 2022-05-16 MED ORDER — CARVEDILOL 12.5 MG PO TABS
12.5000 mg | ORAL_TABLET | Freq: Two times a day (BID) | ORAL | Status: DC
  Administered 2022-05-16 – 2022-05-17 (×3): 12.5 mg via ORAL
  Filled 2022-05-16 (×3): qty 1

## 2022-05-16 MED ORDER — ALPRAZOLAM 0.25 MG PO TABS
0.2500 mg | ORAL_TABLET | Freq: Three times a day (TID) | ORAL | Status: DC | PRN
  Administered 2022-05-16: 0.25 mg via ORAL
  Filled 2022-05-16: qty 1

## 2022-05-16 MED ORDER — MELATONIN 5 MG PO TABS
5.0000 mg | ORAL_TABLET | Freq: Every day | ORAL | Status: DC
  Administered 2022-05-16: 5 mg via ORAL
  Filled 2022-05-16: qty 1

## 2022-05-16 MED ORDER — HEPARIN SODIUM (PORCINE) 1000 UNIT/ML IJ SOLN
INTRAMUSCULAR | Status: AC
  Filled 2022-05-16: qty 10

## 2022-05-16 MED ORDER — SODIUM CHLORIDE 0.9% FLUSH
3.0000 mL | Freq: Two times a day (BID) | INTRAVENOUS | Status: DC
  Administered 2022-05-17: 3 mL via INTRAVENOUS

## 2022-05-16 MED ORDER — FUROSEMIDE 20 MG PO TABS: 20.0000 mg | ORAL_TABLET | Freq: Every day | ORAL | Status: DC

## 2022-05-16 MED ORDER — POTASSIUM CHLORIDE CRYS ER 20 MEQ PO TBCR
60.0000 meq | EXTENDED_RELEASE_TABLET | Freq: Once | ORAL | Status: AC
  Administered 2022-05-16: 60 meq via ORAL
  Filled 2022-05-16: qty 3

## 2022-05-16 MED ORDER — AMLODIPINE BESYLATE 10 MG PO TABS
10.0000 mg | ORAL_TABLET | Freq: Every day | ORAL | Status: DC
  Administered 2022-05-16 – 2022-05-17 (×2): 10 mg via ORAL
  Filled 2022-05-16 (×2): qty 1

## 2022-05-16 MED ORDER — SODIUM CHLORIDE 0.9 % IV SOLN: INTRAVENOUS | Status: AC

## 2022-05-16 MED ORDER — MAGNESIUM SULFATE 4 GM/100ML IV SOLN
4.0000 g | Freq: Once | INTRAVENOUS | Status: AC
  Administered 2022-05-16: 4 g via INTRAVENOUS
  Filled 2022-05-16: qty 100

## 2022-05-16 MED ORDER — PANTOPRAZOLE SODIUM 40 MG PO TBEC: 40.0000 mg | DELAYED_RELEASE_TABLET | Freq: Every day | ORAL | Status: DC | PRN

## 2022-05-16 MED ORDER — GABAPENTIN 300 MG PO CAPS
300.0000 mg | ORAL_CAPSULE | Freq: Two times a day (BID) | ORAL | Status: DC
  Administered 2022-05-16 – 2022-05-17 (×3): 300 mg via ORAL
  Filled 2022-05-16 (×3): qty 1

## 2022-05-16 MED ORDER — MIDAZOLAM HCL 2 MG/2ML IJ SOLN
INTRAMUSCULAR | Status: AC
  Filled 2022-05-16: qty 2

## 2022-05-16 MED ORDER — FENOFIBRATE 160 MG PO TABS
160.0000 mg | ORAL_TABLET | Freq: Every day | ORAL | Status: DC
  Administered 2022-05-16 – 2022-05-17 (×2): 160 mg via ORAL
  Filled 2022-05-16 (×2): qty 1

## 2022-05-16 SURGICAL SUPPLY — 10 items
BAND ZEPHYR COMPRESS 30 LONG (HEMOSTASIS) IMPLANT
CATH INFINITI 5FR JK (CATHETERS) IMPLANT
CATH INFINITI JR4 5F (CATHETERS) IMPLANT
GLIDESHEATH SLEND SS 6F .021 (SHEATH) IMPLANT
GUIDEWIRE INQWIRE 1.5J.035X260 (WIRE) IMPLANT
INQWIRE 1.5J .035X260CM (WIRE) ×1
KIT HEART LEFT (KITS) ×1 IMPLANT
PACK CARDIAC CATHETERIZATION (CUSTOM PROCEDURE TRAY) ×1 IMPLANT
TRANSDUCER W/STOPCOCK (MISCELLANEOUS) ×1 IMPLANT
TUBING CIL FLEX 10 FLL-RA (TUBING) ×1 IMPLANT

## 2022-05-16 NOTE — Progress Notes (Addendum)
 Rounding Note    Patient Name: Gloria Sharp Date of Encounter: 05/16/2022  [facility] Cardiologist: Robert Krasowski, MD   Subjective   No chest pain this morning. Family at bedside.   Inpatient Medications    Scheduled Meds:  carvedilol  6.25 mg Oral BID WC   fenofibrate  160 mg Oral Daily   furosemide  20 mg Oral Daily   gabapentin  300 mg Oral BID   heparin  5,000 Units Subcutaneous Q8H   insulin aspart  0-15 Units Subcutaneous TID WC   irbesartan  150 mg Oral Daily   [START ON 05/20/2022] pravastatin  20 mg Oral Q Sun   sodium chloride flush  3 mL Intravenous Q12H   Continuous Infusions:  sodium chloride     sodium chloride 1 mL/kg/hr (05/16/22 1000)   nitroGLYCERIN 15 mcg/min (05/16/22 1000)   PRN Meds: sodium chloride, acetaminophen, ondansetron (ZOFRAN) IV, pantoprazole, sodium chloride flush   Vital Signs    Vitals:   05/16/22 0900 05/16/22 0930 05/16/22 1000 05/16/22 1030  BP: (!) 164/78 (!) 140/69 (!) 145/72   Pulse:      Resp: 14 17 17 18  Temp:      TempSrc:      SpO2:      Weight:      Height:        Intake/Output Summary (Last 24 hours) at 05/16/2022 1112 Last data filed at 05/16/2022 1000 Gross per 24 hour  Intake 481.66 ml  Output --  Net 481.66 ml      05/15/2022    5:56 PM 11/15/2021    1:17 PM 04/17/2021   12:29 PM  Last 3 Weights  Weight (lbs) 212 lb 4.9 oz 212 lb 214 lb  Weight (kg) 96.3 kg 96.163 kg 97.07 kg      Telemetry    Sinus Rhythm - Personally Reviewed  ECG    Sinus rhythm, 69 bpm, left bundle branch block, T wave inversion in lateral leads.- Personally Reviewed  Physical Exam   GEN: No acute distress.   Neck: No JVD Cardiac: RRR, 2/6 systolic murmur, no rubs, or gallops.  Respiratory: Clear to auscultation bilaterally. GI: Soft, nontender, non-distended  MS: No edema; No deformity. Neuro:  Nonfocal  Psych: Normal affect   Labs    High Sensitivity Troponin:  No results for input(s): "TROPONINIHS"  in the last 720 hours.   Chemistry Recent Labs  Lab 05/15/22 1830 05/16/22 0059  NA 141 137  K 3.3* 3.2*  CL 105 105  CO2 23 23  GLUCOSE 93 137*  BUN 14 14  CREATININE 0.81 0.88  CALCIUM 9.4 9.2  MG 1.1*  --   PROT 6.4*  --   ALBUMIN 3.7  --   AST 21  --   ALT 15  --   ALKPHOS 50  --   BILITOT 1.1  --   GFRNONAA >60 >60  ANIONGAP 13 9    Lipids  Recent Labs  Lab 05/16/22 0059  CHOL 127  TRIG 179*  HDL 38*  LDLCALC 53  CHOLHDL 3.3    Hematology Recent Labs  Lab 05/15/22 1830 05/16/22 0059  WBC 11.7* 14.2*  RBC 3.76* 3.71*  HGB 10.5* 10.4*  HCT 33.2* 31.9*  MCV 88.3 86.0  MCH 27.9 28.0  MCHC 31.6 32.6  RDW 14.6 14.6  PLT 460* 462*   Thyroid  Recent Labs  Lab 05/15/22 1830  TSH 1.085    BNPNo results for input(s): "BNP", "  PROBNP" in the last 168 hours.  DDimer No results for input(s): "DDIMER" in the last 168 hours.   Radiology    No results found.  Cardiac Studies   Echo: 11/2021  IMPRESSIONS     1. GLS -14.5. Left ventricular ejection fraction, by estimation, is 60 to  65%. The left ventricle has normal function. The left ventricle has no  regional wall motion abnormalities. There is mild left ventricular  hypertrophy. Left ventricular diastolic  parameters are indeterminate.   2. Right ventricular systolic function is normal. The right ventricular  size is normal. There is mildly elevated pulmonary artery systolic  pressure.   3. The mitral valve is normal in structure. Mild to moderate mitral valve  regurgitation. Mild mitral stenosis. The mean mitral valve gradient is 3.0  mmHg.   4. The aortic valve is calcified. There is mild calcification of the  aortic valve. There is mild thickening of the aortic valve. Aortic valve  regurgitation is not visualized. Mild to moderate aortic valve stenosis.  Aortic valve area, by VTI measures  1.44 cm. Aortic valve mean gradient measures 13.5 mmHg.   5. The inferior vena cava is normal in size  with greater than 50%  respiratory variability, suggesting right atrial pressure of 3 mmHg.   FINDINGS   Left Ventricle: GLS -14.5. Left ventricular ejection fraction, by  estimation, is 60 to 65%. The left ventricle has normal function. The left  ventricle has no regional wall motion abnormalities. The left ventricular  internal cavity size was normal in  size. There is mild left ventricular hypertrophy. Left ventricular  diastolic parameters are indeterminate.   Right Ventricle: The right ventricular size is normal. No increase in  right ventricular wall thickness. Right ventricular systolic function is  normal. There is mildly elevated pulmonary artery systolic pressure. The  tricuspid regurgitant velocity is 2.91   m/s, and with an assumed right atrial pressure of 8 mmHg, the estimated  right ventricular systolic pressure is 41.9 mmHg.   Left Atrium: Left atrial size was normal in size.   Right Atrium: Right atrial size was normal in size.   Pericardium: There is no evidence of pericardial effusion.   Mitral Valve: The mitral valve is normal in structure. There is mild  thickening of the mitral valve leaflet(s). There is moderate calcification  of the mitral valve leaflet(s). Mild mitral annular calcification. Mild to  moderate mitral valve  regurgitation. Mild mitral valve stenosis. MV peak gradient, 7.4 mmHg. The  mean mitral valve gradient is 3.0 mmHg.   Tricuspid Valve: The tricuspid valve is normal in structure. Tricuspid  valve regurgitation is mild . No evidence of tricuspid stenosis.   Aortic Valve: The aortic valve is calcified. There is mild calcification  of the aortic valve. There is mild thickening of the aortic valve. Aortic  valve regurgitation is not visualized. Mild to moderate aortic stenosis is  present. Aortic valve mean  gradient measures 13.5 mmHg. Aortic valve peak gradient measures 23.7  mmHg. Aortic valve area, by VTI measures 1.44 cm.   Pulmonic  Valve: The pulmonic valve was normal in structure. Pulmonic valve  regurgitation is not visualized. No evidence of pulmonic stenosis.   Aorta: The aortic root is normal in size and structure.   Venous: The inferior vena cava is normal in size with greater than 50%  respiratory variability, suggesting right atrial pressure of 3 mmHg.   IAS/Shunts: No atrial level shunt detected by color flow Doppler.   Patient   Profile     77 y.o. female  with PMH of aortic stenosis, type 2 diabetes, hypertension, hyperlipidemia, CVA,  NSVT, fibromyalgia, urolithiasis, GERD, who is transferred from [facility] to [facility] today for further evaluation of chest pain.   Assessment & Plan    Unstable angina  Abnormal stress test at outside facility  -- presented to [facility] for chest pain associated with exertion, DOE for 2 weeks, Trop 0.06 x1 -- Stress myvoeiw 4/29 showed  small reversible defect in the distal anterolateral wall suspicions for inducible ischemia,  fixed defect in the inferior and anteroseptal wall suspicious for infarction, moderate LV dilatation with inferior and lateral wall hypokinesis, LVEF 46%  --Transferred to [facility] for further evaluation with cardiac catheterization. -- heparin gtt was not started at [place], currently not indicated, HOLD PTA plavix 75mg daily (in case need CABG per Dr Zikeria Keough),  -- on admission, stopped PTA lisinopril and start irbesartan 150mg daily, stopped Toprolol XL 100mg daily and start Coreg 6.25mg BID -- continue pravastatin 20mg   HTN urgency -- BP was >200 systolic at [place], currently 196/91, received losartan and nitro paste at [place] -- BPs improved but still elevated -- continue irbesartan 150mg and coreg 6.25mg BID, continue PTA Lasix,  stopped PTA lisinopril and Toprol XL and HCTZ (on admission) -- Remains on IV nitro   Mild to moderate aortic stenosis Mild to moderate mitral regurgitation Mild mitral stenosis  --Repeat echo  pending   Type 2 DM -- Hgb A1c 5.9 -- Hold PTA metformin and Actos and ozempic --SSI while inpatient   HLD -- LDL 53, HDL 38 -- On pravastatin   Hx of CVA -- On Plavix PTA, continue statin  Hypomagnesemia --1.1, IV supp  Hypokalemia -- 3.2, supp   For questions or updates, please contact [facility] Please consult [email or web address] for contact info under        Signed, Lindsay Roberts, NP  05/16/2022, 11:12 AM     ATTENDING ATTESTATION:  After conducting a review of all available clinical information with the care team, interviewing the patient, and performing a physical exam, I agree with the findings and plan described in this note.   GEN: No acute distress.   HEENT:  MMM, no JVD, no scleral icterus Cardiac: RRR, no murmurs, rubs, or gallops.  Respiratory: Clear to auscultation bilaterally. GI: Soft, nontender, non-distended  MS: No edema; No deformity. Neuro:  Nonfocal  Vasc:  +2 radial pulses  Patient doing well this AM.  BP under better control with nitro gtt.  Remains chest pain free.  Hold chronic plavix for now; coronary angiography today for unstable angina.  Will need to intensify anti-HTN regimen following procedure.  I have reviewed the risks, indications, and alternatives to cardiac catheterization, possible angioplasty, and stenting with the patient. Risks include but are not limited to bleeding, infection, vascular injury, stroke, myocardial infection, arrhythmia, kidney injury, radiation-related injury in the case of prolonged fluoroscopy use, emergency cardiac surgery, and death. The patient understands the risks of serious complication is 1-2 in 1000 with diagnostic cardiac cath and 1-2% or less with angioplasty/stenting.    Alejandrina Raimer, MD Pager [phone]  

## 2022-05-16 NOTE — H&P (View-Only) (Signed)
Rounding Note    Patient Name: Gloria Sharp Date of Encounter: 05/16/2022  [facility] Cardiologist: Gypsy Balsam, MD   Subjective   No chest pain this morning. Family at bedside.   Inpatient Medications    Scheduled Meds:  carvedilol  6.25 mg Oral BID WC   fenofibrate  160 mg Oral Daily   furosemide  20 mg Oral Daily   gabapentin  300 mg Oral BID   heparin  5,000 Units Subcutaneous Q8H   insulin aspart  0-15 Units Subcutaneous TID WC   irbesartan  150 mg Oral Daily   [START ON 05/20/2022] pravastatin  20 mg Oral Q Sun   sodium chloride flush  3 mL Intravenous Q12H   Continuous Infusions:  sodium chloride     sodium chloride 1 mL/kg/hr (05/16/22 1000)   nitroGLYCERIN 15 mcg/min (05/16/22 1000)   PRN Meds: sodium chloride, acetaminophen, ondansetron (ZOFRAN) IV, pantoprazole, sodium chloride flush   Vital Signs    Vitals:   05/16/22 0900 05/16/22 0930 05/16/22 1000 05/16/22 1030  BP: (!) 164/78 (!) 140/69 (!) 145/72   Pulse:      Resp: 14 17 17 18   Temp:      TempSrc:      SpO2:      Weight:      Height:        Intake/Output Summary (Last 24 hours) at 05/16/2022 1112 Last data filed at 05/16/2022 1000 Gross per 24 hour  Intake 481.66 ml  Output --  Net 481.66 ml      05/15/2022    5:56 PM 11/15/2021    1:17 PM 04/17/2021   12:29 PM  Last 3 Weights  Weight (lbs) 212 lb 4.9 oz 212 lb 214 lb  Weight (kg) 96.3 kg 96.163 kg 97.07 kg      Telemetry    Sinus Rhythm - Personally Reviewed  ECG    Sinus rhythm, 69 bpm, left bundle branch block, T wave inversion in lateral leads.- Personally Reviewed  Physical Exam   GEN: No acute distress.   Neck: No JVD Cardiac: RRR, 2/6 systolic murmur, no rubs, or gallops.  Respiratory: Clear to auscultation bilaterally. GI: Soft, nontender, non-distended  MS: No edema; No deformity. Neuro:  Nonfocal  Psych: Normal affect   Labs    High Sensitivity Troponin:  No results for input(s): "TROPONINIHS"  in the last 720 hours.   Chemistry Recent Labs  Lab 05/15/22 1830 05/16/22 0059  NA 141 137  K 3.3* 3.2*  CL 105 105  CO2 23 23  GLUCOSE 93 137*  BUN 14 14  CREATININE 0.81 0.88  CALCIUM 9.4 9.2  MG 1.1*  --   PROT 6.4*  --   ALBUMIN 3.7  --   AST 21  --   ALT 15  --   ALKPHOS 50  --   BILITOT 1.1  --   GFRNONAA >60 >60  ANIONGAP 13 9    Lipids  Recent Labs  Lab 05/16/22 0059  CHOL 127  TRIG 179*  HDL 38*  LDLCALC 53  CHOLHDL 3.3    Hematology Recent Labs  Lab 05/15/22 1830 05/16/22 0059  WBC 11.7* 14.2*  RBC 3.76* 3.71*  HGB 10.5* 10.4*  HCT 33.2* 31.9*  MCV 88.3 86.0  MCH 27.9 28.0  MCHC 31.6 32.6  RDW 14.6 14.6  PLT 460* 462*   Thyroid  Recent Labs  Lab 05/15/22 1830  TSH 1.085    BNPNo results for input(s): "BNP", "  PROBNP" in the last 168 hours.  DDimer No results for input(s): "DDIMER" in the last 168 hours.   Radiology    No results found.  Cardiac Studies   Echo: 11/2021  IMPRESSIONS     1. GLS -14.5. Left ventricular ejection fraction, by estimation, is 60 to  65%. The left ventricle has normal function. The left ventricle has no  regional wall motion abnormalities. There is mild left ventricular  hypertrophy. Left ventricular diastolic  parameters are indeterminate.   2. Right ventricular systolic function is normal. The right ventricular  size is normal. There is mildly elevated pulmonary artery systolic  pressure.   3. The mitral valve is normal in structure. Mild to moderate mitral valve  regurgitation. Mild mitral stenosis. The mean mitral valve gradient is 3.0  mmHg.   4. The aortic valve is calcified. There is mild calcification of the  aortic valve. There is mild thickening of the aortic valve. Aortic valve  regurgitation is not visualized. Mild to moderate aortic valve stenosis.  Aortic valve area, by VTI measures  1.44 cm. Aortic valve mean gradient measures 13.5 mmHg.   5. The inferior vena cava is normal in size  with greater than 50%  respiratory variability, suggesting right atrial pressure of 3 mmHg.   FINDINGS   Left Ventricle: GLS -14.5. Left ventricular ejection fraction, by  estimation, is 60 to 65%. The left ventricle has normal function. The left  ventricle has no regional wall motion abnormalities. The left ventricular  internal cavity size was normal in  size. There is mild left ventricular hypertrophy. Left ventricular  diastolic parameters are indeterminate.   Right Ventricle: The right ventricular size is normal. No increase in  right ventricular wall thickness. Right ventricular systolic function is  normal. There is mildly elevated pulmonary artery systolic pressure. The  tricuspid regurgitant velocity is 2.91   m/s, and with an assumed right atrial pressure of 8 mmHg, the estimated  right ventricular systolic pressure is 41.9 mmHg.   Left Atrium: Left atrial size was normal in size.   Right Atrium: Right atrial size was normal in size.   Pericardium: There is no evidence of pericardial effusion.   Mitral Valve: The mitral valve is normal in structure. There is mild  thickening of the mitral valve leaflet(s). There is moderate calcification  of the mitral valve leaflet(s). Mild mitral annular calcification. Mild to  moderate mitral valve  regurgitation. Mild mitral valve stenosis. MV peak gradient, 7.4 mmHg. The  mean mitral valve gradient is 3.0 mmHg.   Tricuspid Valve: The tricuspid valve is normal in structure. Tricuspid  valve regurgitation is mild . No evidence of tricuspid stenosis.   Aortic Valve: The aortic valve is calcified. There is mild calcification  of the aortic valve. There is mild thickening of the aortic valve. Aortic  valve regurgitation is not visualized. Mild to moderate aortic stenosis is  present. Aortic valve mean  gradient measures 13.5 mmHg. Aortic valve peak gradient measures 23.7  mmHg. Aortic valve area, by VTI measures 1.44 cm.   Pulmonic  Valve: The pulmonic valve was normal in structure. Pulmonic valve  regurgitation is not visualized. No evidence of pulmonic stenosis.   Aorta: The aortic root is normal in size and structure.   Venous: The inferior vena cava is normal in size with greater than 50%  respiratory variability, suggesting right atrial pressure of 3 mmHg.   IAS/Shunts: No atrial level shunt detected by color flow Doppler.   Patient  Profile     77 y.o. female  with PMH of aortic stenosis, type 2 diabetes, hypertension, hyperlipidemia, CVA,  NSVT, fibromyalgia, urolithiasis, GERD, who is transferred from [facility] to [facility] today for further evaluation of chest pain.   Assessment & Plan    Unstable angina  Abnormal stress test at outside facility  -- presented to [facility] for chest pain associated with exertion, DOE for 2 weeks, Trop 0.06 x1 -- Stress myvoeiw 4/29 showed  small reversible defect in the distal anterolateral wall suspicions for inducible ischemia,  fixed defect in the inferior and anteroseptal wall suspicious for infarction, moderate LV dilatation with inferior and lateral wall hypokinesis, LVEF 46%  --Transferred to [facility] for further evaluation with cardiac catheterization. -- heparin gtt was not started at [facility], currently not indicated, HOLD PTA plavix 75mg  daily (in case need CABG per Dr Lynnette Caffey),  -- on admission, stopped PTA lisinopril and start irbesartan 150mg  daily, stopped Toprolol XL 100mg  daily and start Coreg 6.25mg  BID -- continue pravastatin 20mg    HTN urgency -- BP was >200 systolic at [place], currently 196/91, received losartan and nitro paste at [place] -- BPs improved but still elevated -- continue irbesartan 150mg  and coreg 6.25mg  BID, continue PTA Lasix,  stopped PTA lisinopril and Toprol XL and HCTZ (on admission) -- Remains on IV nitro   Mild to moderate aortic stenosis Mild to moderate mitral regurgitation Mild mitral stenosis  --Repeat echo  pending   Type 2 DM -- Hgb A1c 5.9 -- Hold PTA metformin and Actos and ozempic --SSI while inpatient   HLD -- LDL 53, HDL 38 -- On pravastatin   Hx of CVA -- On Plavix PTA, continue statin  Hypomagnesemia --1.1, IV supp  Hypokalemia -- 3.2, supp   For questions or updates, please contact [facility] Please consult [email or web address] for contact info under        Signed, Laverda Page, NP  05/16/2022, 11:12 AM     ATTENDING ATTESTATION:  After conducting a review of all available clinical information with the care team, interviewing the patient, and performing a physical exam, I agree with the findings and plan described in this note.   GEN: No acute distress.   HEENT:  MMM, no JVD, no scleral icterus Cardiac: RRR, no murmurs, rubs, or gallops.  Respiratory: Clear to auscultation bilaterally. GI: Soft, nontender, non-distended  MS: No edema; No deformity. Neuro:  Nonfocal  Vasc:  +2 radial pulses  Patient doing well this AM.  BP under better control with nitro gtt.  Remains chest pain free.  Hold chronic plavix for now; coronary angiography today for unstable angina.  Will need to intensify anti-HTN regimen following procedure.  I have reviewed the risks, indications, and alternatives to cardiac catheterization, possible angioplasty, and stenting with the patient. Risks include but are not limited to bleeding, infection, vascular injury, stroke, myocardial infection, arrhythmia, kidney injury, radiation-related injury in the case of prolonged fluoroscopy use, emergency cardiac surgery, and death. The patient understands the risks of serious complication is 1-2 in 1000 with diagnostic cardiac cath and 1-2% or less with angioplasty/stenting.    Alverda Skeans, MD Pager [phone]

## 2022-05-16 NOTE — Care Management Obs Status (Signed)
MEDICARE OBSERVATION STATUS NOTIFICATION   Patient Details  Name: Gloria Sharp MRN: [id number] Date of Birth: April 08, 1945   Medicare Observation Status Notification Given:  Yes    Leone Haven, RN 05/16/2022, 5:28 PM

## 2022-05-16 NOTE — Progress Notes (Signed)
  Transition of Care [facility]) Screening Note   Patient Details  Name: Gloria Sharp Date of Birth: September 16, 1945   Transition of Care [facility]) CM/SW Contact:    Leone Haven, RN Phone Number: 05/16/2022, 4:59 PM    Transition of Care Department [facility]) has reviewed , has PCP and insurance on file.  Presents with [place], HTN, for heart cath today.  We will continue to monitor patient advancement through interdisciplinary progression rounds. If new patient transition needs arise, please place a TOC consult.

## 2022-05-16 NOTE — Interval H&P Note (Signed)
History and Physical Interval Note:  05/16/2022 2:09 PM  Gloria Sharp  has presented today for surgery, with the diagnosis of abnormal stress test.  The various methods of treatment have been discussed with the patient and family. After consideration of risks, benefits and other options for treatment, the patient has consented to  Procedure(s): LEFT HEART CATH AND CORONARY ANGIOGRAPHY (N/A) as a surgical intervention.  The patient's history has been reviewed, patient examined, no change in status, stable for surgery.  I have reviewed the patient's chart and labs.  Questions were answered to the patient's satisfaction.     Lorine Bears

## 2022-05-17 ENCOUNTER — Other Ambulatory Visit: Payer: Self-pay

## 2022-05-17 ENCOUNTER — Observation Stay (HOSPITAL_BASED_OUTPATIENT_CLINIC_OR_DEPARTMENT_OTHER): Payer: Medicare HMO

## 2022-05-17 ENCOUNTER — Other Ambulatory Visit (HOSPITAL_COMMUNITY): Payer: Self-pay

## 2022-05-17 DIAGNOSIS — Z79899 Other long term (current) drug therapy: Secondary | ICD-10-CM | POA: Diagnosis not present

## 2022-05-17 DIAGNOSIS — R079 Chest pain, unspecified: Secondary | ICD-10-CM

## 2022-05-17 DIAGNOSIS — Z8673 Personal history of transient ischemic attack (TIA), and cerebral infarction without residual deficits: Secondary | ICD-10-CM | POA: Diagnosis not present

## 2022-05-17 DIAGNOSIS — Z7409 Other reduced mobility: Secondary | ICD-10-CM | POA: Diagnosis not present

## 2022-05-17 DIAGNOSIS — E119 Type 2 diabetes mellitus without complications: Secondary | ICD-10-CM | POA: Diagnosis not present

## 2022-05-17 DIAGNOSIS — I340 Nonrheumatic mitral (valve) insufficiency: Secondary | ICD-10-CM

## 2022-05-17 DIAGNOSIS — M549 Dorsalgia, unspecified: Secondary | ICD-10-CM | POA: Diagnosis not present

## 2022-05-17 DIAGNOSIS — I209 Angina pectoris, unspecified: Secondary | ICD-10-CM | POA: Diagnosis not present

## 2022-05-17 DIAGNOSIS — I350 Nonrheumatic aortic (valve) stenosis: Secondary | ICD-10-CM

## 2022-05-17 DIAGNOSIS — Z7984 Long term (current) use of oral hypoglycemic drugs: Secondary | ICD-10-CM | POA: Diagnosis not present

## 2022-05-17 DIAGNOSIS — I160 Hypertensive urgency: Secondary | ICD-10-CM | POA: Diagnosis not present

## 2022-05-17 DIAGNOSIS — I25720 Atherosclerosis of autologous artery coronary artery bypass graft(s) with unstable angina pectoris: Secondary | ICD-10-CM | POA: Diagnosis not present

## 2022-05-17 DIAGNOSIS — G8929 Other chronic pain: Secondary | ICD-10-CM | POA: Diagnosis not present

## 2022-05-17 DIAGNOSIS — I10 Essential (primary) hypertension: Secondary | ICD-10-CM | POA: Diagnosis not present

## 2022-05-17 LAB — GLUCOSE, CAPILLARY
Glucose-Capillary: 149 mg/dL — ABNORMAL HIGH (ref 70–99)
Glucose-Capillary: 121 mg/dL — ABNORMAL HIGH (ref 70–99)
Glucose-Capillary: 128 mg/dL — ABNORMAL HIGH (ref 70–99)

## 2022-05-17 LAB — ECHOCARDIOGRAM COMPLETE
AR max vel: 1.14 cm2
AV Area VTI: 1.1 cm2
AV Area mean vel: 1.02 cm2
AV Mean grad: 20.5 mmHg
AV Peak grad: 34.7 mmHg
Ao pk vel: 2.95 m/s
Area-P 1/2: 2.77 cm2
Height: 60 in
Radius: 0.4 cm
S' Lateral: 2.6 cm
Single Plane A4C EF: 60.2 %
Weight: 3396.85 oz

## 2022-05-17 LAB — BASIC METABOLIC PANEL
Anion gap: 9 (ref 5–15)
BUN: 20 mg/dL (ref 8–23)
CO2: 24 mmol/L (ref 22–32)
Calcium: 8.6 mg/dL — ABNORMAL LOW (ref 8.9–10.3)
Chloride: 103 mmol/L (ref 98–111)
Creatinine, Ser: 1.13 mg/dL — ABNORMAL HIGH (ref 0.44–1.00)
GFR, Estimated: 51 mL/min — ABNORMAL LOW (ref >60)
Glucose, Bld: 143 mg/dL — ABNORMAL HIGH (ref 70–99)
Potassium: 3.5 mmol/L (ref 3.5–5.1)
Sodium: 136 mmol/L (ref 135–145)

## 2022-05-17 LAB — MAGNESIUM: Magnesium: 1.7 mg/dL (ref 1.7–2.4)

## 2022-05-17 MED ORDER — CLOPIDOGREL BISULFATE 75 MG PO TABS
75.0000 mg | ORAL_TABLET | Freq: Every day | ORAL | 3 refills | Status: AC
  Filled 2022-05-17: qty 30, 30d supply, fill #0

## 2022-05-17 MED ORDER — FUROSEMIDE 20 MG PO TABS
20.0000 mg | ORAL_TABLET | Freq: Every day | ORAL | 3 refills | Status: DC
  Filled 2022-05-17: qty 30, 30d supply, fill #0

## 2022-05-17 MED ORDER — CLOPIDOGREL BISULFATE 75 MG PO TABS
75.0000 mg | ORAL_TABLET | Freq: Every day | ORAL | Status: DC
  Administered 2022-05-17: 75 mg via ORAL
  Filled 2022-05-17: qty 1

## 2022-05-17 MED ORDER — POTASSIUM CHLORIDE CRYS ER 20 MEQ PO TBCR
40.0000 meq | EXTENDED_RELEASE_TABLET | Freq: Once | ORAL | Status: AC
  Administered 2022-05-17: 40 meq via ORAL
  Filled 2022-05-17: qty 2

## 2022-05-17 MED ORDER — ATORVASTATIN CALCIUM 80 MG PO TABS
80.0000 mg | ORAL_TABLET | Freq: Every day | ORAL | 3 refills | Status: DC
  Filled 2022-05-17: qty 30, 30d supply, fill #0

## 2022-05-17 MED ORDER — IRBESARTAN 150 MG PO TABS
150.0000 mg | ORAL_TABLET | Freq: Every day | ORAL | 3 refills | Status: DC
  Filled 2022-05-17: qty 30, 30d supply, fill #0

## 2022-05-17 MED ORDER — POTASSIUM CHLORIDE CRYS ER 20 MEQ PO TBCR
40.0000 meq | EXTENDED_RELEASE_TABLET | Freq: Every day | ORAL | 0 refills | Status: DC
  Filled 2022-05-17: qty 30, 15d supply, fill #0

## 2022-05-17 MED ORDER — CARVEDILOL 12.5 MG PO TABS
12.5000 mg | ORAL_TABLET | Freq: Two times a day (BID) | ORAL | 3 refills | Status: DC
  Filled 2022-05-17: qty 60, 30d supply, fill #0

## 2022-05-17 MED ORDER — AMLODIPINE BESYLATE 10 MG PO TABS
10.0000 mg | ORAL_TABLET | Freq: Every day | ORAL | 3 refills | Status: DC
  Filled 2022-05-17: qty 30, 30d supply, fill #0

## 2022-05-17 NOTE — Evaluation (Signed)
Physical Therapy Evaluation Patient Details Name: Gloria Sharp MRN: [id number] DOB: [birth year] Today's Date: 05/17/2022  History of Present Illness  Pt is 77 year old presented to [facility] from [facility] on  05/15/22 for chest pain. Cardiac cath showed moderate disease and will be treated medically. PMH - chronic/rt leg pain, DM, HTN, CVA, aortic stenosis, fibromyalgia.  Clinical Impression  Pt doing well with mobility and no further PT needed.  Pt is limited by her chronic back pain that she is seeing a pain  specialist for. Ready for dc from PT standpoint.        Recommendations for follow up therapy are one component of a multi-disciplinary discharge planning process, led by the attending physician.  Recommendations may be updated based on patient status, additional functional criteria and insurance authorization.  Follow Up Recommendations       Assistance Recommended at Discharge PRN  Patient can return home with the following  Help with stairs or ramp for entrance    Equipment Recommendations None recommended by PT  Recommendations for Other Services       Functional Status Assessment Patient has not had a recent decline in their functional status     Precautions / Restrictions Precautions Precautions: Fall      Mobility  Bed Mobility               General bed mobility comments: Pt up in chair    Transfers Overall transfer level: Modified independent Equipment used: Rolling walker (2 wheels), Rollator (4 wheels)                    Ambulation/Gait Ambulation/Gait assistance: Supervision Gait Distance (Feet): 180 Feet Assistive device: Rollator (4 wheels) Gait Pattern/deviations: Step-through pattern, Decreased stride length, Antalgic, Trunk flexed Gait velocity: decr Gait velocity interpretation: 1.31 - 2.62 ft/sec, indicative of limited community ambulator   General Gait Details: supervision for safety.  Stairs            Wheelchair  Mobility    Modified Rankin (Stroke Patients Only)       Balance Overall balance assessment: Mild deficits observed, not formally tested                                           Pertinent Vitals/Pain Pain Assessment Pain Assessment: Faces Faces Pain Scale: Hurts even more Pain Location: rt LE Pain Descriptors / Indicators: Aching Pain Intervention(s): Limited activity within patient's tolerance, Monitored during session    Home Living Family/patient expects to be discharged to:: Private residence Living Arrangements: Spouse/significant other Available Help at Discharge: Family;Available 24 hours/day Type of Home: House Home Access: Ramped entrance       Home Layout: One level Home Equipment: Agricultural consultant (2 wheels);Rollator (4 wheels);Cane - single point      Prior Function Prior Level of Function : Independent/Modified Independent             Mobility Comments: Uses rollator in community, rolling walker in the home intermittently due to back pain       Hand Dominance   Dominant Hand: Right    Extremity/Trunk Assessment   Upper Extremity Assessment Upper Extremity Assessment: Overall WFL for tasks assessed    Lower Extremity Assessment Lower Extremity Assessment: Generalized weakness       Communication   Communication: No difficulties  Cognition Arousal/Alertness: Awake/alert Behavior During Therapy: [facility]  for tasks assessed/performed Overall Cognitive Status: Within Functional Limits for tasks assessed                                          General Comments General comments (skin integrity, edema, etc.): VSS on RA    Exercises     Assessment/Plan    PT Assessment Patient does not need any further PT services  PT Problem List         PT Treatment Interventions      PT Goals (Current goals can be found in the Care Plan section)  Acute Rehab PT Goals PT Goal Formulation: All assessment and  education complete, DC therapy    Frequency       Co-evaluation               AM-PAC PT "6 Clicks" Mobility  Outcome Measure Help needed turning from your back to your side while in a flat bed without using bedrails?: None Help needed moving from lying on your back to sitting on the side of a flat bed without using bedrails?: None Help needed moving to and from a bed to a chair (including a wheelchair)?: None Help needed standing up from a chair using your arms (e.g., wheelchair or bedside chair)?: None Help needed to walk in hospital room?: A Little Help needed climbing 3-5 steps with a railing? : A Little 6 Click Score: 22    End of Session   Activity Tolerance: Patient tolerated treatment well Patient left: in chair;with call bell/phone within reach;with family/visitor present Nurse Communication: Mobility status PT Visit Diagnosis: Pain Pain - Right/Left: Right Pain - part of body: Hip (back)    Time: 1415-1430 PT Time Calculation (min) (ACUTE ONLY): 15 min   Charges:   PT Evaluation $PT Eval Low Complexity: 1 Low          [facility] PT Acute Rehabilitation Services Office [phone]   Angelina Ok [facility] 05/17/2022, 2:47 PM

## 2022-05-17 NOTE — Progress Notes (Signed)
Approximately 1700--Pt discharged to home. At time of discharge, pt A&O x 4 and VSS. AVS discharge instructions provided to pt and pt's husband, at bedside. All questions answered with teach-back technique utilized. All PIVs removed. All pt belongings taken with pt--pt verified. TOC meds given to pt. Pt transported home with husband in personal vehicle. Telemetry discontinued and notified.

## 2022-05-17 NOTE — Progress Notes (Signed)
Echocardiogram 2D Echocardiogram has been performed.  Lauriel Helin Wynn Banker 05/17/2022, 8:59 AM

## 2022-05-17 NOTE — Discharge Summary (Addendum)
Discharge Summary    Patient ID: Gloria Sharp MRN: [id number]; DOB: 1945-09-12  Admit date: 05/15/2022 Discharge date: 05/17/2022  PCP:  Abner Greenspan, MD   [facility] Providers Cardiologist:  Gypsy Balsam, MD        Discharge Diagnoses    Principal Problem:   Angina pectoris [facility]    Diagnostic Studies/Procedures    05/17/22 TTE  IMPRESSIONS     1. Left ventricular ejection fraction, by estimation, is 60 to 65%. The  left ventricle has normal function. The left ventricle has no regional  wall motion abnormalities. There is mild left ventricular hypertrophy.  Left ventricular diastolic parameters  are consistent with Grade II diastolic dysfunction (pseudonormalization).  Elevated left ventricular end-diastolic pressure. The E/e' is 25.   2. Right ventricular systolic function is normal. The right ventricular  size is normal. Tricuspid regurgitation signal is inadequate for assessing  PA pressure.   3. Left atrial size was mildly dilated.   4. The mitral valve is abnormal. Mild mitral valve regurgitation. Mild  mitral stenosis. The mean mitral valve gradient is 5.0 mmHg with average  heart rate of 69 bpm. Moderate mitral annular calcification.   5. The aortic valve is tricuspid. There is moderate calcification of the  aortic valve. Aortic valve regurgitation is trivial. Moderate aortic valve  stenosis. Aortic valve area, by VTI measures 1.10 cm. Aortic valve mean  gradient measures 20.5 mmHg.  Aortic valve Vmax measures 2.94 m/s. Peak gradien tis 34.7 mmHG, DI is  0.39.   6. The inferior vena cava is dilated in size with <50% respiratory  variability, suggesting right atrial pressure of 15 mmHg.   Comparison(s): Changes from prior study are noted. 12/01/2021: LVEF  60-65%, mild to moderate MR, mild MS - mean gradient 3 mmHg, mild to  moderate AS - mean gradient 13.5 mmHg.   FINDINGS   Left Ventricle: Left ventricular ejection fraction, by  estimation, is 60  to 65%. The left ventricle has normal function. The left ventricle has no  regional wall motion abnormalities. The left ventricular internal cavity  size was normal in size. There is   mild left ventricular hypertrophy. Left ventricular diastolic parameters  are consistent with Grade II diastolic dysfunction (pseudonormalization).  Elevated left ventricular end-diastolic pressure. The E/e' is 25.   Right Ventricle: The right ventricular size is normal. No increase in  right ventricular wall thickness. Right ventricular systolic function is  normal. Tricuspid regurgitation signal is inadequate for assessing PA  pressure. The tricuspid regurgitant  velocity is 5.00 m/s, and with an assumed right atrial pressure of 15  mmHg, the estimated right ventricular systolic pressure is 115.0 mmHg.   Left Atrium: Left atrial size was mildly dilated.   Right Atrium: Right atrial size was normal in size.   Pericardium: There is no evidence of pericardial effusion.   Mitral Valve: The mitral valve is abnormal. Moderate mitral annular  calcification. Mild mitral valve regurgitation. Mild mitral valve  stenosis. The mean mitral valve gradient is 5.0 mmHg with average heart  rate of 69 bpm.   Tricuspid Valve: The tricuspid valve is grossly normal. Tricuspid valve  regurgitation is mild.   Aortic Valve: The aortic valve is tricuspid. There is moderate  calcification of the aortic valve. Aortic valve regurgitation is trivial.  Moderate aortic stenosis is present. Aortic valve mean gradient measures  20.5 mmHg. Aortic valve peak gradient  measures 34.7 mmHg. Aortic valve area, by VTI measures 1.10 cm.  Pulmonic Valve: The pulmonic valve was normal in structure. Pulmonic valve  regurgitation is not visualized.   Aorta: The aortic root and ascending aorta are structurally normal, with  no evidence of dilitation.   Venous: The inferior vena cava is dilated in size with less than  50%  respiratory variability, suggesting right atrial pressure of 15 mmHg.   IAS/Shunts: No atrial level shunt detected by color flow Doppler.   05/16/22 LHC    Dist Cx lesion is 65% stenosed.   Ost Cx to Prox Cx lesion is 30% stenosed.   Mid LAD lesion is 65% stenosed.   2nd Diag lesion is 40% stenosed.   Prox RCA-2 lesion is 20% stenosed.   Prox RCA-1 lesion is 30% stenosed.   The left ventricular systolic function is normal.   LV end diastolic pressure is mildly elevated.   The left ventricular ejection fraction is 55-65% by visual estimate.   1.  Borderline significant two-vessel coronary artery disease with 60 to 70% stenosis in the mid LAD and distal left circumflex. 2.  Normal LV systolic function mildly elevated left ventricular end-diastolic pressure. 3.  Mild aortic stenosis with peak to peak systolic gradient of 10 to 15 mmHg.   Recommendations: Recommend optimizing medical therapy and blood pressure control.  If she has refractory angina in spite of optimal medical therapy, PCI of the LAD can be considered but that will require a long stent and jailing a large second diagonal. I increased the dose of carvedilol to 12.5 mg twice daily and switch pravastatin to high-dose atorvastatin.  Diagnostic Dominance: Right    _____________   History of Present Illness     Gloria Sharp is a 77 y.o. female with PMH of aortic stenosis, type 2 diabetes, hypertension, hyperlipidemia, CVA, NSVT, fibromyalgia, urolithiasis, GERD, who was transferred from [facility] to [facility] for further evaluation of chest pain. At [facility], patient reported symptoms of chest pressure, dyspnea on exertion x2 weeks. Per EMS, she was found hypertensive with blood pressure 200/110 and tachycardic. At [facility], diagnostic workup revealed WBC 9900, hemoglobin 10.6 platelets 457k.  INR 1.2.  CMP with creatinine 0.7 GFR>60. ProBNP 2560.  Trop 0.07 x1. Chest x-ray revealed minimal right basilar  atelectasis.  EKG showed sinus rhythm 80bpm, LBBB. Stress Myoview from 05/14/22 revealed small reversible defect in the distal anterolateral wall suspicions for inducible ischemia,  fixed defect in the inferior and anteroseptal wall suspicious for infarction, moderate LV dilatation with inferior and lateral wall hypokinesis, LVEF 46%. Cardiology Dr Thelma Comp recommended the patient transferred to [facility] for heart cath. Therapeutic anticoagulation was felt not indicated at the time.   Hospital Course     Consultants: n/a   Unstable angina/CAD  Coronary angiography with moderate 2V disease and consensus opinion that medical therapy would be best given need for long segment stenting.  Will restart plavix 75mg  given ASA allergy/history of CVA.  Cont BB and atorvastatin.  Chest pain likely due to HTN.  Discharge today with cardiology follow up.  Cath findings and plan reviewed with patient and family by Dr. Lynnette Caffey who agree with plan.   HTN urgency  On day of discharge, patient off nitro gtt with BP much improved. She will discharge on irbesartan 150mg , coreg 12.5mg  BID, amlodipine 10mg .   Mild to moderate aortic stenosis Mild to moderate mitral regurgitation Mild mitral stenosis  Elevated RA pressure   TTE on 5/2 showed mild MR with mean gradient 5 mmHg. Moderate AS with VTI measures  1.10 cm. Aortic valve mean  gradient measures 20.5 mmHg. Aortic valve Vmax measures 2.94 m/s. Peak gradient is 34.7 mmHG, DI is 0.39. RA pressure with dilated IVC.  Given elevated RA pressure, will continue lasix 20mg  daily with K supplementation. Patient will need outpatient labs checked.    Type 2 DM  Hgb A1c 5.9 Resume PTA metformin and Actos and ozempic  HLD  LDL 53, HDL 38. Continue atorvastatin   Hx of CVA  Restart Plavix, continue statin  Chronic back pain  Patient with limited mobility 2/2 chronic back pain (sees pain specialist). Given mobility challenges, PT asked to see patient prior  to discharge. PT recommendation for assistance with stairs/ramps with fall precautions at discharge.      Did the patient have an acute coronary syndrome (MI, NSTEMI, STEMI, etc) this admission?:  No                               Did the patient have a percutaneous coronary intervention (stent / angioplasty)?:  No.          _____________  Discharge Vitals Blood pressure (!) 133/58, pulse 64, temperature 97.6 F (36.4 C), temperature source Oral, resp. rate 19, height 5' (1.524 m), weight 96.3 kg, SpO2 96 %.  Filed Weights   05/15/22 1756  Weight: 96.3 kg    Labs & Radiologic Studies    CBC Recent Labs    05/15/22 1830 05/16/22 0059  WBC 11.7* 14.2*  HGB 10.5* 10.4*  HCT 33.2* 31.9*  MCV 88.3 86.0  PLT 460* 462*   Basic Metabolic Panel Recent Labs    16/10/96 1830 05/16/22 0059 05/17/22 0107  NA 141 137 136  K 3.3* 3.2* 3.5  CL 105 105 103  CO2 23 23 24   GLUCOSE 93 137* 143*  BUN 14 14 20   CREATININE 0.81 0.88 1.13*  CALCIUM 9.4 9.2 8.6*  MG 1.1*  --  1.7   Liver Function Tests Recent Labs    05/15/22 1830  AST 21  ALT 15  ALKPHOS 50  BILITOT 1.1  PROT 6.4*  ALBUMIN 3.7   No results for input(s): "LIPASE", "AMYLASE" in the last 72 hours. High Sensitivity Troponin:   No results for input(s): "TROPONINIHS" in the last 720 hours.  BNP Invalid input(s): "POCBNP" D-Dimer No results for input(s): "DDIMER" in the last 72 hours. Hemoglobin A1C Recent Labs    05/15/22 1830  HGBA1C 5.9*   Fasting Lipid Panel Recent Labs    05/16/22 0059  CHOL 127  HDL 38*  LDLCALC 53  TRIG 045*  CHOLHDL 3.3   Thyroid Function Tests Recent Labs    05/15/22 1830  TSH 1.085   _____________  ECHOCARDIOGRAM COMPLETE  Result Date: 05/17/2022    ECHOCARDIOGRAM REPORT   Patient Name:   TAIDE TAFUR Date of Exam: 05/17/2022 Medical Rec #:  [id number]     Height:       60.0 in Accession #:    [id number]    Weight:       212.3 lb Date of Birth:  1945/06/20     BSA:           1.915 m Patient Age:    77 years      BP:           130/66 mmHg Patient Gender: F             HR:  72 bpm. Exam Location:  Inpatient Procedure: 2D Echo, Cardiac Doppler and Color Doppler Indications:    Chest pain  History:        Patient has prior history of Echocardiogram examinations, most                 recent 12/01/2021. Risk Factors:Hypertension.  Sonographer:    Lucy Antigua Referring Phys: Cyndi Bender IMPRESSIONS  1. Left ventricular ejection fraction, by estimation, is 60 to 65%. The left ventricle has normal function. The left ventricle has no regional wall motion abnormalities. There is mild left ventricular hypertrophy. Left ventricular diastolic parameters are consistent with Grade II diastolic dysfunction (pseudonormalization). Elevated left ventricular end-diastolic pressure. The E/e' is 25.  2. Right ventricular systolic function is normal. The right ventricular size is normal. Tricuspid regurgitation signal is inadequate for assessing PA pressure.  3. Left atrial size was mildly dilated.  4. The mitral valve is abnormal. Mild mitral valve regurgitation. Mild mitral stenosis. The mean mitral valve gradient is 5.0 mmHg with average heart rate of 69 bpm. Moderate mitral annular calcification.  5. The aortic valve is tricuspid. There is moderate calcification of the aortic valve. Aortic valve regurgitation is trivial. Moderate aortic valve stenosis. Aortic valve area, by VTI measures 1.10 cm. Aortic valve mean gradient measures 20.5 mmHg. Aortic valve Vmax measures 2.94 m/s. Peak gradien tis 34.7 mmHG, DI is 0.39.  6. The inferior vena cava is dilated in size with <50% respiratory variability, suggesting right atrial pressure of 15 mmHg. Comparison(s): Changes from prior study are noted. 12/01/2021: LVEF 60-65%, mild to moderate MR, mild MS - mean gradient 3 mmHg, mild to moderate AS - mean gradient 13.5 mmHg. FINDINGS  Left Ventricle: Left ventricular ejection fraction, by estimation,  is 60 to 65%. The left ventricle has normal function. The left ventricle has no regional wall motion abnormalities. The left ventricular internal cavity size was normal in size. There is  mild left ventricular hypertrophy. Left ventricular diastolic parameters are consistent with Grade II diastolic dysfunction (pseudonormalization). Elevated left ventricular end-diastolic pressure. The E/e' is 25. Right Ventricle: The right ventricular size is normal. No increase in right ventricular wall thickness. Right ventricular systolic function is normal. Tricuspid regurgitation signal is inadequate for assessing PA pressure. The tricuspid regurgitant velocity is 5.00 m/s, and with an assumed right atrial pressure of 15 mmHg, the estimated right ventricular systolic pressure is 115.0 mmHg. Left Atrium: Left atrial size was mildly dilated. Right Atrium: Right atrial size was normal in size. Pericardium: There is no evidence of pericardial effusion. Mitral Valve: The mitral valve is abnormal. Moderate mitral annular calcification. Mild mitral valve regurgitation. Mild mitral valve stenosis. The mean mitral valve gradient is 5.0 mmHg with average heart rate of 69 bpm. Tricuspid Valve: The tricuspid valve is grossly normal. Tricuspid valve regurgitation is mild. Aortic Valve: The aortic valve is tricuspid. There is moderate calcification of the aortic valve. Aortic valve regurgitation is trivial. Moderate aortic stenosis is present. Aortic valve mean gradient measures 20.5 mmHg. Aortic valve peak gradient measures 34.7 mmHg. Aortic valve area, by VTI measures 1.10 cm. Pulmonic Valve: The pulmonic valve was normal in structure. Pulmonic valve regurgitation is not visualized. Aorta: The aortic root and ascending aorta are structurally normal, with no evidence of dilitation. Venous: The inferior vena cava is dilated in size with less than 50% respiratory variability, suggesting right atrial pressure of 15 mmHg. IAS/Shunts: No  atrial level shunt detected by color flow Doppler.  LEFT VENTRICLE  PLAX 2D LVIDd:         3.40 cm     Diastology LVIDs:         2.60 cm     LV e' medial:    6.64 cm/s LV PW:         1.20 cm     LV E/e' medial:  25.0 LV IVS:        1.20 cm     LV e' lateral:   6.20 cm/s LVOT diam:     1.90 cm     LV E/e' lateral: 26.8 LV SV:         77 LV SV Index:   40 LVOT Area:     2.84 cm  LV Volumes (MOD) LV vol d, MOD A4C: 80.9 ml LV vol s, MOD A4C: 32.2 ml LV SV MOD A4C:     80.9 ml RIGHT VENTRICLE             IVC RV S prime:     15.40 cm/s  IVC diam: 2.50 cm TAPSE (M-mode): 2.8 cm LEFT ATRIUM             Index        RIGHT ATRIUM           Index LA Vol (A2C):   72.8 ml 38.02 ml/m  RA Area:     14.70 cm LA Vol (A4C):   60.8 ml 31.76 ml/m  RA Volume:   43.40 ml  22.67 ml/m LA Biplane Vol: 68.2 ml 35.62 ml/m  AORTIC VALVE AV Area (Vmax):    1.14 cm AV Area (Vmean):   1.02 cm AV Area (VTI):     1.10 cm AV Vmax:           294.50 cm/s AV Vmean:          212.000 cm/s AV VTI:            0.704 m AV Peak Grad:      34.7 mmHg AV Mean Grad:      20.5 mmHg LVOT Vmax:         118.00 cm/s LVOT Vmean:        76.500 cm/s LVOT VTI:          0.273 m LVOT/AV VTI ratio: 0.39  AORTA Ao Root diam: 2.90 cm Ao Asc diam:  2.70 cm MITRAL VALVE                TRICUSPID VALVE MV Area (PHT): 2.77 cm     TR Peak grad:   100.0 mmHg MV Mean grad:  5.0 mmHg     TR Vmax:        500.00 cm/s MV Decel Time: 274 msec MR PISA:        1.01 cm    SHUNTS MR PISA Radius: 0.40 cm     Systemic VTI:  0.27 m MV E velocity: 166.00 cm/s  Systemic Diam: 1.90 cm MV A velocity: 133.00 cm/s MV E/A ratio:  1.25 Zoila Shutter MD Electronically signed by Zoila Shutter MD Signature Date/Time: 05/17/2022/11:10:55 AM    Final    CARDIAC CATHETERIZATION  Result Date: 05/16/2022   Dist Cx lesion is 65% stenosed.   Ost Cx to Prox Cx lesion is 30% stenosed.   Mid LAD lesion is 65% stenosed.   2nd Diag lesion is 40% stenosed.   Prox RCA-2 lesion is 20% stenosed.   Prox RCA-1  lesion is 30% stenosed.   The left ventricular  systolic function is normal.   LV end diastolic pressure is mildly elevated.   The left ventricular ejection fraction is 55-65% by visual estimate. 1.  Borderline significant two-vessel coronary artery disease with 60 to 70% stenosis in the mid LAD and distal left circumflex. 2.  Normal LV systolic function mildly elevated left ventricular end-diastolic pressure. 3.  Mild aortic stenosis with peak to peak systolic gradient of 10 to 15 mmHg. Recommendations: Recommend optimizing medical therapy and blood pressure control.  If she has refractory angina in spite of optimal medical therapy, PCI of the LAD can be considered but that will require a long stent and jailing a large second diagonal. I increased the dose of carvedilol to 12.5 mg twice daily and switch pravastatin to high-dose atorvastatin.   Disposition   Pt is being discharged home today in good condition.  Follow-up Plans & Appointments     Discharge Instructions     (HEART FAILURE PATIENTS) Call MD:  Anytime you have any of the following symptoms: 1) 3 pound weight gain in 24 hours or 5 pounds in 1 week 2) shortness of breath, with or without a dry hacking cough 3) swelling in the hands, feet or stomach 4) if you have to sleep on extra pillows at night in order to breathe.   Complete by: As directed    Call MD for:  difficulty breathing, headache or visual disturbances   Complete by: As directed    Diet - low sodium heart healthy   Complete by: As directed    Discharge instructions   Complete by: As directed    Your follow up appointment has been made, 5/9 at 8:45 with Wallis Bamberg, NP. You will be contacted to have labs checked prior to this visit. Please take all medications as prescribed.   Increase activity slowly   Complete by: As directed         Discharge Medications   Allergies as of 05/17/2022       Reactions   Celebrex [celecoxib] Itching, Swelling   Swelling  throughout body Skin redness    Keflex [cephalexin] Itching   Relafen [nabumetone] Itching, Swelling   Cipro [ciprofloxacin Hcl] Hives, Itching, Rash   Flagyl [metronidazole] Itching, Swelling, Rash        Medication List     STOP taking these medications    lisinopril 20 MG tablet Commonly known as: ZESTRIL   metoprolol succinate 100 MG 24 hr tablet Commonly known as: TOPROL-XL   pravastatin 20 MG tablet Commonly known as: PRAVACHOL       TAKE these medications    acetaminophen 650 MG CR tablet Commonly known as: TYLENOL Take 650 mg by mouth daily as needed for pain.   alendronate 35 MG tablet Commonly known as: FOSAMAX Take 35 mg by mouth every Monday.   amLODipine 10 MG tablet Commonly known as: NORVASC Take 1 tablet (10 mg total) by mouth daily. Start taking on: May 18, 2022   atorvastatin 80 MG tablet Commonly known as: LIPITOR Take 1 tablet (80 mg total) by mouth daily. Start taking on: May 18, 2022   brimonidine 0.15 % ophthalmic solution Commonly known as: ALPHAGAN Place 1 drop into both eyes 2 (two) times daily.   bupivacaine 0.5 % injection Commonly known as: MARCAINE 0.5 mLs by Other route once. Pain lumbar medial branch block   carvedilol 12.5 MG tablet Commonly known as: COREG Take 1 tablet (12.5 mg total) by mouth 2 (two) times daily with a meal.  cephALEXin 500 MG capsule Commonly known as: KEFLEX Take 500 mg by mouth 3 (three) times daily. 7 day course.   cholestyramine 4 g packet Commonly known as: Questran Take 1 packet (4 g total) by mouth daily. Take at least 2 hours before or after rest of the medications What changed:  when to take this additional instructions   clopidogrel 75 MG tablet Commonly known as: PLAVIX Take 1 tablet (75 mg total) by mouth daily. Start taking on: May 18, 2022   Dermacloud Oint Apply 1 application  topically 2 (two) times daily as needed (itching).   diclofenac Sodium 1 % Gel Commonly known as:  VOLTAREN Apply 2-4 g topically at bedtime as needed (knee pain).   fenofibrate 145 MG tablet Commonly known as: TRICOR Take 145 mg by mouth daily.   ferrous sulfate 325 (65 FE) MG EC tablet Take 325 mg by mouth daily in the afternoon.   FISH OIL PO Take 1 capsule by mouth daily in the afternoon.   furosemide 20 MG tablet Commonly known as: LASIX Take 1 tablet (20 mg total) by mouth daily.   gabapentin 300 MG capsule Commonly known as: NEURONTIN Take 300 mg by mouth 3 (three) times daily.   irbesartan 150 MG tablet Commonly known as: AVAPRO Take 1 tablet (150 mg total) by mouth daily. Start taking on: May 18, 2022   MAGNESIUM PO Take 1 tablet by mouth daily in the afternoon.   metFORMIN 1000 MG tablet Commonly known as: GLUCOPHAGE Take 1,000 mg by mouth 2 (two) times daily with a meal.   OVER THE COUNTER MEDICATION Apply 1-4 patches topically daily as needed (back pain). Coralite pain patch   pantoprazole 40 MG tablet Commonly known as: PROTONIX Take 40 mg by mouth daily.   pioglitazone 15 MG tablet Commonly known as: ACTOS Take 15 mg by mouth daily.   potassium chloride SA 20 MEQ tablet Commonly known as: KLOR-CON M Take 2 tablets (40 mEq total) by mouth daily.   tiZANidine 2 MG tablet Commonly known as: ZANAFLEX Take 2 mg by mouth every 6 (six) hours as needed for muscle spasms.   TURMERIC PO Take 1 tablet by mouth daily in the afternoon.   VITAMIN B-12 PO Take 1 tablet by mouth daily in the afternoon.           Outstanding Labs/Studies     Duration of Discharge Encounter   Greater than 30 minutes including physician time.  Signed, Perlie Gold, PA-C 05/17/2022, 3:05 PM   ATTENDING ATTESTATION:  After conducting a review of all available clinical information with the care team, interviewing the patient, and performing a physical exam, I agree with the findings and plan described in this note.   GEN: No acute distress.   HEENT:  MMM, no  JVD, no scleral icterus Cardiac: RRR, no murmurs, rubs, or gallops.  Respiratory: Clear to auscultation bilaterally. GI: Soft, nontender, non-distended  MS: No edema; No deformity. Neuro:  Nonfocal  Vasc:  R radial dressing  Patient doing well after improved BP control.  Cor angio with moderate 2V disease and reassuring LVEDP.  Discharge today on augmented BP regimen with close cardiology follow up.  Alverda Skeans, MD Pager [phone]

## 2022-05-17 NOTE — Plan of Care (Signed)
Problem: Education: Goal: Knowledge of General Education information will improve Description: Including pain rating scale, medication(s)/side effects and non-pharmacologic comfort measures 05/17/2022 1542 by Mathis Fare, RN Outcome: Adequate for Discharge 05/17/2022 1121 by Mathis Fare, RN Outcome: Progressing   Problem: Health Behavior/Discharge Planning: Goal: Ability to manage health-related needs will improve 05/17/2022 1542 by Mathis Fare, RN Outcome: Adequate for Discharge 05/17/2022 1121 by Mathis Fare, RN Outcome: Progressing   Problem: Clinical Measurements: Goal: Ability to maintain clinical measurements within normal limits will improve 05/17/2022 1542 by Mathis Fare, RN Outcome: Adequate for Discharge 05/17/2022 1121 by Mathis Fare, RN Outcome: Progressing Goal: Will remain free from infection 05/17/2022 1542 by Mathis Fare, RN Outcome: Adequate for Discharge 05/17/2022 1121 by Mathis Fare, RN Outcome: Progressing Goal: Diagnostic test results will improve 05/17/2022 1542 by Mathis Fare, RN Outcome: Adequate for Discharge 05/17/2022 1121 by Mathis Fare, RN Outcome: Progressing Goal: Respiratory complications will improve 05/17/2022 1542 by Mathis Fare, RN Outcome: Adequate for Discharge 05/17/2022 1121 by Mathis Fare, RN Outcome: Progressing Goal: Cardiovascular complication will be avoided 05/17/2022 1542 by Mathis Fare, RN Outcome: Adequate for Discharge 05/17/2022 1121 by Mathis Fare, RN Outcome: Progressing   Problem: Activity: Goal: Risk for activity intolerance will decrease 05/17/2022 1542 by Mathis Fare, RN Outcome: Adequate for Discharge 05/17/2022 1121 by Mathis Fare, RN Outcome: Progressing   Problem: Nutrition: Goal: Adequate nutrition will be maintained 05/17/2022 1542 by Mathis Fare, RN Outcome: Adequate for Discharge 05/17/2022 1121 by Mathis Fare, RN Outcome: Progressing    Problem: Coping: Goal: Level of anxiety will decrease 05/17/2022 1542 by Mathis Fare, RN Outcome: Adequate for Discharge 05/17/2022 1121 by Mathis Fare, RN Outcome: Progressing   Problem: Elimination: Goal: Will not experience complications related to bowel motility 05/17/2022 1542 by Mathis Fare, RN Outcome: Adequate for Discharge 05/17/2022 1121 by Mathis Fare, RN Outcome: Progressing Goal: Will not experience complications related to urinary retention 05/17/2022 1542 by Mathis Fare, RN Outcome: Adequate for Discharge 05/17/2022 1121 by Mathis Fare, RN Outcome: Progressing   Problem: Pain Managment: Goal: General experience of comfort will improve 05/17/2022 1542 by Mathis Fare, RN Outcome: Adequate for Discharge 05/17/2022 1121 by Mathis Fare, RN Outcome: Progressing   Problem: Safety: Goal: Ability to remain free from injury will improve Outcome: Adequate for Discharge   Problem: Skin Integrity: Goal: Risk for impaired skin integrity will decrease Outcome: Adequate for Discharge   Problem: Education: Goal: Understanding of cardiac disease, CV risk reduction, and recovery process will improve Outcome: Adequate for Discharge Goal: Individualized Educational Video(s) Outcome: Adequate for Discharge   Problem: Activity: Goal: Ability to tolerate increased activity will improve Outcome: Adequate for Discharge   Problem: Cardiac: Goal: Ability to achieve and maintain adequate cardiovascular perfusion will improve Outcome: Adequate for Discharge   Problem: Health Behavior/Discharge Planning: Goal: Ability to safely manage health-related needs after discharge will improve Outcome: Adequate for Discharge   Problem: Education: Goal: Ability to describe self-care measures that may prevent or decrease complications (Diabetes Survival Skills Education) will improve Outcome: Adequate for Discharge Goal: Individualized Educational  Video(s) Outcome: Adequate for Discharge   Problem: Coping: Goal: Ability to adjust to condition or change in health will improve Outcome: Adequate for Discharge   Problem: Fluid Volume: Goal: Ability to maintain a balanced intake and output will improve Outcome: Adequate for Discharge   Problem: Health Behavior/Discharge Planning: Goal: Ability to identify and utilize available resources and services will improve Outcome: Adequate for Discharge Goal: Ability to manage health-related needs will improve Outcome: Adequate  for Discharge   Problem: Metabolic: Goal: Ability to maintain appropriate glucose levels will improve Outcome: Adequate for Discharge   Problem: Nutritional: Goal: Maintenance of adequate nutrition will improve Outcome: Adequate for Discharge Goal: Progress toward achieving an optimal weight will improve Outcome: Adequate for Discharge   Problem: Skin Integrity: Goal: Risk for impaired skin integrity will decrease Outcome: Adequate for Discharge   Problem: Tissue Perfusion: Goal: Adequacy of tissue perfusion will improve Outcome: Adequate for Discharge   Problem: Education: Goal: Understanding of CV disease, CV risk reduction, and recovery process will improve Outcome: Adequate for Discharge Goal: Individualized Educational Video(s) Outcome: Adequate for Discharge   Problem: Activity: Goal: Ability to return to baseline activity level will improve Outcome: Adequate for Discharge   Problem: Cardiovascular: Goal: Ability to achieve and maintain adequate cardiovascular perfusion will improve Outcome: Adequate for Discharge Goal: Vascular access site(s) Level 0-1 will be maintained Outcome: Adequate for Discharge   Problem: Health Behavior/Discharge Planning: Goal: Ability to safely manage health-related needs after discharge will improve Outcome: Adequate for Discharge

## 2022-05-17 NOTE — TOC Transition Note (Signed)
Transition of Care [facility]) - CM/SW Discharge Note   Patient Details  Name: Gloria Sharp MRN: [id number] Date of Birth: 1945-02-28  Transition of [facility]) CM/SW Contact:  Leone Haven, RN Phone Number: 05/17/2022, 3:38 PM   Clinical Narrative:    Patient is for dc today, spouse states she does not need any HH services or any DME, he is transporting her home.  TOC to fill meds.          Patient Goals and CMS Choice      Discharge Placement                         Discharge Plan and Services Additional resources added to the After Visit Summary for                                       Social Determinants of Health (SDOH) Interventions SDOH Screenings   Food Insecurity: No Food Insecurity (05/15/2022)  Housing: Low Risk  (05/15/2022)  Transportation Needs: No Transportation Needs (05/15/2022)  Utilities: Not At Risk (05/15/2022)  Tobacco Use: Low Risk  (05/17/2022)     Readmission Risk Interventions     No data to display

## 2022-05-17 NOTE — Progress Notes (Signed)
Rounding Note    Patient Name: Gloria Sharp Date of Encounter: 05/17/2022  [facility] Cardiologist: Gypsy Balsam, MD   Subjective   Cath yesterday with moderate disease>>med rx  Nitro gtt weaned off and amlodipine increased to 10mg  with improved BP control  Inpatient Medications    Scheduled Meds:  amLODipine  10 mg Oral Daily   atorvastatin  80 mg Oral Daily   carvedilol  12.5 mg Oral BID WC   clopidogrel  75 mg Oral Daily   clotrimazole  1 Applicatorful Vaginal BID   fenofibrate  160 mg Oral Daily   furosemide  20 mg Oral Daily   gabapentin  300 mg Oral BID   heparin  5,000 Units Subcutaneous Q8H   insulin aspart  0-15 Units Subcutaneous TID WC   irbesartan  150 mg Oral Daily   melatonin  5 mg Oral QHS   sodium chloride flush  3 mL Intravenous Q12H   Continuous Infusions:  sodium chloride     PRN Meds: sodium chloride, acetaminophen, ALPRAZolam, ondansetron (ZOFRAN) IV, pantoprazole, sodium chloride flush   Vital Signs    Vitals:   05/17/22 0500 05/17/22 0600 05/17/22 0727 05/17/22 0821  BP: (!) 102/51 (!) 136/56 130/66 (!) 112/54  Pulse:  88 74   Resp: (!) 21 18 17    Temp:  98.1 F (36.7 C) 97.7 F (36.5 C)   TempSrc:  Oral Oral   SpO2:  98% 92%   Weight:      Height:        Intake/Output Summary (Last 24 hours) at 05/17/2022 1027 Last data filed at 05/16/2022 1800 Gross per 24 hour  Intake 1203.72 ml  Output 800 ml  Net 403.72 ml      05/15/2022    5:56 PM 11/15/2021    1:17 PM 04/17/2021   12:29 PM  Last 3 Weights  Weight (lbs) 212 lb 4.9 oz 212 lb 214 lb  Weight (kg) 96.3 kg 96.163 kg 97.07 kg      Telemetry    Sinus Rhythm - Personally Reviewed  ECG    Sinus rhythm, 69 bpm, left bundle branch block, T wave inversion in lateral leads.- Personally Reviewed  Physical Exam   GEN: No acute distress.   Neck: No JVD Cardiac: RRR, 2/6 systolic murmur, no rubs, or gallops.  Respiratory: Clear to auscultation bilaterally. GI:  Soft, nontender, non-distended  MS: No edema; No deformity. Neuro:  Nonfocal  Psych: Normal affect   Labs    High Sensitivity Troponin:  No results for input(s): "TROPONINIHS" in the last 720 hours.   Chemistry Recent Labs  Lab 05/15/22 1830 05/16/22 0059 05/17/22 0107  NA 141 137 136  K 3.3* 3.2* 3.5  CL 105 105 103  CO2 23 23 24   GLUCOSE 93 137* 143*  BUN 14 14 20   CREATININE 0.81 0.88 1.13*  CALCIUM 9.4 9.2 8.6*  MG 1.1*  --  1.7  PROT 6.4*  --   --   ALBUMIN 3.7  --   --   AST 21  --   --   ALT 15  --   --   ALKPHOS 50  --   --   BILITOT 1.1  --   --   GFRNONAA >60 >60 51*  ANIONGAP 13 9 9     Lipids  Recent Labs  Lab 05/16/22 0059  CHOL 127  TRIG 179*  HDL 38*  LDLCALC 53  CHOLHDL 3.3    Hematology Recent  Labs  Lab 05/15/22 1830 05/16/22 0059  WBC 11.7* 14.2*  RBC 3.76* 3.71*  HGB 10.5* 10.4*  HCT 33.2* 31.9*  MCV 88.3 86.0  MCH 27.9 28.0  MCHC 31.6 32.6  RDW 14.6 14.6  PLT 460* 462*   Thyroid  Recent Labs  Lab 05/15/22 1830  TSH 1.085    BNPNo results for input(s): "BNP", "PROBNP" in the last 168 hours.  DDimer No results for input(s): "DDIMER" in the last 168 hours.   Radiology    CARDIAC CATHETERIZATION  Result Date: 05/16/2022   Dist Cx lesion is 65% stenosed.   Ost Cx to Prox Cx lesion is 30% stenosed.   Mid LAD lesion is 65% stenosed.   2nd Diag lesion is 40% stenosed.   Prox RCA-2 lesion is 20% stenosed.   Prox RCA-1 lesion is 30% stenosed.   The left ventricular systolic function is normal.   LV end diastolic pressure is mildly elevated.   The left ventricular ejection fraction is 55-65% by visual estimate. 1.  Borderline significant two-vessel coronary artery disease with 60 to 70% stenosis in the mid LAD and distal left circumflex. 2.  Normal LV systolic function mildly elevated left ventricular end-diastolic pressure. 3.  Mild aortic stenosis with peak to peak systolic gradient of 10 to 15 mmHg. Recommendations: Recommend optimizing  medical therapy and blood pressure control.  If she has refractory angina in spite of optimal medical therapy, PCI of the LAD can be considered but that will require a long stent and jailing a large second diagonal. I increased the dose of carvedilol to 12.5 mg twice daily and switch pravastatin to high-dose atorvastatin.    Cardiac Studies   Echo: 11/2021  IMPRESSIONS     1. GLS -14.5. Left ventricular ejection fraction, by estimation, is 60 to  65%. The left ventricle has normal function. The left ventricle has no  regional wall motion abnormalities. There is mild left ventricular  hypertrophy. Left ventricular diastolic  parameters are indeterminate.   2. Right ventricular systolic function is normal. The right ventricular  size is normal. There is mildly elevated pulmonary artery systolic  pressure.   3. The mitral valve is normal in structure. Mild to moderate mitral valve  regurgitation. Mild mitral stenosis. The mean mitral valve gradient is 3.0  mmHg.   4. The aortic valve is calcified. There is mild calcification of the  aortic valve. There is mild thickening of the aortic valve. Aortic valve  regurgitation is not visualized. Mild to moderate aortic valve stenosis.  Aortic valve area, by VTI measures  1.44 cm. Aortic valve mean gradient measures 13.5 mmHg.   5. The inferior vena cava is normal in size with greater than 50%  respiratory variability, suggesting right atrial pressure of 3 mmHg.   FINDINGS   Left Ventricle: GLS -14.5. Left ventricular ejection fraction, by  estimation, is 60 to 65%. The left ventricle has normal function. The left  ventricle has no regional wall motion abnormalities. The left ventricular  internal cavity size was normal in  size. There is mild left ventricular hypertrophy. Left ventricular  diastolic parameters are indeterminate.   Right Ventricle: The right ventricular size is normal. No increase in  right ventricular wall thickness. Right  ventricular systolic function is  normal. There is mildly elevated pulmonary artery systolic pressure. The  tricuspid regurgitant velocity is 2.91   m/s, and with an assumed right atrial pressure of 8 mmHg, the estimated  right ventricular systolic pressure is 41.9  mmHg.   Left Atrium: Left atrial size was normal in size.   Right Atrium: Right atrial size was normal in size.   Pericardium: There is no evidence of pericardial effusion.   Mitral Valve: The mitral valve is normal in structure. There is mild  thickening of the mitral valve leaflet(s). There is moderate calcification  of the mitral valve leaflet(s). Mild mitral annular calcification. Mild to  moderate mitral valve  regurgitation. Mild mitral valve stenosis. MV peak gradient, 7.4 mmHg. The  mean mitral valve gradient is 3.0 mmHg.   Tricuspid Valve: The tricuspid valve is normal in structure. Tricuspid  valve regurgitation is mild . No evidence of tricuspid stenosis.   Aortic Valve: The aortic valve is calcified. There is mild calcification  of the aortic valve. There is mild thickening of the aortic valve. Aortic  valve regurgitation is not visualized. Mild to moderate aortic stenosis is  present. Aortic valve mean  gradient measures 13.5 mmHg. Aortic valve peak gradient measures 23.7  mmHg. Aortic valve area, by VTI measures 1.44 cm.   Pulmonic Valve: The pulmonic valve was normal in structure. Pulmonic valve  regurgitation is not visualized. No evidence of pulmonic stenosis.   Aorta: The aortic root is normal in size and structure.   Venous: The inferior vena cava is normal in size with greater than 50%  respiratory variability, suggesting right atrial pressure of 3 mmHg.   IAS/Shunts: No atrial level shunt detected by color flow Doppler.   Patient Profile     77 y.o. female  with PMH of aortic stenosis, type 2 diabetes, hypertension, hyperlipidemia, CVA,  NSVT, fibromyalgia, urolithiasis, GERD, who is  transferred from [facility] to [facility] today for further evaluation of chest pain.   Assessment & Plan    Unstable angina/CAD -Coronary angiography with moderate 2V disease and consensus opinion that medical therapy would be best given need for long segment stenting.  Will restart plavix 75mg  given ASA allergy/history of CVA.  Cont BB and atorvastatin.  Chest pain likely due to HTN.  Discharge today with cardiology follow up [facility]).  Reviewed cath findings and plan with patient and family who agree with plan.  HTN urgency --Off nitro gtt, cont irbesartan 150mg , coreg 12.5mg  BID, amlodipine 10mg    Mild to moderate aortic stenosis Mild to moderate mitral regurgitation Mild mitral stenosis  --Repeat echo pending   Type 2 DM -- Hgb A1c 5.9 -- Hold PTA metformin and Actos and ozempic --SSI while inpatient   HLD -- LDL 53, HDL 38 -- On atorvastatin   Hx of CVA -- Restart Plavix PTA, continue statin    For questions or updates, please contact [facility] Please consult [email or web address] for contact info under        Signed, Orbie Pyo, MD  05/17/2022, 10:27 AM

## 2022-05-17 NOTE — Plan of Care (Signed)

## 2022-05-18 ENCOUNTER — Ambulatory Visit: Payer: Medicare HMO | Admitting: Cardiology

## 2022-05-18 LAB — LIPOPROTEIN A (LPA): Lipoprotein (a): 21.2 nmol/L (ref <75.0)

## 2022-05-21 DIAGNOSIS — I209 Angina pectoris, unspecified: Secondary | ICD-10-CM | POA: Diagnosis not present

## 2022-05-21 LAB — BASIC METABOLIC PANEL WITH GFR
BUN/Creatinine Ratio: 24 (ref 12–28)
BUN: 28 mg/dL — ABNORMAL HIGH (ref 8–27)
CO2: 24 mmol/L (ref 20–29)
Calcium: 10.2 mg/dL (ref 8.7–10.3)
Chloride: 101 mmol/L (ref 96–106)
Creatinine, Ser: 1.17 mg/dL — ABNORMAL HIGH (ref 0.57–1.00)
Glucose: 173 mg/dL — ABNORMAL HIGH (ref 70–99)
Potassium: 4.8 mmol/L (ref 3.5–5.2)
Sodium: 140 mmol/L (ref 134–144)
eGFR: 49 mL/min/1.73 — ABNORMAL LOW

## 2022-05-23 NOTE — Progress Notes (Unsigned)
Cardiology Office Note:    Date:  05/24/2022   ID:  Gloria Sharp, DOB 08-07-1945, MRN [id number]  PCP:  Abner Greenspan, MD   [facility] Providers Cardiologist:  Gypsy Balsam, MD     Referring MD: Abner Greenspan, MD   Hospital follow up   History of Present Illness:    Gloria Sharp is a 77 y.o. female with a hx of hypertension, nonsustained VT, aortic stenosis, CVA, GERD, DM 2, hyperlipidemia, fibromyalgia.  She was most recently evaluated in the office with Dr. Bing Matter on 11/15/2021, at that time she was doing well from a cardiac perspective.  She was recovering from hernia surgery, and was doing well from that standpoint.  She presented to [facility] for chest plain, shortness of breath with exertion for 2 weeks.  Her pain was most notable with exertion, felt like tightness.  She had recently been under a high level of stress, son passed away 5 months previously.  Upon EMSs arrival her blood pressure was 200/110, she was tachycardic.  She had previously completed a stress test the day before and it was abnormal and she was advised to return to the hospital.  proBNP 2560, troponin 0.07, CXR revealed minimal right basilar atelectasis, EKG showed sinus rhythm 80 bpm LBBB.  Stress Myoview from 05/14/2022 revealed small reversible defect in the distal anterolateral wall suspicious for inducible ischemia, EF 46%.  LHC on 05/16/2022 revealed borderline significant two-vessel CAD with 60 to 70% stenosis in the mid LAD and distal left circumflex, mild aortic stenosis, recommendations to optimize medical therapy and blood pressure control.  Plavix was restarted (aspirin allergy), continue beta-blocker, changed to high intensity statin, chest pain was felt to be R/T hypertension.  She presents today accompanied by her husband for follow-up after recent hospitalization.  She states she has been doing good from a cardiac perspective, no recurrence of severely elevated blood pressure, no  chest pain, no shortness of breath.  She checks her blood pressure twice daily, she is enrolled in remote monitoring with her PCP so they call and check on her frequently. She denies palpitations, pnd, orthopnea, n, v, dizziness, syncope, edema, weight gain, or early satiety.  Her main complaint today is ongoing back pain that is limiting her ability to walk, she is following with pain management for this.   Past Medical History:  Diagnosis Date   AKI (acute kidney injury) (HCC) 03/27/2021   Angina pectoris (HCC) 05/15/2022   Aortic stenosis 09/04/2019   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 sciatica 02/12/2022   Class 3 obesity (HCC) 03/26/2021   CVA (cerebral vascular accident) (HCC) 2015?   Cystocele with uterine prolapse 01/17/2018   Diabetes (HCC)    Dizziness 03/27/2021   Dyslipidemia 10/10/2018   Essential hypertension 10/10/2018   Fibromyalgia    GERD (gastroesophageal reflux disease)    Heart murmur 10/10/2018   History of CVA (cerebrovascular accident) 10/10/2018   Hydronephrosis of right kidney 03/26/2021   Hypercholesterolemia    Hypertension    Hypokalemia 03/27/2021   Leukocytosis    Mitral regurgitation 11/24/2018   Nonsustained ventricular tachycardia (HCC) 11/24/2018   Obesity    Post-operative nausea and vomiting    "hard to wake up"   Rectocele 01/17/2018   Sacroiliitis (HCC) 02/12/2022   Sepsis secondary to UTI (HCC) 03/26/2021   Type 2 diabetes mellitus with hyperglycemia (HCC) 03/26/2021   Type 2 diabetes mellitus without complication, without long-term current use of insulin (HCC) 10/10/2018    Past  Surgical History:  Procedure Laterality Date   ABDOMINAL HYSTERECTOMY     partial, left the ovaries   APPENDECTOMY     BREAST CYST ASPIRATION Right    COLONOSCOPY  11/11/2013   Colonioc polyps status post polypectomy. Pancolonic diverticulosis predominately in the sigmoid colon   CYSTOSCOPY W/ URETERAL STENT PLACEMENT Right 03/26/2021   Procedure:  CYSTOSCOPY WITH RETROGRADE PYELOGRAM/URETERAL STENT PLACEMENT;  Surgeon: Jannifer Hick, MD;  Location: [facility];  Service: Urology;  Laterality: Right;   CYSTOSCOPY/URETEROSCOPY/HOLMIUM LASER/STENT PLACEMENT Right 04/17/2021   Procedure: CYSTOSCOPY/ RETROGRADE/URETEROSCOPY/HOLMIUM LASER/STENT PLACEMENT;  Surgeon: Jannifer Hick, MD;  Location: [facility];  Service: Urology;  Laterality: Right;  ONLY NEEDS 60 MIN   DILATION AND CURETTAGE OF UTERUS     GALLBLADDER SURGERY Right 07/2014   HERNIA REPAIR  02/20/2021   Umbilical   LEFT HEART CATH AND CORONARY ANGIOGRAPHY N/A 05/16/2022   Procedure: LEFT HEART CATH AND CORONARY ANGIOGRAPHY;  Surgeon: Iran Ouch, MD;  Location: [facility];  Service: Cardiovascular;  Laterality: N/A;   vaginal polyp removal      Current Medications: Current Meds  Medication Sig   acetaminophen (TYLENOL) 650 MG CR tablet Take 650 mg by mouth daily as needed for pain.   alendronate (FOSAMAX) 35 MG tablet Take 35 mg by mouth every Monday.   amLODipine (NORVASC) 10 MG tablet Take 1 tablet (10 mg total) by mouth daily.   atorvastatin (LIPITOR) 80 MG tablet Take 1 tablet (80 mg total) by mouth daily.   brimonidine (ALPHAGAN) 0.15 % ophthalmic solution Place 1 drop into both eyes 2 (two) times daily.   bupivacaine (MARCAINE) 0.5 % injection 0.5 mLs by Other route once. Pain lumbar medial branch block   carvedilol (COREG) 12.5 MG tablet Take 1 tablet (12.5 mg total) by mouth 2 (two) times daily with a meal.   cholestyramine (QUESTRAN) 4 g packet Take 1 packet (4 g total) by mouth daily. Take at least 2 hours before or after rest of the medications (Patient taking differently: Take 4 g by mouth every 3 (three) days.)   clopidogrel (PLAVIX) 75 MG tablet Take 1 tablet (75 mg total) by mouth daily.   Cyanocobalamin (VITAMIN B-12 PO) Take 1 tablet by mouth daily in the afternoon.   diclofenac Sodium (VOLTAREN) 1 % GEL Apply 2-4 g topically at bedtime as needed (knee  pain).   fenofibrate (TRICOR) 145 MG tablet Take 145 mg by mouth daily.   ferrous sulfate 325 (65 FE) MG EC tablet Take 325 mg by mouth daily in the afternoon.   furosemide (LASIX) 20 MG tablet Take 1 tablet (20 mg total) by mouth daily.   gabapentin (NEURONTIN) 300 MG capsule Take 300 mg by mouth 3 (three) times daily.   Infant Care Products [facility]) OINT Apply 1 application  topically 2 (two) times daily as needed (itching).   irbesartan (AVAPRO) 150 MG tablet Take 1 tablet (150 mg total) by mouth daily.   MAGNESIUM PO Take 1 tablet by mouth daily in the afternoon.   metFORMIN (GLUCOPHAGE) 1000 MG tablet Take 1,000 mg by mouth 2 (two) times daily with a meal.   Omega-3 Fatty Acids (FISH OIL PO) Take 1 capsule by mouth daily in the afternoon.   OVER THE COUNTER MEDICATION Apply 1-4 patches topically daily as needed (back pain). Coralite pain patch   pantoprazole (PROTONIX) 40 MG tablet Take 40 mg by mouth daily.   pioglitazone (ACTOS) 15 MG tablet Take 15 mg by mouth  daily.   potassium chloride SA (KLOR-CON M) 20 MEQ tablet Take 2 tablets (40 mEq total) by mouth daily.   tiZANidine (ZANAFLEX) 2 MG tablet Take 2 mg by mouth every 6 (six) hours as needed for muscle spasms.   TURMERIC PO Take 1 tablet by mouth daily in the afternoon.     Allergies:   Celebrex [celecoxib], Keflex [cephalexin], Relafen [nabumetone], Cipro [ciprofloxacin hcl], and Flagyl [metronidazole]   Social History   Socioeconomic History   Marital status: Married    Spouse name: Not on file   Number of children: Not on file   Years of education: Not on file   Highest education level: Not on file  Occupational History   Not on file  Tobacco Use   Smoking status: Never   Smokeless tobacco: Never  Vaping Use   Vaping Use: Never used  Substance and Sexual Activity   Alcohol use: Not Currently   Drug use: Never   Sexual activity: Not on file  Other Topics Concern   Not on file  Social History Narrative   Not  on file   Social Determinants of Health   Financial Resource Strain: Not on file  Food Insecurity: No Food Insecurity (05/15/2022)   Hunger Vital Sign    Worried About Running Out of Food in the Last Year: Never true    Ran Out of Food in the Last Year: Never true  Transportation Needs: No Transportation Needs (05/15/2022)   PRAPARE - Administrator, Civil Service (Medical): No    Lack of Transportation (Non-Medical): No  Physical Activity: Not on file  Stress: Not on file  Social Connections: Not on file     Family History: The patient's family history includes Cancer in her mother; Diabetes in her mother; Heart disease in her father; Heart failure in her mother; Hypertension in her father and mother; Stroke in her mother. There is no history of Colon cancer, Esophageal cancer, Stomach cancer, Rectal cancer, or Colon polyps.  ROS:   Please see the history of present illness.     All other systems reviewed and are negative.  EKGs/Labs/Other Studies Reviewed:    The following studies were reviewed today:  Echo 05/17/2022-EF 60 to 65%, mild LVH, grade 2 DD, LA mildly dilated, mild mitral stenosis, moderate mitral annular calcification, moderate calcification of the aortic valve.  LHC 05/16/2022   Dist Cx lesion is 65% stenosed.   Ost Cx to Prox Cx lesion is 30% stenosed.   Mid LAD lesion is 65% stenosed.   2nd Diag lesion is 40% stenosed.   Prox RCA-2 lesion is 20% stenosed.   Prox RCA-1 lesion is 30% stenosed.   The left ventricular systolic function is normal.   LV end diastolic pressure is mildly elevated.   The left ventricular ejection fraction is 55-65% by visual estimate.   1.  Borderline significant two-vessel coronary artery disease with 60 to 70% stenosis in the mid LAD and distal left circumflex. 2.  Normal LV systolic function mildly elevated left ventricular end-diastolic pressure. 3.  Mild aortic stenosis with peak to peak systolic gradient of 10 to 15  mmHg.   Recommendations: Recommend optimizing medical therapy and blood pressure control.  If she has refractory angina in spite of optimal medical therapy, PCI of the LAD can be considered but that will require a long stent and jailing a large second diagonal. I increased the dose of carvedilol to 12.5 mg twice daily and switch pravastatin to  high-dose atorvastatin.  echo from 12/01/2021 revealed LVEF 60 to 65%, no RWMA, mild LVH, indeterminate diastolic parameter, normal RV, mildly elevated PASP, mild to moderate mitral regurgitation, mild mitral stenosis, mean mitral valve gradient 3 mmHg, mild to moderate aortic stenosis with VTI 1.44 cm2, mean gradient 13.5 mmHg   EKG:  EKG is not ordered today.    Recent Labs: 05/15/2022: ALT 15; TSH 1.085 05/16/2022: Hemoglobin 10.4; Platelets 462 05/17/2022: Magnesium 1.7 05/21/2022: BUN 28; Creatinine, Ser 1.17; Potassium 4.8; Sodium 140  Recent Lipid Panel    Component Value Date/Time   CHOL 127 05/16/2022 0059   TRIG 179 (H) 05/16/2022 0059   HDL 38 (L) 05/16/2022 0059   CHOLHDL 3.3 05/16/2022 0059   VLDL 36 05/16/2022 0059   LDLCALC 53 05/16/2022 0059     Risk Assessment/Calculations:                Physical Exam:    VS:  BP 134/60 (BP Location: Left Arm, Patient Position: Sitting, Cuff Size: Large)   Pulse 72   Ht 5' (1.524 m)   Wt 210 lb (95.3 kg)   SpO2 98%   BMI 41.01 kg/m     Wt Readings from Last 3 Encounters:  05/24/22 210 lb (95.3 kg)  05/15/22 212 lb 4.9 oz (96.3 kg)  11/15/21 212 lb (96.2 kg)     GEN:  Well nourished, well developed in no acute distress HEENT: Normal NECK: No JVD; No carotid bruits LYMPHATICS: No lymphadenopathy CARDIAC: RRR, rubs, gallops, + murmur 3/6 systolic RESPIRATORY:  Clear to auscultation without rales, wheezing or rhonchi  ABDOMEN: Soft, non-tender, non-distended MUSCULOSKELETAL:  No edema; No deformity  SKIN: Warm and dry NEUROLOGIC:  Alert and oriented x 3 PSYCHIATRIC:  Normal  affect   ASSESSMENT:    1. Coronary artery disease involving native coronary artery of native heart, unspecified whether angina present   2. Nonrheumatic aortic valve stenosis   3. History of CVA (cerebrovascular accident)   4. Dyslipidemia   5. Essential hypertension    PLAN:    In order of problems listed above:  CAD-LHC on 06/04/2022 revealed Borderline significant two-vessel coronary artery disease with 60 to 70% stenosis in the mid LAD and distal left circumflex. Stable with no anginal symptoms. No indication for ischemic evaluation.  Continue Plavix 75 mg daily, continue Coreg 12.5 mg twice daily, continue Lipitor 80 mg daily, continue nitroglycerin as needed--has not needed. Aortic valve stenosis-moderate per most recent echo earlier this month.  She denies shortness of breath, chest pain, dizziness.  Plan to repeat in 6 months at her next visit. History of CVA-continue Plavix 75 mg daily, she has no residual deficits. Dyslipidemia-most recent LDL was well-controlled at 53 on 05/16/2022, continue Lipitor 80 mg daily, this is managed by her PCP. Hypertension-blood pressure today is 134/60, continue Norvasc 10 mg daily, continue carvedilol 12.5 mg twice daily, continue irbesartan 150 mg daily.  She has remote monitoring of her blood pressure through her PCP and this is monitored closely by them as well.   Disposition-return in 6 months.            Medication Adjustments/Labs and Tests Ordered: Current medicines are reviewed at length with the patient today.  Concerns regarding medicines are outlined above.  No orders of the defined types were placed in this encounter.  No orders of the defined types were placed in this encounter.   Patient Instructions  Medication Instructions:  Your physician recommends that you continue on your  current medications as directed. Please refer to the Current Medication list given to you today.  *If you need a refill on your cardiac medications  before your next appointment, please call your pharmacy*   Lab Work: None ordered If you have labs (blood work) drawn today and your tests are completely normal, you will receive your results only by: MyChart Message (if you have MyChart) OR A paper copy in the mail If you have any lab test that is abnormal or we need to change your treatment, we will call you to review the results.   Testing/Procedures: None ordered   Follow-Up: At [facility], you and your health needs are our priority.  As part of our continuing mission to provide you with exceptional heart care, we have created designated Provider Care Teams.  These Care Teams include your primary Cardiologist (physician) and Advanced Practice Providers (APPs -  Physician Assistants and Nurse Practitioners) who all work together to provide you with the care you need, when you need it.  We recommend signing up for the patient portal called "MyChart".  Sign up information is provided on this After Visit Summary.  MyChart is used to connect with patients for Virtual Visits (Telemedicine).  Patients are able to view lab/test results, encounter notes, upcoming appointments, etc.  Non-urgent messages can be sent to your provider as well.   To learn more about what you can do with MyChart, go to [email or web address].    Your next appointment:   6 month(s)  The format for your next appointment:   In Person  Provider:   Gypsy Balsam, MD or Wallis Bamberg, NP [facility])    Other Instructions none  Important Information About Sugar        Signed, Flossie Dibble, NP  05/24/2022 9:56 AM    [facility]

## 2022-05-24 ENCOUNTER — Encounter: Payer: Self-pay | Admitting: Cardiology

## 2022-05-24 ENCOUNTER — Ambulatory Visit: Payer: Medicare HMO | Attending: Cardiology | Admitting: Cardiology

## 2022-05-24 VITALS — BP 134/60 | HR 72 | Ht 60.0 in | Wt 210.0 lb

## 2022-05-24 DIAGNOSIS — E785 Hyperlipidemia, unspecified: Secondary | ICD-10-CM

## 2022-05-24 DIAGNOSIS — I350 Nonrheumatic aortic (valve) stenosis: Secondary | ICD-10-CM

## 2022-05-24 DIAGNOSIS — I10 Essential (primary) hypertension: Secondary | ICD-10-CM

## 2022-05-24 DIAGNOSIS — Z8673 Personal history of transient ischemic attack (TIA), and cerebral infarction without residual deficits: Secondary | ICD-10-CM | POA: Diagnosis not present

## 2022-05-24 DIAGNOSIS — I2510 Atherosclerotic heart disease of native coronary artery without angina pectoris: Secondary | ICD-10-CM

## 2022-05-24 NOTE — Patient Instructions (Signed)
Medication Instructions:  Your physician recommends that you continue on your current medications as directed. Please refer to the Current Medication list given to you today.  *If you need a refill on your cardiac medications before your next appointment, please call your pharmacy*   Lab Work: None ordered If you have labs (blood work) drawn today and your tests are completely normal, you will receive your results only by: MyChart Message (if you have MyChart) OR A paper copy in the mail If you have any lab test that is abnormal or we need to change your treatment, we will call you to review the results.   Testing/Procedures: None ordered   Follow-Up: At  HeartCare, you and your health needs are our priority.  As part of our continuing mission to provide you with exceptional heart care, we have created designated Provider Care Teams.  These Care Teams include your primary Cardiologist (physician) and Advanced Practice Providers (APPs -  Physician Assistants and Nurse Practitioners) who all work together to provide you with the care you need, when you need it.  We recommend signing up for the patient portal called "MyChart".  Sign up information is provided on this After Visit Summary.  MyChart is used to connect with patients for Virtual Visits (Telemedicine).  Patients are able to view lab/test results, encounter notes, upcoming appointments, etc.  Non-urgent messages can be sent to your provider as well.   To learn more about what you can do with MyChart, go to [email or web address].    Your next appointment:   6 month(s)  The format for your next appointment:   In Person  Provider:   Robert Krasowski, MD or Jennifer Woody, NP ([place])    Other Instructions none  Important Information About Sugar      

## 2022-05-25 DIAGNOSIS — Z7689 Persons encountering health services in other specified circumstances: Secondary | ICD-10-CM | POA: Diagnosis not present

## 2022-05-25 DIAGNOSIS — Z6841 Body Mass Index (BMI) 40.0 and over, adult: Secondary | ICD-10-CM | POA: Diagnosis not present

## 2022-05-25 DIAGNOSIS — I2510 Atherosclerotic heart disease of native coronary artery without angina pectoris: Secondary | ICD-10-CM | POA: Diagnosis not present

## 2022-05-25 DIAGNOSIS — F411 Generalized anxiety disorder: Secondary | ICD-10-CM | POA: Diagnosis not present

## 2022-06-04 DIAGNOSIS — M47816 Spondylosis without myelopathy or radiculopathy, lumbar region: Secondary | ICD-10-CM | POA: Diagnosis not present

## 2022-06-04 DIAGNOSIS — M4716 Other spondylosis with myelopathy, lumbar region: Secondary | ICD-10-CM | POA: Diagnosis not present

## 2022-06-05 DIAGNOSIS — E876 Hypokalemia: Secondary | ICD-10-CM | POA: Diagnosis not present

## 2022-06-16 DIAGNOSIS — F411 Generalized anxiety disorder: Secondary | ICD-10-CM | POA: Diagnosis not present

## 2022-06-16 DIAGNOSIS — D649 Anemia, unspecified: Secondary | ICD-10-CM | POA: Diagnosis not present

## 2022-06-20 DIAGNOSIS — Z6839 Body mass index (BMI) 39.0-39.9, adult: Secondary | ICD-10-CM | POA: Diagnosis not present

## 2022-06-20 DIAGNOSIS — H9202 Otalgia, left ear: Secondary | ICD-10-CM | POA: Diagnosis not present

## 2022-06-25 DIAGNOSIS — G8929 Other chronic pain: Secondary | ICD-10-CM | POA: Diagnosis not present

## 2022-06-25 DIAGNOSIS — M47815 Spondylosis without myelopathy or radiculopathy, thoracolumbar region: Secondary | ICD-10-CM | POA: Diagnosis not present

## 2022-07-16 DIAGNOSIS — F411 Generalized anxiety disorder: Secondary | ICD-10-CM | POA: Diagnosis not present

## 2022-07-16 DIAGNOSIS — E1159 Type 2 diabetes mellitus with other circulatory complications: Secondary | ICD-10-CM | POA: Diagnosis not present

## 2022-07-23 DIAGNOSIS — M47815 Spondylosis without myelopathy or radiculopathy, thoracolumbar region: Secondary | ICD-10-CM | POA: Diagnosis not present

## 2022-07-27 DIAGNOSIS — E1159 Type 2 diabetes mellitus with other circulatory complications: Secondary | ICD-10-CM | POA: Diagnosis not present

## 2022-07-27 DIAGNOSIS — E1129 Type 2 diabetes mellitus with other diabetic kidney complication: Secondary | ICD-10-CM | POA: Diagnosis not present

## 2022-07-27 DIAGNOSIS — E782 Mixed hyperlipidemia: Secondary | ICD-10-CM | POA: Diagnosis not present

## 2022-08-13 DIAGNOSIS — Z1389 Encounter for screening for other disorder: Secondary | ICD-10-CM | POA: Diagnosis not present

## 2022-08-13 DIAGNOSIS — Z136 Encounter for screening for cardiovascular disorders: Secondary | ICD-10-CM | POA: Diagnosis not present

## 2022-08-13 DIAGNOSIS — Z139 Encounter for screening, unspecified: Secondary | ICD-10-CM | POA: Diagnosis not present

## 2022-08-13 DIAGNOSIS — Z0000 Encounter for general adult medical examination without abnormal findings: Secondary | ICD-10-CM | POA: Diagnosis not present

## 2022-08-13 DIAGNOSIS — Z1339 Encounter for screening examination for other mental health and behavioral disorders: Secondary | ICD-10-CM | POA: Diagnosis not present

## 2022-08-13 DIAGNOSIS — Z1331 Encounter for screening for depression: Secondary | ICD-10-CM | POA: Diagnosis not present

## 2022-08-16 DIAGNOSIS — D649 Anemia, unspecified: Secondary | ICD-10-CM | POA: Diagnosis not present

## 2022-08-17 DIAGNOSIS — E782 Mixed hyperlipidemia: Secondary | ICD-10-CM | POA: Diagnosis not present

## 2022-08-17 DIAGNOSIS — I152 Hypertension secondary to endocrine disorders: Secondary | ICD-10-CM | POA: Diagnosis not present

## 2022-08-17 DIAGNOSIS — Z139 Encounter for screening, unspecified: Secondary | ICD-10-CM | POA: Diagnosis not present

## 2022-08-17 DIAGNOSIS — E1129 Type 2 diabetes mellitus with other diabetic kidney complication: Secondary | ICD-10-CM | POA: Diagnosis not present

## 2022-08-17 DIAGNOSIS — Z6841 Body Mass Index (BMI) 40.0 and over, adult: Secondary | ICD-10-CM | POA: Diagnosis not present

## 2022-08-17 DIAGNOSIS — E1159 Type 2 diabetes mellitus with other circulatory complications: Secondary | ICD-10-CM | POA: Diagnosis not present

## 2022-08-21 DIAGNOSIS — M549 Dorsalgia, unspecified: Secondary | ICD-10-CM | POA: Diagnosis not present

## 2022-08-21 DIAGNOSIS — R112 Nausea with vomiting, unspecified: Secondary | ICD-10-CM | POA: Diagnosis not present

## 2022-08-21 DIAGNOSIS — K5730 Diverticulosis of large intestine without perforation or abscess without bleeding: Secondary | ICD-10-CM | POA: Diagnosis not present

## 2022-08-21 DIAGNOSIS — I10 Essential (primary) hypertension: Secondary | ICD-10-CM | POA: Diagnosis not present

## 2022-08-21 DIAGNOSIS — M545 Low back pain, unspecified: Secondary | ICD-10-CM | POA: Diagnosis not present

## 2022-08-21 DIAGNOSIS — R109 Unspecified abdominal pain: Secondary | ICD-10-CM | POA: Diagnosis not present

## 2022-08-22 ENCOUNTER — Other Ambulatory Visit: Payer: Self-pay | Admitting: Family Medicine

## 2022-08-22 DIAGNOSIS — Z1231 Encounter for screening mammogram for malignant neoplasm of breast: Secondary | ICD-10-CM

## 2022-08-22 DIAGNOSIS — M545 Low back pain, unspecified: Secondary | ICD-10-CM | POA: Diagnosis not present

## 2022-08-22 DIAGNOSIS — R109 Unspecified abdominal pain: Secondary | ICD-10-CM | POA: Diagnosis not present

## 2022-08-28 DIAGNOSIS — M7918 Myalgia, other site: Secondary | ICD-10-CM | POA: Diagnosis not present

## 2022-08-28 DIAGNOSIS — M5416 Radiculopathy, lumbar region: Secondary | ICD-10-CM | POA: Diagnosis not present

## 2022-08-28 DIAGNOSIS — M461 Sacroiliitis, not elsewhere classified: Secondary | ICD-10-CM | POA: Diagnosis not present

## 2022-08-28 DIAGNOSIS — Z5181 Encounter for therapeutic drug level monitoring: Secondary | ICD-10-CM | POA: Diagnosis not present

## 2022-08-28 DIAGNOSIS — Z79899 Other long term (current) drug therapy: Secondary | ICD-10-CM | POA: Diagnosis not present

## 2022-08-29 ENCOUNTER — Telehealth: Payer: Self-pay

## 2022-08-29 ENCOUNTER — Inpatient Hospital Stay: Admission: RE | Admit: 2022-08-29 | Payer: Medicare HMO | Source: Ambulatory Visit

## 2022-08-29 NOTE — Telephone Encounter (Signed)
Transition Care Management Unsuccessful Follow-up Telephone Call  Date of discharge and from where:  08/22/2022 [facility]  Attempts:  1st Attempt  Reason for unsuccessful TCM follow-up call:  Voice mail full   [facility]  [facility] Population Health Community Resource Care Guide   [email or web address]  ?? [phone]   Website: [email or web address]  [email or web address]

## 2022-08-30 ENCOUNTER — Emergency Department (HOSPITAL_COMMUNITY): Payer: Medicare PPO

## 2022-08-30 ENCOUNTER — Inpatient Hospital Stay (HOSPITAL_COMMUNITY): Payer: Medicare PPO

## 2022-08-30 ENCOUNTER — Inpatient Hospital Stay (HOSPITAL_COMMUNITY)
Admission: EM | Admit: 2022-08-30 | Discharge: 2022-09-05 | DRG: 871 | Disposition: A | Discharge status: Home Health | Payer: Medicare PPO | Attending: Internal Medicine | Admitting: Internal Medicine

## 2022-08-30 ENCOUNTER — Encounter (HOSPITAL_COMMUNITY)
Admission: EM | Disposition: A | Discharge status: Home Health | Payer: Self-pay | Source: Home / Self Care | Attending: Internal Medicine

## 2022-08-30 ENCOUNTER — Other Ambulatory Visit: Payer: Self-pay

## 2022-08-30 ENCOUNTER — Other Ambulatory Visit (HOSPITAL_COMMUNITY): Payer: Medicare PPO

## 2022-08-30 ENCOUNTER — Encounter (HOSPITAL_COMMUNITY): Payer: Self-pay

## 2022-08-30 DIAGNOSIS — A4151 Sepsis due to Escherichia coli [E. coli]: Secondary | ICD-10-CM | POA: Diagnosis not present

## 2022-08-30 DIAGNOSIS — I6782 Cerebral ischemia: Secondary | ICD-10-CM | POA: Diagnosis not present

## 2022-08-30 DIAGNOSIS — R0989 Other specified symptoms and signs involving the circulatory and respiratory systems: Secondary | ICD-10-CM | POA: Diagnosis not present

## 2022-08-30 DIAGNOSIS — E876 Hypokalemia: Secondary | ICD-10-CM | POA: Diagnosis present

## 2022-08-30 DIAGNOSIS — G9341 Metabolic encephalopathy: Secondary | ICD-10-CM | POA: Diagnosis present

## 2022-08-30 DIAGNOSIS — I249 Acute ischemic heart disease, unspecified: Secondary | ICD-10-CM | POA: Diagnosis not present

## 2022-08-30 DIAGNOSIS — I2583 Coronary atherosclerosis due to lipid rich plaque: Secondary | ICD-10-CM | POA: Diagnosis not present

## 2022-08-30 DIAGNOSIS — I350 Nonrheumatic aortic (valve) stenosis: Secondary | ICD-10-CM

## 2022-08-30 DIAGNOSIS — T501X5A Adverse effect of loop [high-ceiling] diuretics, initial encounter: Secondary | ICD-10-CM | POA: Diagnosis present

## 2022-08-30 DIAGNOSIS — I214 Non-ST elevation (NSTEMI) myocardial infarction: Secondary | ICD-10-CM

## 2022-08-30 DIAGNOSIS — A419 Sepsis, unspecified organism: Secondary | ICD-10-CM | POA: Diagnosis not present

## 2022-08-30 DIAGNOSIS — I130 Hypertensive heart and chronic kidney disease with heart failure and stage 1 through stage 4 chronic kidney disease, or unspecified chronic kidney disease: Secondary | ICD-10-CM | POA: Diagnosis present

## 2022-08-30 DIAGNOSIS — I959 Hypotension, unspecified: Secondary | ICD-10-CM

## 2022-08-30 DIAGNOSIS — R55 Syncope and collapse: Secondary | ICD-10-CM | POA: Diagnosis not present

## 2022-08-30 DIAGNOSIS — I428 Other cardiomyopathies: Secondary | ICD-10-CM | POA: Diagnosis present

## 2022-08-30 DIAGNOSIS — Z452 Encounter for adjustment and management of vascular access device: Secondary | ICD-10-CM | POA: Diagnosis not present

## 2022-08-30 DIAGNOSIS — I080 Rheumatic disorders of both mitral and aortic valves: Secondary | ICD-10-CM | POA: Diagnosis present

## 2022-08-30 DIAGNOSIS — I509 Heart failure, unspecified: Secondary | ICD-10-CM | POA: Diagnosis not present

## 2022-08-30 DIAGNOSIS — R579 Shock, unspecified: Secondary | ICD-10-CM

## 2022-08-30 DIAGNOSIS — R570 Cardiogenic shock: Secondary | ICD-10-CM | POA: Diagnosis not present

## 2022-08-30 DIAGNOSIS — E1165 Type 2 diabetes mellitus with hyperglycemia: Secondary | ICD-10-CM | POA: Diagnosis present

## 2022-08-30 DIAGNOSIS — R569 Unspecified convulsions: Secondary | ICD-10-CM | POA: Diagnosis not present

## 2022-08-30 DIAGNOSIS — K589 Irritable bowel syndrome without diarrhea: Secondary | ICD-10-CM | POA: Diagnosis present

## 2022-08-30 DIAGNOSIS — Z833 Family history of diabetes mellitus: Secondary | ICD-10-CM

## 2022-08-30 DIAGNOSIS — I2510 Atherosclerotic heart disease of native coronary artery without angina pectoris: Secondary | ICD-10-CM | POA: Diagnosis present

## 2022-08-30 DIAGNOSIS — E1122 Type 2 diabetes mellitus with diabetic chronic kidney disease: Secondary | ICD-10-CM | POA: Diagnosis present

## 2022-08-30 DIAGNOSIS — J9601 Acute respiratory failure with hypoxia: Secondary | ICD-10-CM | POA: Diagnosis not present

## 2022-08-30 DIAGNOSIS — G8929 Other chronic pain: Secondary | ICD-10-CM | POA: Diagnosis present

## 2022-08-30 DIAGNOSIS — J45909 Unspecified asthma, uncomplicated: Secondary | ICD-10-CM | POA: Diagnosis present

## 2022-08-30 DIAGNOSIS — M797 Fibromyalgia: Secondary | ICD-10-CM | POA: Diagnosis present

## 2022-08-30 DIAGNOSIS — N390 Urinary tract infection, site not specified: Secondary | ICD-10-CM | POA: Diagnosis present

## 2022-08-30 DIAGNOSIS — Z9911 Dependence on respirator [ventilator] status: Secondary | ICD-10-CM | POA: Diagnosis not present

## 2022-08-30 DIAGNOSIS — J9602 Acute respiratory failure with hypercapnia: Secondary | ICD-10-CM | POA: Diagnosis not present

## 2022-08-30 DIAGNOSIS — Z883 Allergy status to other anti-infective agents status: Secondary | ICD-10-CM

## 2022-08-30 DIAGNOSIS — Z886 Allergy status to analgesic agent status: Secondary | ICD-10-CM

## 2022-08-30 DIAGNOSIS — Z6841 Body Mass Index (BMI) 40.0 and over, adult: Secondary | ICD-10-CM | POA: Diagnosis not present

## 2022-08-30 DIAGNOSIS — Z1152 Encounter for screening for COVID-19: Secondary | ICD-10-CM | POA: Diagnosis not present

## 2022-08-30 DIAGNOSIS — I050 Rheumatic mitral stenosis: Secondary | ICD-10-CM

## 2022-08-30 DIAGNOSIS — R7401 Elevation of levels of liver transaminase levels: Secondary | ICD-10-CM | POA: Diagnosis present

## 2022-08-30 DIAGNOSIS — Z7984 Long term (current) use of oral hypoglycemic drugs: Secondary | ICD-10-CM

## 2022-08-30 DIAGNOSIS — I160 Hypertensive urgency: Secondary | ICD-10-CM | POA: Diagnosis present

## 2022-08-30 DIAGNOSIS — N179 Acute kidney failure, unspecified: Secondary | ICD-10-CM | POA: Diagnosis not present

## 2022-08-30 DIAGNOSIS — Z881 Allergy status to other antibiotic agents status: Secondary | ICD-10-CM

## 2022-08-30 DIAGNOSIS — R0602 Shortness of breath: Secondary | ICD-10-CM | POA: Diagnosis present

## 2022-08-30 DIAGNOSIS — M4800 Spinal stenosis, site unspecified: Secondary | ICD-10-CM | POA: Diagnosis present

## 2022-08-30 DIAGNOSIS — E119 Type 2 diabetes mellitus without complications: Secondary | ICD-10-CM | POA: Diagnosis not present

## 2022-08-30 DIAGNOSIS — I10 Essential (primary) hypertension: Secondary | ICD-10-CM | POA: Diagnosis not present

## 2022-08-30 DIAGNOSIS — E872 Acidosis, unspecified: Secondary | ICD-10-CM | POA: Diagnosis not present

## 2022-08-30 DIAGNOSIS — R404 Transient alteration of awareness: Secondary | ICD-10-CM | POA: Diagnosis not present

## 2022-08-30 DIAGNOSIS — K219 Gastro-esophageal reflux disease without esophagitis: Secondary | ICD-10-CM | POA: Diagnosis present

## 2022-08-30 DIAGNOSIS — J810 Acute pulmonary edema: Secondary | ICD-10-CM | POA: Diagnosis not present

## 2022-08-30 DIAGNOSIS — Z8711 Personal history of peptic ulcer disease: Secondary | ICD-10-CM

## 2022-08-30 DIAGNOSIS — I252 Old myocardial infarction: Secondary | ICD-10-CM

## 2022-08-30 DIAGNOSIS — R092 Respiratory arrest: Secondary | ICD-10-CM | POA: Diagnosis not present

## 2022-08-30 DIAGNOSIS — Z90710 Acquired absence of both cervix and uterus: Secondary | ICD-10-CM

## 2022-08-30 DIAGNOSIS — S0990XA Unspecified injury of head, initial encounter: Secondary | ICD-10-CM | POA: Diagnosis not present

## 2022-08-30 DIAGNOSIS — I340 Nonrheumatic mitral (valve) insufficiency: Secondary | ICD-10-CM | POA: Diagnosis not present

## 2022-08-30 DIAGNOSIS — M549 Dorsalgia, unspecified: Secondary | ICD-10-CM | POA: Diagnosis not present

## 2022-08-30 DIAGNOSIS — E7800 Pure hypercholesterolemia, unspecified: Secondary | ICD-10-CM | POA: Diagnosis not present

## 2022-08-30 DIAGNOSIS — I213 ST elevation (STEMI) myocardial infarction of unspecified site: Secondary | ICD-10-CM | POA: Diagnosis not present

## 2022-08-30 DIAGNOSIS — J90 Pleural effusion, not elsewhere classified: Secondary | ICD-10-CM | POA: Diagnosis not present

## 2022-08-30 DIAGNOSIS — R578 Other shock: Secondary | ICD-10-CM

## 2022-08-30 DIAGNOSIS — R918 Other nonspecific abnormal finding of lung field: Secondary | ICD-10-CM | POA: Diagnosis not present

## 2022-08-30 DIAGNOSIS — I447 Left bundle-branch block, unspecified: Secondary | ICD-10-CM | POA: Diagnosis present

## 2022-08-30 DIAGNOSIS — Z8249 Family history of ischemic heart disease and other diseases of the circulatory system: Secondary | ICD-10-CM

## 2022-08-30 DIAGNOSIS — D509 Iron deficiency anemia, unspecified: Secondary | ICD-10-CM | POA: Diagnosis present

## 2022-08-30 DIAGNOSIS — J984 Other disorders of lung: Secondary | ICD-10-CM | POA: Diagnosis not present

## 2022-08-30 DIAGNOSIS — R6521 Severe sepsis with septic shock: Secondary | ICD-10-CM | POA: Diagnosis present

## 2022-08-30 DIAGNOSIS — K760 Fatty (change of) liver, not elsewhere classified: Secondary | ICD-10-CM | POA: Diagnosis not present

## 2022-08-30 DIAGNOSIS — K580 Irritable bowel syndrome with diarrhea: Secondary | ICD-10-CM | POA: Diagnosis present

## 2022-08-30 DIAGNOSIS — I5021 Acute systolic (congestive) heart failure: Secondary | ICD-10-CM | POA: Diagnosis not present

## 2022-08-30 DIAGNOSIS — Z7983 Long term (current) use of bisphosphonates: Secondary | ICD-10-CM

## 2022-08-30 DIAGNOSIS — K5730 Diverticulosis of large intestine without perforation or abscess without bleeding: Secondary | ICD-10-CM | POA: Diagnosis not present

## 2022-08-30 DIAGNOSIS — I951 Orthostatic hypotension: Secondary | ICD-10-CM | POA: Diagnosis not present

## 2022-08-30 DIAGNOSIS — R7989 Other specified abnormal findings of blood chemistry: Secondary | ICD-10-CM | POA: Diagnosis not present

## 2022-08-30 DIAGNOSIS — Z7902 Long term (current) use of antithrombotics/antiplatelets: Secondary | ICD-10-CM

## 2022-08-30 DIAGNOSIS — Z79899 Other long term (current) drug therapy: Secondary | ICD-10-CM

## 2022-08-30 DIAGNOSIS — Z4682 Encounter for fitting and adjustment of non-vascular catheter: Secondary | ICD-10-CM | POA: Diagnosis not present

## 2022-08-30 DIAGNOSIS — Z8673 Personal history of transient ischemic attack (TIA), and cerebral infarction without residual deficits: Secondary | ICD-10-CM

## 2022-08-30 DIAGNOSIS — M545 Low back pain, unspecified: Secondary | ICD-10-CM | POA: Diagnosis present

## 2022-08-30 DIAGNOSIS — I469 Cardiac arrest, cause unspecified: Secondary | ICD-10-CM | POA: Diagnosis not present

## 2022-08-30 DIAGNOSIS — I499 Cardiac arrhythmia, unspecified: Secondary | ICD-10-CM | POA: Diagnosis not present

## 2022-08-30 DIAGNOSIS — N1831 Chronic kidney disease, stage 3a: Secondary | ICD-10-CM | POA: Diagnosis present

## 2022-08-30 DIAGNOSIS — I700 Atherosclerosis of aorta: Secondary | ICD-10-CM | POA: Diagnosis not present

## 2022-08-30 DIAGNOSIS — R9431 Abnormal electrocardiogram [ECG] [EKG]: Secondary | ICD-10-CM | POA: Diagnosis not present

## 2022-08-30 DIAGNOSIS — R4020 Unspecified coma: Secondary | ICD-10-CM | POA: Diagnosis not present

## 2022-08-30 HISTORY — PX: RIGHT/LEFT HEART CATH AND CORONARY ANGIOGRAPHY: CATH118266

## 2022-08-30 LAB — POCT I-STAT 7, (LYTES, BLD GAS, ICA,H+H)
Acid-base deficit: 7 mmol/L — ABNORMAL HIGH (ref 0.0–2.0)
Acid-base deficit: 6 mmol/L — ABNORMAL HIGH (ref 0.0–2.0)
Bicarbonate: 20.1 mmol/L (ref 20.0–28.0)
Bicarbonate: 19 mmol/L — ABNORMAL LOW (ref 20.0–28.0)
Calcium, Ion: 1.22 mmol/L (ref 1.15–1.40)
Calcium, Ion: 1.2 mmol/L (ref 1.15–1.40)
HCT: 32 % — ABNORMAL LOW (ref 36.0–46.0)
HCT: 30 % — ABNORMAL LOW (ref 36.0–46.0)
Hemoglobin: 10.9 g/dL — ABNORMAL LOW (ref 12.0–15.0)
Hemoglobin: 10.2 g/dL — ABNORMAL LOW (ref 12.0–15.0)
O2 Saturation: 100 %
O2 Saturation: 95 %
Patient temperature: 36.8
Potassium: 4.3 mmol/L (ref 3.5–5.1)
Potassium: 3.7 mmol/L (ref 3.5–5.1)
Sodium: 137 mmol/L (ref 135–145)
Sodium: 141 mmol/L (ref 135–145)
TCO2: 21 mmol/L — ABNORMAL LOW (ref 22–32)
TCO2: 20 mmol/L — ABNORMAL LOW (ref 22–32)
pCO2 arterial: 44.2 mmHg (ref 32–48)
pCO2 arterial: 35.9 mmHg (ref 32–48)
pH, Arterial: 7.265 — ABNORMAL LOW (ref 7.35–7.45)
pH, Arterial: 7.332 — ABNORMAL LOW (ref 7.35–7.45)
pO2, Arterial: 238 mmHg — ABNORMAL HIGH (ref 83–108)
pO2, Arterial: 79 mmHg — ABNORMAL LOW (ref 83–108)

## 2022-08-30 LAB — I-STAT CG4 LACTIC ACID, ED: Lactic Acid, Venous: 4 mmol/L (ref 0.5–1.9)

## 2022-08-30 LAB — POCT I-STAT EG7
Acid-base deficit: 1 mmol/L (ref 0.0–2.0)
Acid-base deficit: 6 mmol/L — ABNORMAL HIGH (ref 0.0–2.0)
Bicarbonate: 20.2 mmol/L (ref 20.0–28.0)
Bicarbonate: 24.3 mmol/L (ref 20.0–28.0)
Calcium, Ion: 1.12 mmol/L — ABNORMAL LOW (ref 1.15–1.40)
Calcium, Ion: 1.17 mmol/L (ref 1.15–1.40)
HCT: 29 % — ABNORMAL LOW (ref 36.0–46.0)
HCT: 30 % — ABNORMAL LOW (ref 36.0–46.0)
Hemoglobin: 10.2 g/dL — ABNORMAL LOW (ref 12.0–15.0)
Hemoglobin: 9.9 g/dL — ABNORMAL LOW (ref 12.0–15.0)
O2 Saturation: 59 %
O2 Saturation: 73 %
Potassium: 3.4 mmol/L — ABNORMAL LOW (ref 3.5–5.1)
Potassium: 3.7 mmol/L (ref 3.5–5.1)
Sodium: 142 mmol/L (ref 135–145)
Sodium: 144 mmol/L (ref 135–145)
TCO2: 21 mmol/L — ABNORMAL LOW (ref 22–32)
TCO2: 26 mmol/L (ref 22–32)
pCO2, Ven: 40.6 mmHg — ABNORMAL LOW (ref 44–60)
pCO2, Ven: 42.8 mmHg — ABNORMAL LOW (ref 44–60)
pH, Ven: 7.304 (ref 7.25–7.43)
pH, Ven: 7.361 (ref 7.25–7.43)
pO2, Ven: 32 mmHg (ref 32–45)
pO2, Ven: 42 mmHg (ref 32–45)

## 2022-08-30 LAB — URINALYSIS, ROUTINE W REFLEX MICROSCOPIC
Bilirubin Urine: NEGATIVE
Glucose, UA: 50 mg/dL — AB
Hgb urine dipstick: NEGATIVE
Ketones, ur: NEGATIVE mg/dL
Nitrite: NEGATIVE
Protein, ur: >=300 mg/dL — AB
Specific Gravity, Urine: 1.019 (ref 1.005–1.030)
WBC, UA: >50 WBC/hpf (ref 0–5)
pH: 5 (ref 5.0–8.0)

## 2022-08-30 LAB — I-STAT ARTERIAL BLOOD GAS, ED
Acid-base deficit: 12 mmol/L — ABNORMAL HIGH (ref 0.0–2.0)
Acid-base deficit: 9 mmol/L — ABNORMAL HIGH (ref 0.0–2.0)
Bicarbonate: 18 mmol/L — ABNORMAL LOW (ref 20.0–28.0)
Bicarbonate: 20.5 mmol/L (ref 20.0–28.0)
Calcium, Ion: 1.25 mmol/L (ref 1.15–1.40)
Calcium, Ion: 1.25 mmol/L (ref 1.15–1.40)
HCT: 33 % — ABNORMAL LOW (ref 36.0–46.0)
HCT: 31 % — ABNORMAL LOW (ref 36.0–46.0)
Hemoglobin: 11.2 g/dL — ABNORMAL LOW (ref 12.0–15.0)
Hemoglobin: 10.5 g/dL — ABNORMAL LOW (ref 12.0–15.0)
O2 Saturation: 95 %
O2 Saturation: 92 %
Potassium: 4.9 mmol/L (ref 3.5–5.1)
Potassium: 4.3 mmol/L (ref 3.5–5.1)
Sodium: 138 mmol/L (ref 135–145)
Sodium: 136 mmol/L (ref 135–145)
TCO2: 20 mmol/L — ABNORMAL LOW (ref 22–32)
TCO2: 22 mmol/L (ref 22–32)
pCO2 arterial: 57.6 mmHg — ABNORMAL HIGH (ref 32–48)
pCO2 arterial: 61.8 mmHg — ABNORMAL HIGH (ref 32–48)
pH, Arterial: 7.104 — CL (ref 7.35–7.45)
pH, Arterial: 7.129 — CL (ref 7.35–7.45)
pO2, Arterial: 103 mmHg (ref 83–108)
pO2, Arterial: 85 mmHg (ref 83–108)

## 2022-08-30 LAB — GLUCOSE, CAPILLARY
Glucose-Capillary: 118 mg/dL — ABNORMAL HIGH (ref 70–99)
Glucose-Capillary: 132 mg/dL — ABNORMAL HIGH (ref 70–99)
Glucose-Capillary: 150 mg/dL — ABNORMAL HIGH (ref 70–99)
Glucose-Capillary: 153 mg/dL — ABNORMAL HIGH (ref 70–99)
Glucose-Capillary: 164 mg/dL — ABNORMAL HIGH (ref 70–99)
Glucose-Capillary: 186 mg/dL — ABNORMAL HIGH (ref 70–99)

## 2022-08-30 LAB — BASIC METABOLIC PANEL
Anion gap: 13 (ref 5–15)
BUN: 25 mg/dL — ABNORMAL HIGH (ref 8–23)
CO2: 22 mmol/L (ref 22–32)
Calcium: 8.2 mg/dL — ABNORMAL LOW (ref 8.9–10.3)
Chloride: 105 mmol/L (ref 98–111)
Creatinine, Ser: 1.11 mg/dL — ABNORMAL HIGH (ref 0.44–1.00)
GFR, Estimated: 52 mL/min — ABNORMAL LOW (ref >60)
Glucose, Bld: 139 mg/dL — ABNORMAL HIGH (ref 70–99)
Potassium: 2.7 mmol/L — CL (ref 3.5–5.1)
Sodium: 140 mmol/L (ref 135–145)

## 2022-08-30 LAB — I-STAT CHEM 8, ED
BUN: 27 mg/dL — ABNORMAL HIGH (ref 8–23)
Calcium, Ion: 1.24 mmol/L (ref 1.15–1.40)
Chloride: 105 mmol/L (ref 98–111)
Creatinine, Ser: 1.2 mg/dL — ABNORMAL HIGH (ref 0.44–1.00)
Glucose, Bld: 300 mg/dL — ABNORMAL HIGH (ref 70–99)
HCT: 37 % (ref 36.0–46.0)
Hemoglobin: 12.6 g/dL (ref 12.0–15.0)
Potassium: 4.7 mmol/L (ref 3.5–5.1)
Sodium: 139 mmol/L (ref 135–145)
TCO2: 20 mmol/L — ABNORMAL LOW (ref 22–32)

## 2022-08-30 LAB — CG4 I-STAT (LACTIC ACID): Lactic Acid, Venous: 1.1 mmol/L (ref 0.5–1.9)

## 2022-08-30 LAB — CBC WITH DIFFERENTIAL/PLATELET
Abs Immature Granulocytes: 0.11 10*3/uL — ABNORMAL HIGH (ref 0.00–0.07)
Basophils Absolute: 0.1 10*3/uL (ref 0.0–0.1)
Basophils Relative: 0 %
Eosinophils Absolute: 0.2 10*3/uL (ref 0.0–0.5)
Eosinophils Relative: 1 %
HCT: 36.5 % (ref 36.0–46.0)
Hemoglobin: 10.9 g/dL — ABNORMAL LOW (ref 12.0–15.0)
Immature Granulocytes: 1 %
Lymphocytes Relative: 19 %
Lymphs Abs: 3 10*3/uL (ref 0.7–4.0)
MCH: 26.8 pg (ref 26.0–34.0)
MCHC: 29.9 g/dL — ABNORMAL LOW (ref 30.0–36.0)
MCV: 89.7 fL (ref 80.0–100.0)
Monocytes Absolute: 1.2 10*3/uL — ABNORMAL HIGH (ref 0.1–1.0)
Monocytes Relative: 7 %
Neutro Abs: 11.7 10*3/uL — ABNORMAL HIGH (ref 1.7–7.7)
Neutrophils Relative %: 72 %
Platelets: 517 10*3/uL — ABNORMAL HIGH (ref 150–400)
RBC: 4.07 MIL/uL (ref 3.87–5.11)
RDW: 14.7 % (ref 11.5–15.5)
WBC: 16.2 10*3/uL — ABNORMAL HIGH (ref 4.0–10.5)
nRBC: 0 % (ref 0.0–0.2)

## 2022-08-30 LAB — COMPREHENSIVE METABOLIC PANEL
ALT: 28 U/L (ref 0–44)
AST: 42 U/L — ABNORMAL HIGH (ref 15–41)
Albumin: 3.3 g/dL — ABNORMAL LOW (ref 3.5–5.0)
Alkaline Phosphatase: 75 U/L (ref 38–126)
Anion gap: 14 (ref 5–15)
BUN: 25 mg/dL — ABNORMAL HIGH (ref 8–23)
CO2: 15 mmol/L — ABNORMAL LOW (ref 22–32)
Calcium: 8.6 mg/dL — ABNORMAL LOW (ref 8.9–10.3)
Chloride: 106 mmol/L (ref 98–111)
Creatinine, Ser: 1.07 mg/dL — ABNORMAL HIGH (ref 0.44–1.00)
GFR, Estimated: 54 mL/min — ABNORMAL LOW (ref >60)
Glucose, Bld: 295 mg/dL — ABNORMAL HIGH (ref 70–99)
Potassium: 4.7 mmol/L (ref 3.5–5.1)
Sodium: 135 mmol/L (ref 135–145)
Total Bilirubin: 0.7 mg/dL (ref 0.3–1.2)
Total Protein: 6.3 g/dL — ABNORMAL LOW (ref 6.5–8.1)

## 2022-08-30 LAB — PROTIME-INR
INR: 1.3 — ABNORMAL HIGH (ref 0.8–1.2)
Prothrombin Time: 16.7 seconds — ABNORMAL HIGH (ref 11.4–15.2)

## 2022-08-30 LAB — COOXEMETRY PANEL
Carboxyhemoglobin: 1.6 % — ABNORMAL HIGH (ref 0.5–1.5)
Methemoglobin: <0.7 % (ref 0.0–1.5)
O2 Saturation: 72.7 %
Total hemoglobin: 10.3 g/dL — ABNORMAL LOW (ref 12.0–16.0)

## 2022-08-30 LAB — MRSA NEXT GEN BY PCR, NASAL: MRSA by PCR Next Gen: NOT DETECTED

## 2022-08-30 LAB — RESP PANEL BY RT-PCR (RSV, FLU A&B, COVID)  RVPGX2
Influenza A by PCR: NEGATIVE
Influenza B by PCR: NEGATIVE
Resp Syncytial Virus by PCR: NEGATIVE
SARS Coronavirus 2 by RT PCR: NEGATIVE

## 2022-08-30 LAB — TROPONIN I (HIGH SENSITIVITY)
Troponin I (High Sensitivity): 584 ng/L (ref <18)
Troponin I (High Sensitivity): 1948 ng/L (ref <18)

## 2022-08-30 LAB — MAGNESIUM: Magnesium: 1.2 mg/dL — ABNORMAL LOW (ref 1.7–2.4)

## 2022-08-30 LAB — BRAIN NATRIURETIC PEPTIDE: B Natriuretic Peptide: 458.3 pg/mL — ABNORMAL HIGH (ref 0.0–100.0)

## 2022-08-30 LAB — APTT: aPTT: 31 seconds (ref 24–36)

## 2022-08-30 LAB — TRIGLYCERIDES: Triglycerides: 144 mg/dL (ref <150)

## 2022-08-30 LAB — CBG MONITORING, ED: Glucose-Capillary: 304 mg/dL — ABNORMAL HIGH (ref 70–99)

## 2022-08-30 SURGERY — RIGHT/LEFT HEART CATH AND CORONARY ANGIOGRAPHY
Anesthesia: LOCAL

## 2022-08-30 MED ORDER — POLYETHYLENE GLYCOL 3350 17 G PO PACK: 17.0000 g | PACK | Freq: Every day | ORAL | Status: DC

## 2022-08-30 MED ORDER — LABETALOL HCL 5 MG/ML IV SOLN: 10.0000 mg | INTRAVENOUS | Status: AC | PRN

## 2022-08-30 MED ORDER — VERAPAMIL HCL 2.5 MG/ML IV SOLN
INTRAVENOUS | Status: DC | PRN
  Administered 2022-08-30: 10 mL via INTRA_ARTERIAL

## 2022-08-30 MED ORDER — ETOMIDATE 2 MG/ML IV SOLN
INTRAVENOUS | Status: AC | PRN
  Administered 2022-08-30: 20 mg via INTRAVENOUS

## 2022-08-30 MED ORDER — HEPARIN (PORCINE) 25000 UT/250ML-% IV SOLN
1050.0000 [IU]/h | INTRAVENOUS | Status: DC
  Administered 2022-08-30: 850 [IU]/h via INTRAVENOUS
  Filled 2022-08-30: qty 250

## 2022-08-30 MED ORDER — VERAPAMIL HCL 2.5 MG/ML IV SOLN
INTRAVENOUS | Status: AC
  Filled 2022-08-30: qty 2

## 2022-08-30 MED ORDER — ATORVASTATIN CALCIUM 80 MG PO TABS
80.0000 mg | ORAL_TABLET | Freq: Every day | ORAL | Status: DC
  Administered 2022-08-30 – 2022-08-31 (×2): 80 mg
  Filled 2022-08-30 (×2): qty 1

## 2022-08-30 MED ORDER — ACETAMINOPHEN 325 MG PO TABS
650.0000 mg | ORAL_TABLET | ORAL | Status: DC | PRN
  Administered 2022-08-30: 650 mg
  Filled 2022-08-30: qty 2

## 2022-08-30 MED ORDER — VANCOMYCIN HCL 1500 MG/300ML IV SOLN
1500.0000 mg | Freq: Once | INTRAVENOUS | Status: AC
  Administered 2022-08-30: 1500 mg via INTRAVENOUS
  Filled 2022-08-30: qty 300

## 2022-08-30 MED ORDER — ASPIRIN 81 MG PO CHEW: 81.0000 mg | CHEWABLE_TABLET | Freq: Every day | ORAL | Status: DC

## 2022-08-30 MED ORDER — ONDANSETRON HCL 4 MG/2ML IJ SOLN: 4.0000 mg | Freq: Four times a day (QID) | INTRAMUSCULAR | Status: DC | PRN

## 2022-08-30 MED ORDER — FENTANYL CITRATE PF 50 MCG/ML IJ SOSY: 25.0000 ug | PREFILLED_SYRINGE | INTRAMUSCULAR | Status: DC | PRN

## 2022-08-30 MED ORDER — SODIUM CHLORIDE 0.9% FLUSH
3.0000 mL | Freq: Two times a day (BID) | INTRAVENOUS | Status: DC
  Administered 2022-08-30 – 2022-09-05 (×13): 3 mL via INTRAVENOUS

## 2022-08-30 MED ORDER — IPRATROPIUM-ALBUTEROL 0.5-2.5 (3) MG/3ML IN SOLN: 3.0000 mL | Freq: Four times a day (QID) | RESPIRATORY_TRACT | Status: DC | PRN

## 2022-08-30 MED ORDER — POTASSIUM CHLORIDE 10 MEQ/50ML IV SOLN
10.0000 meq | INTRAVENOUS | Status: AC
  Administered 2022-08-30 – 2022-08-31 (×4): 10 meq via INTRAVENOUS
  Filled 2022-08-30: qty 50

## 2022-08-30 MED ORDER — HEPARIN SODIUM (PORCINE) 1000 UNIT/ML IJ SOLN
INTRAMUSCULAR | Status: AC
  Filled 2022-08-30: qty 10

## 2022-08-30 MED ORDER — PHENYLEPHRINE HCL-NACL 20-0.9 MG/250ML-% IV SOLN
INTRAVENOUS | Status: AC | PRN
  Administered 2022-08-30: 40 ug/min via INTRAVENOUS

## 2022-08-30 MED ORDER — FAMOTIDINE 20 MG PO TABS
20.0000 mg | ORAL_TABLET | Freq: Two times a day (BID) | ORAL | Status: DC
  Administered 2022-08-30 (×2): 20 mg
  Filled 2022-08-30 (×2): qty 1

## 2022-08-30 MED ORDER — FENTANYL CITRATE PF 50 MCG/ML IJ SOSY
25.0000 ug | PREFILLED_SYRINGE | Freq: Once | INTRAMUSCULAR | Status: AC
  Administered 2022-08-30: 25 ug via INTRAVENOUS
  Filled 2022-08-30: qty 1

## 2022-08-30 MED ORDER — HEPARIN (PORCINE) 25000 UT/250ML-% IV SOLN
850.0000 [IU]/h | INTRAVENOUS | Status: DC
  Administered 2022-08-30: 850 [IU]/h via INTRAVENOUS
  Filled 2022-08-30: qty 250

## 2022-08-30 MED ORDER — IPRATROPIUM-ALBUTEROL 0.5-2.5 (3) MG/3ML IN SOLN
3.0000 mL | Freq: Four times a day (QID) | RESPIRATORY_TRACT | Status: DC
  Administered 2022-08-30 – 2022-08-31 (×5): 3 mL via RESPIRATORY_TRACT
  Filled 2022-08-30 (×5): qty 3

## 2022-08-30 MED ORDER — AMIODARONE HCL IN DEXTROSE 360-4.14 MG/200ML-% IV SOLN: 30.0000 mg/h | INTRAVENOUS | Status: DC

## 2022-08-30 MED ORDER — HEPARIN (PORCINE) 25000 UT/250ML-% IV SOLN: 1150.0000 [IU]/h | INTRAVENOUS | Status: DC

## 2022-08-30 MED ORDER — AMIODARONE HCL IN DEXTROSE 360-4.14 MG/200ML-% IV SOLN: 60.0000 mg/h | INTRAVENOUS | Status: DC

## 2022-08-30 MED ORDER — SODIUM BICARBONATE 8.4 % IV SOLN
INTRAVENOUS | Status: DC | PRN
  Administered 2022-08-30: 25 meq via INTRAVENOUS

## 2022-08-30 MED ORDER — SODIUM CHLORIDE 0.9 % IV SOLN
2.0000 g | Freq: Once | INTRAVENOUS | Status: AC
  Administered 2022-08-30: 2 g via INTRAVENOUS
  Filled 2022-08-30: qty 10

## 2022-08-30 MED ORDER — ROCURONIUM BROMIDE 50 MG/5ML IV SOLN
INTRAVENOUS | Status: AC | PRN
  Administered 2022-08-30: 100 mg via INTRAVENOUS

## 2022-08-30 MED ORDER — LIDOCAINE HCL (PF) 1 % IJ SOLN
INTRAMUSCULAR | Status: DC | PRN
  Administered 2022-08-30 (×2): 2 mL

## 2022-08-30 MED ORDER — IOHEXOL 350 MG/ML SOLN
75.0000 mL | Freq: Once | INTRAVENOUS | Status: AC | PRN
  Administered 2022-08-30: 75 mL via INTRAVENOUS

## 2022-08-30 MED ORDER — AMIODARONE LOAD VIA INFUSION
150.0000 mg | Freq: Once | INTRAVENOUS | Status: DC
  Filled 2022-08-30: qty 83.34

## 2022-08-30 MED ORDER — SODIUM CHLORIDE 0.9% FLUSH: 3.0000 mL | INTRAVENOUS | Status: DC | PRN

## 2022-08-30 MED ORDER — FUROSEMIDE 10 MG/ML IJ SOLN
INTRAMUSCULAR | Status: AC
  Filled 2022-08-30: qty 4

## 2022-08-30 MED ORDER — HYDRALAZINE HCL 20 MG/ML IJ SOLN: 10.0000 mg | INTRAMUSCULAR | Status: AC | PRN

## 2022-08-30 MED ORDER — SODIUM CHLORIDE 0.9 % IV SOLN
2.0000 g | Freq: Two times a day (BID) | INTRAVENOUS | Status: AC
  Administered 2022-08-30 – 2022-09-04 (×10): 2 g via INTRAVENOUS
  Filled 2022-08-30 (×10): qty 12.5

## 2022-08-30 MED ORDER — MAGNESIUM SULFATE 2 GM/50ML IV SOLN
2.0000 g | Freq: Once | INTRAVENOUS | Status: AC
  Administered 2022-08-30: 2 g via INTRAVENOUS
  Filled 2022-08-30: qty 50

## 2022-08-30 MED ORDER — SODIUM CHLORIDE 0.9 % IV SOLN
2.0000 g | Freq: Three times a day (TID) | INTRAVENOUS | Status: DC
  Filled 2022-08-30: qty 10

## 2022-08-30 MED ORDER — PANTOPRAZOLE SODIUM 40 MG IV SOLR
40.0000 mg | INTRAVENOUS | Status: DC
  Administered 2022-08-30 – 2022-08-31 (×2): 40 mg via INTRAVENOUS
  Filled 2022-08-30 (×2): qty 10

## 2022-08-30 MED ORDER — LIDOCAINE HCL (PF) 1 % IJ SOLN
INTRAMUSCULAR | Status: AC
  Filled 2022-08-30: qty 30

## 2022-08-30 MED ORDER — LACTATED RINGERS IV BOLUS (SEPSIS)
1000.0000 mL | Freq: Once | INTRAVENOUS | Status: AC
  Administered 2022-08-30: 1000 mL via INTRAVENOUS

## 2022-08-30 MED ORDER — ONDANSETRON HCL 4 MG/2ML IJ SOLN
4.0000 mg | Freq: Four times a day (QID) | INTRAMUSCULAR | Status: DC | PRN
  Administered 2022-09-02: 4 mg via INTRAVENOUS
  Filled 2022-08-30: qty 2

## 2022-08-30 MED ORDER — IOHEXOL 350 MG/ML SOLN
INTRAVENOUS | Status: DC | PRN
  Administered 2022-08-30: 65 mL

## 2022-08-30 MED ORDER — MAGNESIUM SULFATE 4 GM/100ML IV SOLN
4.0000 g | Freq: Once | INTRAVENOUS | Status: AC
  Administered 2022-08-30: 4 g via INTRAVENOUS
  Filled 2022-08-30: qty 100

## 2022-08-30 MED ORDER — ASPIRIN 81 MG PO CHEW: 324.0000 mg | CHEWABLE_TABLET | Freq: Once | ORAL | Status: DC

## 2022-08-30 MED ORDER — SODIUM CHLORIDE 0.9 % IV SOLN: INTRAVENOUS | Status: DC

## 2022-08-30 MED ORDER — FUROSEMIDE 10 MG/ML IJ SOLN
40.0000 mg | Freq: Two times a day (BID) | INTRAMUSCULAR | Status: DC
  Administered 2022-08-30 – 2022-08-31 (×3): 40 mg via INTRAVENOUS
  Filled 2022-08-30 (×3): qty 4

## 2022-08-30 MED ORDER — FUROSEMIDE 10 MG/ML IJ SOLN
60.0000 mg | Freq: Once | INTRAMUSCULAR | Status: AC
  Administered 2022-08-30: 60 mg via INTRAVENOUS
  Filled 2022-08-30: qty 6

## 2022-08-30 MED ORDER — FAMOTIDINE 20 MG PO TABS: 20.0000 mg | ORAL_TABLET | Freq: Two times a day (BID) | ORAL | Status: DC

## 2022-08-30 MED ORDER — CLOPIDOGREL BISULFATE 75 MG PO TABS
75.0000 mg | ORAL_TABLET | Freq: Every day | ORAL | Status: DC
  Administered 2022-08-30 – 2022-08-31 (×2): 75 mg
  Filled 2022-08-30 (×2): qty 1

## 2022-08-30 MED ORDER — LACTATED RINGERS IV SOLN: INTRAVENOUS | Status: DC

## 2022-08-30 MED ORDER — HEPARIN (PORCINE) IN NACL 1000-0.9 UT/500ML-% IV SOLN
INTRAVENOUS | Status: DC | PRN
  Administered 2022-08-30 (×2): 500 mL

## 2022-08-30 MED ORDER — POTASSIUM CHLORIDE 20 MEQ PO PACK
40.0000 meq | PACK | Freq: Once | ORAL | Status: AC
  Administered 2022-08-30: 40 meq
  Filled 2022-08-30: qty 2

## 2022-08-30 MED ORDER — ORAL CARE MOUTH RINSE: 15.0000 mL | OROMUCOSAL | Status: DC | PRN

## 2022-08-30 MED ORDER — VANCOMYCIN HCL IN DEXTROSE 1-5 GM/200ML-% IV SOLN: 1000.0000 mg | Freq: Once | INTRAVENOUS | Status: DC

## 2022-08-30 MED ORDER — PROPOFOL 1000 MG/100ML IV EMUL
0.0000 ug/kg/min | INTRAVENOUS | Status: DC
  Administered 2022-08-30: 5 ug/kg/min via INTRAVENOUS
  Administered 2022-08-30: 15 ug/kg/min via INTRAVENOUS
  Administered 2022-08-31: 35 ug/kg/min via INTRAVENOUS
  Administered 2022-08-31: 25 ug/kg/min via INTRAVENOUS
  Filled 2022-08-30 (×4): qty 100

## 2022-08-30 MED ORDER — FENTANYL 2500MCG IN NS 250ML (10MCG/ML) PREMIX INFUSION
25.0000 ug/h | INTRAVENOUS | Status: DC
  Administered 2022-08-30: 50 ug/h via INTRAVENOUS
  Filled 2022-08-30: qty 250

## 2022-08-30 MED ORDER — PHENYLEPHRINE HCL-NACL 20-0.9 MG/250ML-% IV SOLN
0.0000 ug/min | INTRAVENOUS | Status: DC
  Administered 2022-08-30: 20 ug/min via INTRAVENOUS
  Administered 2022-08-30: 70 ug/min via INTRAVENOUS
  Administered 2022-08-30: 50 ug/min via INTRAVENOUS
  Administered 2022-08-30: 30 ug/min via INTRAVENOUS
  Administered 2022-08-31: 70 ug/min via INTRAVENOUS
  Filled 2022-08-30 (×4): qty 250

## 2022-08-30 MED ORDER — POLYETHYLENE GLYCOL 3350 17 G PO PACK: 17.0000 g | PACK | Freq: Every day | ORAL | Status: DC | PRN

## 2022-08-30 MED ORDER — HEPARIN BOLUS VIA INFUSION
4000.0000 [IU] | Freq: Once | INTRAVENOUS | Status: AC
  Administered 2022-08-30: 4000 [IU] via INTRAVENOUS

## 2022-08-30 MED ORDER — DOCUSATE SODIUM 50 MG/5ML PO LIQD: 100.0000 mg | Freq: Two times a day (BID) | ORAL | Status: DC

## 2022-08-30 MED ORDER — DOCUSATE SODIUM 50 MG/5ML PO LIQD: 100.0000 mg | Freq: Two times a day (BID) | ORAL | Status: DC | PRN

## 2022-08-30 MED ORDER — INSULIN ASPART 100 UNIT/ML IJ SOLN
0.0000 [IU] | INTRAMUSCULAR | Status: DC
  Administered 2022-08-30 (×2): 2 [IU] via SUBCUTANEOUS
  Administered 2022-08-30: 3 [IU] via SUBCUTANEOUS
  Administered 2022-08-31 – 2022-09-01 (×4): 2 [IU] via SUBCUTANEOUS
  Administered 2022-09-01 (×2): 3 [IU] via SUBCUTANEOUS
  Administered 2022-09-01: 2 [IU] via SUBCUTANEOUS
  Administered 2022-09-01: 3 [IU] via SUBCUTANEOUS
  Administered 2022-09-02: 2 [IU] via SUBCUTANEOUS
  Administered 2022-09-02: 5 [IU] via SUBCUTANEOUS
  Administered 2022-09-02 (×2): 2 [IU] via SUBCUTANEOUS
  Administered 2022-09-02: 3 [IU] via SUBCUTANEOUS
  Administered 2022-09-02 – 2022-09-03 (×4): 2 [IU] via SUBCUTANEOUS
  Administered 2022-09-03: 3 [IU] via SUBCUTANEOUS

## 2022-08-30 MED ORDER — DOCUSATE SODIUM 50 MG/5ML PO LIQD
100.0000 mg | Freq: Two times a day (BID) | ORAL | Status: DC
  Administered 2022-08-30: 100 mg
  Filled 2022-08-30: qty 10

## 2022-08-30 MED ORDER — CHLORHEXIDINE GLUCONATE CLOTH 2 % EX PADS
6.0000 | MEDICATED_PAD | Freq: Every day | CUTANEOUS | Status: DC
  Administered 2022-08-30 – 2022-09-05 (×7): 6 via TOPICAL

## 2022-08-30 MED ORDER — HEPARIN SODIUM (PORCINE) 1000 UNIT/ML IJ SOLN
INTRAMUSCULAR | Status: DC | PRN
  Administered 2022-08-30: 5000 [IU] via INTRAVENOUS

## 2022-08-30 MED ORDER — DOCUSATE SODIUM 100 MG PO CAPS: 100.0000 mg | ORAL_CAPSULE | Freq: Two times a day (BID) | ORAL | Status: DC | PRN

## 2022-08-30 MED ORDER — FUROSEMIDE 10 MG/ML IJ SOLN
INTRAMUSCULAR | Status: DC | PRN
  Administered 2022-08-30: 40 mg via INTRAVENOUS

## 2022-08-30 MED ORDER — FENTANYL BOLUS VIA INFUSION: 25.0000 ug | INTRAVENOUS | Status: DC | PRN

## 2022-08-30 MED ORDER — SODIUM CHLORIDE 0.9 % IV SOLN: 250.0000 mL | INTRAVENOUS | Status: DC | PRN

## 2022-08-30 MED ORDER — ACETAMINOPHEN 325 MG PO TABS: 650.0000 mg | ORAL_TABLET | ORAL | Status: DC | PRN

## 2022-08-30 MED ORDER — ORAL CARE MOUTH RINSE
15.0000 mL | OROMUCOSAL | Status: DC
  Administered 2022-08-30 – 2022-08-31 (×14): 15 mL via OROMUCOSAL

## 2022-08-30 MED ORDER — SODIUM BICARBONATE 8.4 % IV SOLN
INTRAVENOUS | Status: AC
  Filled 2022-08-30: qty 50

## 2022-08-30 SURGICAL SUPPLY — 18 items
CATH INFINITI 5FR ANG PIGTAIL (CATHETERS) IMPLANT
CATH INFINITI AMBI 6FR TG (CATHETERS) IMPLANT
CATH INFINITI JR4 5F (CATHETERS) IMPLANT
CATH SWAN GANZ VIP 7.5F (CATHETERS) IMPLANT
CATH-GARD ARROW CATH SHIELD (MISCELLANEOUS) ×1
DEVICE RAD COMP TR BAND LRG (VASCULAR PRODUCTS) IMPLANT
GLIDESHEATH SLEND SS 6F .021 (SHEATH) IMPLANT
KIT CV 3L 7FR 20CM SULFAFREE (SET/KITS/TRAYS/PACK) IMPLANT
KIT SYRINGE INJ CVI SPIKEX1 (MISCELLANEOUS) IMPLANT
PACK CARDIAC CATHETERIZATION (CUSTOM PROCEDURE TRAY) ×1 IMPLANT
PROTECTION STATION PRESSURIZED (MISCELLANEOUS) ×1
SET ATX-X65L (MISCELLANEOUS) IMPLANT
SHEATH PROBE COVER 6X72 (BAG) IMPLANT
SHIELD CATHGARD ARROW (MISCELLANEOUS) IMPLANT
STATION PROTECTION PRESSURIZED (MISCELLANEOUS) IMPLANT
TUBING CIL FLEX 10 FLL-RA (TUBING) IMPLANT
WIRE EMERALD 3MM-J .035X260CM (WIRE) IMPLANT
WIRE MICRO SET SILHO 5FR 7 (SHEATH) IMPLANT

## 2022-08-30 NOTE — Interval H&P Note (Signed)
History and Physical Interval Note:  08/30/2022 10:43 AM  Gloria Sharp  has presented today for surgery, with the diagnosis of aortic stenosis.  The various methods of treatment have been discussed with the patient and family. After consideration of risks, benefits and other options for treatment, the patient has consented to  Procedure(s): RIGHT/LEFT HEART CATH AND CORONARY ANGIOGRAPHY (N/A) as a surgical intervention.  The patient's history has been reviewed, patient examined, no change in status, stable for surgery.  I have reviewed the patient's chart and labs.  Questions were answered to the patient's satisfaction.     Orbie Pyo

## 2022-08-30 NOTE — Progress Notes (Signed)
Pt transported from ED [facility] to CT and back with no complications.

## 2022-08-30 NOTE — Progress Notes (Addendum)
Progress Note  Patient Name: Gloria Sharp Date of Encounter: 08/30/2022  [facility] Cardiologist: Gypsy Balsam, MD     Subjective   Events of early this am noted.  Admitted with acute respiratory failure and severe back pain as well as LOC.  Initially STEMI called but cancelled. 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   Acidotic on admission.  Now on phenylephrine, Amio and Heparin gtts.  Acidotic.  SCr 1.2, wBC elevated at 16K on arrival.  Recent admission 5/1-05/17/22 for chest pain. LHC showed moderate 2-vessel disease with mid LAD 65% and distal Cx 65%. At that point, opted for medical management given PCI of the LAD would require long stent and jailing large D2. TTE showed normal EF, mild MS, moderate AS (PV = 3, AVA = 1.1, MG = 20, PG = 34, DI = 0.39) .  Has chronic LBBB.  hsTrop 584>> 1948.  Chest CTA with no dissection.  Ground glass opacity in lungs diffusely c/w pulmonary edema and possible multifocal PNA.    BP remains soft at 101/73mmHg and HR in 80's.  O2 sats 100% on vent 90% FIO2.  2D echo pending.    Inpatient Medications    Scheduled Meds:  atorvastatin  80 mg Per Tube Daily   Chlorhexidine Gluconate Cloth  6 each Topical Daily   clopidogrel  75 mg Per Tube Daily   docusate  100 mg Per Tube BID   famotidine  20 mg Per Tube BID   insulin aspart  0-15 Units Subcutaneous Q4H   ipratropium-albuterol  3 mL Nebulization Q6H   mouth rinse  15 mL Mouth Rinse Q2H   pantoprazole (PROTONIX) IV  40 mg Intravenous Q24H   polyethylene glycol  17 g Per Tube Daily   Continuous Infusions:  aztreonam     fentaNYL infusion INTRAVENOUS 50 mcg/hr (08/30/22 0722)   heparin 850 Units/hr (08/30/22 0639)   phenylephrine (NEO-SYNEPHRINE) Adult infusion 30 mcg/min (08/30/22 0735)   propofol (DIPRIVAN) infusion 5 mcg/kg/min (08/30/22 0727)   PRN Meds: docusate, fentaNYL, fentaNYL (SUBLIMAZE) injection, fentaNYL (SUBLIMAZE) injection, ipratropium-albuterol, ondansetron  (ZOFRAN) IV, mouth rinse, polyethylene glycol   Vital Signs    Vitals:   08/30/22 0845 08/30/22 0850 08/30/22 0855 08/30/22 0900  BP: (!) 99/52 (!) 102/54 (!) 103/53 (!) 101/54  Pulse: 83 84 83 82  Resp: (!) 28 (!) 28 (!) 28 (!) 28  Temp: 98.4 F (36.9 C) 98.4 F (36.9 C) 98.4 F (36.9 C) 98.6 F (37 C)  TempSrc:      SpO2: 100% 100% 100% 100%  Weight:      Height:        Intake/Output Summary (Last 24 hours) at 08/30/2022 0913 Last data filed at 08/30/2022 0800 Gross per 24 hour  Intake 100 ml  Output 325 ml  Net -225 ml      08/30/2022    3:39 AM 05/24/2022    8:45 AM 05/15/2022    5:56 PM  Last 3 Weights  Weight (lbs) 212 lb 15.4 oz 210 lb 212 lb 4.9 oz  Weight (kg) 96.6 kg 95.255 kg 96.3 kg      Telemetry    NSR with LBBB - Personally Reviewed  ECG    Sinus tachycardia with LBBB - Personally Reviewed  Physical Exam   GEN: intubated and sedated  Ill appearing.   Neck: JVD difficult to assess Cardiac: RRR, no murmurs, rubs, or gallops.  Respiratory: Clear to auscultation bilaterally anteriorly GI: Soft, nontender, non-distended  MS: No edema; No deformity. Neuro:  cannot assess Psych: cannot assess   Labs    High Sensitivity Troponin:   Recent Labs  Lab 08/30/22 0322 08/30/22 0549  TROPONINIHS 584* 1,948*      Chemistry Recent Labs  Lab 08/30/22 0322 08/30/22 0450 08/30/22 0824  NA 135  139 136 137  K 4.7  4.7 4.3 4.3  CL 106  105  --   --   CO2 15*  --   --   GLUCOSE 295*  300*  --   --   BUN 25*  27*  --   --   CREATININE 1.07*  1.20*  --   --   CALCIUM 8.6*  --   --   PROT 6.3*  --   --   ALBUMIN 3.3*  --   --   AST 42*  --   --   ALT 28  --   --   ALKPHOS 75  --   --   BILITOT 0.7  --   --   GFRNONAA 54*  --   --   ANIONGAP 14  --   --      Hematology Recent Labs  Lab 08/30/22 0322 08/30/22 0450 08/30/22 0824  WBC 16.2*  --   --   RBC 4.07  --   --   HGB 10.9*  12.6 10.5* 10.9*  HCT 36.5  37.0 31.0* 32.0*  MCV  89.7  --   --   MCH 26.8  --   --   MCHC 29.9*  --   --   RDW 14.7  --   --   PLT 517*  --   --     BNP Recent Labs  Lab 08/30/22 0322  BNP 458.3*     DDimer No results for input(s): "DDIMER" in the last 168 hours.   Radiology    DG Chest Portable 1 View  Result Date: 08/30/2022 CLINICAL DATA:  Central line placement EXAM: PORTABLE CHEST 1 VIEW COMPARISON:  Earlier today FINDINGS: Endotracheal tube with tip between the clavicular heads and carina. An enteric tube at least reaches the stomach. Right IJ line with tip at the SVC. Normal heart size and mediastinal contours. Kerley lines and hazy airspace opacity, reference preceding CT. No pneumothorax. IMPRESSION: 1. No complicating features of central line and enteric tube placement. 2. Stable aeration. Electronically Signed   By: Tiburcio Pea M.D.   On: 08/30/2022 05:56   CT Angio Chest/Abd/Pel for Dissection W and/or Wo Contrast  Result Date: 08/30/2022 CLINICAL DATA:  Severe back pain before becoming unresponsive. EXAM: CT ANGIOGRAPHY CHEST, ABDOMEN AND PELVIS TECHNIQUE: Non-contrast CT of the chest was initially obtained. Multidetector CT imaging through the chest, abdomen and pelvis was performed using the standard protocol during bolus administration of intravenous contrast. Multiplanar reconstructed images and MIPs were obtained and reviewed to evaluate the vascular anatomy. RADIATION DOSE REDUCTION: This exam was performed according to the departmental dose-optimization program which includes automated exposure control, adjustment of the mA and/or kV according to patient size and/or use of iterative reconstruction technique. CONTRAST:  75mL OMNIPAQUE IOHEXOL 350 MG/ML SOLN COMPARISON:  Abdomen and pelvis CT 08/22/2022 FINDINGS: CTA CHEST FINDINGS Cardiovascular: Pre contrast imaging shows no hyperdense crescent in the wall of the thoracic aorta to suggest the presence of an acute intramural hematoma. Heart size upper normal. No  substantial pericardial effusion. Coronary artery calcification is evident. Mild atherosclerotic calcification is noted in the wall of the thoracic  aorta. Mediastinum/Nodes: 2 cm right thyroid nodule. This has been evaluated on previous imaging. (ref: J Am Coll Radiol. 2015 Feb;12(2): 143-50). 11 mm short axis precarinal lymph node is upper normal for size. Upper normal hilar lymph nodes evident bilaterally. The esophagus has normal imaging features. There is no axillary lymphadenopathy. Lungs/Pleura: Endotracheal tube evident. Interlobular septal thickening noted in both lungs with mosaic ground-glass opacity in the lungs diffusely in dense consolidative airspace disease in the dependent lower lungs bilaterally. Small bilateral pleural effusions evident. Musculoskeletal: No worrisome lytic or sclerotic osseous abnormality. Review of the MIP images confirms the above findings. CTA ABDOMEN AND PELVIS FINDINGS VASCULAR Aorta: Normal caliber aorta without aneurysm, dissection, vasculitis or significant stenosis. Celiac: Patent without evidence of aneurysm, dissection, vasculitis or significant stenosis. SMA: Patent without evidence of aneurysm, dissection, vasculitis or significant stenosis. Renals: Both renal arteries are patent without evidence of aneurysm, dissection, vasculitis, fibromuscular dysplasia or significant stenosis. IMA: Patent without evidence of aneurysm, dissection, vasculitis or significant stenosis. Inflow: Patent without evidence of aneurysm, dissection, vasculitis or significant stenosis. Veins: No obvious venous abnormality within the limitations of this arterial phase study. Review of the MIP images confirms the above findings. NON-VASCULAR Hepatobiliary: The liver shows diffusely decreased attenuation suggesting fat deposition. Gallbladder is surgically absent. No intrahepatic or extrahepatic biliary dilation. Pancreas: No focal mass lesion. No dilatation of the main duct. No intraparenchymal  cyst. No peripancreatic edema. Spleen: No splenomegaly. No suspicious focal mass lesion. Adrenals/Urinary Tract: No adrenal nodule or mass. Right kidney unremarkable. 1.3 cm low-density lesion lower pole left kidney was characterized as a benign simple cyst on MRI 11/21/2020. No followup imaging is recommended. No evidence for hydroureter. Foley catheter decompresses the urinary bladder. Stomach/Bowel: NG tube tip is in the mid stomach. Duodenum is normally positioned as is the ligament of Treitz. No small bowel wall thickening. No small bowel dilatation. The terminal ileum is normal. Nonvisualization of the appendix is consistent with the reported history of appendectomy. No gross colonic mass. No colonic wall thickening. Diverticular changes are noted in the left colon without evidence of diverticulitis. Lymphatic: There is no gastrohepatic or hepatoduodenal ligament lymphadenopathy. No retroperitoneal or mesenteric lymphadenopathy. No pelvic sidewall lymphadenopathy. Reproductive: Hysterectomy.  There is no adnexal mass. Other: No intraperitoneal free fluid. Musculoskeletal: Pelvic floor laxity evident. No worrisome lytic or sclerotic osseous abnormality. Review of the MIP images confirms the above findings. IMPRESSION: 1. No evidence for thoracic or abdominal aortic aneurysm or dissection. 2. Interlobular septal thickening with mosaic ground-glass opacity in the lungs diffusely. Imaging features suggest pulmonary edema. 3. Dense consolidative airspace disease in the dependent lower lungs bilaterally with small bilateral pleural effusions. Imaging features compatible with multifocal pneumonia. 4. Hepatic steatosis. 5. Left colonic diverticulosis without diverticulitis. 6. Pelvic floor laxity. 7.  Aortic Atherosclerosis (ICD10-I70.0). Electronically Signed   By: Kennith Center M.D.   On: 08/30/2022 05:17   CT Head Wo Contrast  Result Date: 08/30/2022 CLINICAL DATA:  Blunt poly trauma with unresponsiveness.  EXAM: CT HEAD WITHOUT CONTRAST TECHNIQUE: Contiguous axial images were obtained from the base of the skull through the vertex without intravenous contrast. RADIATION DOSE REDUCTION: This exam was performed according to the departmental dose-optimization program which includes automated exposure control, adjustment of the mA and/or kV according to patient size and/or use of iterative reconstruction technique. COMPARISON:  06/05/2021 FINDINGS: Brain: No evidence of acute infarction, hemorrhage, hydrocephalus, extra-axial collection or mass lesion/mass effect. Patchy low-density in the cerebral white matter attributed to chronic small vessel  ischemia, moderately extensive. Vascular: No hyperdense vessel or unexpected calcification. Skull: Normal. Negative for fracture or focal lesion. Sinuses/Orbits: No acute finding.  Bilateral cataract resection. IMPRESSION: 1. No acute finding. 2. 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 Electronically Signed   By: Tiburcio Pea M.D.   On: 08/30/2022 04:56   DG Chest Portable 1 View  Result Date: 08/30/2022 CLINICAL DATA:  Check endotracheal tube placement EXAM: PORTABLE CHEST 1 VIEW COMPARISON:  05/15/2022 FINDINGS: Endotracheal tube is noted in satisfactory position. Cardiac shadow is stable. Aortic calcifications are again seen. Mild vascular congestion is noted. Diffuse increased density is noted throughout the right lung which may represent asymmetric edema or focal infiltrate. IMPRESSION: Endotracheal tube in satisfactory position. Vascular congestion is noted. Increased density is noted in the right lung as described. Electronically Signed   By: Alcide Clever M.D.   On: 08/30/2022 03:46    Patient Profile     77 y.o. female with a hx of aortic stenosis, DM, HTN, HL, CVA, NSVT who is being seen 08/30/2022 for the evaluation of abnormal EKG at the request of ED.   Assessment & Plan    #Acute Hypoxemic Respiratory Failure/VDRF #Acute  CHF #Elevated Troponin #Shock #CAD #HLD -unclear what inciting event was but apparently has sudden on set of SOB and became unconscious in the field and emergently intubated.  -no hx of recent fever, chills, CP or SOB prior to this -she has known borderline LAD lesion on cath in May 2024 with a long 65-70% mid LAD lesion and mid Left Cx -She also has known moderate AS and mild MS by echo in May 24 -2D echo in May 24 with normal LVF -hs Troponin elevated and trend c/w NSTEMI -? Etiology of acute decompensation . No CP prior to the event. ? Whether AS has progressed to severe or whether she had ACS with plaque rupture/progression of CAD.  Her renal function is normal so I think we need to proceed with right and left heart cath today to redefine coronary anatomy and assess filling pressures and RH pressures. -Informed Consent   Shared Decision Making/Informed Consent The risks [stroke (1 in 1000), death (1 in 1000), kidney failure [usually temporary] (1 in 500), bleeding (1 in 200), allergic reaction [possibly serious] (1 in 200)], benefits (diagnostic support and management of coronary artery disease) and alternatives of a cardiac catheterization were discussed in detail with Ms. Rissmiller's husband and Daughter as the patient is sedated and intubated and they are  willing to proceed. 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   Critical care was necessary to treat or prevent imminent or life-threatening deterioration.   Critical care was time  spent personally by me (independent of APPs or residents) on the following activities: development of treatment plan with patient and/or surrogate as well as nursing, discussions with consultants, evaluation of patient's response to treatment, examination of patient, obtaining history from patient or surrogate, ordering and performing treatments and interventions, ordering and review of laboratory studies, ordering and review of  radiographic studies, pulse oximetry and re-evaluation of patient's condition.   For questions or updates, please contact [facility] Please consult [email or web address] for contact info under        Signed, Armanda Magic, MD  08/30/2022, 9:13 AM

## 2022-08-30 NOTE — ED Triage Notes (Signed)
Pt brought by EMS from home. Pt initially had severe back pain, lied down to go to sleep, and became unresponsive per EMS. 30% on RA on EMS arrival to patient's home. O2 in 70s for EMS with BMV. EMS called out as STEMI, cardiology canceled STEMI after seeing EKG. EMS report elevation in anterior and septal leads and depression in leads 2,3, and AVF. Capno - 45 for EMS. CBG - 220. Lung sounds absent, breathing shallow.

## 2022-08-30 NOTE — Procedures (Signed)
Patient Name: Gloria Sharp  MRN: [id number]  Epilepsy Attending: Charlsie Quest  Referring Physician/Provider: Steffanie Dunn, DO  Date: 07/30/2022 Duration: 23.56 mins  Patient history: 77yo F with ams getting eeg to evaluate for seizure.  Level of alertness: comatose  AEDs during EEG study: Propofol  Technical aspects: This EEG study was done with scalp electrodes positioned according to the 10-20 International system of electrode placement. Electrical activity was reviewed with band pass filter of 1-70Hz , sensitivity of 7 uV/mm, display speed of 17mm/sec with a 60Hz  notched filter applied as appropriate. EEG data were recorded continuously and digitally stored.  Video monitoring was available and reviewed as appropriate.  Description:  EEG showed continuous generalized 3 to 6 Hz theta-delta slowing admixed with 15 to 18 Hz beta activity distributed symmetrically and diffusely.  Sharp transients were seen in left centro-temporal region.Hyperventilation and photic stimulation were not performed.     ABNORMALITY - Continuous slow, generalized  IMPRESSION: This study is suggestive of severe diffuse encephalopathy, nonspecific etiology. No seizures or definite epileptiform discharges were seen throughout the recording.   Gloria Sharp

## 2022-08-30 NOTE — Progress Notes (Signed)
ED Pharmacy Antibiotic Sign Off An antibiotic consult was received from an ED provider for Vancomycin/Aztreonam per pharmacy dosing for sepsis. A chart review was completed to assess appropriateness.   The following one time order(s) were placed:  Vancomycin 1500 mg IV x 1 Aztreonam 2g IV x 1  Further antibiotic and/or antibiotic pharmacy consults should be ordered by the admitting provider if indicated.   Thank you for allowing pharmacy to be a part of this patient's care.   Abran Duke, PharmD, BCPS Clinical Pharmacist Phone: [phone]

## 2022-08-30 NOTE — Progress Notes (Signed)
Called to do EEG.  Patient going down for a procedure.  Call after 12:45  to go back and hook patient up for EEG.

## 2022-08-30 NOTE — ED Notes (Signed)
(+)   lung sounds in bilateral lungs after airway placement.

## 2022-08-30 NOTE — Progress Notes (Signed)
Set up machine for EEG.  Nurse came in and advised that the patient is having a heart catheterization.   Will go back to do EEG afterwards, allowing about 30 minutes.

## 2022-08-30 NOTE — Progress Notes (Addendum)
eLink Physician-Brief Progress Note Patient Name: Gloria Sharp DOB: May 22, 1945 MRN: [id number]   Date of Service  08/30/2022  HPI/Events of Note  Patient admitted with acute pulmonary edema , altered mental status, and acute hypoxemic respiratory failure in the context of uncontrolled hypertension and a known history of coronary artery disease. Troponin increased from baseline of 584 to > 1900.  eICU Interventions  New Patient Evaluation. Heparin gtt, Plavix, Echocardiogram, Lipitor, Cardiology consultation.        Migdalia Dk 08/30/2022, 6:36 AM

## 2022-08-30 NOTE — Progress Notes (Signed)
Transported patient from emergency room to [facility]. Patient remained stable while on the ventilator.

## 2022-08-30 NOTE — Progress Notes (Signed)
EEG complete - results pending 

## 2022-08-30 NOTE — Progress Notes (Signed)
ANTICOAGULATION CONSULT NOTE   Pharmacy Consult for heparin Indication: chest pain/ACS  Allergies  Allergen Reactions   Celebrex [Celecoxib] Itching and Swelling    Swelling throughout body Skin redness    Keflex [Cephalexin] Itching    09/2018 note: tolerated Augmentin   Relafen [Nabumetone] Itching and Swelling   Cipro [Ciprofloxacin Hcl] Hives, Itching and Rash   Flagyl [Metronidazole] Itching, Swelling and Rash    Patient Measurements: Height: 5' (152.4 cm) Weight: 96.6 kg (212 lb 15.4 oz) IBW/kg (Calculated) : 45.5 Heparin Dosing Weight: 68.8 kg  Vital Signs: Temp: 98.6 F (37 C) (08/15 0900) Temp Source: Bladder (08/15 0800) BP: 143/122 (08/15 1330) Pulse Rate: 0 (08/15 1345)  Labs: Recent Labs    08/30/22 0322 08/30/22 0450 08/30/22 0549 08/30/22 0824 08/30/22 1249 08/30/22 1251 08/30/22 1258  HGB 10.9*  12.6   < >  --    < > 10.2* 10.2* 9.9*  HCT 36.5  37.0   < >  --    < > 30.0* 30.0* 29.0*  PLT 517*  --   --   --   --   --   --   APTT 31  --   --   --   --   --   --   LABPROT 16.7*  --   --   --   --   --   --   INR 1.3*  --   --   --   --   --   --   CREATININE 1.07*  1.20*  --   --   --   --   --   --   TROPONINIHS 584*  --  1,948*  --   --   --   --    < > = values in this interval not displayed.    Estimated Creatinine Clearance: 47.3 mL/min (A) (by C-G formula based on SCr of 1.07 mg/dL (H)).   Medical History: Past Medical History:  Diagnosis Date   AKI (acute kidney injury) (HCC) 03/27/2021   Angina pectoris (HCC) 05/15/2022   Aortic stenosis 09/04/2019   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 sciatica 02/12/2022   Class 3 obesity (HCC) 03/26/2021   CVA (cerebral vascular accident) (HCC) 2015?   Cystocele with uterine prolapse 01/17/2018   Diabetes (HCC)    Dizziness 03/27/2021   Dyslipidemia 10/10/2018   Essential hypertension 10/10/2018   Fibromyalgia    GERD (gastroesophageal reflux disease)    Heart murmur 10/10/2018    History of CVA (cerebrovascular accident) 10/10/2018   Hydronephrosis of right kidney 03/26/2021   Hypercholesterolemia    Hypertension    Hypokalemia 03/27/2021   Leukocytosis    Mitral regurgitation 11/24/2018   Nonsustained ventricular tachycardia (HCC) 11/24/2018   Obesity    Post-operative nausea and vomiting    "hard to wake up"   Rectocele 01/17/2018   Sacroiliitis (HCC) 02/12/2022   Sepsis secondary to UTI (HCC) 03/26/2021   Type 2 diabetes mellitus with hyperglycemia (HCC) 03/26/2021   Type 2 diabetes mellitus without complication, without long-term current use of insulin (HCC) 10/10/2018   Assessment: [name] presented to the ED with severe back pain and became unresponsive with EMS. No anticoagulants noted PTA, She is now s/p cath  -Heparin to restart 2 hours after TR band removal. Prior rate was 850 units/hr  Goal of Therapy:  Heparin level 0.3-0.7 units/ml Monitor platelets by anticoagulation protocol: Yes   Plan:  -restart heparin 850 units/hr 2 hours after  TR band removal -Heparin level in 8 hours and daily wth CBC daily  Harland German, PharmD Clinical Pharmacist **Pharmacist phone directory can now be found on [email or web address] ([email or web address]).  Listed under [facility].

## 2022-08-30 NOTE — Progress Notes (Signed)
Echocardiogram 2D Echocardiogram has been performed.  Warren Lacy  RDCS 08/30/2022, 10:15 AM  Dr Gala Romney notified of stat

## 2022-08-30 NOTE — H&P (View-Only) (Signed)
Progress Note  Patient Name: Gloria Sharp Date of Encounter: 08/30/2022  [facility] Cardiologist: Gypsy Balsam, MD     Subjective   Events of early this am noted.  Admitted with acute respiratory failure and severe back pain as well as LOC.  Initially STEMI called but cancelled. 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   Acidotic on admission.  Now on phenylephrine, Amio and Heparin gtts.  Acidotic.  SCr 1.2, wBC elevated at 16K on arrival.  Recent admission 5/1-05/17/22 for chest pain. LHC showed moderate 2-vessel disease with mid LAD 65% and distal Cx 65%. At that point, opted for medical management given PCI of the LAD would require long stent and jailing large D2. TTE showed normal EF, mild MS, moderate AS (PV = 3, AVA = 1.1, MG = 20, PG = 34, DI = 0.39) .  Has chronic LBBB.  hsTrop 584>> 1948.  Chest CTA with no dissection.  Ground glass opacity in lungs diffusely c/w pulmonary edema and possible multifocal PNA.    BP remains soft at 101/73mmHg and HR in 80's.  O2 sats 100% on vent 90% FIO2.  2D echo pending.    Inpatient Medications    Scheduled Meds:  atorvastatin  80 mg Per Tube Daily   Chlorhexidine Gluconate Cloth  6 each Topical Daily   clopidogrel  75 mg Per Tube Daily   docusate  100 mg Per Tube BID   famotidine  20 mg Per Tube BID   insulin aspart  0-15 Units Subcutaneous Q4H   ipratropium-albuterol  3 mL Nebulization Q6H   mouth rinse  15 mL Mouth Rinse Q2H   pantoprazole (PROTONIX) IV  40 mg Intravenous Q24H   polyethylene glycol  17 g Per Tube Daily   Continuous Infusions:  aztreonam     fentaNYL infusion INTRAVENOUS 50 mcg/hr (08/30/22 0722)   heparin 850 Units/hr (08/30/22 0639)   phenylephrine (NEO-SYNEPHRINE) Adult infusion 30 mcg/min (08/30/22 0735)   propofol (DIPRIVAN) infusion 5 mcg/kg/min (08/30/22 0727)   PRN Meds: docusate, fentaNYL, fentaNYL (SUBLIMAZE) injection, fentaNYL (SUBLIMAZE) injection, ipratropium-albuterol, ondansetron  (ZOFRAN) IV, mouth rinse, polyethylene glycol   Vital Signs    Vitals:   08/30/22 0845 08/30/22 0850 08/30/22 0855 08/30/22 0900  BP: (!) 99/52 (!) 102/54 (!) 103/53 (!) 101/54  Pulse: 83 84 83 82  Resp: (!) 28 (!) 28 (!) 28 (!) 28  Temp: 98.4 F (36.9 C) 98.4 F (36.9 C) 98.4 F (36.9 C) 98.6 F (37 C)  TempSrc:      SpO2: 100% 100% 100% 100%  Weight:      Height:        Intake/Output Summary (Last 24 hours) at 08/30/2022 0913 Last data filed at 08/30/2022 0800 Gross per 24 hour  Intake 100 ml  Output 325 ml  Net -225 ml      08/30/2022    3:39 AM 05/24/2022    8:45 AM 05/15/2022    5:56 PM  Last 3 Weights  Weight (lbs) 212 lb 15.4 oz 210 lb 212 lb 4.9 oz  Weight (kg) 96.6 kg 95.255 kg 96.3 kg      Telemetry    NSR with LBBB - Personally Reviewed  ECG    Sinus tachycardia with LBBB - Personally Reviewed  Physical Exam   GEN: intubated and sedated  Ill appearing.   Neck: JVD difficult to assess Cardiac: RRR, no murmurs, rubs, or gallops.  Respiratory: Clear to auscultation bilaterally anteriorly GI: Soft, nontender, non-distended  MS: No edema; No deformity. Neuro:  cannot assess Psych: cannot assess   Labs    High Sensitivity Troponin:   Recent Labs  Lab 08/30/22 0322 08/30/22 0549  TROPONINIHS 584* 1,948*      Chemistry Recent Labs  Lab 08/30/22 0322 08/30/22 0450 08/30/22 0824  NA 135  139 136 137  K 4.7  4.7 4.3 4.3  CL 106  105  --   --   CO2 15*  --   --   GLUCOSE 295*  300*  --   --   BUN 25*  27*  --   --   CREATININE 1.07*  1.20*  --   --   CALCIUM 8.6*  --   --   PROT 6.3*  --   --   ALBUMIN 3.3*  --   --   AST 42*  --   --   ALT 28  --   --   ALKPHOS 75  --   --   BILITOT 0.7  --   --   GFRNONAA 54*  --   --   ANIONGAP 14  --   --      Hematology Recent Labs  Lab 08/30/22 0322 08/30/22 0450 08/30/22 0824  WBC 16.2*  --   --   RBC 4.07  --   --   HGB 10.9*  12.6 10.5* 10.9*  HCT 36.5  37.0 31.0* 32.0*  MCV  89.7  --   --   MCH 26.8  --   --   MCHC 29.9*  --   --   RDW 14.7  --   --   PLT 517*  --   --     BNP Recent Labs  Lab 08/30/22 0322  BNP 458.3*     DDimer No results for input(s): "DDIMER" in the last 168 hours.   Radiology    DG Chest Portable 1 View  Result Date: 08/30/2022 CLINICAL DATA:  Central line placement EXAM: PORTABLE CHEST 1 VIEW COMPARISON:  Earlier today FINDINGS: Endotracheal tube with tip between the clavicular heads and carina. An enteric tube at least reaches the stomach. Right IJ line with tip at the SVC. Normal heart size and mediastinal contours. Kerley lines and hazy airspace opacity, reference preceding CT. No pneumothorax. IMPRESSION: 1. No complicating features of central line and enteric tube placement. 2. Stable aeration. Electronically Signed   By: Tiburcio Pea M.D.   On: 08/30/2022 05:56   CT Angio Chest/Abd/Pel for Dissection W and/or Wo Contrast  Result Date: 08/30/2022 CLINICAL DATA:  Severe back pain before becoming unresponsive. EXAM: CT ANGIOGRAPHY CHEST, ABDOMEN AND PELVIS TECHNIQUE: Non-contrast CT of the chest was initially obtained. Multidetector CT imaging through the chest, abdomen and pelvis was performed using the standard protocol during bolus administration of intravenous contrast. Multiplanar reconstructed images and MIPs were obtained and reviewed to evaluate the vascular anatomy. RADIATION DOSE REDUCTION: This exam was performed according to the departmental dose-optimization program which includes automated exposure control, adjustment of the mA and/or kV according to patient size and/or use of iterative reconstruction technique. CONTRAST:  75mL OMNIPAQUE IOHEXOL 350 MG/ML SOLN COMPARISON:  Abdomen and pelvis CT 08/22/2022 FINDINGS: CTA CHEST FINDINGS Cardiovascular: Pre contrast imaging shows no hyperdense crescent in the wall of the thoracic aorta to suggest the presence of an acute intramural hematoma. Heart size upper normal. No  substantial pericardial effusion. Coronary artery calcification is evident. Mild atherosclerotic calcification is noted in the wall of the thoracic  aorta. Mediastinum/Nodes: 2 cm right thyroid nodule. This has been evaluated on previous imaging. (ref: J Am Coll Radiol. 2015 Feb;12(2): 143-50). 11 mm short axis precarinal lymph node is upper normal for size. Upper normal hilar lymph nodes evident bilaterally. The esophagus has normal imaging features. There is no axillary lymphadenopathy. Lungs/Pleura: Endotracheal tube evident. Interlobular septal thickening noted in both lungs with mosaic ground-glass opacity in the lungs diffusely in dense consolidative airspace disease in the dependent lower lungs bilaterally. Small bilateral pleural effusions evident. Musculoskeletal: No worrisome lytic or sclerotic osseous abnormality. Review of the MIP images confirms the above findings. CTA ABDOMEN AND PELVIS FINDINGS VASCULAR Aorta: Normal caliber aorta without aneurysm, dissection, vasculitis or significant stenosis. Celiac: Patent without evidence of aneurysm, dissection, vasculitis or significant stenosis. SMA: Patent without evidence of aneurysm, dissection, vasculitis or significant stenosis. Renals: Both renal arteries are patent without evidence of aneurysm, dissection, vasculitis, fibromuscular dysplasia or significant stenosis. IMA: Patent without evidence of aneurysm, dissection, vasculitis or significant stenosis. Inflow: Patent without evidence of aneurysm, dissection, vasculitis or significant stenosis. Veins: No obvious venous abnormality within the limitations of this arterial phase study. Review of the MIP images confirms the above findings. NON-VASCULAR Hepatobiliary: The liver shows diffusely decreased attenuation suggesting fat deposition. Gallbladder is surgically absent. No intrahepatic or extrahepatic biliary dilation. Pancreas: No focal mass lesion. No dilatation of the main duct. No intraparenchymal  cyst. No peripancreatic edema. Spleen: No splenomegaly. No suspicious focal mass lesion. Adrenals/Urinary Tract: No adrenal nodule or mass. Right kidney unremarkable. 1.3 cm low-density lesion lower pole left kidney was characterized as a benign simple cyst on MRI 11/21/2020. No followup imaging is recommended. No evidence for hydroureter. Foley catheter decompresses the urinary bladder. Stomach/Bowel: NG tube tip is in the mid stomach. Duodenum is normally positioned as is the ligament of Treitz. No small bowel wall thickening. No small bowel dilatation. The terminal ileum is normal. Nonvisualization of the appendix is consistent with the reported history of appendectomy. No gross colonic mass. No colonic wall thickening. Diverticular changes are noted in the left colon without evidence of diverticulitis. Lymphatic: There is no gastrohepatic or hepatoduodenal ligament lymphadenopathy. No retroperitoneal or mesenteric lymphadenopathy. No pelvic sidewall lymphadenopathy. Reproductive: Hysterectomy.  There is no adnexal mass. Other: No intraperitoneal free fluid. Musculoskeletal: Pelvic floor laxity evident. No worrisome lytic or sclerotic osseous abnormality. Review of the MIP images confirms the above findings. IMPRESSION: 1. No evidence for thoracic or abdominal aortic aneurysm or dissection. 2. Interlobular septal thickening with mosaic ground-glass opacity in the lungs diffusely. Imaging features suggest pulmonary edema. 3. Dense consolidative airspace disease in the dependent lower lungs bilaterally with small bilateral pleural effusions. Imaging features compatible with multifocal pneumonia. 4. Hepatic steatosis. 5. Left colonic diverticulosis without diverticulitis. 6. Pelvic floor laxity. 7.  Aortic Atherosclerosis (ICD10-I70.0). Electronically Signed   By: Kennith Center M.D.   On: 08/30/2022 05:17   CT Head Wo Contrast  Result Date: 08/30/2022 CLINICAL DATA:  Blunt poly trauma with unresponsiveness.  EXAM: CT HEAD WITHOUT CONTRAST TECHNIQUE: Contiguous axial images were obtained from the base of the skull through the vertex without intravenous contrast. RADIATION DOSE REDUCTION: This exam was performed according to the departmental dose-optimization program which includes automated exposure control, adjustment of the mA and/or kV according to patient size and/or use of iterative reconstruction technique. COMPARISON:  06/05/2021 FINDINGS: Brain: No evidence of acute infarction, hemorrhage, hydrocephalus, extra-axial collection or mass lesion/mass effect. Patchy low-density in the cerebral white matter attributed to chronic small vessel  ischemia, moderately extensive. Vascular: No hyperdense vessel or unexpected calcification. Skull: Normal. Negative for fracture or focal lesion. Sinuses/Orbits: No acute finding.  Bilateral cataract resection. IMPRESSION: 1. No acute finding. 2. 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 Electronically Signed   By: Tiburcio Pea M.D.   On: 08/30/2022 04:56   DG Chest Portable 1 View  Result Date: 08/30/2022 CLINICAL DATA:  Check endotracheal tube placement EXAM: PORTABLE CHEST 1 VIEW COMPARISON:  05/15/2022 FINDINGS: Endotracheal tube is noted in satisfactory position. Cardiac shadow is stable. Aortic calcifications are again seen. Mild vascular congestion is noted. Diffuse increased density is noted throughout the right lung which may represent asymmetric edema or focal infiltrate. IMPRESSION: Endotracheal tube in satisfactory position. Vascular congestion is noted. Increased density is noted in the right lung as described. Electronically Signed   By: Alcide Clever M.D.   On: 08/30/2022 03:46    Patient Profile     77 y.o. female with a hx of aortic stenosis, DM, HTN, HL, CVA, NSVT who is being seen 08/30/2022 for the evaluation of abnormal EKG at the request of ED.   Assessment & Plan    #Acute Hypoxemic Respiratory Failure/VDRF #Acute  CHF #Elevated Troponin #Shock #CAD #HLD -unclear what inciting event was but apparently has sudden on set of SOB and became unconscious in the field and emergently intubated.  -no hx of recent fever, chills, CP or SOB prior to this -she has known borderline LAD lesion on cath in May 2024 with a long 65-70% mid LAD lesion and mid Left Cx -She also has known moderate AS and mild MS by echo in May 24 -2D echo in May 24 with normal LVF -hs Troponin elevated and trend c/w NSTEMI -? Etiology of acute decompensation . No CP prior to the event. ? Whether AS has progressed to severe or whether she had ACS with plaque rupture/progression of CAD.  Her renal function is normal so I think we need to proceed with right and left heart cath today to redefine coronary anatomy and assess filling pressures and RH pressures. -Informed Consent   Shared Decision Making/Informed Consent The risks [stroke (1 in 1000), death (1 in 1000), kidney failure [usually temporary] (1 in 500), bleeding (1 in 200), allergic reaction [possibly serious] (1 in 200)], benefits (diagnostic support and management of coronary artery disease) and alternatives of a cardiac catheterization were discussed in detail with Ms. Rissmiller's husband and Daughter as the patient is sedated and intubated and they are  willing to proceed. 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   Critical care was necessary to treat or prevent imminent or life-threatening deterioration.   Critical care was time  spent personally by me (independent of APPs or residents) on the following activities: development of treatment plan with patient and/or surrogate as well as nursing, discussions with consultants, evaluation of patient's response to treatment, examination of patient, obtaining history from patient or surrogate, ordering and performing treatments and interventions, ordering and review of laboratory studies, ordering and review of  radiographic studies, pulse oximetry and re-evaluation of patient's condition.   For questions or updates, please contact [facility] Please consult [email or web address] for contact info under        Signed, Armanda Magic, MD  08/30/2022, 9:13 AM

## 2022-08-30 NOTE — Procedures (Signed)
Central Venous Catheter Insertion Procedure Note  Gloria Sharp  [id number]  1945-12-13  Date:08/30/22  Time:5:35 AM   Provider Performing: Celine Mans   Procedure: Insertion of Non-tunneled Central Venous [id number]) with US guidance ([id number])   Indication(s) Medication administration  Consent Risks of the procedure as well as the alternatives and risks of each were explained to the patient and/or caregiver.  Consent for the procedure was obtained and is signed in the bedside chart  Anesthesia Topical only with 1% lidocaine   Timeout Verified patient identification, verified procedure, site/side was marked, verified correct patient position, special equipment/implants available, medications/allergies/relevant history reviewed, required imaging and test results available.  Sterile Technique Maximal sterile technique including full sterile barrier drape, hand hygiene, sterile gown, sterile gloves, mask, hair covering, sterile ultrasound probe cover (if used).  Procedure Description Area of catheter insertion was cleaned with chlorhexidine and draped in sterile fashion.  With real-time ultrasound guidance a central venous catheter was placed into the left internal jugular vein. Nonpulsatile blood flow and easy flushing noted in all ports.  The catheter was sutured in place and sterile dressing applied.  Complications/Tolerance None; patient tolerated the procedure well. Chest X-ray is ordered to verify placement for internal jugular or subclavian cannulation.   Chest x-ray is not ordered for femoral cannulation.  EBL Minimal  Specimen(s) None   Some bleeding around insertion site after placement for which a single horizontal mattress suture was placed around insertion site with good result and stop of bleeding.    Rutherford Guys, PA - C  Pulmonary & Critical Care Medicine For pager details, please see AMION or use Epic chat  After 1900, please call [facility] for cross  coverage needs 08/30/2022, 5:36 AM

## 2022-08-30 NOTE — Consult Note (Signed)
Cardiology Consultation   Patient ID: ALEYNNA ROZEK MRN: [id number]; DOB: May 22, 1945  Admit date: 08/30/2022 Date of Consult: 08/30/2022  PCP:  Abner Greenspan, MD   [facility] Providers Cardiologist:  Gypsy Balsam, MD        Patient Profile:   BOBIE MILTENBERGER is a 77 y.o. female with a hx of aortic stenosis, DM, HTN, HL, CVA, NSVT who is being seen 08/30/2022 for the evaluation of abnormal EKG at the request of ED.  History of Present Illness:   History from husband and daughter at bedside, patient is intubated and not responsive. Patient was in her usual state of health until last night when she suddenly woke from bed with severe shortness of breath, severe back pain and became unconscious. Family denies any chest pain, focal neurologic deficits, seizure like activity, fever/chills, recent travel or sick contacts. EMS was called. Initially called as STEMI, this was cancelled given unconscious, no EKG, and recent LHC in May.  In the ED, patient is intubated and on phenyl, amio, heparin gtt. Workup is concerning for ABG 7.1/58/100 (while intubated), lactic acid of 4, Cr. 1.2 (baseline when health ~0.8-0.9), WBC 16, Hgb 10.9 (at baseline), platelets 517. EKG with known LBBB not meeting sgarbossa criteria for STEMI in setting of LBBB, sinus tachycardia in 130s. CT CAP, head ordered.   Recent admission 5/1-05/17/22 for chest pain. LHC showed moderate 2-vessel disease with mid LAD 65% and distal Cx 65%. At that point, opted for medical management given PCI of the LAD would require long stent and jailing large D2. TTE showed normal EF, mild MS, moderate AS (PV = 3, AVA = 1.1, MG = 20, PG = 34, DI = 0.39).On discharge, coreg was increased to 12.5 BID and pravastatin switched to high dose atorvastatin. She was seen in clinic 5/9 and was doing well on new regimen.   Past Medical History:  Diagnosis Date   AKI (acute kidney injury) (HCC) 03/27/2021   Angina pectoris (HCC) 05/15/2022    Aortic stenosis 09/04/2019   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 sciatica 02/12/2022   Class 3 obesity (HCC) 03/26/2021   CVA (cerebral vascular accident) (HCC) 2015?   Cystocele with uterine prolapse 01/17/2018   Diabetes (HCC)    Dizziness 03/27/2021   Dyslipidemia 10/10/2018   Essential hypertension 10/10/2018   Fibromyalgia    GERD (gastroesophageal reflux disease)    Heart murmur 10/10/2018   History of CVA (cerebrovascular accident) 10/10/2018   Hydronephrosis of right kidney 03/26/2021   Hypercholesterolemia    Hypertension    Hypokalemia 03/27/2021   Leukocytosis    Mitral regurgitation 11/24/2018   Nonsustained ventricular tachycardia (HCC) 11/24/2018   Obesity    Post-operative nausea and vomiting    "hard to wake up"   Rectocele 01/17/2018   Sacroiliitis (HCC) 02/12/2022   Sepsis secondary to UTI (HCC) 03/26/2021   Type 2 diabetes mellitus with hyperglycemia (HCC) 03/26/2021   Type 2 diabetes mellitus without complication, without long-term current use of insulin (HCC) 10/10/2018    Past Surgical History:  Procedure Laterality Date   ABDOMINAL HYSTERECTOMY     partial, left the ovaries   APPENDECTOMY     BREAST CYST ASPIRATION Right    COLONOSCOPY  11/11/2013   Colonioc polyps status post polypectomy. Pancolonic diverticulosis predominately in the sigmoid colon   CYSTOSCOPY W/ URETERAL STENT PLACEMENT Right 03/26/2021   Procedure: CYSTOSCOPY WITH RETROGRADE PYELOGRAM/URETERAL STENT PLACEMENT;  Surgeon: Jannifer Hick, MD;  Location: [facility];  Service: Urology;  Laterality: Right;   CYSTOSCOPY/URETEROSCOPY/HOLMIUM LASER/STENT PLACEMENT Right 04/17/2021   Procedure: CYSTOSCOPY/ RETROGRADE/URETEROSCOPY/HOLMIUM LASER/STENT PLACEMENT;  Surgeon: Jannifer Hick, MD;  Location: [facility];  Service: Urology;  Laterality: Right;  ONLY NEEDS 60 MIN   DILATION AND CURETTAGE OF UTERUS     GALLBLADDER SURGERY Right 07/2014   HERNIA REPAIR  02/20/2021   Umbilical   LEFT  HEART CATH AND CORONARY ANGIOGRAPHY N/A 05/16/2022   Procedure: LEFT HEART CATH AND CORONARY ANGIOGRAPHY;  Surgeon: Iran Ouch, MD;  Location: [facility];  Service: Cardiovascular;  Laterality: N/A;   vaginal polyp removal         Inpatient Medications: Scheduled Meds:  amiodarone  150 mg Intravenous Once   Continuous Infusions:  amiodarone     Followed by   amiodarone     heparin 850 Units/hr (08/30/22 0344)   phenylephrine (NEO-SYNEPHRINE) Adult infusion 30 mcg/min (08/30/22 0347)   PRN Meds:   Allergies:    Allergies  Allergen Reactions   Celebrex [Celecoxib] Itching and Swelling    Swelling throughout body Skin redness    Keflex [Cephalexin] Itching   Relafen [Nabumetone] Itching and Swelling   Cipro [Ciprofloxacin Hcl] Hives, Itching and Rash   Flagyl [Metronidazole] Itching, Swelling and Rash    Social History:   Social History   Socioeconomic History   Marital status: Married    Spouse name: Not on file   Number of children: Not on file   Years of education: Not on file   Highest education level: Not on file  Occupational History   Not on file  Tobacco Use   Smoking status: Never   Smokeless tobacco: Never  Vaping Use   Vaping status: Never Used  Substance and Sexual Activity   Alcohol use: Not Currently   Drug use: Never   Sexual activity: Not on file  Other Topics Concern   Not on file  Social History Narrative   Not on file   Social Determinants of Health   Financial Resource Strain: Not on file  Food Insecurity: No Food Insecurity (05/15/2022)   Hunger Vital Sign    Worried About Running Out of Food in the Last Year: Never true    Ran Out of Food in the Last Year: Never true  Transportation Needs: No Transportation Needs (05/15/2022)   PRAPARE - Administrator, Civil Service (Medical): No    Lack of Transportation (Non-Medical): No  Physical Activity: Not on file  Stress: Not on file  Social Connections: Not on  file  Intimate Partner Violence: Not At Risk (05/15/2022)   Humiliation, Afraid, Rape, and Kick questionnaire    Fear of Current or Ex-Partner: No    Emotionally Abused: No    Physically Abused: No    Sexually Abused: No    Family History:    Family History  Problem Relation Age of Onset   Cancer Mother    Hypertension Mother    Stroke Mother    Diabetes Mother    Heart failure Mother    Hypertension Father    Heart disease Father    Colon cancer Neg Hx    Esophageal cancer Neg Hx    Stomach cancer Neg Hx    Rectal cancer Neg Hx    Colon polyps Neg Hx      ROS:  Please see the history of present illness.   All other ROS reviewed and negative.     Physical Exam/Data:  Vitals:   08/30/22 0330 08/30/22 0335 08/30/22 0339 08/30/22 0340  BP: 105/60 115/65  125/73  Pulse: (!) 133 (!) 132  (!) 125  Resp: (!) 25 (!) 25  (!) 25  Temp:      TempSrc:      SpO2: (!) 77% (!) 80%  (!) 89%  Weight:   96.6 kg   Height:   5' (1.524 m)    No intake or output data in the 24 hours ending 08/30/22 0353    08/30/2022    3:39 AM 05/24/2022    8:45 AM 05/15/2022    5:56 PM  Last 3 Weights  Weight (lbs) 212 lb 15.4 oz 210 lb 212 lb 4.9 oz  Weight (kg) 96.6 kg 95.255 kg 96.3 kg     Body mass index is 41.59 kg/m.  General:  Intubated and non responsive on phenyl, amio, heparin.  Vascular: No carotid bruits; Distal pulses 2+ bilaterally Cardiac:  tachycardic, normal S1, S2; RRR; no murmur  Lungs:  bilateral crackles  Abd: soft, nontender, no hepatomegaly  Ext: no edema Musculoskeletal:  unable to assess Skin: warm and dry  Neuro:  unable to assess  EKG:  The EKG was personally reviewed and demonstrates:  LBBB not meeting sgarbossa criteria, sinus tachycardia to 130s, no ectopy Telemetry:  Telemetry was personally reviewed and demonstrates:  as above  Relevant CV Studies:  LHC 05/16/22:   Dist Cx lesion is 65% stenosed.   Ost Cx to Prox Cx lesion is 30% stenosed.   Mid LAD  lesion is 65% stenosed.   2nd Diag lesion is 40% stenosed.   Prox RCA-2 lesion is 20% stenosed.   Prox RCA-1 lesion is 30% stenosed.   The left ventricular systolic function is normal.   LV end diastolic pressure is mildly elevated.   The left ventricular ejection fraction is 55-65% by visual estimate.   1.  Borderline significant two-vessel coronary artery disease with 60 to 70% stenosis in the mid LAD and distal left circumflex. 2.  Normal LV systolic function mildly elevated left ventricular end-diastolic pressure. 3.  Mild aortic stenosis with peak to peak systolic gradient of 10 to 15 mmHg.   Recommendations: Recommend optimizing medical therapy and blood pressure control.  If she has refractory angina in spite of optimal medical therapy, PCI of the LAD can be considered but that will require a long stent and jailing a large second diagonal. I increased the dose of carvedilol to 12.5 mg twice daily and switch pravastatin to high-dose atorvastatin.  TTE 05/17/22: 1. Left ventricular ejection fraction, by estimation, is 60 to 65%. The  left ventricle has normal function. The left ventricle has no regional  wall motion abnormalities. There is mild left ventricular hypertrophy.  Left ventricular diastolic parameters  are consistent with Grade II diastolic dysfunction (pseudonormalization).  Elevated left ventricular end-diastolic pressure. The E/e' is 25.   2. Right ventricular systolic function is normal. The right ventricular  size is normal. Tricuspid regurgitation signal is inadequate for assessing  PA pressure.   3. Left atrial size was mildly dilated.   4. The mitral valve is abnormal. Mild mitral valve regurgitation. Mild  mitral stenosis. The mean mitral valve gradient is 5.0 mmHg with average  heart rate of 69 bpm. Moderate mitral annular calcification.   5. The aortic valve is tricuspid. There is moderate calcification of the  aortic valve. Aortic valve regurgitation is  trivial. Moderate aortic valve  stenosis. Aortic valve area, by VTI measures  1.10 cm. Aortic valve mean  gradient measures 20.5 mmHg.  Aortic valve Vmax measures 2.94 m/s. Peak gradien tis 34.7 mmHG, DI is  0.39.   6. The inferior vena cava is dilated in size with <50% respiratory  variability, suggesting right atrial pressure of 15 mmHg.   Comparison(s): Changes from prior study are noted. 12/01/2021: LVEF  60-65%, mild to moderate MR, mild MS - mean gradient 3 mmHg, mild to  moderate AS - mean gradient 13.5 mmHg.   Laboratory Data:  High Sensitivity Troponin:  No results for input(s): "TROPONINIHS" in the last 720 hours.   Chemistry Recent Labs  Lab 08/30/22 0320 08/30/22 0322  NA 138 139  K 4.9 4.7  CL  --  105  GLUCOSE  --  300*  BUN  --  27*  CREATININE  --  1.20*    No results for input(s): "PROT", "ALBUMIN", "AST", "ALT", "ALKPHOS", "BILITOT" in the last 168 hours. Lipids No results for input(s): "CHOL", "TRIG", "HDL", "LABVLDL", "LDLCALC", "CHOLHDL" in the last 168 hours.  Hematology Recent Labs  Lab 08/30/22 0320 08/30/22 0322  WBC  --  16.2*  RBC  --  4.07  HGB 11.2* 10.9*  12.6  HCT 33.0* 36.5  37.0  MCV  --  89.7  MCH  --  26.8  MCHC  --  29.9*  RDW  --  14.7  PLT  --  517*   Thyroid No results for input(s): "TSH", "FREET4" in the last 168 hours.  BNPNo results for input(s): "BNP", "PROBNP" in the last 168 hours.  DDimer No results for input(s): "DDIMER" in the last 168 hours.   Radiology/Studies:  DG Chest Portable 1 View  Result Date: 08/30/2022 CLINICAL DATA:  Check endotracheal tube placement EXAM: PORTABLE CHEST 1 VIEW COMPARISON:  05/15/2022 FINDINGS: Endotracheal tube is noted in satisfactory position. Cardiac shadow is stable. Aortic calcifications are again seen. Mild vascular congestion is noted. Diffuse increased density is noted throughout the right lung which may represent asymmetric edema or focal infiltrate. IMPRESSION: Endotracheal  tube in satisfactory position. Vascular congestion is noted. Increased density is noted in the right lung as described. Electronically Signed   By: Alcide Clever M.D.   On: 08/30/2022 03:46     Assessment and Plan:   BROCK BARTEN is a 77 y.o. female with a hx of aortic stenosis, DM, HTN, HL, CVA, NSVT who is being seen 08/30/2022 for the evaluation of abnormal EKG at the request of ED.  Abnormal EKG Tachycardia with known LBBB not meeting sgarbossa criteria for MI in LBBB. Additionally, she remains unresponsive, intubated, with several diagnostic abnormalities that could point to alternate etiologies. Recent LHC in May with moderate two vessel disease that is being medically managed. Will not re-activate STEMI. Differential for her significant event includes PE, dissection, sepsis - workup for these is pending.  - she has ASA allergy in her chart (mentioned in prior discharge, confirmed with family) - has been on plavix since her prior discharge, no missed doses, will not reload at this point - continue heparin gtt as this empirically treats for PE which remains high on the differential - TTE ordered - last during previous admission showed normal EF with moderate AS   Risk Assessment/Risk Scores:                For questions or updates, please contact [facility] Please consult [email or web address] for contact info under    Signed, Lynwood Dawley, MD  08/30/2022  3:53 AM

## 2022-08-30 NOTE — Progress Notes (Signed)
NAME:  Gloria Sharp, MRN:  [id number], DOB:  November 09, 1945, LOS: 0 ADMISSION DATE:  08/30/2022, CONSULTATION DATE:  08/30/22 REFERRING MD:  Nicanor Alcon- ED, CHIEF COMPLAINT:  respiratory failure   History of Present Illness:  History obtained from husband and daughter in the ED.  Gloria Sharp is a 77 y/o woman with a history of DM, HTN, FM, IBS who presented after having sudden onset SOB tonight. She had just gotten back in bed from going to the bathroom when she told her husband that she couldn't breathe and wanted him to call EMS. When the fire department got there she was unconscious and ineffectively breathing so they started BVM until EMS arrived. She remained unconscious requiring BVM all the way to the hospital. She was intubated in the ED. She has no history of blood clots or MI. She has known murmurs and 2-vessel CAD since LHC in 05/2022. She is on Plavix chronically but no other blood thinners. She has not had fever, chills, cough, or new GI symptoms different from her chronic diarrhea from her IBS. Family reports nothing like this has ever happened before. She is a non-smoker and does not drink or use drugs. She has not had recent medication changes. She went to pain clinic 3 days ago for back pain but did not get an injection or new meds.   Pertinent Medical History:  Fibromyalgia IBS HTN Mild MR, MS Mild AS by gradient DM PUD Asthma Chronic back pain, not on opiates  Significant Hospital Events: Including procedures, antibiotic start and stop dates in addition to other pertinent events   8/15 Admitted. Intubated in ED. CT Head NAICA. CTA Chest/A/P negative for dissection, +interlobular septal thickening with mosaic GGOs diffusely, dense consolidative airspace disease in BLL with small bilateral pleural effusions, c/f multifocal PNA, hepatic steatosis, L colonic diverticulosis. LIJ CVC placed.  Interim History / Subjective:  Remains intubated, sedated Following commands with lightened  sedation Plan for LHC at 1200 with Dr. Lynnette Caffey Co-ox pending F/u TG, if high can transition Prop to Precedex  Objective:  Blood pressure (!) 101/54, pulse 82, temperature 98.6 F (37 C), resp. rate (!) 28, height 5' (1.524 m), weight 96.6 kg, SpO2 100%.    Vent Mode: PRVC FiO2 (%):  [90 %-100 %] 90 % Set Rate:  [25 bmp-28 bmp] 28 bmp Vt Set:  [400 mL] 400 mL PEEP:  [12 cmH20-15 cmH20] 15 cmH20 Plateau Pressure:  [23 cmH20-26 cmH20] 26 cmH20   Intake/Output Summary (Last 24 hours) at 08/30/2022 0923 Last data filed at 08/30/2022 0800 Gross per 24 hour  Intake 1552.4 ml  Output 325 ml  Net 1227.4 ml   Filed Weights   08/30/22 0339  Weight: 96.6 kg   Physical Examination: General: Acutely ill-appearing elderly woman in NAD. HEENT: Gloria Sharp/AT, anicteric sclera, PERRL, moist mucous membranes. Neuro:  Awake, unable to assess orientation.  Responds to verbal stimuli. Following commands consistently. Moves all 4 extremities spontaneously. +Cough and +Gag  CV: RRR, no m/g/r. PULM: Breathing even and unlabored on vent (PEEP 10, FiO2 90%). Lung fields coarse throughout. GI: Soft, nontender, nondistended. Normoactive bowel sounds. Extremities: Trace bilateral symmetric LE edema noted. Skin: Warm/dry, no rashes  Resolved Hospital Problem List:     Assessment & Plan:  Acute respiratory failure with hypoxia & hypercapnia Acute pulmonary edema-- history suggests flash pulmonary edema. Unsure if this was hypertensive emergency, ACS, or tachyarrhythmia - Continue full vent support (4-8cc/kg IBW) - Wean FiO2 for O2 sat > 90% - Daily  WUA/SBT when appropriate from a vent mechanics standpoint - VAP bundle - Pulmonary hygiene - PAD protocol for sedation: Propofol and Fentanyl for goal RASS 0 to -1, f/u TG and determine if transition to Precedex needed - Diuresis as tolerated - Empiric antibiotics as below  Shock- septic versus cardiogenic Lactic acidosis - Goal MAP > 65 - Neo titrated to  goal MAP - F/u Co-ox - Holding further IV fluid resuscitation in the setting of pulmonary edema/volume overload - Trend WBC, fever curve, LA - F/u Cx data - Continue broad-spectrum antibiotics (aztreonam, may be able to transition to cefepime given history of tolerance; discontinue vancomycin; MRSA PCR negative) - Hold home antihypertensives  Troponin elevation Sinus tachycardia Echo 05/2022 EF 60-65%, no RWMAs, mild LVH, G2DD. LHC 05/2022 with known borderline LAD lesion with a long 65-70% mid LAD lesion and mid Left Cx. - Cardiology consulted - Plan for LHC today, 8/15 Lynnette Caffey) - F/u Echo - Heparin gtt per protocol for ACS - Continue statin - Favor Plavix > ASA, given ?allergy/intolerance - Hold amiodarone for now - Cardiac monitoring  UTI No previous urine cultures available - F/u UA/Cx - Empiric aztreonam for now, consider transition to cefepime (has tolerated previously), d/c vanc  Acute encephalopathy Possibly due to hypoxia; head CT unrevealing.  - EEG  - Correct metabolic derangements, hypoxia - Hold PTA tizanidine/gabapentin, resume as able once mental status improves  Uncontrolled hyperglycemia, DM - SSI - CBGs Q4H - Goal CBG 140-180 - Hold home metformin, pioglitazone  Chronic anemia History of PUD/?GIB - Trend H&H - Monitor for signs of active bleeding; has a history of PUD - Transfuse for Hgb < 7.0 or hemodynamically significant bleeding  Mild transaminase elevation - Trend LFTs to normal - Supportive care  AKI on CKD stage 3a - Trend BMP - Replete electrolytes as indicated - Monitor I&Os - Avoid nephrotoxic agents as able - Ensure adequate renal perfusion  Chronic back pain - Hold home tizanidine, resume if signs of withdrawal once extubated  Best Practice (right click and "Reselect all SmartList Selections" daily)   Diet/type: NPO DVT prophylaxis: systemic heparin GI prophylaxis: PPI Lines: Central line Foley:  Yes, and it is still  needed Code Status:  full code Last date of multidisciplinary goals of care discussion [8/15 - Husband and daughter updated at bedside. They confirmed full code.]  Critical care time:   The patient is critically ill with multiple organ system failure and requires high complexity decision making for assessment and support, frequent evaluation and titration of therapies, advanced monitoring, review of radiographic studies and interpretation of complex data.   Critical Care Time devoted to patient care services, exclusive of separately billable procedures, described in this note is 38 minutes.  Tim Lair, PA-C [facility] 08/30/22 9:23 AM  Please see [email or web address] for pager details.  From 7A-7P if no response, please call [phone] After hours, please call ELink [phone]

## 2022-08-30 NOTE — ED Provider Notes (Signed)
[facility] EMERGENCY DEPARTMENT AT [facility] Provider Note   CSN: [id number] Arrival date & time: 08/30/22  4098     History  Chief Complaint  Patient presents with   Loss of Consciousness    Gloria Sharp is a 77 y.o. female.  The history is provided by the EMS personnel and the spouse. The history is limited by the condition of the patient.  Loss of Consciousness Episode history:  Single Most recent episode:  Today Timing:  Constant Progression:  Unchanged Chronicity:  New Context: not blood draw   Relieved by:  Nothing Worsened by:  Nothing Ineffective treatments:  None tried Associated symptoms: diaphoresis, shortness of breath and vomiting        Home Medications Prior to Admission medications   Medication Sig Start Date End Date Taking? Authorizing Provider  acetaminophen (TYLENOL) 650 MG CR tablet Take 650 mg by mouth daily as needed for pain.    [provider]  alendronate (FOSAMAX) 35 MG tablet Take 35 mg by mouth every Monday.    [provider]  amLODipine (NORVASC) 10 MG tablet Take 1 tablet (10 mg total) by mouth daily. 05/18/22   Perlie Gold, PA-C  atorvastatin (LIPITOR) 80 MG tablet Take 1 tablet (80 mg total) by mouth daily. 05/18/22   Perlie Gold, PA-C  brimonidine (ALPHAGAN) 0.15 % ophthalmic solution Place 1 drop into both eyes 2 (two) times daily. 08/10/19   [provider]  bupivacaine (MARCAINE) 0.5 % injection 0.5 mLs by Other route once. Pain lumbar medial branch block    [provider]  carvedilol (COREG) 12.5 MG tablet Take 1 tablet (12.5 mg total) by mouth 2 (two) times daily with a meal. 05/17/22   Perlie Gold, PA-C  cholestyramine (QUESTRAN) 4 g packet Take 1 packet (4 g total) by mouth daily. Take at least 2 hours before or after rest of the medications Patient taking differently: Take 4 g by mouth every 3 (three) days. 03/25/19   Lynann Bologna, MD  clopidogrel (PLAVIX) 75 MG tablet Take 1  tablet (75 mg total) by mouth daily. 05/18/22   Perlie Gold, PA-C  Cyanocobalamin (VITAMIN B-12 PO) Take 1 tablet by mouth daily in the afternoon.    [provider]  diclofenac Sodium (VOLTAREN) 1 % GEL Apply 2-4 g topically at bedtime as needed (knee pain).    [provider]  fenofibrate (TRICOR) 145 MG tablet Take 145 mg by mouth daily.    [provider]  ferrous sulfate 325 (65 FE) MG EC tablet Take 325 mg by mouth daily in the afternoon.    [provider]  furosemide (LASIX) 20 MG tablet Take 1 tablet (20 mg total) by mouth daily. 05/17/22   Perlie Gold, PA-C  gabapentin (NEURONTIN) 300 MG capsule Take 300 mg by mouth 3 (three) times daily.    [provider]  [employer] [facility]) OINT Apply 1 application  topically 2 (two) times daily as needed (itching).    [provider]  irbesartan (AVAPRO) 150 MG tablet Take 1 tablet (150 mg total) by mouth daily. 05/18/22   Perlie Gold, PA-C  MAGNESIUM PO Take 1 tablet by mouth daily in the afternoon.    [provider]  metFORMIN (GLUCOPHAGE) 1000 MG tablet Take 1,000 mg by mouth 2 (two) times daily with a meal.    [provider]  Omega-3 Fatty Acids (FISH OIL PO) Take 1 capsule by mouth daily in the afternoon.  [provider]  OVER THE COUNTER MEDICATION Apply 1-4 patches topically daily as needed (back pain). Coralite pain patch    [provider]  pantoprazole (PROTONIX) 40 MG tablet Take 40 mg by mouth daily.    [provider]  pioglitazone (ACTOS) 15 MG tablet Take 15 mg by mouth daily. 01/25/20   [provider]  potassium chloride SA (KLOR-CON M) 20 MEQ tablet Take 2 tablets (40 mEq total) by mouth daily. 05/17/22   Perlie Gold, PA-C  tiZANidine (ZANAFLEX) 2 MG tablet Take 2 mg by mouth every 6 (six) hours as needed for muscle spasms.    [provider]  TURMERIC PO Take 1 tablet by mouth daily in the  afternoon.    [provider]      Allergies    Celebrex [celecoxib], Keflex [cephalexin], Relafen [nabumetone], Cipro [ciprofloxacin hcl], and Flagyl [metronidazole]    Review of Systems   Review of Systems  Unable to perform ROS: Acuity of condition  Constitutional:  Positive for diaphoresis.  Respiratory:  Positive for shortness of breath.   Cardiovascular:  Positive for syncope.  Gastrointestinal:  Positive for vomiting.    Physical Exam Updated Vital Signs BP (!) 149/73   Pulse (!) 124   Temp 99.4 F (37.4 C) (Rectal)   Resp (!) 22   Ht 5' (1.524 m)   Wt 96.6 kg   SpO2 (!) 86%   BMI 41.59 kg/m  Physical Exam Vitals and nursing note reviewed. Exam conducted with a chaperone present.  Constitutional:      General: She is in acute distress.     Appearance: She is well-developed. She is diaphoretic.  HENT:     Head: Normocephalic and atraumatic.     Nose: Nose normal.     Mouth/Throat:     Pharynx: No oropharyngeal exudate.  Eyes:     Pupils: Pupils are equal, round, and reactive to light.  Cardiovascular:     Rate and Rhythm: Regular rhythm. Tachycardia present.     Pulses: Normal pulses.     Heart sounds: Normal heart sounds.  Pulmonary:     Effort: Respiratory distress present.     Breath sounds: Rales present.  Abdominal:     General: Bowel sounds are normal. There is no distension.     Palpations: Abdomen is soft.     Tenderness: There is no abdominal tenderness. There is no guarding or rebound.  Genitourinary:    Vagina: No vaginal discharge.  Musculoskeletal:        General: No deformity.     Cervical back: Normal range of motion and neck supple.  Skin:    General: Skin is warm.     Capillary Refill: Capillary refill takes less than 2 seconds.     Findings: No erythema or rash.  Neurological:     Deep Tendon Reflexes: Reflexes normal.  Psychiatric:        Mood and Affect: Mood normal.     ED Results / Procedures / Treatments    Labs (all labs ordered are listed, but only abnormal results are displayed) Results for orders placed or performed during the hospital encounter of 08/30/22  CBC with Differential  Result Value Ref Range   WBC 16.2 (H) 4.0 - 10.5 K/uL   RBC 4.07 3.87 - 5.11 MIL/uL   Hemoglobin 10.9 (L) 12.0 - 15.0 g/dL   HCT 16.1 09.6 - 04.5 %   MCV 89.7 80.0 - 100.0 fL   MCH 26.8 26.0 -  34.0 pg   MCHC 29.9 (L) 30.0 - 36.0 g/dL   RDW 82.9 56.2 - 13.0 %   Platelets 517 (H) 150 - 400 K/uL   nRBC 0.0 0.0 - 0.2 %   Neutrophils Relative % 72 %   Neutro Abs 11.7 (H) 1.7 - 7.7 K/uL   Lymphocytes Relative 19 %   Lymphs Abs 3.0 0.7 - 4.0 K/uL   Monocytes Relative 7 %   Monocytes Absolute 1.2 (H) 0.1 - 1.0 K/uL   Eosinophils Relative 1 %   Eosinophils Absolute 0.2 0.0 - 0.5 K/uL   Basophils Relative 0 %   Basophils Absolute 0.1 0.0 - 0.1 K/uL   Immature Granulocytes 1 %   Abs Immature Granulocytes 0.11 (H) 0.00 - 0.07 K/uL  Comprehensive metabolic panel  Result Value Ref Range   Sodium 135 135 - 145 mmol/L   Potassium 4.7 3.5 - 5.1 mmol/L   Chloride 106 98 - 111 mmol/L   CO2 15 (L) 22 - 32 mmol/L   Glucose, Bld 295 (H) 70 - 99 mg/dL   BUN 25 (H) 8 - 23 mg/dL   Creatinine, Ser 8.65 (H) 0.44 - 1.00 mg/dL   Calcium 8.6 (L) 8.9 - 10.3 mg/dL   Total Protein 6.3 (L) 6.5 - 8.1 g/dL   Albumin 3.3 (L) 3.5 - 5.0 g/dL   AST 42 (H) 15 - 41 U/L   ALT 28 0 - 44 U/L   Alkaline Phosphatase 75 38 - 126 U/L   Total Bilirubin 0.7 0.3 - 1.2 mg/dL   GFR, Estimated 54 (L) >60 mL/min   Anion gap 14 5 - 15  Brain natriuretic peptide  Result Value Ref Range   B Natriuretic Peptide 458.3 (H) 0.0 - 100.0 pg/mL  Protime-INR  Result Value Ref Range   Prothrombin Time 16.7 (H) 11.4 - 15.2 seconds   INR 1.3 (H) 0.8 - 1.2  Urinalysis, Routine w reflex microscopic -Urine, Catheterized  Result Value Ref Range   Color, Urine AMBER (A) YELLOW   APPearance CLOUDY (A) CLEAR   Specific Gravity, Urine 1.019 1.005 - 1.030    pH 5.0 5.0 - 8.0   Glucose, UA 50 (A) NEGATIVE mg/dL   Hgb urine dipstick NEGATIVE NEGATIVE   Bilirubin Urine NEGATIVE NEGATIVE   Ketones, ur NEGATIVE NEGATIVE mg/dL   Protein, ur >=784 (A) NEGATIVE mg/dL   Nitrite NEGATIVE NEGATIVE   Leukocytes,Ua TRACE (A) NEGATIVE   RBC / HPF 6-10 0 - 5 RBC/hpf   WBC, UA >50 0 - 5 WBC/hpf   Bacteria, UA FEW (A) NONE SEEN   Squamous Epithelial / HPF 0-5 0 - 5 /HPF   Mucus PRESENT    Hyaline Casts, UA PRESENT   APTT  Result Value Ref Range   aPTT 31 24 - 36 seconds  CBG monitoring, ED  Result Value Ref Range   Glucose-Capillary 304 (H) 70 - 99 mg/dL  I-Stat arterial blood gas, ED  Result Value Ref Range   pH, Arterial 7.104 (LL) 7.35 - 7.45   pCO2 arterial 57.6 (H) 32 - 48 mmHg   pO2, Arterial 103 83 - 108 mmHg   Bicarbonate 18.0 (L) 20.0 - 28.0 mmol/L   TCO2 20 (L) 22 - 32 mmol/L   O2 Saturation 95 %   Acid-base deficit 12.0 (H) 0.0 - 2.0 mmol/L   Sodium 138 135 - 145 mmol/L   Potassium 4.9 3.5 - 5.1 mmol/L   Calcium, Ion 1.25 1.15 - 1.40 mmol/L   HCT  33.0 (L) 36.0 - 46.0 %   Hemoglobin 11.2 (L) 12.0 - 15.0 g/dL   Collection site RADIAL, ALLEN'S TEST ACCEPTABLE    Drawn by RT    Sample type ARTERIAL    Comment NOTIFIED PHYSICIAN   I-Stat arterial blood gas, ED ([facility], [facility], [facility])  Result Value Ref Range   pH, Arterial 7.129 (LL) 7.35 - 7.45   pCO2 arterial 61.8 (H) 32 - 48 mmHg   pO2, Arterial 85 83 - 108 mmHg   Bicarbonate 20.5 20.0 - 28.0 mmol/L   TCO2 22 22 - 32 mmol/L   O2 Saturation 92 %   Acid-base deficit 9.0 (H) 0.0 - 2.0 mmol/L   Sodium 136 135 - 145 mmol/L   Potassium 4.3 3.5 - 5.1 mmol/L   Calcium, Ion 1.25 1.15 - 1.40 mmol/L   HCT 31.0 (L) 36.0 - 46.0 %   Hemoglobin 10.5 (L) 12.0 - 15.0 g/dL   Collection site RADIAL, ALLEN'S TEST ACCEPTABLE    Drawn by RT    Sample type ARTERIAL    Comment NOTIFIED PHYSICIAN   I-Stat CG4 Lactic Acid, ED  Result Value Ref Range   Lactic Acid, Venous 4.0 (HH) 0.5 - 1.9 mmol/L    Comment NOTIFIED PHYSICIAN   I-stat chem 8, ed  Result Value Ref Range   Sodium 139 135 - 145 mmol/L   Potassium 4.7 3.5 - 5.1 mmol/L   Chloride 105 98 - 111 mmol/L   BUN 27 (H) 8 - 23 mg/dL   Creatinine, Ser 1.61 (H) 0.44 - 1.00 mg/dL   Glucose, Bld 096 (H) 70 - 99 mg/dL   Calcium, Ion 0.45 4.09 - 1.40 mmol/L   TCO2 20 (L) 22 - 32 mmol/L   Hemoglobin 12.6 12.0 - 15.0 g/dL   HCT 81.1 91.4 - 78.2 %  Troponin I (High Sensitivity)  Result Value Ref Range   Troponin I (High Sensitivity) 584 (HH) <18 ng/L   CT Head Wo Contrast  Result Date: 08/30/2022 CLINICAL DATA:  Blunt poly trauma with unresponsiveness. EXAM: CT HEAD WITHOUT CONTRAST TECHNIQUE: Contiguous axial images were obtained from the base of the skull through the vertex without intravenous contrast. RADIATION DOSE REDUCTION: This exam was performed according to the departmental dose-optimization program which includes automated exposure control, adjustment of the mA and/or kV according to patient size and/or use of iterative reconstruction technique. COMPARISON:  06/05/2021 FINDINGS: Brain: No evidence of acute infarction, hemorrhage, hydrocephalus, extra-axial collection or mass lesion/mass effect. Patchy low-density in the cerebral white matter attributed to chronic small vessel ischemia, moderately extensive. Vascular: No hyperdense vessel or unexpected calcification. Skull: Normal. Negative for fracture or focal lesion. Sinuses/Orbits: No acute finding.  Bilateral cataract resection. IMPRESSION: 1. No acute finding. 2. 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 Electronically Signed   By: Tiburcio Pea M.D.   On: 08/30/2022 04:56   DG Chest Portable 1 View  Result Date: 08/30/2022 CLINICAL DATA:  Check endotracheal tube placement EXAM: PORTABLE CHEST 1 VIEW COMPARISON:  05/15/2022 FINDINGS: Endotracheal tube is noted in satisfactory position. Cardiac shadow is stable. Aortic calcifications are again seen. Mild  vascular congestion is noted. Diffuse increased density is noted throughout the right lung which may represent asymmetric edema or focal infiltrate. IMPRESSION: Endotracheal tube in satisfactory position. Vascular congestion is noted. Increased density is noted in the right lung as described. Electronically Signed   By: Alcide Clever M.D.   On: 08/30/2022 03:46     EKG EKG Interpretation  Date/Time:  Thursday August 30 2022 03:13:22 EDT Ventricular Rate:  149 PR Interval:  59 QRS Duration:  133 QT Interval:  328 QTC Calculation: 517 R Axis:   72  Text Interpretation: tachycardia Nonspecific intraventricular conduction delay Baseline wander in lead(s) V1 ST elevation anteriorly Confirmed by Nicanor Alcon,  ([id number]) on 08/30/2022 4:27:45 AM  Radiology CT Head Wo Contrast  Result Date: 08/30/2022 CLINICAL DATA:  Blunt poly trauma with unresponsiveness. EXAM: CT HEAD WITHOUT CONTRAST TECHNIQUE: Contiguous axial images were obtained from the base of the skull through the vertex without intravenous contrast. RADIATION DOSE REDUCTION: This exam was performed according to the departmental dose-optimization program which includes automated exposure control, adjustment of the mA and/or kV according to patient size and/or use of iterative reconstruction technique. COMPARISON:  06/05/2021 FINDINGS: Brain: No evidence of acute infarction, hemorrhage, hydrocephalus, extra-axial collection or mass lesion/mass effect. Patchy low-density in the cerebral white matter attributed to chronic small vessel ischemia, moderately extensive. Vascular: No hyperdense vessel or unexpected calcification. Skull: Normal. Negative for fracture or focal lesion. Sinuses/Orbits: No acute finding.  Bilateral cataract resection. IMPRESSION: 1. No acute finding. 2. 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 Electronically Signed   By: Tiburcio Pea M.D.   On: 08/30/2022 04:56   DG Chest Portable 1 View  Result Date:  08/30/2022 CLINICAL DATA:  Check endotracheal tube placement EXAM: PORTABLE CHEST 1 VIEW COMPARISON:  05/15/2022 FINDINGS: Endotracheal tube is noted in satisfactory position. Cardiac shadow is stable. Aortic calcifications are again seen. Mild vascular congestion is noted. Diffuse increased density is noted throughout the right lung which may represent asymmetric edema or focal infiltrate. IMPRESSION: Endotracheal tube in satisfactory position. Vascular congestion is noted. Increased density is noted in the right lung as described. Electronically Signed   By: Alcide Clever M.D.   On: 08/30/2022 03:46    Procedures .Critical Care E&M  Performed by: Cy Blamer, MD Critical care provider statement:    Critical care end time:  08/30/2022 5:27 AM   Critical care time was exclusive of:  Separately billable procedures and treating other patients   Critical care was necessary to treat or prevent imminent or life-threatening deterioration of the following conditions:  Cardiac failure, circulatory failure, CNS failure or compromise and respiratory failure   Critical care was time spent personally by me on the following activities:  Discussions with consultants, examination of patient, ordering and performing treatments and interventions, ordering and review of radiographic studies, ordering and review of laboratory studies, pulse oximetry and re-evaluation of patient's condition   Care discussed with: admitting provider   After initial E/M assessment, critical care services were subsequently performed that were exclusive of separately billable procedures or treatment.   Procedure Name: Intubation Date/Time: 08/30/2022 5:07 AM  Performed by: Cy Blamer, MDPre-anesthesia Checklist: Patient identified, Patient being monitored, Emergency Drugs available, Timeout performed and Suction available Oxygen Delivery Method: Non-rebreather mask Preoxygenation: Pre-oxygenation with 100% oxygen Induction Type:  Rapid sequence Ventilation: Mask ventilation without difficulty Laryngoscope Size: Glidescope and 4 Grade View: Grade III Tube size: 7.5 mm Number of attempts: 1 Airway Equipment and Method: Patient positioned with wedge pillow Placement Confirmation: ETT inserted through vocal cords under direct vision, CO2 detector and Breath sounds checked- equal and bilateral Dental Injury: Teeth and Oropharynx as per pre-operative assessment  Difficulty Due To: Difficulty was anticipated        Medications Ordered in ED Medications  amiodarone (NEXTERONE) 1.8 mg/mL load via infusion 150 mg (0 mg Intravenous  Hold 08/30/22 0456)    Followed by  amiodarone (NEXTERONE PREMIX) 360-4.14 MG/200ML-% (1.8 mg/mL) IV infusion (0 mg/hr Intravenous Hold 08/30/22 0458)    Followed by  amiodarone (NEXTERONE PREMIX) 360-4.14 MG/200ML-% (1.8 mg/mL) IV infusion (has no administration in time range)  phenylephrine (NEO-SYNEPHRINE) 20mg /NS premix infusion (30 mcg/min Intravenous Rate/Dose Change 08/30/22 0347)  heparin ADULT infusion 100 units/mL (25000 units/224mL) (850 Units/hr Intravenous New Bag/Given 08/30/22 0344)  lactated ringers infusion (has no administration in time range)  aztreonam (AZACTAM) 2 g in sodium chloride 0.9 % 100 mL IVPB (2 g Intravenous New Bag/Given 08/30/22 0456)  lactated ringers bolus 1,000 mL (1,000 mLs Intravenous New Bag/Given 08/30/22 0458)  vancomycin (VANCOREADY) IVPB 1500 mg/300 mL (has no administration in time range)  heparin bolus via infusion 4,000 Units (4,000 Units Intravenous Bolus from Bag 08/30/22 0326)  etomidate (AMIDATE) injection (20 mg Intravenous Given 08/30/22 0312)  rocuronium (ZEMURON) injection (100 mg Intravenous Given 08/30/22 0312)  iohexol (OMNIPAQUE) 350 MG/ML injection 75 mL (75 mLs Intravenous Contrast Given 08/30/22 0445)    ED Course/ Medical Decision Making/ A&P                                 Medical Decision Making Patient called STEMi in the  field with unresponsiveness   Amount and/or Complexity of Data Reviewed Independent Historian: EMS    Details: See above  External Data Reviewed: notes.    Details: Previous notes  Labs: ordered.    Details: Markedly elevated troponin 584, elevated white count 16.2, low hemoglobin 10.9, normal platelet. Urine not consistent with UTI. Elevated BNP 458.  Normal sodium 135, normal potassium 4.7, creatinine 1.07, acidosis on ABG, elevated CO2  Radiology: ordered.    Details: Pulmonary edema by me on CXR ECG/medicine tests: ordered and independent interpretation performed. Decision-making details documented in ED Course. Discussion of management or test interpretation with external provider(s): Dr. Maximino Sarin who has seen patient from cardiology following cancellation of stemi by cardiology PCCm for admission   Risk Prescription drug management. Decision regarding hospitalization.  Critical Care Total time providing critical care: 120 minutes (Sepsis bundle, consults, dips and ongoing care )    Final Clinical Impression(s) / ED Diagnoses Final diagnoses:  Respiratory arrest (HCC)  ST elevation myocardial infarction (STEMI), unspecified artery (HCC)  Hypotension, unspecified hypotension type  Syncope and collapse   The patient appears reasonably stabilized for admission considering the current resources, flow, and capabilities available in the ED at this time, and I doubt any other [facility] requiring further screening and/or treatment in the ED prior to admission.  Rx / DC Orders ED Discharge Orders     None         , , MD 08/30/22 2130

## 2022-08-30 NOTE — Progress Notes (Signed)
Pharmacy Antibiotic Note  Gloria Sharp is a 77 y.o. female admitted on 08/30/2022 with sepsis.  Pharmacy has been consulted for aztreonam dosing. WBC 16.2, afebrile, sCr 1.2 (bl~0.9), lactate 4. Received one dose of vanc and aztreonam in ED  Plan: Aztreonam 2g IV every 8 hours Monitor renal function Follow up LOT, WBC, blood cultures, and signs of clinical improvement   Height: 5' (152.4 cm) Weight: 96.6 kg (212 lb 15.4 oz) IBW/kg (Calculated) : 45.5  Temp (24hrs), Avg:98.2 F (36.8 C), Min:97.4 F (36.3 C), Max:99.4 F (37.4 C)  Recent Labs  Lab 08/30/22 0322 08/30/22 0324  WBC 16.2*  --   CREATININE 1.07*  1.20*  --   LATICACIDVEN  --  4.0*    Estimated Creatinine Clearance: 47.3 mL/min (A) (by C-G formula based on SCr of 1.07 mg/dL (H)).    Allergies  Allergen Reactions   Celebrex [Celecoxib] Itching and Swelling    Swelling throughout body Skin redness    Keflex [Cephalexin] Itching   Relafen [Nabumetone] Itching and Swelling   Cipro [Ciprofloxacin Hcl] Hives, Itching and Rash   Flagyl [Metronidazole] Itching, Swelling and Rash    Antimicrobials this admission: Aztreonam 8/15 >>  Vancomycin 8/15 >>   Microbiology results: 8/15 BCx:   Thank you for allowing pharmacy to be a part of this patient's care.  Arabella Merles, PharmD. Clinical Pharmacist 08/30/2022 5:44 AM

## 2022-08-30 NOTE — Progress Notes (Signed)
ANTICOAGULATION CONSULT NOTE - Initial Consult  Pharmacy Consult for heparin Indication: chest pain/ACS  Allergies  Allergen Reactions   Celebrex [Celecoxib] Itching and Swelling    Swelling throughout body Skin redness    Keflex [Cephalexin] Itching   Relafen [Nabumetone] Itching and Swelling   Cipro [Ciprofloxacin Hcl] Hives, Itching and Rash   Flagyl [Metronidazole] Itching, Swelling and Rash    Patient Measurements: Height: 5' (152.4 cm) Weight: 96.6 kg (212 lb 15.4 oz) IBW/kg (Calculated) : 45.5 Heparin Dosing Weight: 68.8 kg  Vital Signs: Temp: 99.4 F (37.4 C) (08/15 0322) Temp Source: Rectal (08/15 0322) BP: 105/60 (08/15 0330) Pulse Rate: 133 (08/15 0330)  Labs: Recent Labs    08/30/22 0320 08/30/22 0322  HGB 11.2* 10.9*  12.6  HCT 33.0* 36.5  37.0  PLT  --  517*  CREATININE  --  1.20*    Estimated Creatinine Clearance: 42.1 mL/min (A) (by C-G formula based on SCr of 1.2 mg/dL (H)).   Medical History: Past Medical History:  Diagnosis Date   AKI (acute kidney injury) (HCC) 03/27/2021   Angina pectoris (HCC) 05/15/2022   Aortic stenosis 09/04/2019   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 sciatica 02/12/2022   Class 3 obesity (HCC) 03/26/2021   CVA (cerebral vascular accident) (HCC) 2015?   Cystocele with uterine prolapse 01/17/2018   Diabetes (HCC)    Dizziness 03/27/2021   Dyslipidemia 10/10/2018   Essential hypertension 10/10/2018   Fibromyalgia    GERD (gastroesophageal reflux disease)    Heart murmur 10/10/2018   History of CVA (cerebrovascular accident) 10/10/2018   Hydronephrosis of right kidney 03/26/2021   Hypercholesterolemia    Hypertension    Hypokalemia 03/27/2021   Leukocytosis    Mitral regurgitation 11/24/2018   Nonsustained ventricular tachycardia (HCC) 11/24/2018   Obesity    Post-operative nausea and vomiting    "hard to wake up"   Rectocele 01/17/2018   Sacroiliitis (HCC) 02/12/2022   Sepsis secondary to UTI (HCC)  03/26/2021   Type 2 diabetes mellitus with hyperglycemia (HCC) 03/26/2021   Type 2 diabetes mellitus without complication, without long-term current use of insulin (HCC) 10/10/2018   Assessment: [name] presented to the ED with severe back pain and became unresponsive with EMS. Initially code STEMI cancelled given EKG. Pharmacy consulted to dose heparin for chest pain/ACS. No PTA AC, already given a 4000 unit bolus in ED, Hgb 10.9.  Goal of Therapy:  Heparin level 0.3-0.7 units/ml Monitor platelets by anticoagulation protocol: Yes   Plan:  Give 4000 units bolus x 1 Start heparin infusion at 850 units/hr Check anti-Xa level in 8 hours and daily while on heparin Continue to monitor H&H and platelets  Arabella Merles, PharmD. Clinical Pharmacist 08/30/2022 3:43 AM

## 2022-08-30 NOTE — Plan of Care (Signed)

## 2022-08-30 NOTE — H&P (Signed)
NAME:  Gloria Sharp, MRN:  [id number], DOB:  08-19-1945, LOS: 0 ADMISSION DATE:  08/30/2022, CONSULTATION DATE:  08/30/22 REFERRING MD:  Nicanor Alcon- ED, CHIEF COMPLAINT:  respiratory failure   History of Present Illness:  History obtained from husband and daughter in the ED.  Gloria Sharp is a 77 y/o woman with a history of DM, HTN, FM, IBS who presented after having sudden onset SOB tonight. She had just gotten back in bed from going to the bathroom when she told her husband that she couldn't breathe and wanted him to call EMS. When the fire department got there she was unconscious and ineffectively breathing so they started BVM until EMS arrived. She remained unconscious requiring BVM all the way to the hospital. She was intubated in the ED. She has no history of blood clots or MI. She has known murmurs and 2-vessel CAD since LHC in 05/2022. She is on plavix chronically but no other blood thinners. She has not had fever, chills, cough, or new GI symptoms different from her chronic diarrhea from her IBS. Family reports nothing like this has ever happened before. She is a non-smoker and does not drink or use drugs. She has not had recent medication changes. She went to pain clinic 3 days ago for back pain but did not get an injection or new meds.   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 rate (!) 22, height 5' (1.524 m), weight 96.6 kg, SpO2 (!) 86%.    Vent Mode: PRVC FiO2 (%):  [100 %] 100 % Set Rate:  [25 bmp] 25 bmp Vt Set:  [400 mL] 400 mL PEEP:  [12 cmH20-15 cmH20] 15 cmH20 Plateau Pressure:  [23 cmH20] 23  cmH20  No intake or output data in the 24 hours ending 08/30/22 0432 Filed Weights   08/30/22 0339  Weight: 96.6 kg    Examination: General: critically ill appearing woman lying in bed in NAD HENT: [place]/AT, eyes anicteric, pupils reactive to light Lungs: CTAB. Pplat 28 Cardiovascular: S1S2, tachycardic, reg rhythm Abdomen: obese, soft, hypoactive bowel sounds Extremities: no pitting edema, no cyanosis Neuro: RASS -5, PERRL Derm: no diffuse rashes, no cyanosis  CTA chest personally reviewed: pulmonary edema and dense lower lobe consolidations with air bronchograms,. No PE.   Head CT: no acute findings  EKG: sinus tach, LBBB, ST depressions with TWI in lateral leads.  Trop 584 LA 4.0  7.13/62/85/21  WBC 16.2 H/H 10.9/36.5 Platelets 517 INR 1.3 BG 300s  UA:  >30 WBC  Resolved Hospital Problem list     Assessment & Plan:  Acute respiratory failure with hypoxia & hypercapnia Acute pulmonary edema-- history suggests flash pulmonary edema. Unsure if this was hypertensive emergency, ACS, or tachyarrhythmia -LTVV -VAP prevention protocol -PAD protocol -lasix; stopping IV maintenance fluids -empiric antibiotics  Shock- unclear if septic vs cardiogenic -CVC to be placed, check coox -serial trops -echo -empiric antibiotics; blood cultures collected in ED -hold PTA amlodipine, irbesartan  Lactic acidosis -correct hypoxia -empiric antibiotics -maintain adequate perfusion  Troponin elevation -cardiology has evaluated, no plans for LHC -empiric heparin, statin -con't PTA plavix rather than  aspirin due to allergy  Acute encephalopathy-possibly due to hypoxia; head CT unrevealing.  -EEG  -correct metabolic abnormalities -hold PTA tizanidine & gabapentin, but can resume if she starts waking up to prevent withdrawal  Sinus tachycardia -hold amiodarone for now  UTI; no previous urine cultures available -check urine culture -empiric antibiotics- aztreonam and vanc  should cover adequately  Uncontrolled hyperglycemia, DM -SSI PRN -goal BG 140-180 -hold PTA metformin, pioglitazone  Chronic anemia -need to monitor for chronic GIB with her history of PUD  Mild transaminase elevation -supportive care, monitor  AKI vs CKA 3a -strict I/O -monitor -renally dose meds, avoid nephrotoxic meds  Chronic back pain -hold tizanidine for now -hold PTA gabapentin, but can resume if she starts waking up to prevent withdrawal  Husband and daughter updated at bedside. They confirmed full code.    Best Practice (right click and "Reselect all SmartList Selections" daily)   Diet/type: NPO DVT prophylaxis: systemic heparin GI prophylaxis: PPI Lines: Central line Foley:  Yes, and it is still needed Code Status:  full code Last date of multidisciplinary goals of care discussion [8/15]  Labs   CBC: Recent Labs  Lab 08/30/22 0320 08/30/22 0322  WBC  --  16.2*  NEUTROABS  --  11.7*  HGB 11.2* 10.9*  12.6  HCT 33.0* 36.5  37.0  MCV  --  89.7  PLT  --  517*    Basic Metabolic Panel: Recent Labs  Lab 08/30/22 0320 08/30/22 0322  NA 138 135  139  K 4.9 4.7  4.7  CL  --  106  105  CO2  --  15*  GLUCOSE  --  295*  300*  BUN  --  25*  27*  CREATININE  --  1.07*  1.20*  CALCIUM  --  8.6*   GFR: Estimated Creatinine Clearance: 47.3 mL/min (A) (by C-G formula based on SCr of 1.07 mg/dL (H)). Recent Labs  Lab 08/30/22 0322 08/30/22 0324  WBC 16.2*  --   LATICACIDVEN  --  4.0*    Liver Function Tests: Recent Labs  Lab 08/30/22 0322  AST 42*  ALT 28  ALKPHOS 75  BILITOT 0.7  PROT 6.3*  ALBUMIN 3.3*   No results for input(s): "LIPASE", "AMYLASE" in the last 168 hours. No results for input(s): "AMMONIA" in the last 168 hours.  ABG    Component Value Date/Time   PHART 7.104 (LL) 08/30/2022 0320   PCO2ART 57.6 (H) 08/30/2022 0320   PO2ART 103 08/30/2022 0320   HCO3 18.0 (L) 08/30/2022 0320   TCO2 20 (L) 08/30/2022 0322    ACIDBASEDEF 12.0 (H) 08/30/2022 0320   O2SAT 95 08/30/2022 0320     Coagulation Profile: Recent Labs  Lab 08/30/22 0322  INR 1.3*    Cardiac Enzymes: No results for input(s): "CKTOTAL", "CKMB", "CKMBINDEX", "TROPONINI" in the last 168 hours.  HbA1C: Hgb A1c MFr Bld  Date/Time Value Ref Range Status  05/15/2022 06:30 PM 5.9 (H) 4.8 - 5.6 % Final    Comment:    (NOTE) Pre diabetes:          5.7%-6.4%  Diabetes:              >6.4%  Glycemic control for   <7.0% adults with diabetes   03/27/2021 02:58 AM 6.6 (H) 4.8 - 5.6 % Final    Comment:    (NOTE)         Prediabetes: 5.7 - 6.4  Diabetes: >6.4         Glycemic control for adults with diabetes: <7.0     CBG: Recent Labs  Lab 08/30/22 0312  GLUCAP 304*    Review of Systems:   Unable to be obtained due to mental status.   Past Medical History:  She,  has a past medical history of AKI (acute kidney injury) (HCC) (03/27/2021), Angina pectoris (HCC) (05/15/2022), Aortic stenosis (09/04/2019), 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 sciatica (02/12/2022), Class 3 obesity (HCC) (03/26/2021), CVA (cerebral vascular accident) (HCC) (2015?), Cystocele with uterine prolapse (01/17/2018), Diabetes (HCC), Dizziness (03/27/2021), Dyslipidemia (10/10/2018), Essential hypertension (10/10/2018), Fibromyalgia, GERD (gastroesophageal reflux disease), Heart murmur (10/10/2018), History of CVA (cerebrovascular accident) (10/10/2018), Hydronephrosis of right kidney (03/26/2021), Hypercholesterolemia, Hypertension, Hypokalemia (03/27/2021), Leukocytosis, Mitral regurgitation (11/24/2018), Nonsustained ventricular tachycardia (HCC) (11/24/2018), Obesity, Post-operative nausea and vomiting, Rectocele (01/17/2018), Sacroiliitis (HCC) (02/12/2022), Sepsis secondary to UTI (HCC) (03/26/2021), Type 2 diabetes mellitus with hyperglycemia (HCC) (03/26/2021), and Type 2 diabetes mellitus without complication, without long-term current use of  insulin (HCC) (10/10/2018).   Surgical History:   Past Surgical History:  Procedure Laterality Date   ABDOMINAL HYSTERECTOMY     partial, left the ovaries   APPENDECTOMY     BREAST CYST ASPIRATION Right    COLONOSCOPY  11/11/2013   Colonioc polyps status post polypectomy. Pancolonic diverticulosis predominately in the sigmoid colon   CYSTOSCOPY W/ URETERAL STENT PLACEMENT Right 03/26/2021   Procedure: CYSTOSCOPY WITH RETROGRADE PYELOGRAM/URETERAL STENT PLACEMENT;  Surgeon: Jannifer Hick, MD;  Location: [facility];  Service: Urology;  Laterality: Right;   CYSTOSCOPY/URETEROSCOPY/HOLMIUM LASER/STENT PLACEMENT Right 04/17/2021   Procedure: CYSTOSCOPY/ RETROGRADE/URETEROSCOPY/HOLMIUM LASER/STENT PLACEMENT;  Surgeon: Jannifer Hick, MD;  Location: [facility];  Service: Urology;  Laterality: Right;  ONLY NEEDS 60 MIN   DILATION AND CURETTAGE OF UTERUS     GALLBLADDER SURGERY Right 07/2014   HERNIA REPAIR  02/20/2021   Umbilical   LEFT HEART CATH AND CORONARY ANGIOGRAPHY N/A 05/16/2022   Procedure: LEFT HEART CATH AND CORONARY ANGIOGRAPHY;  Surgeon: Iran Ouch, MD;  Location: [facility];  Service: Cardiovascular;  Laterality: N/A;   vaginal polyp removal       Social History:   reports that she has never smoked. She has never used smokeless tobacco. She reports that she does not currently use alcohol. She reports that she does not use drugs.   Family History:  Her family history includes Cancer in her mother; Diabetes in her mother; Heart disease in her father; Heart failure in her mother; Hypertension in her father and mother; Stroke in her mother. There is no history of Colon cancer, Esophageal cancer, Stomach cancer, Rectal cancer, or Colon polyps.   Allergies Allergies  Allergen Reactions   Celebrex [Celecoxib] Itching and Swelling    Swelling throughout body Skin redness    Keflex [Cephalexin] Itching   Relafen [Nabumetone] Itching and Swelling   Cipro [Ciprofloxacin Hcl]  Hives, Itching and Rash   Flagyl [Metronidazole] Itching, Swelling and Rash     Home Medications  Prior to Admission medications   Medication Sig Start Date End Date Taking? Authorizing Provider  acetaminophen (TYLENOL) 650 MG CR tablet Take 650 mg by mouth daily as needed for pain.    [provider]  alendronate (FOSAMAX) 35 MG tablet Take 35 mg by mouth every Monday.    [provider]  amLODipine (NORVASC) 10 MG tablet Take 1 tablet (10 mg total) by mouth daily. 05/18/22   Perlie Gold, PA-C  atorvastatin (LIPITOR) 80 MG tablet Take 1 tablet (80 mg total) by mouth daily. 05/18/22   Perlie Gold, PA-C  brimonidine (ALPHAGAN) 0.15 % ophthalmic solution Place 1 drop into both eyes 2 (two) times daily. 08/10/19   [provider]  bupivacaine (MARCAINE) 0.5 % injection 0.5 mLs by Other route once. Pain lumbar medial branch block    [provider]  carvedilol (COREG) 12.5 MG tablet Take 1 tablet (12.5 mg total) by mouth 2 (two) times daily with a meal. 05/17/22   Perlie Gold, PA-C  cholestyramine (QUESTRAN) 4 g packet Take 1 packet (4 g total) by mouth daily. Take at least 2 hours before or after rest of the medications Patient taking differently: Take 4 g by mouth every 3 (three) days. 03/25/19   Lynann Bologna, MD  clopidogrel (PLAVIX) 75 MG tablet Take 1 tablet (75 mg total) by mouth daily. 05/18/22   Perlie Gold, PA-C  Cyanocobalamin (VITAMIN B-12 PO) Take 1 tablet by mouth daily in the afternoon.    [provider]  diclofenac Sodium (VOLTAREN) 1 % GEL Apply 2-4 g topically at bedtime as needed (knee pain).    [provider]  fenofibrate (TRICOR) 145 MG tablet Take 145 mg by mouth daily.    [provider]  ferrous sulfate 325 (65 FE) MG EC tablet Take 325 mg by mouth daily in the afternoon.    [provider]  furosemide (LASIX) 20 MG tablet Take 1 tablet (20 mg total) by mouth daily. 05/17/22   Perlie Gold, PA-C   gabapentin (NEURONTIN) 300 MG capsule Take 300 mg by mouth 3 (three) times daily.    [provider]  Inf[employer] [facility]) OINT Apply 1 application  topically 2 (two) times daily as needed (itching).    [provider]  irbesartan (AVAPRO) 150 MG tablet Take 1 tablet (150 mg total) by mouth daily. 05/18/22   Perlie Gold, PA-C  MAGNESIUM PO Take 1 tablet by mouth daily in the afternoon.    [provider]  metFORMIN (GLUCOPHAGE) 1000 MG tablet Take 1,000 mg by mouth 2 (two) times daily with a meal.    [provider]  Omega-3 Fatty Acids (FISH OIL PO) Take 1 capsule by mouth daily in the afternoon.    [provider]  OVER THE COUNTER MEDICATION Apply 1-4 patches topically daily as needed (back pain). [place] pain patch    [provider]  pantoprazole (PROTONIX) 40 MG tablet Take 40 mg by mouth daily.    [provider]  pioglitazone (ACTOS) 15 MG tablet Take 15 mg by mouth daily. 01/25/20   [provider]  potassium chloride SA (KLOR-CON M) 20 MEQ tablet Take 2 tablets (40 mEq total) by mouth daily. 05/17/22   Perlie Gold, PA-C  tiZANidine (ZANAFLEX) 2 MG tablet Take 2 mg by mouth every 6 (six) hours as needed for muscle spasms.    [provider]  TURMERIC PO Take 1 tablet by mouth daily in the afternoon.    [provider]     Critical care time: 50 min.     Steffanie Dunn, DO 08/30/22 5:43 AM [facility]  For contact information, see Amion. If no response to pager, please call PCCM consult pager. After hours, 7PM- 7AM, please call Elink.

## 2022-08-31 ENCOUNTER — Encounter (HOSPITAL_COMMUNITY): Payer: Self-pay | Admitting: Internal Medicine

## 2022-08-31 DIAGNOSIS — R6521 Severe sepsis with septic shock: Secondary | ICD-10-CM | POA: Diagnosis not present

## 2022-08-31 DIAGNOSIS — J9601 Acute respiratory failure with hypoxia: Secondary | ICD-10-CM | POA: Diagnosis not present

## 2022-08-31 DIAGNOSIS — R570 Cardiogenic shock: Secondary | ICD-10-CM

## 2022-08-31 DIAGNOSIS — I350 Nonrheumatic aortic (valve) stenosis: Secondary | ICD-10-CM | POA: Diagnosis not present

## 2022-08-31 DIAGNOSIS — I5021 Acute systolic (congestive) heart failure: Secondary | ICD-10-CM

## 2022-08-31 DIAGNOSIS — J9602 Acute respiratory failure with hypercapnia: Secondary | ICD-10-CM | POA: Diagnosis not present

## 2022-08-31 DIAGNOSIS — I340 Nonrheumatic mitral (valve) insufficiency: Secondary | ICD-10-CM

## 2022-08-31 DIAGNOSIS — E7800 Pure hypercholesterolemia, unspecified: Secondary | ICD-10-CM

## 2022-08-31 DIAGNOSIS — N390 Urinary tract infection, site not specified: Secondary | ICD-10-CM

## 2022-08-31 DIAGNOSIS — I2510 Atherosclerotic heart disease of native coronary artery without angina pectoris: Secondary | ICD-10-CM | POA: Diagnosis not present

## 2022-08-31 DIAGNOSIS — A4151 Sepsis due to Escherichia coli [E. coli]: Secondary | ICD-10-CM

## 2022-08-31 DIAGNOSIS — R7989 Other specified abnormal findings of blood chemistry: Secondary | ICD-10-CM

## 2022-08-31 LAB — ECHOCARDIOGRAM COMPLETE
AR max vel: 1.08 cm2
AV Area VTI: 1.11 cm2
AV Area mean vel: 1.13 cm2
AV Mean grad: 11 mmHg
AV Peak grad: 18.7 mmHg
Ao pk vel: 2.16 m/s
Area-P 1/2: 4.31 cm2
Calc EF: 29.3 %
Est EF: 25
Height: 60 in
MV M vel: 4.53 m/s
MV Peak grad: 82.1 mmHg
Radius: 0.4 cm
S' Lateral: 4 cm
Single Plane A2C EF: 27.5 %
Single Plane A4C EF: 34.2 %
Weight: 3407.43 [oz_av]

## 2022-08-31 LAB — CBC
HCT: 26.8 % — ABNORMAL LOW (ref 36.0–46.0)
Hemoglobin: 8.5 g/dL — ABNORMAL LOW (ref 12.0–15.0)
MCH: 27.3 pg (ref 26.0–34.0)
MCHC: 31.7 g/dL (ref 30.0–36.0)
MCV: 86.2 fL (ref 80.0–100.0)
Platelets: 365 10*3/uL (ref 150–400)
RBC: 3.11 MIL/uL — ABNORMAL LOW (ref 3.87–5.11)
RDW: 15.1 % (ref 11.5–15.5)
WBC: 17 10*3/uL — ABNORMAL HIGH (ref 4.0–10.5)
nRBC: 0 % (ref 0.0–0.2)

## 2022-08-31 LAB — BASIC METABOLIC PANEL
Anion gap: 14 (ref 5–15)
BUN: 30 mg/dL — ABNORMAL HIGH (ref 8–23)
CO2: 20 mmol/L — ABNORMAL LOW (ref 22–32)
Calcium: 8.1 mg/dL — ABNORMAL LOW (ref 8.9–10.3)
Chloride: 108 mmol/L (ref 98–111)
Creatinine, Ser: 1.32 mg/dL — ABNORMAL HIGH (ref 0.44–1.00)
GFR, Estimated: 42 mL/min — ABNORMAL LOW (ref >60)
Glucose, Bld: 140 mg/dL — ABNORMAL HIGH (ref 70–99)
Potassium: 3.1 mmol/L — ABNORMAL LOW (ref 3.5–5.1)
Sodium: 142 mmol/L (ref 135–145)

## 2022-08-31 LAB — MAGNESIUM
Magnesium: 2.8 mg/dL — ABNORMAL HIGH (ref 1.7–2.4)
Magnesium: 1.7 mg/dL (ref 1.7–2.4)

## 2022-08-31 LAB — COMPREHENSIVE METABOLIC PANEL
ALT: 24 U/L (ref 0–44)
ALT: 23 U/L (ref 0–44)
AST: 35 U/L (ref 15–41)
AST: 37 U/L (ref 15–41)
Albumin: 2.7 g/dL — ABNORMAL LOW (ref 3.5–5.0)
Albumin: 2.6 g/dL — ABNORMAL LOW (ref 3.5–5.0)
Alkaline Phosphatase: 57 U/L (ref 38–126)
Alkaline Phosphatase: 60 U/L (ref 38–126)
Anion gap: 11 (ref 5–15)
Anion gap: 11 (ref 5–15)
BUN: 26 mg/dL — ABNORMAL HIGH (ref 8–23)
BUN: 23 mg/dL (ref 8–23)
CO2: 20 mmol/L — ABNORMAL LOW (ref 22–32)
CO2: 22 mmol/L (ref 22–32)
Calcium: 8 mg/dL — ABNORMAL LOW (ref 8.9–10.3)
Calcium: 8 mg/dL — ABNORMAL LOW (ref 8.9–10.3)
Chloride: 107 mmol/L (ref 98–111)
Chloride: 106 mmol/L (ref 98–111)
Creatinine, Ser: 1.08 mg/dL — ABNORMAL HIGH (ref 0.44–1.00)
Creatinine, Ser: 1.07 mg/dL — ABNORMAL HIGH (ref 0.44–1.00)
GFR, Estimated: 54 mL/min — ABNORMAL LOW (ref >60)
GFR, Estimated: 54 mL/min — ABNORMAL LOW (ref >60)
Glucose, Bld: 144 mg/dL — ABNORMAL HIGH (ref 70–99)
Glucose, Bld: 113 mg/dL — ABNORMAL HIGH (ref 70–99)
Potassium: 4.2 mmol/L (ref 3.5–5.1)
Potassium: 3.3 mmol/L — ABNORMAL LOW (ref 3.5–5.1)
Sodium: 138 mmol/L (ref 135–145)
Sodium: 139 mmol/L (ref 135–145)
Total Bilirubin: 0.8 mg/dL (ref 0.3–1.2)
Total Bilirubin: 1.2 mg/dL (ref 0.3–1.2)
Total Protein: 5.5 g/dL — ABNORMAL LOW (ref 6.5–8.1)
Total Protein: 5.3 g/dL — ABNORMAL LOW (ref 6.5–8.1)

## 2022-08-31 LAB — TRIGLYCERIDES: Triglycerides: 255 mg/dL — ABNORMAL HIGH (ref <150)

## 2022-08-31 LAB — HEPARIN LEVEL (UNFRACTIONATED): Heparin Unfractionated: <0.1 [IU]/mL — ABNORMAL LOW (ref 0.30–0.70)

## 2022-08-31 LAB — GLUCOSE, CAPILLARY
Glucose-Capillary: 130 mg/dL — ABNORMAL HIGH (ref 70–99)
Glucose-Capillary: 122 mg/dL — ABNORMAL HIGH (ref 70–99)
Glucose-Capillary: 113 mg/dL — ABNORMAL HIGH (ref 70–99)
Glucose-Capillary: 116 mg/dL — ABNORMAL HIGH (ref 70–99)
Glucose-Capillary: 127 mg/dL — ABNORMAL HIGH (ref 70–99)

## 2022-08-31 LAB — COOXEMETRY PANEL
Carboxyhemoglobin: 1.8 % — ABNORMAL HIGH (ref 0.5–1.5)
Methemoglobin: <0.7 % (ref 0.0–1.5)
O2 Saturation: 72.3 %
Total hemoglobin: 9.1 g/dL — ABNORMAL LOW (ref 12.0–16.0)

## 2022-08-31 MED ORDER — DICLOFENAC SODIUM 1 % EX GEL
2.0000 g | Freq: Every evening | CUTANEOUS | Status: DC | PRN
  Administered 2022-09-01: 4 g via TOPICAL
  Filled 2022-08-31: qty 100

## 2022-08-31 MED ORDER — POLYETHYLENE GLYCOL 3350 17 G PO PACK: 17.0000 g | PACK | Freq: Every day | ORAL | Status: DC | PRN

## 2022-08-31 MED ORDER — PANTOPRAZOLE SODIUM 40 MG PO TBEC
40.0000 mg | DELAYED_RELEASE_TABLET | Freq: Every day | ORAL | Status: DC
  Administered 2022-09-01 – 2022-09-05 (×4): 40 mg via ORAL
  Filled 2022-08-31 (×5): qty 1

## 2022-08-31 MED ORDER — DOCUSATE SODIUM 50 MG/5ML PO LIQD: 100.0000 mg | Freq: Two times a day (BID) | ORAL | Status: DC

## 2022-08-31 MED ORDER — POTASSIUM CHLORIDE 10 MEQ/50ML IV SOLN
10.0000 meq | INTRAVENOUS | Status: AC
  Administered 2022-08-31 (×4): 10 meq via INTRAVENOUS
  Filled 2022-08-31: qty 50

## 2022-08-31 MED ORDER — FAMOTIDINE 20 MG PO TABS
20.0000 mg | ORAL_TABLET | Freq: Every day | ORAL | Status: DC
  Administered 2022-09-01 – 2022-09-05 (×4): 20 mg via ORAL
  Filled 2022-08-31 (×5): qty 1

## 2022-08-31 MED ORDER — LOSARTAN POTASSIUM 25 MG PO TABS: 25.0000 mg | ORAL_TABLET | Freq: Every day | ORAL | Status: DC

## 2022-08-31 MED ORDER — ORAL CARE MOUTH RINSE: 15.0000 mL | OROMUCOSAL | Status: DC | PRN

## 2022-08-31 MED ORDER — IPRATROPIUM-ALBUTEROL 0.5-2.5 (3) MG/3ML IN SOLN: 3.0000 mL | Freq: Two times a day (BID) | RESPIRATORY_TRACT | Status: DC

## 2022-08-31 MED ORDER — TIZANIDINE HCL 4 MG PO TABS
2.0000 mg | ORAL_TABLET | Freq: Three times a day (TID) | ORAL | Status: DC | PRN
  Administered 2022-08-31 – 2022-09-03 (×3): 2 mg via ORAL
  Filled 2022-08-31 (×4): qty 1

## 2022-08-31 MED ORDER — POTASSIUM CHLORIDE 20 MEQ PO PACK
20.0000 meq | PACK | ORAL | Status: AC
  Administered 2022-08-31 (×2): 20 meq
  Filled 2022-08-31 (×2): qty 1

## 2022-08-31 MED ORDER — ENOXAPARIN SODIUM 40 MG/0.4ML IJ SOSY
40.0000 mg | PREFILLED_SYRINGE | Freq: Every day | INTRAMUSCULAR | Status: DC
  Administered 2022-08-31 – 2022-09-04 (×5): 40 mg via SUBCUTANEOUS
  Filled 2022-08-31 (×5): qty 0.4

## 2022-08-31 MED ORDER — POLYETHYLENE GLYCOL 3350 17 G PO PACK: 17.0000 g | PACK | Freq: Every day | ORAL | Status: DC

## 2022-08-31 MED ORDER — MAGNESIUM SULFATE 2 GM/50ML IV SOLN
2.0000 g | Freq: Once | INTRAVENOUS | Status: AC
  Administered 2022-08-31: 2 g via INTRAVENOUS
  Filled 2022-08-31: qty 50

## 2022-08-31 MED ORDER — ATORVASTATIN CALCIUM 80 MG PO TABS: 80.0000 mg | ORAL_TABLET | Freq: Every day | ORAL | Status: DC

## 2022-08-31 MED ORDER — POTASSIUM CHLORIDE CRYS ER 20 MEQ PO TBCR
20.0000 meq | EXTENDED_RELEASE_TABLET | ORAL | Status: AC
  Administered 2022-08-31 – 2022-09-01 (×2): 20 meq via ORAL
  Filled 2022-08-31 (×2): qty 1

## 2022-08-31 MED ORDER — PHENOL 1.4 % MT LIQD: 1.0000 | OROMUCOSAL | Status: DC | PRN

## 2022-08-31 MED ORDER — SPIRONOLACTONE 12.5 MG HALF TABLET
12.5000 mg | ORAL_TABLET | Freq: Every day | ORAL | Status: DC
  Administered 2022-09-01 – 2022-09-02 (×2): 12.5 mg via ORAL
  Filled 2022-08-31 (×2): qty 1

## 2022-08-31 MED ORDER — METOLAZONE 2.5 MG PO TABS
2.5000 mg | ORAL_TABLET | Freq: Two times a day (BID) | ORAL | Status: DC
  Administered 2022-08-31: 2.5 mg via ORAL
  Filled 2022-08-31: qty 1

## 2022-08-31 MED ORDER — DOCUSATE SODIUM 50 MG/5ML PO LIQD: 100.0000 mg | Freq: Two times a day (BID) | ORAL | Status: DC | PRN

## 2022-08-31 MED ORDER — ACETAMINOPHEN 325 MG PO TABS
650.0000 mg | ORAL_TABLET | ORAL | Status: DC | PRN
  Administered 2022-08-31 – 2022-09-03 (×6): 650 mg via ORAL
  Filled 2022-08-31 (×6): qty 2

## 2022-08-31 MED ORDER — FAMOTIDINE 20 MG PO TABS
20.0000 mg | ORAL_TABLET | Freq: Every day | ORAL | Status: DC
  Administered 2022-08-31: 20 mg
  Filled 2022-08-31: qty 1

## 2022-08-31 MED ORDER — GABAPENTIN 100 MG PO CAPS
100.0000 mg | ORAL_CAPSULE | Freq: Three times a day (TID) | ORAL | Status: DC
  Administered 2022-08-31 – 2022-09-05 (×14): 100 mg via ORAL
  Filled 2022-08-31 (×15): qty 1

## 2022-08-31 MED ORDER — FENOFIBRATE 160 MG PO TABS
160.0000 mg | ORAL_TABLET | Freq: Every day | ORAL | Status: DC
  Administered 2022-09-01 – 2022-09-05 (×4): 160 mg via ORAL
  Filled 2022-08-31 (×5): qty 1

## 2022-08-31 MED ORDER — BRIMONIDINE TARTRATE 0.15 % OP SOLN
1.0000 [drp] | Freq: Two times a day (BID) | OPHTHALMIC | Status: DC
  Administered 2022-08-31 – 2022-09-05 (×10): 1 [drp] via OPHTHALMIC
  Filled 2022-08-31: qty 5

## 2022-08-31 MED ORDER — ACETAMINOPHEN 325 MG PO TABS: 650.0000 mg | ORAL_TABLET | Freq: Four times a day (QID) | ORAL | Status: DC | PRN

## 2022-08-31 MED ORDER — CLOPIDOGREL BISULFATE 75 MG PO TABS
75.0000 mg | ORAL_TABLET | Freq: Every day | ORAL | Status: DC
  Administered 2022-09-01 – 2022-09-05 (×5): 75 mg via ORAL
  Filled 2022-08-31 (×5): qty 1

## 2022-08-31 MED ORDER — POTASSIUM CHLORIDE 10 MEQ/50ML IV SOLN
10.0000 meq | INTRAVENOUS | Status: AC
  Administered 2022-08-31 – 2022-09-01 (×4): 10 meq via INTRAVENOUS
  Filled 2022-08-31: qty 50

## 2022-08-31 MED ORDER — ORAL CARE MOUTH RINSE: 15.0000 mL | OROMUCOSAL | Status: DC

## 2022-08-31 MED ORDER — ATORVASTATIN CALCIUM 80 MG PO TABS
80.0000 mg | ORAL_TABLET | Freq: Every day | ORAL | Status: DC
  Administered 2022-09-01 – 2022-09-05 (×4): 80 mg via ORAL
  Filled 2022-08-31 (×5): qty 1

## 2022-08-31 MED ORDER — SERTRALINE HCL 25 MG PO TABS
50.0000 mg | ORAL_TABLET | Freq: Every day | ORAL | Status: DC
  Administered 2022-09-01 – 2022-09-05 (×4): 50 mg via ORAL
  Filled 2022-08-31: qty 2
  Filled 2022-08-31: qty 1
  Filled 2022-08-31 (×3): qty 2

## 2022-08-31 NOTE — Consult Note (Addendum)
Advanced Heart Failure Team Consult Note   Primary Physician: Abner Greenspan, MD PCP-Cardiologist:  Gypsy Balsam, MD  Reason for Consultation: Acute Systolic Heart Failure, LFLG AS + Mod MR   HPI:    Gloria Sharp is seen today for evaluation of acute systolic heart failure w/ low flow low gradient aortic stenosis and mod MR at the request of Dr. Mayford Knife, Cardiology.   77 y/o female w/ h/o medically managed CAD, mod AS, T2DM, HTN, HLD, h/o CVA and GIBs (per report sounds like AVMs s/p cauterization). Also w/ fibromyalgia and severe back pain from spinal stenosis limiting mobility. 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   Had recent admission 5/1-05/17/22 for chest pain. LHC showed moderate 2-vessel disease with mid LAD 65% and distal Cx 65%. At that point, opted for medical management given PCI of the LAD would require long stent and jailing large D2. 2D Echo showed normal EF, mild MS, moderate AS (PV = 3, AVA = 1.1, MG = 20, PG = 34, DI = 0.39).   Pt has recent UTI ~2-3 wks ago, treated w/ round of abx w/ improvement in urinary symptoms. Had felt fatigued last several days but otherwise was in USOH.   Presented to ED by EMS 8/15 w/ acute hypoxic respiratory failure and AMS. Pt had reportedly complained of acute onset dyspnea prior to becoming unresponsive but no reports of CP. Required BVM by EMS and ultimately intubated in the ED. Was in shock w/ lactic acidosis, lactate 4.0. Febrile w/ temp 101.3. Hypotensive requiring pressor support. Pertinent labs: WBC 16K, HGb 10.9, NA 132, K 4.8, Co2 15, Gluc 295, BUN 25, Scr 1.07, AST 42, ALT 28, Anion gap 14, Hs trop 1,948, BNP 458. EKG ST w/ known LBBB. UA suggestive of UTI. CXR w/ diffuse increased density throughout Rt lung, representative of asymmetric edema or focal infiltrate. Respiratory panel negative. BCx  obtained and started on broad spectrum abx. Central access was obtained. Initial co-ox in the 90s.   Echo was repeated showing drop in LVEF down to 25% (normal in May), global HK, normal RV, mod MR + mod calcified aortic valve w/ estimated gradient 11.0 mmHg. Subsequently underwent R/LHC which demonstrated unchanged moderate multivessel disease with no acute occlusions (see complete angiographic details below). RHC: RA 12, PAP 54/28(40), PCWP 24, LVEDP 30, Fick cardiac output of 7.8 L/min and Fick cardiac index of 4.1 L/min/m with thermodilution cardiac output of 6.3 L/min and thermodilution cardiac index of 3.3 L/min/m with the following hemodynamics. PVR 2WU. Diuresed w/ IV Lasix post cath.   Today, she was extubated. Remains on abx w/ cefepime. Presumed source of infection is UTI. C/w fevers, mTemp today 100. BCx NGTD. Now off pressor support. Earlier today, she developed sinus tachycardia and concern for developing cardiogenic shock. Co-ox repeated and stable at 72%. AHF team consulted to assist w/ further management of acute systolic heart failure. SCr remains stable at 1.08. K 4.2. SBPs 120s. Good UOP w/ IV Lasix. CVP 6-7. PAP 41/23 (34).   Feeling better. No current resting dyspnea. As noted above, NYHA Functional Class difficult to ascertain as mobility more limited by orthopedic issues.   FH: father had CHF and valvular heart disease "leaky valve". Mother had CHF    2D Echo 08/30/22 1. Left ventricular ejection fraction, by estimation, is 25%. The left  ventricle has severely  decreased function. The left ventricle demonstrates  global hypokinesis. Left ventricular diastolic parameters are consistent  with Grade I diastolic dysfunction   (impaired relaxation).   2. Right ventricular systolic function is normal. The right ventricular  size is normal. There is moderately elevated pulmonary artery systolic  pressure.   3. Left atrial size was mildly dilated.   4. The mitral valve is  degenerative. Moderate mitral valve regurgitation.  No evidence of mitral stenosis. Moderate mitral annular calcification.   5. The aortic valve is tricuspid. There is moderate calcification of the  aortic valve. Aortic valve regurgitation is not visualized. Moderate  aortic valve stenosis. Aortic valve area, by VTI measures 1.11 cm. Aortic  valve mean gradient measures 11.0  mmHg. Aortic valve Vmax measures 2.16 m/s.   6. The inferior vena cava is dilated in size with <50% respiratory  variability, suggesting right atrial pressure of 15 mmHg.    [facility] 08/30/22   Mid LAD lesion is 65% stenosed.   Dist Cx lesion is 65% stenosed.   Ost Cx to Prox Cx lesion is 30% stenosed.   Prox RCA-2 lesion is 20% stenosed.   Prox RCA-1 lesion is 30% stenosed.   2nd Diag lesion is 40% stenosed.   RPAV lesion is 80% stenosed.   1.  Unchanged moderate multivessel disease with no acute occlusions. 2.  Fick cardiac output of 7.8 L/min and Fick cardiac index of 4.1 L/min/m with thermodilution cardiac output of 6.3 L/min and thermodilution cardiac index of 3.3 L/min/m with the following hemodynamics:             Right atrial pressure mean of 12 mmHg             Right ventricular pressure 47/12 with an end-diastolic pressure of 19 mmHg             Wedge pressure mean 24 mmHg             PA pressure 54/28 with a mean of 40 mmHg             Pulmonary vascular resistance of 2 Woods units 3.  LVEDP of 30 mmHg  Review of Systems: [y] = yes, [ ]  = no   General: Weight gain [ ] ; Weight loss [ ] ; Anorexia [ ] ; Fatigue [ ] ; Fever [ ] ; Chills [ ] ; Weakness [ ]   Cardiac: Chest pain/pressure [ ] ; Resting SOB [ ] ; Exertional SOB [ ] ; Orthopnea [ ] ; Pedal Edema [ ] ; Palpitations [ ] ; Syncope [ ] ; Presyncope [ ] ; Paroxysmal nocturnal dyspnea[ ]   Pulmonary: Cough [ ] ; Wheezing[ ] ; Hemoptysis[ ] ; Sputum [ ] ; Snoring [ ]   GI: Vomiting[ ] ; Dysphagia[ ] ; Melena[ ] ; Hematochezia [ ] ; Heartburn[ ] ; Abdominal pain [ ] ;  Constipation [ ] ; Diarrhea [ ] ; BRBPR [ ]   GU: Hematuria[ ] ; Dysuria [Y ]; Nocturia[ ]   Vascular: Pain in legs with walking [ ] ; Pain in feet with lying flat [ ] ; Non-healing sores [ ] ; Stroke [ ] ; TIA [ ] ; Slurred speech [ ] ;  Neuro: Headaches[ ] ; Vertigo[ ] ; Seizures[ ] ; Paresthesias[ ] ;Blurred vision [ ] ; Diplopia [ ] ; Vision changes [ ]   Ortho/Skin: Arthritis [ Y]; Joint pain [ Y]; Muscle pain [Y ]; Joint swelling [ ] ; Back Pain [Y ]; Rash [ ]   Psych: Depression[ ] ; Anxiety[ ]   Heme: Bleeding problems [ ] ; Clotting disorders [ ] ; Anemia [ ]   Endocrine: Diabetes [Y ]; Thyroid dysfunction[ ]   Home Medications Prior to Admission medications  Medication Sig Start Date End Date Taking? Authorizing Provider  metFORMIN (GLUCOPHAGE) 1000 MG tablet Take 1,000 mg by mouth 2 (two) times daily with a meal.   Yes [provider]  acetaminophen (TYLENOL) 650 MG CR tablet Take 650 mg by mouth daily as needed for pain.    [provider]  alendronate (FOSAMAX) 35 MG tablet Take 35 mg by mouth every Monday.    [provider]  amLODipine (NORVASC) 10 MG tablet Take 1 tablet (10 mg total) by mouth daily. 05/18/22   Perlie Gold, PA-C  atorvastatin (LIPITOR) 80 MG tablet Take 1 tablet (80 mg total) by mouth daily. 05/18/22   Perlie Gold, PA-C  brimonidine (ALPHAGAN) 0.15 % ophthalmic solution Place 1 drop into both eyes 2 (two) times daily. 08/10/19   [provider]  bupivacaine (MARCAINE) 0.5 % injection 0.5 mLs by Other route once. Pain lumbar medial branch block    [provider]  carvedilol (COREG) 12.5 MG tablet Take 1 tablet (12.5 mg total) by mouth 2 (two) times daily with a meal. 05/17/22   Perlie Gold, PA-C  cholestyramine (QUESTRAN) 4 g packet Take 1 packet (4 g total) by mouth daily. Take at least 2 hours before or after rest of the medications Patient taking differently: Take 4 g by mouth every 3 (three) days. 03/25/19   Lynann Bologna, MD   clopidogrel (PLAVIX) 75 MG tablet Take 1 tablet (75 mg total) by mouth daily. 05/18/22   Perlie Gold, PA-C  Cyanocobalamin (VITAMIN B-12 PO) Take 1 tablet by mouth daily in the afternoon.    [provider]  diclofenac Sodium (VOLTAREN) 1 % GEL Apply 2-4 g topically at bedtime as needed (knee pain).    [provider]  fenofibrate (TRICOR) 145 MG tablet Take 145 mg by mouth daily.    [provider]  ferrous sulfate 325 (65 FE) MG EC tablet Take 325 mg by mouth daily in the afternoon.    [provider]  furosemide (LASIX) 20 MG tablet Take 1 tablet (20 mg total) by mouth daily. Patient not taking: Reported on 08/30/2022 05/17/22   Perlie Gold, PA-C  gabapentin (NEURONTIN) 300 MG capsule Take 300 mg by mouth 3 (three) times daily.    [provider]  Infant Care Products [facility]) OINT Apply 1 application  topically 2 (two) times daily as needed (itching).    [provider]  irbesartan (AVAPRO) 150 MG tablet Take 1 tablet (150 mg total) by mouth daily. 05/18/22   Perlie Gold, PA-C  MAGNESIUM PO Take 1 tablet by mouth daily in the afternoon.    [provider]  Omega-3 Fatty Acids (FISH OIL PO) Take 1 capsule by mouth daily in the afternoon.    [provider]  OVER THE COUNTER MEDICATION Apply 1-4 patches topically daily as needed (back pain). Coralite pain patch    [provider]  pantoprazole (PROTONIX) 40 MG tablet Take 40 mg by mouth daily.    [provider]  pioglitazone (ACTOS) 15 MG tablet Take 15 mg by mouth daily. 01/25/20   [provider]  potassium chloride SA (KLOR-CON M) 20 MEQ tablet Take 2 tablets (40 mEq total) by mouth daily. 05/17/22   Perlie Gold, PA-C  sertraline (ZOLOFT) 50 MG tablet Take 50 mg by mouth daily.    [provider]  tiZANidine (ZANAFLEX) 2 MG tablet Take 2 mg by mouth every 6 (six) hours as needed for muscle spasms.    [provider]   TURMERIC PO Take 1 tablet by mouth daily in the afternoon.    [provider]    Past Medical History: Past Medical History:  Diagnosis Date   AKI (acute kidney injury) (HCC) 03/27/2021   Angina pectoris (HCC) 05/15/2022   Aortic stenosis 09/04/2019   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 sciatica 02/12/2022   Class 3 obesity (HCC) 03/26/2021   CVA (cerebral vascular accident) (HCC) 2015?   Cystocele with uterine prolapse 01/17/2018   Diabetes (HCC)    Dizziness 03/27/2021   Dyslipidemia 10/10/2018   Essential hypertension 10/10/2018   Fibromyalgia    GERD (gastroesophageal reflux disease)    Heart murmur 10/10/2018   History of CVA (cerebrovascular accident) 10/10/2018   Hydronephrosis of right kidney 03/26/2021   Hypercholesterolemia    Hypertension    Hypokalemia 03/27/2021   Leukocytosis    Mitral regurgitation 11/24/2018   Nonsustained ventricular tachycardia (HCC) 11/24/2018   Obesity    Post-operative nausea and vomiting    "hard to wake up"   Rectocele 01/17/2018   Sacroiliitis (HCC) 02/12/2022   Sepsis secondary to UTI (HCC) 03/26/2021   Type 2 diabetes mellitus with hyperglycemia (HCC) 03/26/2021   Type 2 diabetes mellitus without complication, without long-term current use of insulin (HCC) 10/10/2018    Past Surgical History: Past Surgical History:  Procedure Laterality Date   ABDOMINAL HYSTERECTOMY     partial, left the ovaries   APPENDECTOMY     BREAST CYST ASPIRATION Right    COLONOSCOPY  11/11/2013   Colonioc polyps status post polypectomy. Pancolonic diverticulosis predominately in the sigmoid colon   CYSTOSCOPY W/ URETERAL STENT PLACEMENT Right 03/26/2021   Procedure: CYSTOSCOPY WITH RETROGRADE PYELOGRAM/URETERAL STENT PLACEMENT;  Surgeon: Jannifer Hick, MD;  Location: [facility];  Service: Urology;  Laterality: Right;   CYSTOSCOPY/URETEROSCOPY/HOLMIUM LASER/STENT PLACEMENT Right 04/17/2021   Procedure: CYSTOSCOPY/  RETROGRADE/URETEROSCOPY/HOLMIUM LASER/STENT PLACEMENT;  Surgeon: Jannifer Hick, MD;  Location: [facility];  Service: Urology;  Laterality: Right;  ONLY NEEDS 60 MIN   DILATION AND CURETTAGE OF UTERUS     GALLBLADDER SURGERY Right 07/2014   HERNIA REPAIR  02/20/2021   Umbilical   LEFT HEART CATH AND CORONARY ANGIOGRAPHY N/A 05/16/2022   Procedure: LEFT HEART CATH AND CORONARY ANGIOGRAPHY;  Surgeon: Iran Ouch, MD;  Location: [facility];  Service: Cardiovascular;  Laterality: N/A;   RIGHT/LEFT HEART CATH AND CORONARY ANGIOGRAPHY N/A 08/30/2022   Procedure: RIGHT/LEFT HEART CATH AND CORONARY ANGIOGRAPHY;  Surgeon: Orbie Pyo, MD;  Location: [facility];  Service: Cardiovascular;  Laterality: N/A;   vaginal polyp removal      Family History: Family History  Problem Relation Age of Onset   Cancer Mother    Hypertension Mother    Stroke Mother    Diabetes Mother    Heart failure Mother    Hypertension Father    Heart disease Father    Colon cancer Neg Hx    Esophageal cancer Neg Hx    Stomach cancer Neg Hx    Rectal cancer Neg Hx    Colon polyps Neg Hx     Social History: Social History   Socioeconomic History   Marital status: Married    Spouse name: Not on file   Number of children: Not on file   Years of education: Not on file   Highest education level: Not on file  Occupational History   Not on file  Tobacco Use   Smoking status:  Never   Smokeless tobacco: Never  Vaping Use   Vaping status: Never Used  Substance and Sexual Activity   Alcohol use: Not Currently   Drug use: Never   Sexual activity: Not on file  Other Topics Concern   Not on file  Social History Narrative   Not on file   Social Determinants of Health   Financial Resource Strain: Not on file  Food Insecurity: No Food Insecurity (05/15/2022)   Hunger Vital Sign    Worried About Running Out of Food in the Last Year: Never true    Ran Out of Food in the Last Year: Never true   Transportation Needs: No Transportation Needs (05/15/2022)   PRAPARE - Administrator, Civil Service (Medical): No    Lack of Transportation (Non-Medical): No  Physical Activity: Not on file  Stress: Not on file  Social Connections: Not on file    Allergies:  Allergies  Allergen Reactions   Celebrex [Celecoxib] Itching and Swelling    Swelling throughout body Skin redness    Keflex [Cephalexin] Itching    09/2018 note: tolerated Augmentin   Relafen [Nabumetone] Itching and Swelling   Cipro [Ciprofloxacin Hcl] Hives, Itching and Rash   Flagyl [Metronidazole] Itching, Swelling and Rash    Objective:    Vital Signs:   Temp:  [97.5 F (36.4 C)-101.3 F (38.5 C)] 100 F (37.8 C) (08/16 1300) Pulse Rate:  [61-120] 112 (08/16 1300) Resp:  [7-31] 17 (08/16 1300) BP: (50-145)/(30-75) 127/62 (08/16 1300) SpO2:  [91 %-100 %] 99 % (08/16 1300) FiO2 (%):  [40 %-60 %] 40 % (08/16 0756) Weight:  [97.5 kg] 97.5 kg (08/16 0431) Last BM Date :  (prior to arrival)  Weight change: Filed Weights   08/30/22 0339 08/31/22 0431  Weight: 96.6 kg 97.5 kg    Intake/Output:   Intake/Output Summary (Last 24 hours) at 08/31/2022 1407 Last data filed at 08/31/2022 1201 Gross per 24 hour  Intake 2254.97 ml  Output 4045 ml  Net -1790.03 ml      Physical Exam    CVP 6-7  General:  Well appearing, laying in be. No resp difficulty HEENT: normal Neck: supple. JVP not elevated. + Rt internal jugular Swan. Carotids 2+ bilat; no bruits. No lymphadenopathy or thyromegaly appreciated. Cor: PMI nondisplaced. Regular rate & rhythm. 2/6 SEM and 2/6 MR murmur Lungs: clear Abdomen: soft, nontender, nondistended. No hepatosplenomegaly. No bruits or masses. Good bowel sounds. Extremities: no cyanosis, clubbing, rash, edema Neuro: alert & orientedx3, cranial nerves grossly intact. moves all 4 extremities w/o difficulty. Affect pleasant GU" + Foley    Telemetry   Sinus tach 100s,  personally reviewed   EKG    Sinus tach 116 bpm, nonspecific IVCB, QRS 138 ms   Labs   Basic Metabolic Panel: Recent Labs  Lab 08/30/22 0322 08/30/22 0450 08/30/22 1251 08/30/22 1258 08/30/22 1945 08/31/22 0412 08/31/22 1111  NA 135  139   < > 142 144 140 142 138  K 4.7  4.7   < > 3.7 3.4* 2.7* 3.1* 4.2  CL 106  105  --   --   --  105 108 107  CO2 15*  --   --   --  22 20* 20*  GLUCOSE 295*  300*  --   --   --  139* 140* 144*  BUN 25*  27*  --   --   --  25* 30* 26*  CREATININE 1.07*  1.20*  --   --   --  1.11* 1.32* 1.08*  CALCIUM 8.6*  --   --   --  8.2* 8.1* 8.0*  MG  --   --   --   --  1.2* 2.8*  --    < > = values in this interval not displayed.    Liver Function Tests: Recent Labs  Lab 08/30/22 0322 08/31/22 1111  AST 42* 35  ALT 28 24  ALKPHOS 75 57  BILITOT 0.7 0.8  PROT 6.3* 5.5*  ALBUMIN 3.3* 2.7*   No results for input(s): "LIPASE", "AMYLASE" in the last 168 hours. No results for input(s): "AMMONIA" in the last 168 hours.  CBC: Recent Labs  Lab 08/30/22 0322 08/30/22 0450 08/30/22 0824 08/30/22 1249 08/30/22 1251 08/30/22 1258 08/31/22 0412  WBC 16.2*  --   --   --   --   --  17.0*  NEUTROABS 11.7*  --   --   --   --   --   --   HGB 10.9*  12.6   < > 10.9* 10.2* 10.2* 9.9* 8.5*  HCT 36.5  37.0   < > 32.0* 30.0* 30.0* 29.0* 26.8*  MCV 89.7  --   --   --   --   --  86.2  PLT 517*  --   --   --   --   --  365   < > = values in this interval not displayed.    Cardiac Enzymes: No results for input(s): "CKTOTAL", "CKMB", "CKMBINDEX", "TROPONINI" in the last 168 hours.  BNP: BNP (last 3 results) Recent Labs    08/30/22 0322  BNP 458.3*    ProBNP (last 3 results) No results for input(s): "PROBNP" in the last 8760 hours.   CBG: Recent Labs  Lab 08/30/22 1942 08/30/22 2342 08/31/22 0406 08/31/22 0747 08/31/22 1146  GLUCAP 132* 118* 130* 122* 113*    Coagulation Studies: Recent Labs    08/30/22 0322  LABPROT 16.7*   INR 1.3*     Imaging   EEG adult  Result Date: 08/30/2022 Charlsie Quest, MD     08/30/2022  5:10 PM Patient Name: MERRANDA COULSON MRN: [id number] Epilepsy Attending: Charlsie Quest Referring Physician/Provider: Steffanie Dunn, DO Date: 07/30/2022 Duration: 23.56 mins Patient history: 77yo F with ams getting eeg to evaluate for seizure. Level of alertness: comatose AEDs during EEG study: Propofol Technical aspects: This EEG study was done with scalp electrodes positioned according to the 10-20 International system of electrode placement. Electrical activity was reviewed with band pass filter of 1-70Hz , sensitivity of 7 uV/mm, display speed of 42mm/sec with a 60Hz  notched filter applied as appropriate. EEG data were recorded continuously and digitally stored.  Video monitoring was available and reviewed as appropriate. Description:  EEG showed continuous generalized 3 to 6 Hz theta-delta slowing admixed with 15 to 18 Hz beta activity distributed symmetrically and diffusely.  Sharp transients were seen in left centro-temporal region.Hyperventilation and photic stimulation were not performed.   ABNORMALITY - Continuous slow, generalized IMPRESSION: This study is suggestive of severe diffuse encephalopathy, nonspecific etiology. No seizures or definite epileptiform discharges were seen throughout the recording. Priyanka Annabelle Harman     Medications:     Current Medications:  [START ON 09/01/2022] atorvastatin  80 mg Oral Daily   brimonidine  1 drop Both Eyes BID   Chlorhexidine Gluconate Cloth  6 each Topical Daily   [START ON 09/01/2022] clopidogrel  75 mg Oral Daily   [START ON 09/01/2022] famotidine  20 mg Oral Daily   [START ON 09/01/2022] fenofibrate  160 mg Oral Daily   furosemide  40 mg Intravenous BID   gabapentin  100 mg Oral TID   insulin aspart  0-15 Units Subcutaneous Q4H   pantoprazole  40 mg Oral Daily   [START ON 09/01/2022] sertraline  50 mg Oral Daily   sodium chloride flush  3 mL  Intravenous Q12H    Infusions:  sodium chloride     ceFEPime (MAXIPIME) IV Stopped (08/31/22 1117)   phenylephrine (NEO-SYNEPHRINE) Adult infusion Stopped (08/31/22 0930)      Patient Profile   77 y/o female w/ h/o medically managed CAD, mod AS, T2DM, HTN, HLD, h/o CVA, GIBs (per report sounds like AVMs s/p cauterization), fibromyalgia and severe chronic LBP from spinal stenosis limiting mobility, admitted w/ septic shock, likely source UTI. Found to have new systolic heart failure and low flow low gradient AS on echo.  Assessment/Plan   1. Shock - primarily septic shock, presumed source UTI - c/w fevers but improving w/ abx. Now off pressor support. BCx NGTD - co-ox stable, 72% - c/w abx per CCM   2. Acute Systolic Heart Failure  - new. Echo 5/24 normal EF 60-65%, GIIDD, RV normal  - Echo this admit EF 25%, diffuse HK, mod MR/AS, normal RV - Hs trop Hs trop 584>>1,948  - LHC moderate multivessel disease with no acute occlusions (unchanged from prior cath in May) - RHC RA 12, PAP 54/28(40), PCWP 24, LVEDP 30. FICK CO/CI 7.8/4.1, TD 6.3/3.3, PVR 2 WU - now off pressor support. Co-ox stable 72%.  Well diuresed, CVP 6-7 - NICM. Etiology uncertain. Possible stress induced CM from sepsis vs ? Myocarditis given hs trop elevation vs possible infiltrative CM (aortic stenosis, spinal stenosis). Will need cMRI after swan removal (likely tomorrow). Also ? LBBB mediated though no dyssynchrony noted on echo  - will try to introduce low dose GDMT in next 24 hrs as long as BP remains stable off pressors - continue to follow co-ox and CVP  - NYHA Functional Class difficult to ascertain as mobility limited by orthopedic issues.   3. Acute Hypoxic Respiratory Failure - 2/2 shock and acute pulmonary edema - improved, now extubated   4. Aortic Stenosis  - mod calcified, mod stenosis, mG 11.0 mmHg, VTI AoA 1.11 cm2 - suspect LFLG in setting of severely reduced EF - will need to be followed/  addressed after she recovers from acute infection - cMRI per above   5. UTI - abx per CCM   6. CAD - moderate 2-vessel disease with mid LAD 65% and distal Cx 65% - managing medically, as PCI of the LAD would require long stent and jailing large D2 - no CP  - on Plavix + statin   7. Mitral Regurgitation  - mod on echo, likely functional - HF optimization per above  - will need to follow outpatient   Length of Stay: 1  Renette Hsu, PA-C  08/31/2022, 2:07 PM  Advanced Heart Failure Team Pager [phone] (M-F; 7a - 5p)  Please contact [facility] for night-coverage after hours (4p -7a ) and weekends on [email or web address]

## 2022-08-31 NOTE — Procedures (Signed)
Extubation Procedure Note  Patient Details:   Name: Gloria Sharp DOB: 24-Nov-1945 MRN: [id number]   Airway Documentation:    Vent end date: 08/31/22 Vent end time: 0824   Evaluation  O2 sats: stable throughout Complications: No apparent complications Patient did tolerate procedure well. Bilateral Breath Sounds: Clear, Diminished   Yes  Pt extubated to 2L [place], pt tolerating well at this time. Cuff leak present, no stridor, RT x2 at bedside,  RN at bedside, CCM at bedside,RT will monitor as needed.   Thornell Mule 08/31/2022, 10:09 AM

## 2022-08-31 NOTE — Consult Note (Signed)
Advanced Heart Failure Team Consult Note   Primary Physician: Abner Greenspan, MD PCP-Cardiologist:  Gypsy Balsam, MD  Reason for Consultation: Acute Systolic Heart Failure, LFLG AS + Mod MR   HPI:    Gloria Sharp is seen today for evaluation of acute systolic heart failure w/ low flow low gradient aortic stenosis and mod MR at the request of Dr. Mayford Knife, Cardiology.   77 y/o female w/ h/o medically managed CAD, mod AS, T2DM, HTN, HLD, h/o CVA and GIBs (per report sounds like AVMs s/p cauterization). Also w/ fibromyalgia and severe back pain from spinal stenosis limiting mobility. 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   Had recent admission 5/1-05/17/22 for chest pain. LHC showed moderate 2-vessel disease with mid LAD 65% and distal Cx 65%. At that point, opted for medical management given PCI of the LAD would require long stent and jailing large D2. 2D Echo showed normal EF, mild MS, moderate AS (PV = 3, AVA = 1.1, MG = 20, PG = 34, DI = 0.39).   Pt has recent UTI ~2-3 wks ago, treated w/ round of abx w/ improvement in urinary symptoms. Had felt fatigued last several days but otherwise was in USOH.   Presented to ED by EMS 8/15 w/ acute hypoxic respiratory failure and AMS. Pt had reportedly complained of acute onset dyspnea prior to becoming unresponsive but no reports of CP. Required BVM by EMS and ultimately intubated in the ED. Was in shock w/ lactic acidosis, lactate 4.0. Febrile w/ temp 101.3. Hypotensive requiring pressor support. Pertinent labs: WBC 16K, HGb 10.9, NA 132, K 4.8, Co2 15, Gluc 295, BUN 25, Scr 1.07, AST 42, ALT 28, Anion gap 14, Hs trop 1,948, BNP 458. EKG ST w/ known LBBB. UA suggestive of UTI. CXR w/ diffuse increased density throughout Rt lung, representative of asymmetric edema or focal infiltrate. Respiratory panel negative. BCx  obtained and started on broad spectrum abx. Central access was obtained. Initial co-ox in the 90s.   Echo was repeated showing drop in LVEF down to 25% (normal in May), global HK, normal RV, mod MR + mod calcified aortic valve w/ estimated gradient 11.0 mmHg. Subsequently underwent R/LHC which demonstrated unchanged moderate multivessel disease with no acute occlusions (see complete angiographic details below). RHC: RA 12, PAP 54/28(40), PCWP 24, LVEDP 30, Fick cardiac output of 7.8 L/min and Fick cardiac index of 4.1 L/min/m with thermodilution cardiac output of 6.3 L/min and thermodilution cardiac index of 3.3 L/min/m with the following hemodynamics. PVR 2WU. Diuresed w/ IV Lasix post cath.   Today, she was extubated. Remains on abx w/ cefepime. Presumed source of infection is UTI. C/w fevers, mTemp today 100. BCx NGTD. Now off pressor support. Earlier today, she developed sinus tachycardia and concern for developing cardiogenic shock. Co-ox repeated and stable at 72%. AHF team consulted to assist w/ further management of acute systolic heart failure. SCr remains stable at 1.08. K 4.2. SBPs 120s. Good UOP w/ IV Lasix. CVP 6-7. PAP 41/23 (34).   Feeling better. No current resting dyspnea. As noted above, NYHA Functional Class difficult to ascertain as mobility more limited by orthopedic issues.   FH: father had CHF and valvular heart disease "leaky valve". Mother had CHF    2D Echo 08/30/22 1. Left ventricular ejection fraction, by estimation, is 25%. The left  ventricle has severely  decreased function. The left ventricle demonstrates  global hypokinesis. Left ventricular diastolic parameters are consistent  with Grade I diastolic dysfunction   (impaired relaxation).   2. Right ventricular systolic function is normal. The right ventricular  size is normal. There is moderately elevated pulmonary artery systolic  pressure.   3. Left atrial size was mildly dilated.   4. The mitral valve is  degenerative. Moderate mitral valve regurgitation.  No evidence of mitral stenosis. Moderate mitral annular calcification.   5. The aortic valve is tricuspid. There is moderate calcification of the  aortic valve. Aortic valve regurgitation is not visualized. Moderate  aortic valve stenosis. Aortic valve area, by VTI measures 1.11 cm. Aortic  valve mean gradient measures 11.0  mmHg. Aortic valve Vmax measures 2.16 m/s.   6. The inferior vena cava is dilated in size with <50% respiratory  variability, suggesting right atrial pressure of 15 mmHg.    [facility] 08/30/22   Mid LAD lesion is 65% stenosed.   Dist Cx lesion is 65% stenosed.   Ost Cx to Prox Cx lesion is 30% stenosed.   Prox RCA-2 lesion is 20% stenosed.   Prox RCA-1 lesion is 30% stenosed.   2nd Diag lesion is 40% stenosed.   RPAV lesion is 80% stenosed.   1.  Unchanged moderate multivessel disease with no acute occlusions. 2.  Fick cardiac output of 7.8 L/min and Fick cardiac index of 4.1 L/min/m with thermodilution cardiac output of 6.3 L/min and thermodilution cardiac index of 3.3 L/min/m with the following hemodynamics:             Right atrial pressure mean of 12 mmHg             Right ventricular pressure 47/12 with an end-diastolic pressure of 19 mmHg             Wedge pressure mean 24 mmHg             PA pressure 54/28 with a mean of 40 mmHg             Pulmonary vascular resistance of 2 Woods units 3.  LVEDP of 30 mmHg  Review of Systems: [y] = yes, [ ]  = no   General: Weight gain [ ] ; Weight loss [ ] ; Anorexia [ ] ; Fatigue [ ] ; Fever [ ] ; Chills [ ] ; Weakness [ ]   Cardiac: Chest pain/pressure [ ] ; Resting SOB [ ] ; Exertional SOB [ ] ; Orthopnea [ ] ; Pedal Edema [ ] ; Palpitations [ ] ; Syncope [ ] ; Presyncope [ ] ; Paroxysmal nocturnal dyspnea[ ]   Pulmonary: Cough [ ] ; Wheezing[ ] ; Hemoptysis[ ] ; Sputum [ ] ; Snoring [ ]   GI: Vomiting[ ] ; Dysphagia[ ] ; Melena[ ] ; Hematochezia [ ] ; Heartburn[ ] ; Abdominal pain [ ] ;  Constipation [ ] ; Diarrhea [ ] ; BRBPR [ ]   GU: Hematuria[ ] ; Dysuria [Y ]; Nocturia[ ]   Vascular: Pain in legs with walking [ ] ; Pain in feet with lying flat [ ] ; Non-healing sores [ ] ; Stroke [ ] ; TIA [ ] ; Slurred speech [ ] ;  Neuro: Headaches[ ] ; Vertigo[ ] ; Seizures[ ] ; Paresthesias[ ] ;Blurred vision [ ] ; Diplopia [ ] ; Vision changes [ ]   Ortho/Skin: Arthritis [ Y]; Joint pain [ Y]; Muscle pain [Y ]; Joint swelling [ ] ; Back Pain [Y ]; Rash [ ]   Psych: Depression[ ] ; Anxiety[ ]   Heme: Bleeding problems [ ] ; Clotting disorders [ ] ; Anemia [ ]   Endocrine: Diabetes [Y ]; Thyroid dysfunction[ ]   Home Medications Prior to Admission medications  Medication Sig Start Date End Date Taking? Authorizing Provider  metFORMIN (GLUCOPHAGE) 1000 MG tablet Take 1,000 mg by mouth 2 (two) times daily with a meal.   Yes [provider]  acetaminophen (TYLENOL) 650 MG CR tablet Take 650 mg by mouth daily as needed for pain.    [provider]  alendronate (FOSAMAX) 35 MG tablet Take 35 mg by mouth every Monday.    [provider]  amLODipine (NORVASC) 10 MG tablet Take 1 tablet (10 mg total) by mouth daily. 05/18/22   Perlie Gold, PA-C  atorvastatin (LIPITOR) 80 MG tablet Take 1 tablet (80 mg total) by mouth daily. 05/18/22   Perlie Gold, PA-C  brimonidine (ALPHAGAN) 0.15 % ophthalmic solution Place 1 drop into both eyes 2 (two) times daily. 08/10/19   [provider]  bupivacaine (MARCAINE) 0.5 % injection 0.5 mLs by Other route once. Pain lumbar medial branch block    [provider]  carvedilol (COREG) 12.5 MG tablet Take 1 tablet (12.5 mg total) by mouth 2 (two) times daily with a meal. 05/17/22   Perlie Gold, PA-C  cholestyramine (QUESTRAN) 4 g packet Take 1 packet (4 g total) by mouth daily. Take at least 2 hours before or after rest of the medications Patient taking differently: Take 4 g by mouth every 3 (three) days. 03/25/19   Lynann Bologna, MD   clopidogrel (PLAVIX) 75 MG tablet Take 1 tablet (75 mg total) by mouth daily. 05/18/22   Perlie Gold, PA-C  Cyanocobalamin (VITAMIN B-12 PO) Take 1 tablet by mouth daily in the afternoon.    [provider]  diclofenac Sodium (VOLTAREN) 1 % GEL Apply 2-4 g topically at bedtime as needed (knee pain).    [provider]  fenofibrate (TRICOR) 145 MG tablet Take 145 mg by mouth daily.    [provider]  ferrous sulfate 325 (65 FE) MG EC tablet Take 325 mg by mouth daily in the afternoon.    [provider]  furosemide (LASIX) 20 MG tablet Take 1 tablet (20 mg total) by mouth daily. Patient not taking: Reported on 08/30/2022 05/17/22   Perlie Gold, PA-C  gabapentin (NEURONTIN) 300 MG capsule Take 300 mg by mouth 3 (three) times daily.    [provider]  Infant Care Products [facility]) OINT Apply 1 application  topically 2 (two) times daily as needed (itching).    [provider]  irbesartan (AVAPRO) 150 MG tablet Take 1 tablet (150 mg total) by mouth daily. 05/18/22   Perlie Gold, PA-C  MAGNESIUM PO Take 1 tablet by mouth daily in the afternoon.    [provider]  Omega-3 Fatty Acids (FISH OIL PO) Take 1 capsule by mouth daily in the afternoon.    [provider]  OVER THE COUNTER MEDICATION Apply 1-4 patches topically daily as needed (back pain). Coralite pain patch    [provider]  pantoprazole (PROTONIX) 40 MG tablet Take 40 mg by mouth daily.    [provider]  pioglitazone (ACTOS) 15 MG tablet Take 15 mg by mouth daily. 01/25/20   [provider]  potassium chloride SA (KLOR-CON M) 20 MEQ tablet Take 2 tablets (40 mEq total) by mouth daily. 05/17/22   Perlie Gold, PA-C  sertraline (ZOLOFT) 50 MG tablet Take 50 mg by mouth daily.    [provider]  tiZANidine (ZANAFLEX) 2 MG tablet Take 2 mg by mouth every 6 (six) hours as needed for muscle spasms.    [provider]   TURMERIC PO Take 1 tablet by mouth daily in the afternoon.    [provider]    Past Medical History: Past Medical History:  Diagnosis Date   AKI (acute kidney injury) (HCC) 03/27/2021   Angina pectoris (HCC) 05/15/2022   Aortic stenosis 09/04/2019   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 sciatica 02/12/2022   Class 3 obesity (HCC) 03/26/2021   CVA (cerebral vascular accident) (HCC) 2015?   Cystocele with uterine prolapse 01/17/2018   Diabetes (HCC)    Dizziness 03/27/2021   Dyslipidemia 10/10/2018   Essential hypertension 10/10/2018   Fibromyalgia    GERD (gastroesophageal reflux disease)    Heart murmur 10/10/2018   History of CVA (cerebrovascular accident) 10/10/2018   Hydronephrosis of right kidney 03/26/2021   Hypercholesterolemia    Hypertension    Hypokalemia 03/27/2021   Leukocytosis    Mitral regurgitation 11/24/2018   Nonsustained ventricular tachycardia (HCC) 11/24/2018   Obesity    Post-operative nausea and vomiting    "hard to wake up"   Rectocele 01/17/2018   Sacroiliitis (HCC) 02/12/2022   Sepsis secondary to UTI (HCC) 03/26/2021   Type 2 diabetes mellitus with hyperglycemia (HCC) 03/26/2021   Type 2 diabetes mellitus without complication, without long-term current use of insulin (HCC) 10/10/2018    Past Surgical History: Past Surgical History:  Procedure Laterality Date   ABDOMINAL HYSTERECTOMY     partial, left the ovaries   APPENDECTOMY     BREAST CYST ASPIRATION Right    COLONOSCOPY  11/11/2013   Colonioc polyps status post polypectomy. Pancolonic diverticulosis predominately in the sigmoid colon   CYSTOSCOPY W/ URETERAL STENT PLACEMENT Right 03/26/2021   Procedure: CYSTOSCOPY WITH RETROGRADE PYELOGRAM/URETERAL STENT PLACEMENT;  Surgeon: Jannifer Hick, MD;  Location: [facility];  Service: Urology;  Laterality: Right;   CYSTOSCOPY/URETEROSCOPY/HOLMIUM LASER/STENT PLACEMENT Right 04/17/2021   Procedure: CYSTOSCOPY/  RETROGRADE/URETEROSCOPY/HOLMIUM LASER/STENT PLACEMENT;  Surgeon: Jannifer Hick, MD;  Location: [facility];  Service: Urology;  Laterality: Right;  ONLY NEEDS 60 MIN   DILATION AND CURETTAGE OF UTERUS     GALLBLADDER SURGERY Right 07/2014   HERNIA REPAIR  02/20/2021   Umbilical   LEFT HEART CATH AND CORONARY ANGIOGRAPHY N/A 05/16/2022   Procedure: LEFT HEART CATH AND CORONARY ANGIOGRAPHY;  Surgeon: Iran Ouch, MD;  Location: [facility];  Service: Cardiovascular;  Laterality: N/A;   RIGHT/LEFT HEART CATH AND CORONARY ANGIOGRAPHY N/A 08/30/2022   Procedure: RIGHT/LEFT HEART CATH AND CORONARY ANGIOGRAPHY;  Surgeon: Orbie Pyo, MD;  Location: [facility];  Service: Cardiovascular;  Laterality: N/A;   vaginal polyp removal      Family History: Family History  Problem Relation Age of Onset   Cancer Mother    Hypertension Mother    Stroke Mother    Diabetes Mother    Heart failure Mother    Hypertension Father    Heart disease Father    Colon cancer Neg Hx    Esophageal cancer Neg Hx    Stomach cancer Neg Hx    Rectal cancer Neg Hx    Colon polyps Neg Hx     Social History: Social History   Socioeconomic History   Marital status: Married    Spouse name: Not on file   Number of children: Not on file   Years of education: Not on file   Highest education level: Not on file  Occupational History   Not on file  Tobacco Use   Smoking status:  Never   Smokeless tobacco: Never  Vaping Use   Vaping status: Never Used  Substance and Sexual Activity   Alcohol use: Not Currently   Drug use: Never   Sexual activity: Not on file  Other Topics Concern   Not on file  Social History Narrative   Not on file   Social Determinants of Health   Financial Resource Strain: Not on file  Food Insecurity: No Food Insecurity (05/15/2022)   Hunger Vital Sign    Worried About Running Out of Food in the Last Year: Never true    Ran Out of Food in the Last Year: Never true   Transportation Needs: No Transportation Needs (05/15/2022)   PRAPARE - Administrator, Civil Service (Medical): No    Lack of Transportation (Non-Medical): No  Physical Activity: Not on file  Stress: Not on file  Social Connections: Not on file    Allergies:  Allergies  Allergen Reactions   Celebrex [Celecoxib] Itching and Swelling    Swelling throughout body Skin redness    Keflex [Cephalexin] Itching    09/2018 note: tolerated Augmentin   Relafen [Nabumetone] Itching and Swelling   Cipro [Ciprofloxacin Hcl] Hives, Itching and Rash   Flagyl [Metronidazole] Itching, Swelling and Rash    Objective:    Vital Signs:   Temp:  [97.5 F (36.4 C)-101.3 F (38.5 C)] 99.9 F (37.7 C) (08/16 2000) Pulse Rate:  [61-120] 101 (08/16 2000) Resp:  [7-31] 20 (08/16 2000) BP: (50-141)/(30-73) 135/62 (08/16 2000) SpO2:  [92 %-100 %] 96 % (08/16 2000) FiO2 (%):  [40 %] 40 % (08/16 0756) Weight:  [97.5 kg] 97.5 kg (08/16 0431) Last BM Date :  (PTA)  Weight change: Filed Weights   08/30/22 0339 08/31/22 0431  Weight: 96.6 kg 97.5 kg    Intake/Output:   Intake/Output Summary (Last 24 hours) at 08/31/2022 2221 Last data filed at 08/31/2022 1950 Gross per 24 hour  Intake 1729.57 ml  Output 2475 ml  Net -745.43 ml      Physical Exam    CVP 6-7  General:  Well appearing, laying in be. No resp difficulty HEENT: normal Neck: supple. JVP not elevated. + Rt internal jugular Swan. Carotids 2+ bilat; no bruits. No lymphadenopathy or thyromegaly appreciated. Cor: PMI nondisplaced. Regular rate & rhythm. 2/6 SEM and 2/6 MR murmur Lungs: clear Abdomen: soft, nontender, nondistended. No hepatosplenomegaly. No bruits or masses. Good bowel sounds. Extremities: no cyanosis, clubbing, rash, edema Neuro: alert & orientedx3, cranial nerves grossly intact. moves all 4 extremities w/o difficulty. Affect pleasant GU" + Foley    Telemetry   Sinus tach 100s, personally reviewed    EKG    Sinus tach 116 bpm, nonspecific IVCB, QRS 138 ms   Labs   Basic Metabolic Panel: Recent Labs  Lab 08/30/22 0322 08/30/22 0450 08/30/22 1258 08/30/22 1945 08/31/22 0412 08/31/22 1111 08/31/22 2025  NA 135  139   < > 144 140 142 138 139  K 4.7  4.7   < > 3.4* 2.7* 3.1* 4.2 3.3*  CL 106  105  --   --  105 108 107 106  CO2 15*  --   --  22 20* 20* 22  GLUCOSE 295*  300*  --   --  139* 140* 144* 113*  BUN 25*  27*  --   --  25* 30* 26* 23  CREATININE 1.07*  1.20*  --   --  1.11* 1.32* 1.08* 1.07*  CALCIUM  8.6*  --   --  8.2* 8.1* 8.0* 8.0*  MG  --   --   --  1.2* 2.8*  --  1.7   < > = values in this interval not displayed.    Liver Function Tests: Recent Labs  Lab 08/30/22 0322 08/31/22 1111 08/31/22 2025  AST 42* 35 37  ALT 28 24 23   ALKPHOS 75 57 60  BILITOT 0.7 0.8 1.2  PROT 6.3* 5.5* 5.3*  ALBUMIN 3.3* 2.7* 2.6*   No results for input(s): "LIPASE", "AMYLASE" in the last 168 hours. No results for input(s): "AMMONIA" in the last 168 hours.  CBC: Recent Labs  Lab 08/30/22 0322 08/30/22 0450 08/30/22 0824 08/30/22 1249 08/30/22 1251 08/30/22 1258 08/31/22 0412  WBC 16.2*  --   --   --   --   --  17.0*  NEUTROABS 11.7*  --   --   --   --   --   --   HGB 10.9*  12.6   < > 10.9* 10.2* 10.2* 9.9* 8.5*  HCT 36.5  37.0   < > 32.0* 30.0* 30.0* 29.0* 26.8*  MCV 89.7  --   --   --   --   --  86.2  PLT 517*  --   --   --   --   --  365   < > = values in this interval not displayed.    Cardiac Enzymes: No results for input(s): "CKTOTAL", "CKMB", "CKMBINDEX", "TROPONINI" in the last 168 hours.  BNP: BNP (last 3 results) Recent Labs    08/30/22 0322  BNP 458.3*    ProBNP (last 3 results) No results for input(s): "PROBNP" in the last 8760 hours.   CBG: Recent Labs  Lab 08/31/22 0406 08/31/22 0747 08/31/22 1146 08/31/22 1601 08/31/22 2006  GLUCAP 130* 122* 113* 116* 127*    Coagulation Studies: Recent Labs    08/30/22 0322   LABPROT 16.7*  INR 1.3*     Imaging   No results found.   Medications:     Current Medications:  [START ON 09/01/2022] atorvastatin  80 mg Oral Daily   brimonidine  1 drop Both Eyes BID   Chlorhexidine Gluconate Cloth  6 each Topical Daily   [START ON 09/01/2022] clopidogrel  75 mg Oral Daily   enoxaparin (LOVENOX) injection  40 mg Subcutaneous QHS   [START ON 09/01/2022] famotidine  20 mg Oral Daily   [START ON 09/01/2022] fenofibrate  160 mg Oral Daily   furosemide  40 mg Intravenous BID   gabapentin  100 mg Oral TID   insulin aspart  0-15 Units Subcutaneous Q4H   metolazone  2.5 mg Oral BID   pantoprazole  40 mg Oral Daily   potassium chloride  20 mEq Oral Q4H   [START ON 09/01/2022] sertraline  50 mg Oral Daily   sodium chloride flush  3 mL Intravenous Q12H    Infusions:  sodium chloride     ceFEPime (MAXIPIME) IV Stopped (08/31/22 1117)   magnesium sulfate bolus IVPB     potassium chloride        Patient Profile   77 y/o female w/ h/o medically managed CAD, mod AS, T2DM, HTN, HLD, h/o CVA, GIBs (per report sounds like AVMs s/p cauterization), fibromyalgia and severe chronic LBP from spinal stenosis limiting mobility, admitted w/ septic shock, likely source UTI. Found to have new systolic heart failure and low flow low gradient AS on echo.  Assessment/Plan  1. Shock - primarily septic shock, presumed source UTI - c/w fevers but improving w/ abx. Now off pressor support. BCx NGTD - co-ox stable, 72% - c/w abx per CCM   2. Acute Systolic Heart Failure  - new. Echo 5/24 normal EF 60-65%, GIIDD, RV normal  - Echo this admit EF 25%, diffuse HK, mod MR/AS, normal RV - Hs trop Hs trop 584>>1,948  - LHC moderate multivessel disease with no acute occlusions (unchanged from prior cath in May) - RHC RA 12, PAP 54/28(40), PCWP 24, LVEDP 30. FICK CO/CI 7.8/4.1, TD 6.3/3.3, PVR 2 WU - now off pressor support. Co-ox stable 72%.  Well diuresed, CVP 6-7 - NICM. Etiology  uncertain. Possible stress induced CM from sepsis vs ? Myocarditis given hs trop elevation vs possible infiltrative CM (aortic stenosis, spinal stenosis). Will need cMRI after swan removal (likely tomorrow). Also ? LBBB mediated though no dyssynchrony noted on echo  - will try to introduce low dose GDMT in next 24 hrs as long as BP remains stable off pressors - continue to follow co-ox and CVP  - NYHA Functional Class difficult to ascertain as mobility limited by orthopedic issues.   3. Acute Hypoxic Respiratory Failure - 2/2 shock and acute pulmonary edema - improved, now extubated   4. Aortic Stenosis  - mod calcified, mod stenosis, mG 11.0 mmHg, VTI AoA 1.11 cm2 - suspect LFLG in setting of severely reduced EF - will need to be followed/ addressed after she recovers from acute infection - cMRI per above   5. UTI - abx per CCM   6. CAD - moderate 2-vessel disease with mid LAD 65% and distal Cx 65% - managing medically, as PCI of the LAD would require long stent and jailing large D2 - no CP  - on Plavix + statin   7. Mitral Regurgitation  - mod on echo, likely functional - HF optimization per above  - will need to follow outpatient   Length of Stay: 1  Gloria Sharp 08/31/2022, 10:21 PM  Advanced Heart Failure Team Pager [phone] (M-F; 7a - 5p)  Please contact [facility] for night-coverage after hours (4p -7a ) and weekends on [email or web address]   Agree with above.   77 y/o woman as above with multiple CRFs. Recent cardiac w/u with normal EF and non-obstructive CAD.   Relative functional and independent at home despite comorbidities.   Admitted with acute respiratory failure and drop in EF to 25% on echo with concern for LFLG AS and mdoerate MR  Was intubated. Repeat cath yesterday with non obstructive CAD and high CO.   Now extubated. Denies CP or SOB.   I did swan #s personally.   RA 9 PA 53/28 (40) PCWP 26 (v waves 40-45) Thermo 7.9/4.1 SVR 813 PVR  1.2 WU  General:  Elderly obese woman lying in bed No resp difficulty HEENT: normal Neck: supple. RIJ swan. Carotids 2+ bilat; no bruits. No lymphadenopathy or thryomegaly appreciated. Cor: Regular rate & rhythm. High pitched AS with markedly reduced s2 2/6 MR Lungs: clear Abdomen: obese soft, nontender, nondistended. No hepatosplenomegaly. No bruits or masses. Good bowel sounds. Extremities: no cyanosis, clubbing, rash, 2+ edema Neuro: alert & orientedx3, cranial nerves grossly intact. moves all 4 extremities w/o difficulty. Affect pleasant  Etiology of drop in LV function unclear but has significant valvular disease by echo and on exam. High cardiac output is surprising.   Would continue IV diuresis and titration of GDMT. Likely can pull Education administrator.  Will need cMRI and TEE next week for further assessment of cardiomyopathy and valvular disease.  CRITICAL CARE Performed by: Arvilla Meres  Total critical care time: 55 minutes  Critical care time was exclusive of separately billable procedures and treating other patients.  Critical care was necessary to treat or prevent imminent or life-threatening deterioration.  Critical care was time spent personally by me (independent of midlevel providers or residents) on the following activities: development of treatment plan with patient and/or surrogate as well as nursing, discussions with consultants, evaluation of patient's response to treatment, examination of patient, obtaining history from patient or surrogate, ordering and performing treatments and interventions, ordering and review of laboratory studies, ordering and review of radiographic studies, pulse oximetry and re-evaluation of patient's condition.  Arvilla Meres, MD  10:29 PM

## 2022-08-31 NOTE — Progress Notes (Signed)
[facility] ADULT ICU REPLACEMENT PROTOCOL   The patient does apply for the [facility] Adult ICU Electrolyte Replacment Protocol based on the criteria listed below:   1.Exclusion criteria: TCTS, ECMO, Dialysis, and Myasthenia Gravis patients 2. Is GFR >/= 30 ml/min? Yes.    Patient's GFR today is 42 3. Is SCr </= 2? Yes.   Patient's SCr is 1.32 mg/dL 4. Did SCr increase >/= 0.5 in 24 hours? No. 5.Pt's weight >40kg  Yes.   6. Abnormal electrolyte(s): K+ = 3.1  7. Electrolytes replaced per protocol 8.  Call MD STAT for K+ </= 2.5, Phos </= 1, or Mag </= 1 Physician:  Warrick Parisian, MD   Faye Ramsay 08/31/2022 4:57 AM

## 2022-08-31 NOTE — Progress Notes (Addendum)
Progress Note  Patient Name: Gloria Sharp Date of Encounter: 08/31/2022  [facility] Cardiologist: Gypsy Balsam, MD     Subjective   Events of early this am noted.  Admitted with acute respiratory failure and severe back pain as well as LOC.  Initially STEMI called but cancelled. 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   Recent admission 5/1-05/17/22 for chest pain. LHC showed moderate 2-vessel disease with mid LAD 65% and distal Cx 65%. At that point, opted for medical management given PCI of the LAD would require long stent and jailing large D2. TTE showed normal EF, mild MS, moderate AS (PV = 3, AVA = 1.1, MG = 20, PG = 34, DI = 0.39) .  Has chronic LBBB.  hsTrop 584>> 1948.    Cath yesterday with 65% mLAD, 65% dLCx, 30% oLCx, 20 and 30% seq lesions pRCA, 40% D2 and 80% RPAV.  Essentially unchanged from prior cath. Medical management recommended.  RHC with CO 7.8/CI 4.1, PAP 54/86mmHg LVEDP and PCWP  Extubated and alert.  No complaints at present.   Inpatient Medications    Scheduled Meds:  atorvastatin  80 mg Per Tube Daily   Chlorhexidine Gluconate Cloth  6 each Topical Daily   clopidogrel  75 mg Per Tube Daily   docusate  100 mg Per Tube BID   famotidine  20 mg Per Tube Daily   furosemide  40 mg Intravenous BID   insulin aspart  0-15 Units Subcutaneous Q4H   ipratropium-albuterol  3 mL Nebulization Q6H   mouth rinse  15 mL Mouth Rinse Q2H   pantoprazole (PROTONIX) IV  40 mg Intravenous Q24H   polyethylene glycol  17 g Per Tube Daily   potassium chloride  20 mEq Per Tube Q4H   sodium chloride flush  3 mL Intravenous Q12H   Continuous Infusions:  sodium chloride     ceFEPime (MAXIPIME) IV Stopped (08/30/22 2333)   fentaNYL infusion INTRAVENOUS 75 mcg/hr (08/30/22 1154)   heparin 1,050 Units/hr (08/31/22 0800)   phenylephrine (NEO-SYNEPHRINE) Adult infusion 40 mcg/min (08/31/22 0800)   propofol (DIPRIVAN) infusion 5 mcg/kg/min (08/31/22 0800)    PRN Meds: sodium chloride, acetaminophen, docusate, fentaNYL, fentaNYL (SUBLIMAZE) injection, fentaNYL (SUBLIMAZE) injection, ipratropium-albuterol, ondansetron (ZOFRAN) IV, mouth rinse, polyethylene glycol, sodium chloride flush   Vital Signs    Vitals:   08/31/22 0730 08/31/22 0745 08/31/22 0800 08/31/22 0825  BP: (!) 118/52 (!) 119/57 125/60   Pulse: 71 73 87 (!) 107  Resp: (!) 23 (!) 28 15 (!) 21  Temp: 98.1 F (36.7 C) 98.2 F (36.8 C) 98.4 F (36.9 C) 98.6 F (37 C)  TempSrc:      SpO2: 100% 100% 100% 95%  Weight:      Height:        Intake/Output Summary (Last 24 hours) at 08/31/2022 1015 Last data filed at 08/31/2022 0846 Gross per 24 hour  Intake 2094 ml  Output 3795 ml  Net -1701 ml      08/31/2022    4:31 AM 08/30/2022    3:39 AM 05/24/2022    8:45 AM  Last 3 Weights  Weight (lbs) 214 lb 15.2 oz 212 lb 15.4 oz 210 lb  Weight (kg) 97.5 kg 96.6 kg 95.255 kg      Telemetry    NSR with LBBB - Personally Reviewed  ECG  No new EKG to review - Personally Reviewed  Physical Exam   GEN: Well nourished, well developed in  no acute distress HEENT: Normal NECK: No JVD; No carotid bruits LYMPHATICS: No lymphadenopathy CARDIAC:RRR, no murmurs, rubs, gallops RESPIRATORY:  Clear to auscultation without rales, wheezing or rhonchi  ABDOMEN: Soft, non-tender, non-distended MUSCULOSKELETAL:  No edema; No deformity  SKIN: Warm and dry NEUROLOGIC:  Alert and oriented x 3 PSYCHIATRIC:  Normal affect  Labs    High Sensitivity Troponin:   Recent Labs  Lab 08/30/22 0322 08/30/22 0549  TROPONINIHS 584* 1,948*      Chemistry Recent Labs  Lab 08/30/22 0322 08/30/22 0450 08/30/22 1258 08/30/22 1945 08/31/22 0412  NA 135  139   < > 144 140 142  K 4.7  4.7   < > 3.4* 2.7* 3.1*  CL 106  105  --   --  105 108  CO2 15*  --   --  22 20*  GLUCOSE 295*  300*  --   --  139* 140*  BUN 25*  27*  --   --  25* 30*  CREATININE 1.07*  1.20*  --   --  1.11* 1.32*   CALCIUM 8.6*  --   --  8.2* 8.1*  PROT 6.3*  --   --   --   --   ALBUMIN 3.3*  --   --   --   --   AST 42*  --   --   --   --   ALT 28  --   --   --   --   ALKPHOS 75  --   --   --   --   BILITOT 0.7  --   --   --   --   GFRNONAA 54*  --   --  52* 42*  ANIONGAP 14  --   --  13 14   < > = values in this interval not displayed.     Hematology Recent Labs  Lab 08/30/22 0322 08/30/22 0450 08/30/22 1251 08/30/22 1258 08/31/22 0412  WBC 16.2*  --   --   --  17.0*  RBC 4.07  --   --   --  3.11*  HGB 10.9*  12.6   < > 10.2* 9.9* 8.5*  HCT 36.5  37.0   < > 30.0* 29.0* 26.8*  MCV 89.7  --   --   --  86.2  MCH 26.8  --   --   --  27.3  MCHC 29.9*  --   --   --  31.7  RDW 14.7  --   --   --  15.1  PLT 517*  --   --   --  365   < > = values in this interval not displayed.    BNP Recent Labs  Lab 08/30/22 0322  BNP 458.3*     DDimer No results for input(s): "DDIMER" in the last 168 hours.   Radiology    EEG adult  Result Date: 08/30/2022 Charlsie Quest, MD     08/30/2022  5:10 PM Patient Name: CLAY ALVIAR MRN: [id number] Epilepsy Attending: Charlsie Quest Referring Physician/Provider: Steffanie Dunn, DO Date: 07/30/2022 Duration: 23.56 mins Patient history: 77yo F with ams getting eeg to evaluate for seizure. Level of alertness: comatose AEDs during EEG study: Propofol Technical aspects: This EEG study was done with scalp electrodes positioned according to the 10-20 International system of electrode placement. Electrical activity was reviewed with band pass filter of 1-70Hz , sensitivity of 7 uV/mm, display speed of 15mm/sec with  a 60Hz  notched filter applied as appropriate. EEG data were recorded continuously and digitally stored.  Video monitoring was available and reviewed as appropriate. Description:  EEG showed continuous generalized 3 to 6 Hz theta-delta slowing admixed with 15 to 18 Hz beta activity distributed symmetrically and diffusely.  Sharp transients were seen in  left centro-temporal region.Hyperventilation and photic stimulation were not performed.   ABNORMALITY - Continuous slow, generalized IMPRESSION: This study is suggestive of severe diffuse encephalopathy, nonspecific etiology. No seizures or definite epileptiform discharges were seen throughout the recording. Charlsie Quest   ECHOCARDIOGRAM COMPLETE  Result Date: 08/30/2022    ECHOCARDIOGRAM REPORT   Patient Name:   SHALON HALLEN Witz Date of Exam: 08/30/2022 Medical Rec #:  [id number]     Height:       60.0 in Accession #:    [id number]    Weight:       213.0 lb Date of Birth:  Mar 03, 1945     BSA:          1.917 m Patient Age:    77 years      BP:           117/58 mmHg Patient Gender: F             HR:           83 bpm. Exam Location:  Inpatient Procedure: 2D Echo, Color Doppler and Cardiac Doppler STAT ECHO Indications:    Shock  History:        Patient has prior history of Echocardiogram examinations, most                 recent 05/17/2022. Risk Factors:Hypertension, Diabetes and                 Dyslipidemia.  Sonographer:    Irving Burton Senior RDCS Referring Phys: [phone] Marquin Patino R Zahriyah Joo  Sonographer Comments: Scanned supine on artificial respirator. IMPRESSIONS  1. Left ventricular ejection fraction, by estimation, is 25%. The left ventricle has severely decreased function. The left ventricle demonstrates global hypokinesis. Left ventricular diastolic parameters are consistent with Grade I diastolic dysfunction  (impaired relaxation).  2. Right ventricular systolic function is normal. The right ventricular size is normal. There is moderately elevated pulmonary artery systolic pressure.  3. Left atrial size was mildly dilated.  4. The mitral valve is degenerative. Moderate mitral valve regurgitation. No evidence of mitral stenosis. Moderate mitral annular calcification.  5. The aortic valve is tricuspid. There is moderate calcification of the aortic valve. Aortic valve regurgitation is not visualized. Aortic valve  sclerosis/calcification is present, without any evidence of aortic stenosis.  6. The inferior vena cava is dilated in size with <50% respiratory variability, suggesting right atrial pressure of 15 mmHg. FINDINGS  Left Ventricle: Left ventricular ejection fraction, by estimation, is 25%. The left ventricle has severely decreased function. The left ventricle demonstrates global hypokinesis. The left ventricular internal cavity size was normal in size. There is no left ventricular hypertrophy. Abnormal (paradoxical) septal motion, consistent with left bundle branch block. Left ventricular diastolic parameters are consistent with Grade I diastolic dysfunction (impaired relaxation). Right Ventricle: The right ventricular size is normal. No increase in right ventricular wall thickness. Right ventricular systolic function is normal. There is moderately elevated pulmonary artery systolic pressure. The tricuspid regurgitant velocity is 2.75 m/s, and with an assumed right atrial pressure of 15 mmHg, the estimated right ventricular systolic pressure is 45.2 mmHg. Left Atrium: Left atrial size was mildly dilated. Right Atrium:  Right atrial size was normal in size. Pericardium: There is no evidence of pericardial effusion. Mitral Valve: The mitral valve is degenerative in appearance. Moderate mitral annular calcification. Moderate mitral valve regurgitation. No evidence of mitral valve stenosis. Tricuspid Valve: The tricuspid valve is normal in structure. Tricuspid valve regurgitation is mild . No evidence of tricuspid stenosis. Aortic Valve: The aortic valve is tricuspid. There is moderate calcification of the aortic valve. Aortic valve regurgitation is not visualized. Aortic valve sclerosis/calcification is present, without any evidence of aortic stenosis. Aortic valve mean gradient measures 11.0 mmHg. Aortic valve peak gradient measures 18.7 mmHg. Aortic valve area, by VTI measures 1.11 cm. Pulmonic Valve: The pulmonic valve  was normal in structure. Pulmonic valve regurgitation is not visualized. No evidence of pulmonic stenosis. Aorta: The aortic root is normal in size and structure. Venous: The inferior vena cava is dilated in size with less than 50% respiratory variability, suggesting right atrial pressure of 15 mmHg. IAS/Shunts: No atrial level shunt detected by color flow Doppler.  LEFT VENTRICLE PLAX 2D LVIDd:         4.60 cm     Diastology LVIDs:         4.00 cm     LV e' medial:    3.48 cm/s LV PW:         0.80 cm     LV E/e' medial:  24.7 LV IVS:        1.00 cm     LV e' lateral:   6.20 cm/s LVOT diam:     2.00 cm     LV E/e' lateral: 13.9 LV SV:         45 LV SV Index:   24 LVOT Area:     3.14 cm  LV Volumes (MOD) LV vol d, MOD A2C: 97.2 ml LV vol d, MOD A4C: 80.4 ml LV vol s, MOD A2C: 70.5 ml LV vol s, MOD A4C: 52.9 ml LV SV MOD A2C:     26.7 ml LV SV MOD A4C:     80.4 ml LV SV MOD BP:      26.0 ml RIGHT VENTRICLE RV S prime:     7.94 cm/s TAPSE (M-mode): 1.6 cm LEFT ATRIUM             Index        RIGHT ATRIUM           Index LA diam:        3.70 cm 1.93 cm/m   RA Area:     10.70 cm LA Vol (A2C):   60.5 ml 31.56 ml/m  RA Volume:   21.40 ml  11.16 ml/m LA Vol (A4C):   56.1 ml 29.26 ml/m LA Biplane Vol: 58.1 ml 30.31 ml/m  AORTIC VALVE AV Area (Vmax):    1.08 cm AV Area (Vmean):   1.13 cm AV Area (VTI):     1.11 cm AV Vmax:           216.00 cm/s AV Vmean:          150.000 cm/s AV VTI:            0.406 m AV Peak Grad:      18.7 mmHg AV Mean Grad:      11.0 mmHg LVOT Vmax:         74.20 cm/s LVOT Vmean:        54.100 cm/s LVOT VTI:          0.144 m LVOT/AV  VTI ratio: 0.35  AORTA Ao Root diam: 2.80 cm MITRAL VALVE                  TRICUSPID VALVE MV Area (PHT): 4.31 cm       TR Peak grad:   30.2 mmHg MV Decel Time: 176 msec       TR Vmax:        275.00 cm/s MR Peak grad:    82.1 mmHg MR Mean grad:    38.0 mmHg    SHUNTS MR Vmax:         453.00 cm/s  Systemic VTI:  0.14 m MR Vmean:        281.0 cm/s   Systemic Diam:  2.00 cm MR PISA:         1.01 cm MR PISA Eff ROA: 7 mm MR PISA Radius:  0.40 cm MV E velocity: 85.90 cm/s MV A velocity: 125.00 cm/s MV E/A ratio:  0.69 Arvilla Meres MD Electronically signed by Arvilla Meres MD Signature Date/Time: 08/30/2022/2:27:06 PM    Final    CARDIAC CATHETERIZATION  Addendum Date: 08/30/2022     Mid LAD lesion is 65% stenosed.   Dist Cx lesion is 65% stenosed.   Ost Cx to Prox Cx lesion is 30% stenosed.   Prox RCA-2 lesion is 20% stenosed.   Prox RCA-1 lesion is 30% stenosed.   2nd Diag lesion is 40% stenosed.   RPAV lesion is 80% stenosed. 1.  Unchanged moderate multivessel disease with no acute occlusions. 2.  Fick cardiac output of 7.8 L/min and Fick cardiac index of 4.1 L/min/m with thermodilution cardiac output of 6.3 L/min and thermodilution cardiac index of 3.3 L/min/m with the following hemodynamics:  Right atrial pressure mean of 12 mmHg  Right ventricular pressure 47/12 with an end-diastolic pressure of 19 mmHg  Wedge pressure mean 24 mmHg  PA pressure 54/28 with a mean of 40 mmHg  Pulmonary vascular resistance of 2 Woods units 3.  LVEDP of 30 mmHg Recommendation: Continue diuresis and goal-directed medical therapy for acute systolic heart failure.  Results reviewed with spouse.   Result Date: 08/30/2022   Mid LAD lesion is 65% stenosed.   Dist Cx lesion is 65% stenosed.   Ost Cx to Prox Cx lesion is 30% stenosed.   Prox RCA-2 lesion is 20% stenosed.   Prox RCA-1 lesion is 30% stenosed.   2nd Diag lesion is 40% stenosed.   RPAV lesion is 80% stenosed. 1.  Unchanged moderate multivessel disease with no acute occlusions. 2.  Fick cardiac output of 7.8 L/min and Fick cardiac index of 4.  1 L/min/m with thermodilution cardiac output of 6.3 L/min and thermodilution cardiac index of 3.3 L/min/m with the following hemodynamics:  Right atrial pressure mean of 12 mmHg  Right ventricular pressure 47/12 with an end-diastolic pressure of 19 mmHg  Wedge pressure mean 24 mmHg   PA pressure 54/28 with a mean of 40 mmHg  Pulmonary vascular resistance of 2 Woods units 3.  LVEDP of 30 mmHg Recommendation: Continue diuresis and goal-directed medical therapy for acute systolic heart failure.   DG Chest Portable 1 View  Result Date: 08/30/2022 CLINICAL DATA:  Central line placement EXAM: PORTABLE CHEST 1 VIEW COMPARISON:  Earlier today FINDINGS: Endotracheal tube with tip between the clavicular heads and carina. An enteric tube at least reaches the stomach. Right IJ line with tip at the SVC. Normal heart size and mediastinal contours. Kerley lines and hazy airspace opacity, reference preceding  CT. No pneumothorax. IMPRESSION: 1. No complicating features of central line and enteric tube placement. 2. Stable aeration. Electronically Signed   By: Tiburcio Pea M.D.   On: 08/30/2022 05:56   CT Angio Chest/Abd/Pel for Dissection W and/or Wo Contrast  Result Date: 08/30/2022 CLINICAL DATA:  Severe back pain before becoming unresponsive. EXAM: CT ANGIOGRAPHY CHEST, ABDOMEN AND PELVIS TECHNIQUE: Non-contrast CT of the chest was initially obtained. Multidetector CT imaging through the chest, abdomen and pelvis was performed using the standard protocol during bolus administration of intravenous contrast. Multiplanar reconstructed images and MIPs were obtained and reviewed to evaluate the vascular anatomy. RADIATION DOSE REDUCTION: This exam was performed according to the departmental dose-optimization program which includes automated exposure control, adjustment of the mA and/or kV according to patient size and/or use of iterative reconstruction technique. CONTRAST:  75mL OMNIPAQUE IOHEXOL 350 MG/ML SOLN COMPARISON:  Abdomen and pelvis CT 08/22/2022 FINDINGS: CTA CHEST FINDINGS Cardiovascular: Pre contrast imaging shows no hyperdense crescent in the wall of the thoracic aorta to suggest the presence of an acute intramural hematoma. Heart size upper normal. No substantial pericardial effusion.  Coronary artery calcification is evident. Mild atherosclerotic calcification is noted in the wall of the thoracic aorta. Mediastinum/Nodes: 2 cm right thyroid nodule. This has been evaluated on previous imaging. (ref: J Am Coll Radiol. 2015 Feb;12(2): 143-50). 11 mm short axis precarinal lymph node is upper normal for size. Upper normal hilar lymph nodes evident bilaterally. The esophagus has normal imaging features. There is no axillary lymphadenopathy. Lungs/Pleura: Endotracheal tube evident. Interlobular septal thickening noted in both lungs with mosaic ground-glass opacity in the lungs diffusely in dense consolidative airspace disease in the dependent lower lungs bilaterally. Small bilateral pleural effusions evident. Musculoskeletal: No worrisome lytic or sclerotic osseous abnormality. Review of the MIP images confirms the above findings. CTA ABDOMEN AND PELVIS FINDINGS VASCULAR Aorta: Normal caliber aorta without aneurysm, dissection, vasculitis or significant stenosis. Celiac: Patent without evidence of aneurysm, dissection, vasculitis or significant stenosis. SMA: Patent without evidence of aneurysm, dissection, vasculitis or significant stenosis. Renals: Both renal arteries are patent without evidence of aneurysm, dissection, vasculitis, fibromuscular dysplasia or significant stenosis. IMA: Patent without evidence of aneurysm, dissection, vasculitis or significant stenosis. Inflow: Patent without evidence of aneurysm, dissection, vasculitis or significant stenosis. Veins: No obvious venous abnormality within the limitations of this arterial phase study. Review of the MIP images confirms the above findings. NON-VASCULAR Hepatobiliary: The liver shows diffusely decreased attenuation suggesting fat deposition. Gallbladder is surgically absent. No intrahepatic or extrahepatic biliary dilation. Pancreas: No focal mass lesion. No dilatation of the main duct. No intraparenchymal cyst. No peripancreatic edema.  Spleen: No splenomegaly. No suspicious focal mass lesion. Adrenals/Urinary Tract: No adrenal nodule or mass. Right kidney unremarkable. 1.3 cm low-density lesion lower pole left kidney was characterized as a benign simple cyst on MRI 11/21/2020. No followup imaging is recommended. No evidence for hydroureter. Foley catheter decompresses the urinary bladder. Stomach/Bowel: NG tube tip is in the mid stomach. Duodenum is normally positioned as is the ligament of Treitz. No small bowel wall thickening. No small bowel dilatation. The terminal ileum is normal. Nonvisualization of the appendix is consistent with the reported history of appendectomy. No gross colonic mass. No colonic wall thickening. Diverticular changes are noted in the left colon without evidence of diverticulitis. Lymphatic: There is no gastrohepatic or hepatoduodenal ligament lymphadenopathy. No retroperitoneal or mesenteric lymphadenopathy. No pelvic sidewall lymphadenopathy. Reproductive: Hysterectomy.  There is no adnexal mass. Other: No intraperitoneal free fluid. Musculoskeletal:  Pelvic floor laxity evident. No worrisome lytic or sclerotic osseous abnormality. Review of the MIP images confirms the above findings. IMPRESSION: 1. No evidence for thoracic or abdominal aortic aneurysm or dissection. 2. Interlobular septal thickening with mosaic ground-glass opacity in the lungs diffusely. Imaging features suggest pulmonary edema. 3. Dense consolidative airspace disease in the dependent lower lungs bilaterally with small bilateral pleural effusions. Imaging features compatible with multifocal pneumonia. 4. Hepatic steatosis. 5. Left colonic diverticulosis without diverticulitis. 6. Pelvic floor laxity. 7.  Aortic Atherosclerosis (ICD10-I70.0). Electronically Signed   By: Kennith Center M.D.   On: 08/30/2022 05:17   CT Head Wo Contrast  Result Date: 08/30/2022 CLINICAL DATA:  Blunt poly trauma with unresponsiveness. EXAM: CT HEAD WITHOUT CONTRAST  TECHNIQUE: Contiguous axial images were obtained from the base of the skull through the vertex without intravenous contrast. RADIATION DOSE REDUCTION: This exam was performed according to the departmental dose-optimization program which includes automated exposure control, adjustment of the mA and/or kV according to patient size and/or use of iterative reconstruction technique. COMPARISON:  06/05/2021 FINDINGS: Brain: No evidence of acute infarction, hemorrhage, hydrocephalus, extra-axial collection or mass lesion/mass effect. Patchy low-density in the cerebral white matter attributed to chronic small vessel ischemia, moderately extensive. Vascular: No hyperdense vessel or unexpected calcification. Skull: Normal. Negative for fracture or focal lesion. Sinuses/Orbits: No acute finding.  Bilateral cataract resection. IMPRESSION: 1. No acute finding. 2. 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 Electronically Signed   By: Tiburcio Pea M.D.   On: 08/30/2022 04:56   DG Chest Portable 1 View  Result Date: 08/30/2022 CLINICAL DATA:  Check endotracheal tube placement EXAM: PORTABLE CHEST 1 VIEW COMPARISON:  05/15/2022 FINDINGS: Endotracheal tube is noted in satisfactory position. Cardiac shadow is stable. Aortic calcifications are again seen. Mild vascular congestion is noted. Diffuse increased density is noted throughout the right lung which may represent asymmetric edema or focal infiltrate. IMPRESSION: Endotracheal tube in satisfactory position. Vascular congestion is noted. Increased density is noted in the right lung as described. Electronically Signed   By: Alcide Clever M.D.   On: 08/30/2022 03:46    Patient Profile     77 y.o. female with a hx of aortic stenosis, DM, HTN, HL, CVA, NSVT who is being seen 08/30/2022 for the evaluation of abnormal EKG at the request of ED.   Assessment & Plan    #Acute Hypoxemic Respiratory Failure/VDRF #Acute CHF #Elevated  Troponin #Shock #CAD #HLD -unclear what inciting event was but apparently has sudden on set of SOB and became unconscious in the field and emergently intubated.  -no hx of recent fever, chills, CP or SOB prior to this -cath in May 2024 with a long 65-70% mid LAD lesion and mid Left Cx -She also has known moderate AS and mild MS by echo in May 24 -2D echo in May 24 with normal LVF -hs Troponin elevated and trend c/w NSTEMI with peak Trop 1948>>continue to follow until trending downward - repeat cath yesterday with 65% mLAD, 65% dLCx, 30% oLCx, 20 and 30% seq lesions pRCA, 40% D2 and 80% RPAV.  Essentially unchanged from prior cath.  RHC with CO 7.8/CI 4.1, PAP 54/60mmHg LVEDP and PCWP 60mmHg>>medical management of CAD and aggressive diuresis of CHF recommended -Repeat 2D echo with EF 25%, G1DD, moderate PHTN, moderate MR.  Mean AVG and AVA 1.1cm2 but DVI 0.35 and SVI low at 24 are most c/w moderate low flow low gradient AS -she is on IV Lasix  40mg  BID and put out 3.5L yesterday and is net neg 473cc since admit -has SG cath in place with last CVP 8-9 -she is tachy today that appears to be ST >> will get an EKG to confirm -change IV Heparin to SQ for DVT prophylaxis if EKG does not show atrial flutter -Repeat  Coox to make sure that tachycardia is not related to cardiogenic shock>>she was on carvedilol at home which was held on admission so could be rebound tachycardia  -continue Plavix 75mg  daily, ASA 81mg  daily and high dose statin -hold on addition of ACE I/ARB/ARNi at present due to soft BP this am>>hopefully can start low dose ARB vs. Entresto tomorrow if BP allows  #Aortic stenosis -moderate by echo 2024 with mean AVG , SVI 40, DVI 0.39 and AVA 1.1cm2 (VTI) -repeat 2D echo as above with moderate low flow low gradient AS  #Mitral Regurgitation -mild by echo 05/2022 with mild MS -no MS by echo this admit but moderate MR which will need to be followed and likely  functional from LV dysfunction  Performed by: Armanda Magic, MD   Total critical care time: 45 minutes   Critical care time was exclusive of separately billable procedures and treating other patients.   Critical care was necessary to treat or prevent imminent or life-threatening deterioration.   Critical care was time spent personally by me (independent of APPs or residents) on the following activities: development of treatment plan with patient and/or surrogate as well as nursing, discussions with consultants, evaluation of patient's response to treatment, examination of patient, obtaining history from patient or surrogate, ordering and performing treatments and interventions, ordering and review of laboratory studies, ordering and review of radiographic studies, pulse oximetry and re-evaluation of patient's condition.   For questions or updates, please contact [place] [facility] Please consult [email or web address] for contact info under        Signed, Armanda Magic, MD  08/31/2022, 10:15 AM

## 2022-08-31 NOTE — Plan of Care (Signed)

## 2022-08-31 NOTE — Progress Notes (Addendum)
NAME:  Gloria Sharp, MRN:  [id number], DOB:  1945-01-25, LOS: 1 ADMISSION DATE:  08/30/2022, CONSULTATION DATE:  08/30/22 REFERRING MD:  Nicanor Alcon- ED, CHIEF COMPLAINT:  respiratory failure   History of Present Illness:  History obtained from husband and daughter in the ED.  Gloria Sharp is a 77 y/o woman with a history of DM, HTN, FM, IBS who presented after having sudden onset SOB tonight. She had just gotten back in bed from going to the bathroom when she told her husband that she couldn't breathe and wanted him to call EMS. When the fire department got there she was unconscious and ineffectively breathing so they started BVM until EMS arrived. She remained unconscious requiring BVM all the way to the hospital. She was intubated in the ED. She has no history of blood clots or MI. She has known murmurs and 2-vessel CAD since LHC in 05/2022. She is on Plavix chronically but no other blood thinners. She has not had fever, chills, cough, or new GI symptoms different from her chronic diarrhea from her IBS. Family reports nothing like this has ever happened before. She is a non-smoker and does not drink or use drugs. She has not had recent medication changes. She went to pain clinic 3 days ago for back pain but did not get an injection or new meds.   Pertinent Medical History:  Fibromyalgia IBS HTN Mild MR, MS Mild AS by gradient DM PUD Asthma Chronic back pain, not on opiates  Significant Hospital Events: Including procedures, antibiotic start and stop dates in addition to other pertinent events   8/15 Admitted. Intubated in ED. CT Head NAICA. CTA Chest/A/P negative for dissection, +interlobular septal thickening with mosaic GGOs diffusely, dense consolidative airspace disease in BLL with small bilateral pleural effusions, c/f multifocal PNA, hepatic steatosis, L colonic diverticulosis. LIJ CVC placed. LHC/RHC Lynnette Caffey) with unchanged moderate multivessel CAD, no occlusions. 8/16 - Extubated,  tolerated well. Heparin gtt d/c per Cardiology.  Interim History / Subjective:  No significant events overnight Extubated this morning, tolerated well LHC/RHC yesterday demonstrating unchanged moderate multivessel CAD, no occlusions Heparin gtt d/c per Cards, transition to [facility] Plavix/statin/additional home meds resumed Ok to begin CLD, ADAT to regular  Objective:  Blood pressure (!) 107/45, pulse 61, temperature 98.1 F (36.7 C), resp. rate (!) 25, height 5' (1.524 m), weight 97.5 kg, SpO2 100%. PAP: (27-56)/(14-33) 35/15 CVP:  [2 mmHg-18 mmHg] 4 mmHg  Vent Mode: PRVC FiO2 (%):  [40 %-90 %] 40 % Set Rate:  [28 bmp] 28 bmp Vt Set:  [400 mL] 400 mL PEEP:  [8 cmH20-10 cmH20] 8 cmH20 Plateau Pressure:  [18 cmH20-20 cmH20] 19 cmH20   Intake/Output Summary (Last 24 hours) at 08/31/2022 0758 Last data filed at 08/31/2022 0700 Gross per 24 hour  Intake 3695.15 ml  Output 3520 ml  Net 175.15 ml   Filed Weights   08/30/22 0339 08/31/22 0431  Weight: 96.6 kg 97.5 kg   Physical Examination: General: Acutely ill-appearing elderly woman in NAD. Pleasant and conversant post-extubation. HEENT: [place]/AT, anicteric sclera, PERRL, moist mucous membranes. Neck: RIJ Swan in place. Neuro: Awake, oriented x 4. Responds to verbal stimuli. Following commands consistently. Moves all 4 extremities spontaneously. Generalized weakness. CV: Tachycardic, regular rhythm, no m/g/r. PULM: Breathing even and unlabored on 4LNC. Lung fields CTAB, diminished slightly at bases. GI: Soft, nontender, nondistended. Hypoactive bowel sounds. Extremities: Trace bilateral symmetric LE edema noted. Skin: Warm/dry, no rashes.  Resolved Hospital Problem List:   Lactic acidosis  Acute encephalopathy  Assessment & Plan:  Acute respiratory failure with hypoxia & hypercapnia Acute pulmonary edema-- history suggests flash pulmonary edema. Unsure if this was hypertensive emergency, ACS, or tachyarrhythmia. - Extubated 8/16 to  4LNC, tolerated well - Continue supplemental O2 support for SpO2 > 90% - Pulmonary hygiene (IS, OOB/mobilization) - Ongoing diuresis as tolerated - Empiric antibiotics as below  Shock - septic versus cardiogenic, improved New onset acute systolic HF Echo 1/61/0960 with EF 25%, global hypokinesis, G1DD. - Goal MAP > 65 - No longer requiring vasopressor support - Co-ox stable - AHF consulted per Cardiology  - Trend WBC, fever curve - F/u Cx data, NGTD - Continue broad-spectrum antibiotics (cefepime) - Continue to hold home antihypertensives  Troponin elevation Sinus tachycardia Echo 05/2022 EF 60-65%, no RWMAs, mild LVH, G2DD. LHC 05/2022 with known borderline LAD lesion with a long 65-70% mid LAD lesion and mid Left Cx. S/p [facility] 8/15 Lynnette Caffey) demonstrating unchanged moderate multivessel CAD, no occlusions. - Cardiology following, appreciate recs.  - Heparin gtt discontinued, ok for SQH - Continue Plavix/statin - Holding amiodarone at present - Cardiac monitoring  UTI No previous urine cultures available - F/u UA/Cx - Continue empiric cefepime  Uncontrolled hyperglycemia, DM - SSI - CBGs Q4H - Goal CBG 140-180 - Hold home metformin, pioglitazone  Chronic anemia History of PUD/?GIB - Trend H&H - Monitor for signs of active bleeding in the setting of PUD history - Transfuse for Hgb < 7.0 or hemodynamically significant bleeding  Mild transaminase elevation - Trend LFTs to normal - Supportive care  AKI on CKD stage 3a - Trend BMP - Replete electrolytes as indicated - Monitor I&Os - Avoid nephrotoxic agents as able - Ensure adequate renal perfusion  Chronic back pain - Hold home tizanidine, resume if signs of withdrawal once extubated  Best Practice (right click and "Reselect all SmartList Selections" daily)   Diet/type: NPO DVT prophylaxis: systemic heparin GI prophylaxis: PPI Lines: Central line Foley:  Yes, and it is still needed Code Status:  full  code Last date of multidisciplinary goals of care discussion [8/15 - Husband and daughter updated at bedside. They confirmed full code.]  Critical care time:   The patient is critically ill with multiple organ system failure and requires high complexity decision making for assessment and support, frequent evaluation and titration of therapies, advanced monitoring, review of radiographic studies and interpretation of complex data.   Critical Care Time devoted to patient care services, exclusive of separately billable procedures, described in this note is 32 minutes.  Tim Lair, PA-C [facility] 08/31/22 7:58 AM  Please see [email or web address] for pager details.  From 7A-7P if no response, please call [phone] After hours, please call ELink [phone]

## 2022-08-31 NOTE — Procedures (Signed)
Extubation Procedure Note  Patient Details:   Name: Gloria Sharp DOB: 1946/01/16 MRN: [id number]   Airway Documentation:    Vent end date: 08/31/22 Vent end time: 0824   Evaluation  O2 sats: stable throughout Complications: No apparent complications Patient did tolerate procedure well. Bilateral Breath Sounds: Clear, Diminished   Yes Extubated by RRT Daiva Huge.  Dewain Penning T 08/31/2022, 8:26 AM

## 2022-08-31 NOTE — Progress Notes (Signed)
ANTICOAGULATION CONSULT NOTE   Pharmacy Consult for heparin Indication: chest pain/ACS  Allergies  Allergen Reactions   Celebrex [Celecoxib] Itching and Swelling    Swelling throughout body Skin redness    Keflex [Cephalexin] Itching    09/2018 note: tolerated Augmentin   Relafen [Nabumetone] Itching and Swelling   Cipro [Ciprofloxacin Hcl] Hives, Itching and Rash   Flagyl [Metronidazole] Itching, Swelling and Rash    Patient Measurements: Height: 5' (152.4 cm) Weight: 97.5 kg (214 lb 15.2 oz) IBW/kg (Calculated) : 45.5 Heparin Dosing Weight: 68.8 kg  Vital Signs: Temp: 97.7 F (36.5 C) (08/16 0400) Temp Source: Bladder (08/16 0400) BP: 119/52 (08/16 0400) Pulse Rate: 71 (08/16 0400)  Labs: Recent Labs    08/30/22 0322 08/30/22 0450 08/30/22 0549 08/30/22 0824 08/30/22 1251 08/30/22 1258 08/30/22 1945 08/31/22 0412  HGB 10.9*  12.6   < >  --    < > 10.2* 9.9*  --  8.5*  HCT 36.5  37.0   < >  --    < > 30.0* 29.0*  --  26.8*  PLT 517*  --   --   --   --   --   --  365  APTT 31  --   --   --   --   --   --   --   LABPROT 16.7*  --   --   --   --   --   --   --   INR 1.3*  --   --   --   --   --   --   --   HEPARINUNFRC  --   --   --   --   --   --   --  <0.10*  CREATININE 1.07*  1.20*  --   --   --   --   --  1.11* 1.32*  TROPONINIHS 584*  --  1,948*  --   --   --   --   --    < > = values in this interval not displayed.    Estimated Creatinine Clearance: 38.5 mL/min (A) (by C-G formula based on SCr of 1.32 mg/dL (H)).   Medical History: Past Medical History:  Diagnosis Date   AKI (acute kidney injury) (HCC) 03/27/2021   Angina pectoris (HCC) 05/15/2022   Aortic stenosis 09/04/2019   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 sciatica 02/12/2022   Class 3 obesity (HCC) 03/26/2021   CVA (cerebral vascular accident) (HCC) 2015?   Cystocele with uterine prolapse 01/17/2018   Diabetes (HCC)    Dizziness 03/27/2021   Dyslipidemia 10/10/2018   Essential  hypertension 10/10/2018   Fibromyalgia    GERD (gastroesophageal reflux disease)    Heart murmur 10/10/2018   History of CVA (cerebrovascular accident) 10/10/2018   Hydronephrosis of right kidney 03/26/2021   Hypercholesterolemia    Hypertension    Hypokalemia 03/27/2021   Leukocytosis    Mitral regurgitation 11/24/2018   Nonsustained ventricular tachycardia (HCC) 11/24/2018   Obesity    Post-operative nausea and vomiting    "hard to wake up"   Rectocele 01/17/2018   Sacroiliitis (HCC) 02/12/2022   Sepsis secondary to UTI (HCC) 03/26/2021   Type 2 diabetes mellitus with hyperglycemia (HCC) 03/26/2021   Type 2 diabetes mellitus without complication, without long-term current use of insulin (HCC) 10/10/2018   Assessment: [name] presented to the ED with severe back pain and became unresponsive with EMS. No anticoagulants noted PTA, She is  now s/p cath  Heparin restarted post-cath- level was undetectable, on heparin infusion at 850 units/hr. Hgb 8.5, plt 365. No s/sx of bleeding or infusion issues.   Goal of Therapy:  Heparin level 0.3-0.7 units/ml Monitor platelets by anticoagulation protocol: Yes   Plan:  Increase heparin infusion to 1050 units/hr Order heparin level in 8 hours Monitor daily HL, CBC, and for s/sx of bleeding   Thank you for allowing pharmacy to participate in this patient's care,  Sherron Monday, PharmD, BCCCP Clinical Pharmacist  Phone: [phone] 08/31/2022 5:34 AM  Please check AMION for all [facility] phone numbers After 10:00 PM, call Main Pharmacy [phone]

## 2022-09-01 DIAGNOSIS — J9602 Acute respiratory failure with hypercapnia: Secondary | ICD-10-CM | POA: Diagnosis not present

## 2022-09-01 DIAGNOSIS — J9601 Acute respiratory failure with hypoxia: Secondary | ICD-10-CM | POA: Diagnosis not present

## 2022-09-01 LAB — CBC WITH DIFFERENTIAL/PLATELET
Abs Immature Granulocytes: 0.09 10*3/uL — ABNORMAL HIGH (ref 0.00–0.07)
Basophils Absolute: 0 10*3/uL (ref 0.0–0.1)
Basophils Relative: 0 %
Eosinophils Absolute: 0.5 10*3/uL (ref 0.0–0.5)
Eosinophils Relative: 4 %
HCT: 25.3 % — ABNORMAL LOW (ref 36.0–46.0)
Hemoglobin: 8.1 g/dL — ABNORMAL LOW (ref 12.0–15.0)
Immature Granulocytes: 1 %
Lymphocytes Relative: 11 %
Lymphs Abs: 1.5 10*3/uL (ref 0.7–4.0)
MCH: 27.3 pg (ref 26.0–34.0)
MCHC: 32 g/dL (ref 30.0–36.0)
MCV: 85.2 fL (ref 80.0–100.0)
Monocytes Absolute: 1.2 10*3/uL — ABNORMAL HIGH (ref 0.1–1.0)
Monocytes Relative: 9 %
Neutro Abs: 10.8 10*3/uL — ABNORMAL HIGH (ref 1.7–7.7)
Neutrophils Relative %: 75 %
Platelets: 277 10*3/uL (ref 150–400)
RBC: 2.97 MIL/uL — ABNORMAL LOW (ref 3.87–5.11)
RDW: 14.9 % (ref 11.5–15.5)
WBC: 14.1 10*3/uL — ABNORMAL HIGH (ref 4.0–10.5)
nRBC: 0 % (ref 0.0–0.2)

## 2022-09-01 LAB — GLUCOSE, CAPILLARY
Glucose-Capillary: 114 mg/dL — ABNORMAL HIGH (ref 70–99)
Glucose-Capillary: 132 mg/dL — ABNORMAL HIGH (ref 70–99)
Glucose-Capillary: 132 mg/dL — ABNORMAL HIGH (ref 70–99)
Glucose-Capillary: 141 mg/dL — ABNORMAL HIGH (ref 70–99)
Glucose-Capillary: 156 mg/dL — ABNORMAL HIGH (ref 70–99)
Glucose-Capillary: 158 mg/dL — ABNORMAL HIGH (ref 70–99)
Glucose-Capillary: 161 mg/dL — ABNORMAL HIGH (ref 70–99)

## 2022-09-01 LAB — BASIC METABOLIC PANEL
Anion gap: 6 (ref 5–15)
Anion gap: 10 (ref 5–15)
Anion gap: 15 (ref 5–15)
BUN: 21 mg/dL (ref 8–23)
BUN: 22 mg/dL (ref 8–23)
BUN: 20 mg/dL (ref 8–23)
CO2: 25 mmol/L (ref 22–32)
CO2: 25 mmol/L (ref 22–32)
CO2: 23 mmol/L (ref 22–32)
Calcium: 8.3 mg/dL — ABNORMAL LOW (ref 8.9–10.3)
Calcium: 9.1 mg/dL (ref 8.9–10.3)
Calcium: 9.1 mg/dL (ref 8.9–10.3)
Chloride: 106 mmol/L (ref 98–111)
Chloride: 102 mmol/L (ref 98–111)
Chloride: 99 mmol/L (ref 98–111)
Creatinine, Ser: 1.11 mg/dL — ABNORMAL HIGH (ref 0.44–1.00)
Creatinine, Ser: 0.94 mg/dL (ref 0.44–1.00)
Creatinine, Ser: 0.9 mg/dL (ref 0.44–1.00)
GFR, Estimated: 52 mL/min — ABNORMAL LOW (ref >60)
GFR, Estimated: >60 mL/min (ref >60)
GFR, Estimated: >60 mL/min (ref >60)
Glucose, Bld: 112 mg/dL — ABNORMAL HIGH (ref 70–99)
Glucose, Bld: 111 mg/dL — ABNORMAL HIGH (ref 70–99)
Glucose, Bld: 160 mg/dL — ABNORMAL HIGH (ref 70–99)
Potassium: 3.8 mmol/L (ref 3.5–5.1)
Potassium: 3.8 mmol/L (ref 3.5–5.1)
Potassium: 3.8 mmol/L (ref 3.5–5.1)
Sodium: 137 mmol/L (ref 135–145)
Sodium: 137 mmol/L (ref 135–145)
Sodium: 137 mmol/L (ref 135–145)

## 2022-09-01 LAB — MAGNESIUM
Magnesium: 2.2 mg/dL (ref 1.7–2.4)
Magnesium: 1.9 mg/dL (ref 1.7–2.4)

## 2022-09-01 LAB — IRON AND TIBC
Iron: 15 ug/dL — ABNORMAL LOW (ref 28–170)
Saturation Ratios: 5 % — ABNORMAL LOW (ref 10.4–31.8)
TIBC: 330 ug/dL (ref 250–450)
UIBC: 315 ug/dL

## 2022-09-01 LAB — FERRITIN: Ferritin: 79 ng/mL (ref 11–307)

## 2022-09-01 MED ORDER — FUROSEMIDE 10 MG/ML IJ SOLN
60.0000 mg | Freq: Two times a day (BID) | INTRAMUSCULAR | Status: DC
  Administered 2022-09-01 – 2022-09-02 (×3): 60 mg via INTRAVENOUS
  Filled 2022-09-01 (×3): qty 6

## 2022-09-01 MED ORDER — SACUBITRIL-VALSARTAN 24-26 MG PO TABS
1.0000 | ORAL_TABLET | Freq: Two times a day (BID) | ORAL | Status: DC
  Administered 2022-09-01 – 2022-09-05 (×8): 1 via ORAL
  Filled 2022-09-01 (×10): qty 1

## 2022-09-01 MED ORDER — POTASSIUM CHLORIDE CRYS ER 20 MEQ PO TBCR
40.0000 meq | EXTENDED_RELEASE_TABLET | Freq: Once | ORAL | Status: AC
  Administered 2022-09-01: 40 meq via ORAL
  Filled 2022-09-01: qty 2

## 2022-09-01 MED ORDER — METOLAZONE 2.5 MG PO TABS
2.5000 mg | ORAL_TABLET | Freq: Once | ORAL | Status: AC
  Administered 2022-09-01: 2.5 mg via ORAL
  Filled 2022-09-01: qty 1

## 2022-09-01 NOTE — TOC CM/SW Note (Signed)
..  Transition of Care [facility]) - Inpatient Brief Assessment   Patient Details  Name: Gloria Sharp MRN: [id number] Date of Birth: 26-Apr-1945  Transition of Care [facility]) CM/SW Contact:    Elliot Cousin, RN Phone Number: 09/01/2022, 12:05 PM   Clinical Narrative: Scheduled TEE next week. TOC CM will continue to follow for dc needs.    Transition of Care Asessment: Insurance and Status: Insurance coverage has been reviewed Patient has primary care physician: Yes     Prior/Current Home Services: No current home services Social Determinants of Health Reivew: SDOH reviewed needs interventions Readmission risk has been reviewed: Yes Transition of care needs: transition of care needs identified, TOC will continue to follow

## 2022-09-01 NOTE — Progress Notes (Signed)
NAME:  MEGEN SHECKLER, MRN:  [id number], DOB:  February 11, 1945, LOS: 2 ADMISSION DATE:  08/30/2022, CONSULTATION DATE:  08/30/22 REFERRING MD:  Nicanor Alcon- ED, CHIEF COMPLAINT:  respiratory failure   History of Present Illness:  History obtained from husband and daughter in the ED.  Ms. Fujitani is a 77 y/o woman with a history of DM, HTN, FM, IBS who presented after having sudden onset SOB tonight. She had just gotten back in bed from going to the bathroom when she told her husband that she couldn't breathe and wanted him to call EMS. When the fire department got there she was unconscious and ineffectively breathing so they started BVM until EMS arrived. She remained unconscious requiring BVM all the way to the hospital. She was intubated in the ED. She has no history of blood clots or MI. She has known murmurs and 2-vessel CAD since LHC in 05/2022. She is on Plavix chronically but no other blood thinners. She has not had fever, chills, cough, or new GI symptoms different from her chronic diarrhea from her IBS. Family reports nothing like this has ever happened before. She is a non-smoker and does not drink or use drugs. She has not had recent medication changes. She went to pain clinic 3 days ago for back pain but did not get an injection or new meds.   Pertinent Medical History:  Fibromyalgia IBS HTN Mild MR, MS Mild AS by gradient DM PUD Asthma Chronic back pain, not on opiates  Significant Hospital Events: Including procedures, antibiotic start and stop dates in addition to other pertinent events   8/15 Admitted. Intubated in ED. CT Head NAICA. CTA Chest/A/P negative for dissection, +interlobular septal thickening with mosaic GGOs diffusely, dense consolidative airspace disease in BLL with small bilateral pleural effusions, c/f multifocal PNA, hepatic steatosis, L colonic diverticulosis. LIJ CVC placed. LHC/RHC Lynnette Caffey) with unchanged moderate multivessel CAD, no occlusions. 8/16 - Extubated,  tolerated well. Heparin gtt d/c per Cardiology. 8/17 dc swan, pt ot   Interim History / Subjective:   Doing better this AM. Alert following commands   Objective:  Blood pressure 125/70, pulse 97, temperature 99.3 F (37.4 C), resp. rate 17, height 5' (1.524 m), weight 97.5 kg, SpO2 97%. PAP: (36-59)/(16-38) 47/30 CVP:  [1 mmHg-15 mmHg] 14 mmHg CO:  [8.3 L/min] 8.3 L/min CI:  [4.3 L/min/m2] 4.3 L/min/m2      Intake/Output Summary (Last 24 hours) at 09/01/2022 0840 Last data filed at 09/01/2022 0500 Gross per 24 hour  Intake 654.43 ml  Output 4285 ml  Net -3630.57 ml   Filed Weights   08/30/22 0339 08/31/22 0431  Weight: 96.6 kg 97.5 kg   Physical Examination: General: Elderly female resting comfortably in bed no acute distress HEENT: NCAT tracking appropriately Neck: Right IJ Swan in place Neuro: Alert following commands no focal deficit CV: Tachycardic, regular PULM: Clear to auscultation GI: Soft nontender nondistended Extremities: No significant edema, improved Skin: Warm/dry, no rashes.  Resolved Hospital Problem List:   Lactic acidosis Acute encephalopathy  Assessment & Plan:   Acute respiratory failure with hypoxia & hypercapnia Acute pulmonary edema-- history suggests flash pulmonary edema. Unsure if this was hypertensive emergency, ACS, or tachyarrhythmia. Plan: Continue supplemental oxygen goal SpO2 greater than 90%. Ongoing diuresis as tolerated to maintain euvolemia Continue mobility at this point  Shock - septic versus cardiogenic, improved New onset acute systolic HF Echo 9/52/8413 with EF 25%, global hypokinesis, G1DD. Plan: Off pressors Cardiac index is stable. Remove Swan Complete course  of antibiotics.  Complete 5 days cefepime  Troponin elevation Sinus tachycardia Echo 05/2022 EF 60-65%, no RWMAs, mild LVH, G2DD. LHC 05/2022 with known borderline LAD lesion with a long 65-70% mid LAD lesion and mid Left Cx. S/p [facility] 8/15 Lynnette Caffey)  demonstrating unchanged moderate multivessel CAD, no occlusions. Plan: Subcu heparin Will need outpatient cardiology management for multivessel CAD Continue statin  UTI No previous urine cultures available - F/u UA/Cx Complete 5 days cefepime  Uncontrolled hyperglycemia, DM SSI with CBGs, goal CBG 140-180  Chronic anemia History of PUD/?GIB - Trend H&H - Monitor for signs of active bleeding in the setting of PUD history - Transfuse for Hgb < 7.0 or hemodynamically significant bleeding  Mild transaminase elevation - Trend LFTs to normal - Supportive care  AKI on CKD stage 3a -Follow urine output and kidney function.  Chronic back pain - Hold home tizanidine, resume if signs of withdrawal once extubated  Best Practice (right click and "Reselect all SmartList Selections" daily)   Diet/type: NPO DVT prophylaxis: systemic heparin GI prophylaxis: PPI Lines: Central line Foley:  Yes, and it is still needed Code Status:  full code Last date of multidisciplinary goals of care discussion [family updated yesterday.  Critical care time:   Josephine Igo, DO [facility] 09/01/2022 9:03 AM

## 2022-09-01 NOTE — Evaluation (Signed)
Physical Therapy Evaluation Patient Details Name: Gloria Sharp MRN: [id number] DOB: 1945/10/29 Today's Date: 09/01/2022  History of Present Illness  Pt is 77 year old presented to [facility] 08/30/22  after having sudden onset SOB tonight. Intubated in the ED;  acute pulmonary edema, shock, encephalopathy, Troponin elevated and trend c/w NSTEMI, extubated 08/31/22, echo with EF 25%, cardiac cath  with moderate 2-vessel disease PMH - chronic/rt leg pain, DM, HTN, CVA, aortic stenosis, fibromyalgia, IBS, asthma  Clinical Impression   Pt admitted secondary to problem above with deficits below. PTA patient was walking with RW vs rollator (home vs community) due to back pain. Pt currently requires min assist for transfers and unable to progress to ambulation due to fatigue and HR up to 133 bpm. Patient has excellent support at home. She has a ramp to enter home.  Anticipate patient will benefit from PT to address problems listed below.Will continue to follow acutely to maximize functional mobility independence and safety.           If plan is discharge home, recommend the following: A little help with walking and/or transfers;A little help with bathing/dressing/bathroom;Assistance with cooking/housework;Assist for transportation;Help with stairs or ramp for entrance   Can travel by private vehicle        Equipment Recommendations None recommended by PT  Recommendations for Other Services       Functional Status Assessment Patient has had a recent decline in their functional status and demonstrates the ability to make significant improvements in function in a reasonable and predictable amount of time.     Precautions / Restrictions Precautions Precautions: Fall;Other (comment) Precaution Comments: watch HR      Mobility  Bed Mobility Overal bed mobility: Needs Assistance Bed Mobility: Supine to Sit     Supine to sit: Contact guard, Used rails, HOB elevated     General bed mobility  comments: CGA for lines    Transfers Overall transfer level: Needs assistance Equipment used: None Transfers: Sit to/from Stand, Bed to chair/wheelchair/BSC Sit to Stand: Min assist   Step pivot transfers: Min assist            Ambulation/Gait               General Gait Details: deferred due to HR up to 133 with standing transfer  Stairs            Wheelchair Mobility     Tilt Bed    Modified Rankin (Stroke Patients Only)       Balance Overall balance assessment: Mild deficits observed, not formally tested                                           Pertinent Vitals/Pain Pain Assessment Pain Assessment: No/denies pain    Home Living Family/patient expects to be discharged to:: Private residence Living Arrangements: Spouse/significant other Available Help at Discharge: Family;Available 24 hours/day Type of Home: House Home Access: Ramped entrance       Home Layout: One level Home Equipment: Agricultural consultant (2 wheels);Rollator (4 wheels);Cane - single point      Prior Function Prior Level of Function : Independent/Modified Independent             Mobility Comments: Uses rollator in community, rolling walker in the home intermittently due to back pain ADLs Comments: does not get in the shower     Extremity/Trunk  Assessment   Upper Extremity Assessment Upper Extremity Assessment: Defer to OT evaluation    Lower Extremity Assessment Lower Extremity Assessment: Generalized weakness    Cervical / Trunk Assessment Cervical / Trunk Assessment: Other exceptions Cervical / Trunk Exceptions: overweight  Communication   Communication Communication: Hearing impairment Cueing Techniques: Verbal cues;Tactile cues  Cognition Arousal: Alert Behavior During Therapy: WFL for tasks assessed/performed Overall Cognitive Status: Within Functional Limits for tasks assessed                                           General Comments General comments (skin integrity, edema, etc.): Husband present.    Exercises     Assessment/Plan    PT Assessment Patient needs continued PT services  PT Problem List Decreased strength;Decreased activity tolerance;Decreased balance;Decreased mobility;Decreased knowledge of use of DME;Cardiopulmonary status limiting activity;Obesity       PT Treatment Interventions DME instruction;Gait training;Functional mobility training;Therapeutic activities;Therapeutic exercise;Balance training;Patient/family education    PT Goals (Current goals can be found in the Care Plan section)  Acute Rehab PT Goals Patient Stated Goal: "get back to my usual self" PT Goal Formulation: With patient Time For Goal Achievement: 09/15/22 Potential to Achieve Goals: Good    Frequency Min 1X/week     Co-evaluation               AM-PAC PT "6 Clicks" Mobility  Outcome Measure Help needed turning from your back to your side while in a flat bed without using bedrails?: None Help needed moving from lying on your back to sitting on the side of a flat bed without using bedrails?: A Little Help needed moving to and from a bed to a chair (including a wheelchair)?: A Little Help needed standing up from a chair using your arms (e.g., wheelchair or bedside chair)?: A Little Help needed to walk in hospital room?: Total Help needed climbing 3-5 steps with a railing? : Total 6 Click Score: 15    End of Session Equipment Utilized During Treatment: Oxygen Activity Tolerance: Patient limited by fatigue Patient left: in chair;with call bell/phone within reach;with chair alarm set;with family/visitor present Nurse Communication: Mobility status PT Visit Diagnosis: Muscle weakness (generalized) (M62.81);Difficulty in walking, not elsewhere classified (R26.2)    Time: 1610-9604 PT Time Calculation (min) (ACUTE ONLY): 30 min   Charges:   PT Evaluation $PT Eval Low Complexity: 1 Low   PT  General Charges $$ ACUTE PT VISIT: 1 Visit          Jerolyn Center, PT Acute Rehabilitation Services  Office [phone]   Zena Amos 09/01/2022, 4:46 PM

## 2022-09-01 NOTE — Progress Notes (Signed)
Patient ID: Gloria Sharp, female   DOB: 12-01-1945, 77 y.o.   MRN: [id number]     Advanced Heart Failure Rounding Note  PCP-Cardiologist: Gypsy Balsam, MD   Subjective:    Feels tired, slept poorly.  Breathing better.   CVP 12 this morning, good diuresis yesterday with I/Os net negative 3782 cc, weight down. SBP 120s.   On cefepime for UTI  Echo: EF 25%, RV normal, moderate MR, moderate low flow/low gradient AS with AVA 1.1 cm^2, IVC dilated  [place] numbers: CVP 12 PA 46/20 CI 4.3 No co-ox   Objective:   Weight Range: 97.5 kg Body mass index is 41.98 kg/m.   Vital Signs:   Temp:  [99.1 F (37.3 C)-100.4 F (38 C)] 99.1 F (37.3 C) (08/17 0900) Pulse Rate:  [87-120] 87 (08/17 0900) Resp:  [8-29] 22 (08/17 0900) BP: (108-148)/(47-78) 129/60 (08/17 0900) SpO2:  [92 %-100 %] 97 % (08/17 0900) Last BM Date : 09/01/22  Weight change: Filed Weights   08/30/22 0339 08/31/22 0431  Weight: 96.6 kg 97.5 kg    Intake/Output:   Intake/Output Summary (Last 24 hours) at 09/01/2022 0928 Last data filed at 09/01/2022 0800 Gross per 24 hour  Intake 684.36 ml  Output 4060 ml  Net -3375.64 ml      Physical Exam    General:  Well appearing. No resp difficulty HEENT: Normal Neck: Supple. JVP 12 cm. Carotids 2+ bilat; no bruits. No lymphadenopathy or thyromegaly appreciated. Cor: PMI nondisplaced. Regular rate & rhythm. 3/6 SEM RUSB with clear S2 Lungs: Decreased at bases.  Abdomen: Soft, nontender, nondistended. No hepatosplenomegaly. No bruits or masses. Good bowel sounds. Extremities: No cyanosis, clubbing, rash, edema Neuro: Alert & orientedx3, cranial nerves grossly intact. moves all 4 extremities w/o difficulty. Affect pleasant   Telemetry   NSR 80s (personally reviewed)   Labs    CBC Recent Labs    08/30/22 0322 08/30/22 0450 08/31/22 0412 09/01/22 0511  WBC 16.2*  --  17.0* 14.1*  NEUTROABS 11.7*  --   --  10.8*  HGB 10.9*  12.6   < > 8.5* 8.1*   HCT 36.5  37.0   < > 26.8* 25.3*  MCV 89.7  --  86.2 85.2  PLT 517*  --  365 277   < > = values in this interval not displayed.   Basic Metabolic Panel Recent Labs    23/76/28 2025 09/01/22 0511  NA 139 137  K 3.3* 3.8  CL 106 106  CO2 22 25  GLUCOSE 113* 112*  BUN 23 21  CREATININE 1.07* 1.11*  CALCIUM 8.0* 8.3*  MG 1.7 2.2   Liver Function Tests Recent Labs    08/31/22 1111 08/31/22 2025  AST 35 37  ALT 24 23  ALKPHOS 57 60  BILITOT 0.8 1.2  PROT 5.5* 5.3*  ALBUMIN 2.7* 2.6*   No results for input(s): "LIPASE", "AMYLASE" in the last 72 hours. Cardiac Enzymes No results for input(s): "CKTOTAL", "CKMB", "CKMBINDEX", "TROPONINI" in the last 72 hours.  BNP: BNP (last 3 results) Recent Labs    08/30/22 0322  BNP 458.3*    ProBNP (last 3 results) No results for input(s): "PROBNP" in the last 8760 hours.   D-Dimer No results for input(s): "DDIMER" in the last 72 hours. Hemoglobin A1C No results for input(s): "HGBA1C" in the last 72 hours. Fasting Lipid Panel Recent Labs    08/31/22 0412  TRIG 255*   Thyroid Function Tests No results for input(s): "TSH", "  T4TOTAL", "T3FREE", "THYROIDAB" in the last 72 hours.  Invalid input(s): "FREET3"  Other results:   Imaging    No results found.   Medications:     Scheduled Medications:  atorvastatin  80 mg Oral Daily   brimonidine  1 drop Both Eyes BID   Chlorhexidine Gluconate Cloth  6 each Topical Daily   clopidogrel  75 mg Oral Daily   enoxaparin (LOVENOX) injection  40 mg Subcutaneous QHS   famotidine  20 mg Oral Daily   fenofibrate  160 mg Oral Daily   furosemide  60 mg Intravenous BID   gabapentin  100 mg Oral TID   insulin aspart  0-15 Units Subcutaneous Q4H   metolazone  2.5 mg Oral Once   pantoprazole  40 mg Oral Daily   potassium chloride  40 mEq Oral Once   sacubitril-valsartan  1 tablet Oral BID   sertraline  50 mg Oral Daily   sodium chloride flush  3 mL Intravenous Q12H    spironolactone  12.5 mg Oral Daily    Infusions:  sodium chloride     ceFEPime (MAXIPIME) IV Stopped (08/31/22 2329)    PRN Medications: sodium chloride, acetaminophen, diclofenac Sodium, docusate, ipratropium-albuterol, ondansetron (ZOFRAN) IV, mouth rinse, phenol, polyethylene glycol, sodium chloride flush, tiZANidine   Assessment/Plan   1. Shock: Resolved.  Thought to be primarily septic shock from UTI, on cefepime.  2. ID: UTI. No culture data.  - Continue cefepime.  3. Acute systolic CHF: Prior echo with normal EF in 5/24. Echo this admission with EF 25%, RV normal, moderate MR, moderate low flow/low gradient AS with AVA 1.1 cm^2, IVC dilated.  HS-TnI elevated to 1948 but cath did not show culprit lesion for NSTEMI.  There was moderate diffuse CAD on cath that did not explain fall in EF.  Patient has LBBB but this appears to be chronic.  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  CVP 12 this morning with good diuresis, no co-ox but CI 4.3 from [place].  Feels better.  BP stable.  - Remove swan, follow CVP and co-ox from central line.  - Would continue diuresis today with Lasix 60 mg IV bid and can give 1 dose metolazone.  - Stop losartan, start Entresto 24/26 bid.  - Continue spironolactone 12.5 daily.  - Will order cardiac MRI to assess for infiltrative disease/myocarditis.  - I think TEE would be helpful to better define her valvular disease (AS and MR), will need next week.  4. Aortic stenosis: Looks like moderate low flow/low gradient aortic stenosis but plan TEE as above to more closely evaluate.  5. Mitral regurgitation: Looks like moderate functional MR by echo but as above, TEE to more closely evaluate.  6. CAD: Moderate multivessel disease on cath this admission, does not explain fall in EF.  No culprit lesion to treat.  - Continue Plavix + statin.  7. Anemia: Check iron panel, IV Fe after cMRI if low.   CRITICAL  CARE Performed by: Marca Ancona  Total critical care time: 40 minutes  Critical care time was exclusive of separately billable procedures and treating other patients.  Critical care was necessary to treat or prevent imminent or life-threatening deterioration.  Critical care was time spent personally by me on the following activities: development of treatment plan with patient and/or surrogate as well as nursing, discussions with consultants, evaluation of patient's response to treatment, examination of patient, obtaining history from patient or surrogate, ordering and performing treatments and interventions,  ordering and review of laboratory studies, ordering and review of radiographic studies, pulse oximetry and re-evaluation of patient's condition.   Length of Stay: 2  Marca Ancona, MD  09/01/2022, 9:28 AM  Advanced Heart Failure Team Pager [phone] (M-F; 7a - 5p)  Please contact [facility] for night-coverage after hours (5p -7a ) and weekends on [email or web address]

## 2022-09-01 NOTE — Plan of Care (Signed)
  Problem: Education: Goal: Knowledge of General Education information will improve Description Including pain rating scale, medication(s)/side effects and non-pharmacologic comfort measures Outcome: Progressing   

## 2022-09-02 DIAGNOSIS — J9601 Acute respiratory failure with hypoxia: Secondary | ICD-10-CM | POA: Diagnosis not present

## 2022-09-02 DIAGNOSIS — J9602 Acute respiratory failure with hypercapnia: Secondary | ICD-10-CM | POA: Diagnosis not present

## 2022-09-02 LAB — GLUCOSE, CAPILLARY
Glucose-Capillary: 124 mg/dL — ABNORMAL HIGH (ref 70–99)
Glucose-Capillary: 113 mg/dL — ABNORMAL HIGH (ref 70–99)
Glucose-Capillary: 168 mg/dL — ABNORMAL HIGH (ref 70–99)
Glucose-Capillary: 207 mg/dL — ABNORMAL HIGH (ref 70–99)
Glucose-Capillary: 125 mg/dL — ABNORMAL HIGH (ref 70–99)
Glucose-Capillary: 127 mg/dL — ABNORMAL HIGH (ref 70–99)

## 2022-09-02 LAB — BASIC METABOLIC PANEL
Anion gap: 15 (ref 5–15)
BUN: 24 mg/dL — ABNORMAL HIGH (ref 8–23)
CO2: 26 mmol/L (ref 22–32)
Calcium: 9.3 mg/dL (ref 8.9–10.3)
Chloride: 98 mmol/L (ref 98–111)
Creatinine, Ser: 0.94 mg/dL (ref 0.44–1.00)
GFR, Estimated: >60 mL/min (ref >60)
Glucose, Bld: 125 mg/dL — ABNORMAL HIGH (ref 70–99)
Potassium: 3.5 mmol/L (ref 3.5–5.1)
Sodium: 139 mmol/L (ref 135–145)

## 2022-09-02 LAB — COOXEMETRY PANEL
Carboxyhemoglobin: 1.5 % (ref 0.5–1.5)
Methemoglobin: <0.7 % (ref 0.0–1.5)
O2 Saturation: 56.9 %
Total hemoglobin: 9.8 g/dL — ABNORMAL LOW (ref 12.0–16.0)

## 2022-09-02 MED ORDER — POTASSIUM CHLORIDE CRYS ER 20 MEQ PO TBCR
40.0000 meq | EXTENDED_RELEASE_TABLET | Freq: Once | ORAL | Status: AC
  Administered 2022-09-02: 40 meq via ORAL
  Filled 2022-09-02: qty 2

## 2022-09-02 MED ORDER — SPIRONOLACTONE 25 MG PO TABS
25.0000 mg | ORAL_TABLET | Freq: Every day | ORAL | Status: DC
  Administered 2022-09-04 – 2022-09-05 (×2): 25 mg via ORAL
  Filled 2022-09-02 (×3): qty 1

## 2022-09-02 NOTE — Progress Notes (Signed)
OT reported to RN that patient was c/o dizziness again when up working with OT. Patient in seated position in bed with OT and pt bp reading 82/41.  MD notified.

## 2022-09-02 NOTE — Progress Notes (Signed)
Patient ID: Gloria Sharp, female   DOB: [birth year], 77 y.o.   MRN: [id number]     Advanced Heart Failure Rounding Note  PCP-Cardiologist: Gypsy Balsam, MD   Subjective:    Breathing better today.  CVP 4, co-ox 57%.  Weight trending down on Lasix 60 mg IV bid. BP stable.   On cefepime for UTI  Echo: EF 25%, RV normal, moderate MR, moderate low flow/low gradient AS with AVA 1.1 cm^2, IVC dilated  Objective:   Weight Range: 93.7 kg Body mass index is 40.34 kg/m.   Vital Signs:   Temp:  [97.6 F (36.4 C)-98.1 F (36.7 C)] 97.6 F (36.4 C) (08/18 0720) Pulse Rate:  [74-97] 97 (08/18 0720) Resp:  [16-27] 18 (08/18 0720) BP: (109-145)/(47-90) 135/67 (08/18 0720) SpO2:  [91 %-97 %] 95 % (08/18 0720) Weight:  [93.7 kg] 93.7 kg (08/18 0329) Last BM Date : 09/01/22  Weight change: Filed Weights   08/30/22 0339 08/31/22 0431 09/02/22 0329  Weight: 96.6 kg 97.5 kg 93.7 kg    Intake/Output:   Intake/Output Summary (Last 24 hours) at 09/02/2022 1011 Last data filed at 09/02/2022 0800 Gross per 24 hour  Intake 679 ml  Output 1850 ml  Net -1171 ml      Physical Exam    General: NAD Neck: JVP not elevated, no thyromegaly or thyroid nodule.  Lungs: Clear to auscultation bilaterally with normal respiratory effort. CV: Nondisplaced PMI.  Heart regular S1/S2, no S3/S4, 3/6 SEM RUSB with clear S2.  No peripheral edema.   Abdomen: Soft, nontender, no hepatosplenomegaly, no distention.  Skin: Intact without lesions or rashes.  Neurologic: Alert and oriented x 3.  Psych: Normal affect. Extremities: No clubbing or cyanosis.  HEENT: Normal.    Telemetry   NSR 80s (personally reviewed)   Labs    CBC Recent Labs    08/31/22 0412 09/01/22 0511  WBC 17.0* 14.1*  NEUTROABS  --  10.8*  HGB 8.5* 8.1*  HCT 26.8* 25.3*  MCV 86.2 85.2  PLT 365 277   Basic Metabolic Panel Recent Labs    78/29/56 0511 09/01/22 1143 09/01/22 1655 09/02/22 0450  NA 137 137 137 139  K  3.8 3.8 3.8 3.5  CL 106 102 99 98  CO2 25 25 23 26   GLUCOSE 112* 111* 160* 125*  BUN 21 22 20  24*  CREATININE 1.11* 0.94 0.90 0.94  CALCIUM 8.3* 9.1 9.1 9.3  MG 2.2 1.9  --   --    Liver Function Tests Recent Labs    08/31/22 1111 08/31/22 2025  AST 35 37  ALT 24 23  ALKPHOS 57 60  BILITOT 0.8 1.2  PROT 5.5* 5.3*  ALBUMIN 2.7* 2.6*   No results for input(s): "LIPASE", "AMYLASE" in the last 72 hours. Cardiac Enzymes No results for input(s): "CKTOTAL", "CKMB", "CKMBINDEX", "TROPONINI" in the last 72 hours.  BNP: BNP (last 3 results) Recent Labs    08/30/22 0322  BNP 458.3*    ProBNP (last 3 results) No results for input(s): "PROBNP" in the last 8760 hours.   D-Dimer No results for input(s): "DDIMER" in the last 72 hours. Hemoglobin A1C No results for input(s): "HGBA1C" in the last 72 hours. Fasting Lipid Panel Recent Labs    08/31/22 0412  TRIG 255*   Thyroid Function Tests No results for input(s): "TSH", "T4TOTAL", "T3FREE", "THYROIDAB" in the last 72 hours.  Invalid input(s): "FREET3"  Other results:   Imaging    No results found.  Medications:     Scheduled Medications:  atorvastatin  80 mg Oral Daily   brimonidine  1 drop Both Eyes BID   Chlorhexidine Gluconate Cloth  6 each Topical Daily   clopidogrel  75 mg Oral Daily   enoxaparin (LOVENOX) injection  40 mg Subcutaneous QHS   famotidine  20 mg Oral Daily   fenofibrate  160 mg Oral Daily   gabapentin  100 mg Oral TID   insulin aspart  0-15 Units Subcutaneous Q4H   pantoprazole  40 mg Oral Daily   potassium chloride  40 mEq Oral Once   sacubitril-valsartan  1 tablet Oral BID   sertraline  50 mg Oral Daily   sodium chloride flush  3 mL Intravenous Q12H   [START ON 09/03/2022] spironolactone  25 mg Oral Daily    Infusions:  sodium chloride     ceFEPime (MAXIPIME) IV 2 g (09/02/22 0943)    PRN Medications: sodium chloride, acetaminophen, diclofenac Sodium, docusate,  ipratropium-albuterol, ondansetron (ZOFRAN) IV, mouth rinse, phenol, polyethylene glycol, sodium chloride flush, tiZANidine   Assessment/Plan   1. Shock: Resolved.  Thought to be primarily septic shock from UTI, on cefepime.  2. ID: UTI. No culture data.  - Continue cefepime.  3. Acute systolic CHF: Prior echo with normal EF in 5/24. Echo this admission with EF 25%, RV normal, moderate MR, moderate low flow/low gradient AS with AVA 1.1 cm^2, IVC dilated.  HS-TnI elevated to 1948 but cath did not show culprit lesion for NSTEMI.  There was moderate diffuse CAD on cath that did not explain fall in EF.  Patient has LBBB but this appears to be chronic.  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  CVP 4 this morning with good diuresis, Co-ox 57%.  Feels better.  BP stable.  - Had 1 dose of IV Lasix this morning.  Can stop Lasix today, reassess CVP in am and will likely need po diuretic.  - Continue Entresto 24/26 bid.  - Increase spironolactone to 25 mg daily.  - Eventual beta blocker.  - No Marcelline Deist yet with UTI this admission.  - Will order cardiac MRI to assess for infiltrative disease/myocarditis.  - I think TEE would be helpful to better define her valvular disease (AS and MR), will order for tomorrow.   4. Aortic stenosis: Looks like moderate low flow/low gradient aortic stenosis but plan TEE as above to more closely evaluate.  5. Mitral regurgitation: Looks like moderate functional MR by echo but as above, TEE to more closely evaluate.  6. CAD: Moderate multivessel disease on cath this admission, does not explain fall in EF.  No culprit lesion to treat.  - Continue Plavix + statin.  7. Anemia: Fe deficient with transferrin saturation 5%, IV Fe after cMRI.   Length of Stay: 3  Marca Ancona, MD  09/02/2022, 10:11 AM  Advanced Heart Failure Team Pager [phone] (M-F; 7a - 5p)  Please contact [facility] for night-coverage after hours (5p -7a ) and weekends on  [email or web address]

## 2022-09-02 NOTE — Plan of Care (Signed)

## 2022-09-02 NOTE — Evaluation (Signed)
Occupational Therapy Evaluation Patient Details Name: Gloria Sharp MRN: [id number] DOB: [birth year] Today's Date: 09/02/2022   History of Present Illness Pt is 77 year old presented to [facility] 08/30/22  after having sudden onset SOB tonight. Intubated in the ED;  acute pulmonary edema, shock, encephalopathy, Troponin elevated and trend c/w NSTEMI, extubated 08/31/22, echo with EF 25%, cardiac cath  with moderate 2-vessel disease PMH - chronic/rt leg pain, DM, HTN, CVA, aortic stenosis, fibromyalgia, IBS, asthma   Clinical Impression   At baseline, pt completes ADLs Mod I to Min assist and functional transfers/mobility with a RW or Rollator with Mod I. At baseline, pt receives assistance from her husband for IADLs as needed. Pt now presents with decreased activity tolerance, generalized B UE weakness, decreased standing balance and tolerance during functional tasks, and decreased safety and independence with ADLs and functional transfers/mobility. Pt currently demonstrates ability to complete UB ADLs with Set up to Mod assist, LB ADLs with Max assist, and functional transfers with a RW with Min assist. Pt functional level significantly affected by decreased activity tolerance and current cardiopulmonary status. Pt's HR largely in the 90s and O2 sat >/94% on 2L continuous O2 through nasal cannula throughout OT session. Pt BP in supine initially 72/46, then 82/41 in stting, 106/63 in standing, and 91/66 after returning to sit with pt reporting dizziness throughout session worse when first sitting EOB. Pt will benefit from acute skilled OT services to address deficits outlined below, decrease caregiver burden, and increase safety and independence with ADLs, functional transfers, and functional mobility. Post acute discharge, pt will benefit from continued skilled OT services in the home to maximize rehab potential.       If plan is discharge home, recommend the following: A little help with walking and/or  transfers;A lot of help with bathing/dressing/bathroom;Assistance with cooking/housework;Assistance with feeding;Assist for transportation;Help with stairs or ramp for entrance (Set up for self feeding)    Functional Status Assessment  Patient has had a recent decline in their functional status and demonstrates the ability to make significant improvements in function in a reasonable and predictable amount of time.  Equipment Recommendations  Tub/shower bench    Recommendations for Other Services       Precautions / Restrictions Precautions Precautions: Fall;Other (comment) Precaution Comments: watch HR and BP Restrictions Weight Bearing Restrictions: No      Mobility Bed Mobility Overal bed mobility: Needs Assistance Bed Mobility: Supine to Sit, Sit to Supine     Supine to sit: Contact guard, Used rails, HOB elevated Sit to supine: Min assist, Used rails        Transfers Overall transfer level: Needs assistance Equipment used: Rolling walker (2 wheels) Transfers: Sit to/from Stand, Bed to chair/wheelchair/BSC Sit to Stand: Min assist     Step pivot transfers: Min assist            Balance Overall balance assessment: Needs assistance Sitting-balance support: No upper extremity supported, Feet supported Sitting balance-Leahy Scale: Good     Standing balance support: Single extremity supported, Bilateral upper extremity supported, During functional activity, Reliant on assistive device for balance Standing balance-Leahy Scale: Fair                             ADL either performed or assessed with clinical judgement   ADL Overall ADL's : Needs assistance/impaired Eating/Feeding: Set up;Sitting (with back supported)   Grooming: Set up;Sitting (with back supported)   Upper Body  Bathing: Moderate assistance;Sitting (with back supported)   Lower Body Bathing: Maximal assistance;Cueing for compensatory techniques;Cueing for safety;Cueing for  sequencing;Bed level   Upper Body Dressing : Moderate assistance;Sitting (with back supported)   Lower Body Dressing: Maximal assistance;Cueing for safety;Cueing for compensatory techniques;Sit to/from stand   Toilet Transfer: Minimal assistance;Cueing for safety;BSC/3in1;Rolling walker (2 wheels) (step-pivot)   Toileting- Clothing Manipulation and Hygiene: Maximal assistance;Cueing for compensatory techniques;Sitting/lateral lean;Sit to/from stand         General ADL Comments: Pt functional level with ADLs significantly affected by decreased activity tolerance and current cardiopulmonary status.     Vision Baseline Vision/History: 1 Wears glasses (readers; Hx B cataracts with subsequent sx) Ability to See in Adequate Light: 0 Adequate (with glasses) Patient Visual Report: No change from baseline       Perception         Praxis         Pertinent Vitals/Pain Pain Assessment Pain Assessment: Faces Faces Pain Scale: Hurts little more Pain Location: back in standing Pain Descriptors / Indicators: Discomfort, Grimacing Pain Intervention(s): Limited activity within patient's tolerance, Monitored during session, Repositioned     Extremity/Trunk Assessment Upper Extremity Assessment Upper Extremity Assessment: Generalized weakness;Right hand dominant;RUE deficits/detail;LUE deficits/detail RUE Deficits / Details: generalized weakness; decreased R shoulder ROM at baseline secondary to shoulder injury approx. 20 years ago LUE Deficits / Details: generalizied weakness   Lower Extremity Assessment Lower Extremity Assessment: Defer to PT evaluation   Cervical / Trunk Assessment Cervical / Trunk Assessment: Other exceptions Cervical / Trunk Exceptions: increased body habitus   Communication Communication Communication: Hearing impairment Cueing Techniques: Verbal cues;Gestural cues;Tactile cues   Cognition Arousal: Alert Behavior During Therapy: WFL for tasks  assessed/performed Overall Cognitive Status: Within Functional Limits for tasks assessed                                 General Comments: Pt AAOx4 and pleasant throughout session. Pt with cognition largely [facility] with noted short term memory deficits at baseline. Pt requiring occasional cues for sequencing novel tasks.     General Comments  Pt's husband present throughout session. RN present during a portion of session. Pt's HR largely in the 90s and O2 sat >/94% on 2L continuous O2 through nasal cannula throughout the session. Pt BP in supine was 72/46, 82/41 in stting, 106/63 in standing, and 91/66 after retruning to sit. Pt reporting dizziness and feeling like her "head was swimming" throughout session and mildly increased when first transitioning to sitting.    Exercises     Shoulder Instructions      Home Living Family/patient expects to be discharged to:: Private residence Living Arrangements: Spouse/significant other Available Help at Discharge: Family;Available 24 hours/day Type of Home: House Home Access: Ramped entrance     Home Layout: One level     Bathroom Shower/Tub: Chief Strategy Officer: Handicapped height     Home Equipment: Agricultural consultant (2 wheels);Rollator (4 wheels);Cane - single point;Shower seat;Adaptive equipment;Grab bars - tub/shower Adaptive Equipment: Reacher;Sock aid;Long-handled shoe horn        Prior Functioning/Environment Prior Level of Function : Needs assist             Mobility Comments: Pt perfroms funcitonal mobility Independent to Mod I with a Rollator in community and RW in the home intermittently due to back pain. ADLs Comments: At baseline pt requires Set up to [facility] assist for dressing and  bathing, and completes grooming and toileting Mod I. Pt typically does not get in the shower unless washing her hair. Husband assists with IADLs.        OT Problem List: Decreased strength;Decreased activity  tolerance;Impaired balance (sitting and/or standing);Cardiopulmonary status limiting activity      OT Treatment/Interventions: Self-care/ADL training;Therapeutic exercise;Energy conservation;DME and/or AE instruction;Therapeutic activities;Patient/family education;Balance training    OT Goals(Current goals can be found in the care plan section) Acute Rehab OT Goals Patient Stated Goal: To get stronger and have more energy so she can do things for herself OT Goal Formulation: With patient/family Time For Goal Achievement: 09/16/22 Potential to Achieve Goals: Good ADL Goals Pt Will Perform Lower Body Bathing: with min assist;with adaptive equipment;sit to/from stand Pt Will Perform Lower Body Dressing: with contact guard assist;with adaptive equipment;sit to/from stand Pt Will Transfer to Toilet: with modified independence;ambulating;grab bars (comfort height toilet) Pt Will Perform Toileting - Clothing Manipulation and hygiene: with contact guard assist;sit to/from stand Pt/caregiver will Perform Home Exercise Program: Increased strength;Both right and left upper extremity;With theraband;With theraputty;With Supervision;With written HEP provided (Increased activity tolerance) Additional ADL Goal #1: Patient will demonstrate ability to Independently state 4 energy conservation techniques to increase safety and independence with functional tasks in the home.  OT Frequency: Min 1X/week    Co-evaluation              AM-PAC OT "6 Clicks" Daily Activity     Outcome Measure Help from another person eating meals?: A Little Help from another person taking care of personal grooming?: A Little Help from another person toileting, which includes using toliet, bedpan, or urinal?: A Lot Help from another person bathing (including washing, rinsing, drying)?: A Lot Help from another person to put on and taking off regular upper body clothing?: A Lot Help from another person to put on and taking off  regular lower body clothing?: A Lot 6 Click Score: 14   End of Session Equipment Utilized During Treatment: Rolling walker (2 wheels);Gait belt;Oxygen Nurse Communication: Mobility status;Other (comment) (BP readings)  Activity Tolerance: Patient tolerated treatment well;Treatment limited secondary to medical complications (Comment) (limited by BP) Patient left: in bed;with call bell/phone within reach;with family/visitor present  OT Visit Diagnosis: Muscle weakness (generalized) (M62.81);Other (comment);Other abnormalities of gait and mobility (R26.89) (Decreased activity tolerance)                Time: 6213-0865 OT Time Calculation (min): 43 min Charges:  OT General Charges $OT Visit: 1 Visit OT Evaluation $OT Eval Low Complexity: 1 Low OT Treatments $Self Care/Home Management : 8-22 mins  Tiah Heckel "Orson Eva., OTR/L, MA Acute Rehab [phone]   Lendon Colonel 09/02/2022, 6:32 PM

## 2022-09-02 NOTE — Progress Notes (Signed)
PROGRESS NOTE  Gloria Sharp ZOX:[id number] DOB: 10-15-1945 DOA: 08/30/2022 PCP: Abner Greenspan, MD   LOS: 3 days   Brief Narrative / Interim history: 77 y/o woman with a history of DM, HTN, FM, IBS who presented after having sudden onset SOB tonight. She had just gotten back in bed from going to the bathroom when she told her husband that she couldn't breathe and wanted him to call EMS. When the fire department got there she was unconscious and ineffectively breathing so they started BVM until EMS arrived. She remained unconscious requiring BVM all the way to the hospital. She was intubated in the ED. She has no history of blood clots or MI. She has known murmurs and 2-vessel CAD since LHC in 05/2022. She is on Plavix chronically but no other blood thinners.   Subjective / 24h Interval events: She is feeling better this morning.  She denies any chest pain.  Assesement and Plan: Principal Problem:   Acute respiratory failure with hypoxia and hypercapnia (HCC) Active Problems:   Syncope and collapse   Respiratory arrest [facility])  Principal problem Presumed septic shock in the setting of UTI -initially admitted to the ICU, now off pressors and transferred to the hospitalist service 8/18.  She is to complete 5 days of cefepime  Active problems Acute hypoxic and hypercarbic respiratory failure in the setting of acute pulmonary edema due to acute systolic CHF -2D echo done on 8/11 shows an EF of 25%, global hypokinesis, grade 1 diastolic dysfunction.  CHF team consulted and following -Diuresed well with furosemide.  Obtain cardiac MRI and TEE per cardiology  CAD -most recent cardiac cath May 2024 with known borderline LAD lesion with a long 65-70% mid LAD lesion and mid left circumflex.  Medical management -Troponin elevation noted  Elevated LFTs -in the setting of #1, now normalized  Hypokalemia-replete and continue to monitor  AKI on CKD 3A -creatinine returned to baseline  Normocytic  anemia-monitor hemoglobin  Scheduled Meds:  atorvastatin  80 mg Oral Daily   brimonidine  1 drop Both Eyes BID   Chlorhexidine Gluconate Cloth  6 each Topical Daily   clopidogrel  75 mg Oral Daily   enoxaparin (LOVENOX) injection  40 mg Subcutaneous QHS   famotidine  20 mg Oral Daily   fenofibrate  160 mg Oral Daily   gabapentin  100 mg Oral TID   insulin aspart  0-15 Units Subcutaneous Q4H   pantoprazole  40 mg Oral Daily   potassium chloride  40 mEq Oral Once   sacubitril-valsartan  1 tablet Oral BID   sertraline  50 mg Oral Daily   sodium chloride flush  3 mL Intravenous Q12H   [START ON 09/03/2022] spironolactone  25 mg Oral Daily   Continuous Infusions:  sodium chloride     ceFEPime (MAXIPIME) IV 2 g (09/02/22 0943)   PRN Meds:.sodium chloride, acetaminophen, diclofenac Sodium, docusate, ipratropium-albuterol, ondansetron (ZOFRAN) IV, mouth rinse, phenol, polyethylene glycol, sodium chloride flush, tiZANidine  Current Outpatient Medications  Medication Instructions   acetaminophen (TYLENOL) 650 mg, Oral, Daily PRN   alendronate (FOSAMAX) 35 mg, Oral, Every 7 days   amLODipine (NORVASC) 10 mg, Oral, Daily   atorvastatin (LIPITOR) 80 mg, Oral, Daily   brimonidine (ALPHAGAN) 0.15 % ophthalmic solution 1 drop, Both Eyes, 2 times daily   carvedilol (COREG) 12.5 mg, Oral, 2 times daily with meals   cholestyramine (QUESTRAN) 4 g, Oral, Daily, Take at least 2 hours before or after rest of the medications  clopidogrel (PLAVIX) 75 mg, Oral, Daily   Cyanocobalamin (VITAMIN B-12 PO) 1 tablet, Oral, Daily   diclofenac Sodium (VOLTAREN) 2 g, Topical, At bedtime PRN   fenofibrate (TRICOR) 145 mg, Oral, Daily   ferrous sulfate 325 mg, Oral, Daily   furosemide (LASIX) 20 mg, Oral, Daily   gabapentin (NEURONTIN) 300 mg, Oral, 3 times daily   Infant Care Products (DERMACLOUD) OINT 1 application , Topical, 2 times daily PRN   irbesartan (AVAPRO) 150 mg, Oral, Daily   MAGNESIUM PO 1  tablet, Oral, Daily   metFORMIN (GLUCOPHAGE) 1,000 mg, Oral, 2 times daily with meals   Omega-3 Fatty Acids (FISH OIL PO) 1 capsule, Oral, Daily   OVER THE COUNTER MEDICATION 1 patch, Topical, Daily PRN, [place] pain patch   pantoprazole (PROTONIX) 40 mg, Oral, Daily   potassium chloride SA (KLOR-CON M) 20 MEQ tablet 40 mEq, Oral, Daily   sertraline (ZOLOFT) 50 mg, Oral, Daily   tiZANidine (ZANAFLEX) 2 mg, Oral, Every 6 hours PRN   TURMERIC PO 1 tablet, Oral, Daily    Diet Orders (From admission, onward)     Start     Ordered   09/03/22 0001  Diet NPO time specified Except for: Sips with Meds  Diet effective midnight       Question:  Except for  Answer:  Sips with Meds   09/02/22 1202   09/01/22 1001  Diet heart healthy/carb modified Room service appropriate? Yes; Fluid consistency: Thin  Diet effective now       Question Answer Comment  Diet-HS Snack? Nothing   Room service appropriate? Yes   Fluid consistency: Thin      09/01/22 1000            DVT prophylaxis: enoxaparin (LOVENOX) injection 40 mg Start: 08/31/22 2200 SCDs Start: 08/30/22 0507   Lab Results  Component Value Date   PLT 277 09/01/2022      Code Status: Full Code  Family Communication: no family at bedside   Status is: Inpatient Remains inpatient appropriate because: severity of illness  Level of care: Progressive  Consultants:  Cardiology  PCCM  Objective: Vitals:   09/02/22 0329 09/02/22 0400 09/02/22 0720 09/02/22 1038  BP: (!) 124/59 (!) 140/67 135/67 (!) 101/55  Pulse: 76 74 97 89  Resp: 20 19 18  (!) 25  Temp: 97.8 F (36.6 C)  97.6 F (36.4 C) 97.8 F (36.6 C)  TempSrc: Oral  Oral Oral  SpO2: 97% 96% 95% 95%  Weight: 93.7 kg     Height:        Intake/Output Summary (Last 24 hours) at 09/02/2022 1300 Last data filed at 09/02/2022 0800 Gross per 24 hour  Intake 679 ml  Output 750 ml  Net -71 ml   Wt Readings from Last 3 Encounters:  09/02/22 93.7 kg  05/24/22 95.3 kg   05/15/22 96.3 kg    Examination:  Constitutional: NAD Eyes: no scleral icterus ENMT: Mucous membranes are moist.  Neck: normal, supple Respiratory: clear to auscultation bilaterally, no wheezing, no crackles.  Cardiovascular: Regular rate and rhythm Abdomen: non distended, no tenderness. Bowel sounds positive.  Musculoskeletal: no clubbing / cyanosis.   Data Reviewed: I have independently reviewed following labs and imaging studies   CBC Recent Labs  Lab 08/30/22 0322 08/30/22 0450 08/30/22 1249 08/30/22 1251 08/30/22 1258 08/31/22 0412 09/01/22 0511  WBC 16.2*  --   --   --   --  17.0* 14.1*  HGB 10.9*  12.6   < >  10.2* 10.2* 9.9* 8.5* 8.1*  HCT 36.5  37.0   < > 30.0* 30.0* 29.0* 26.8* 25.3*  PLT 517*  --   --   --   --  365 277  MCV 89.7  --   --   --   --  86.2 85.2  MCH 26.8  --   --   --   --  27.3 27.3  MCHC 29.9*  --   --   --   --  31.7 32.0  RDW 14.7  --   --   --   --  15.1 14.9  LYMPHSABS 3.0  --   --   --   --   --  1.5  MONOABS 1.2*  --   --   --   --   --  1.2*  EOSABS 0.2  --   --   --   --   --  0.5  BASOSABS 0.1  --   --   --   --   --  0.0   < > = values in this interval not displayed.    Recent Labs  Lab 08/30/22 0322 08/30/22 0324 08/30/22 0450 08/30/22 0828 08/30/22 1249 08/30/22 1945 08/31/22 0412 08/31/22 1111 08/31/22 2025 09/01/22 0511 09/01/22 1143 09/01/22 1655 09/02/22 0450  NA 135  139  --    < >  --    < > 140 142 138 139 137 137 137 139  K 4.7  4.7  --    < >  --    < > 2.7* 3.1* 4.2 3.3* 3.8 3.8 3.8 3.5  CL 106  105  --   --   --   --  105 108 107 106 106 102 99 98  CO2 15*  --   --   --   --  22 20* 20* 22 25 25 23 26   GLUCOSE 295*  300*  --   --   --   --  139* 140* 144* 113* 112* 111* 160* 125*  BUN 25*  27*  --   --   --   --  25* 30* 26* 23 21 22 20  24*  CREATININE 1.07*  1.20*  --   --   --   --  1.11* 1.32* 1.08* 1.07* 1.11* 0.94 0.90 0.94  CALCIUM 8.6*  --   --   --   --  8.2* 8.1* 8.0* 8.0* 8.3* 9.1 9.1  9.3  AST 42*  --   --   --   --   --   --  35 37  --   --   --   --   ALT 28  --   --   --   --   --   --  24 23  --   --   --   --   ALKPHOS 75  --   --   --   --   --   --  57 60  --   --   --   --   BILITOT 0.7  --   --   --   --   --   --  0.8 1.2  --   --   --   --   ALBUMIN 3.3*  --   --   --   --   --   --  2.7* 2.6*  --   --   --   --  MG  --   --   --   --   --  1.2* 2.8*  --  1.7 2.2 1.9  --   --   LATICACIDVEN  --  4.0*  --  1.1  --   --   --   --   --   --   --   --   --   INR 1.3*  --   --   --   --   --   --   --   --   --   --   --   --   BNP 458.3*  --   --   --   --   --   --   --   --   --   --   --   --    < > = values in this interval not displayed.    ------------------------------------------------------------------------------------------------------------------ Recent Labs    08/31/22 0412  TRIG 255*    Lab Results  Component Value Date   HGBA1C 5.9 (H) 05/15/2022   ------------------------------------------------------------------------------------------------------------------ No results for input(s): "TSH", "T4TOTAL", "T3FREE", "THYROIDAB" in the last 72 hours.  Invalid input(s): "FREET3"  Cardiac Enzymes No results for input(s): "CKMB", "TROPONINI", "MYOGLOBIN" in the last 168 hours.  Invalid input(s): "CK" ------------------------------------------------------------------------------------------------------------------    Component Value Date/Time   BNP 458.3 (H) 08/30/2022 0322    CBG: Recent Labs  Lab 09/01/22 1930 09/01/22 2333 09/02/22 0327 09/02/22 0719 09/02/22 1037  GLUCAP 158* 132* 124* 113* 168*    Recent Results (from the past 240 hour(s))  Blood culture (routine x 2)     Status: None (Preliminary result)   Collection Time: 08/30/22  3:18 AM   Specimen: BLOOD RIGHT HAND  Result Value Ref Range Status   Specimen Description BLOOD RIGHT HAND  Final   Special Requests   Final    BOTTLES DRAWN AEROBIC AND ANAEROBIC Blood  Culture adequate volume   Culture   Final    NO GROWTH 3 DAYS Performed at [facility], [address], [place], [place] [address]    Report Status PENDING  Incomplete  Blood culture (routine x 2)     Status: None (Preliminary result)   Collection Time: 08/30/22  3:22 AM   Specimen: BLOOD  Result Value Ref Range Status   Specimen Description BLOOD RIGHT ANTECUBITAL  Final   Special Requests   Final    BOTTLES DRAWN AEROBIC AND ANAEROBIC Blood Culture adequate volume   Culture   Final    NO GROWTH 3 DAYS Performed at [facility], [address] [place], [place] [address]    Report Status PENDING  Incomplete  Resp panel by RT-PCR (RSV, Flu A&B, Covid) Anterior Nasal Swab     Status: None   Collection Time: 08/30/22  5:00 AM   Specimen: Anterior Nasal Swab  Result Value Ref Range Status   SARS Coronavirus 2 by RT PCR NEGATIVE NEGATIVE Final   Influenza A by PCR NEGATIVE NEGATIVE Final   Influenza B by PCR NEGATIVE NEGATIVE Final    Comment: (NOTE) The Xpert Xpress SARS-CoV-2/FLU/RSV plus assay is intended as an aid in the diagnosis of influenza from Nasopharyngeal swab specimens and should not be used as a sole basis for treatment. Nasal washings and aspirates are unacceptable for Xpert Xpress SARS-CoV-2/FLU/RSV testing.  Fact Sheet for Patients: BloggerCourse.com  Fact Sheet for Healthcare Providers: SeriousBroker.it  This test is not yet approved or cleared  by the [place] and has been authorized for detection and/or diagnosis of SARS-CoV-2 by FDA under an Emergency Use Authorization (EUA). This EUA will remain in effect (meaning this test can be used) for the duration of the COVID-19 declaration under Section 564(b)(1) of the Act, 21 U.S.C. section 360bbb-3(b)(1), unless the authorization is terminated or revoked.     Resp Syncytial Virus by PCR NEGATIVE NEGATIVE Final    Comment: (NOTE) Fact  Sheet for Patients: BloggerCourse.com  Fact Sheet for Healthcare Providers: SeriousBroker.it  This test is not yet approved or cleared by the [place] FDA and has been authorized for detection and/or diagnosis of SARS-CoV-2 by FDA under an Emergency Use Authorization (EUA). This EUA will remain in effect (meaning this test can be used) for the duration of the COVID-19 declaration under Section 564(b)(1) of the Act, 21 U.S.C. section 360bbb-3(b)(1), unless the authorization is terminated or revoked.  Performed at [facility], [address], [place], [place] [address]   MRSA Next Gen by PCR, Nasal     Status: None   Collection Time: 08/30/22  6:30 AM   Specimen: Nasal Mucosa; Nasal Swab  Result Value Ref Range Status   MRSA by PCR Next Gen NOT DETECTED NOT DETECTED Final    Comment: (NOTE) The GeneXpert MRSA Assay (FDA approved for NASAL specimens only), is one component of a comprehensive MRSA colonization surveillance program. It is not intended to diagnose MRSA infection nor to guide or monitor treatment for MRSA infections. Test performance is not FDA approved in patients less than 39 years old. Performed at [facility], [address], [place], [place] [address]      Radiology Studies: No results found.   Pamella Pert, MD, PhD [facility]  Between 7 am - 7 pm I am available, please contact me via Amion (for emergencies) or Securechat (non urgent messages)  Between 7 pm - 7 am I am not available, please contact night coverage MD/APP via Amion

## 2022-09-02 NOTE — Progress Notes (Addendum)
Patient up sitting in chair for about an hour when needed to use bedside commode. Transferred to bedside commode and patient c/o dizziness and having difficulty keeping eyes open. Patient transferred back to bed safely and still c/o dizziness and nausea. VSS. MD notified.After a few minutes patient more alert and dizziness decreasing.  RN administered PRN Zofran.

## 2022-09-03 ENCOUNTER — Inpatient Hospital Stay (HOSPITAL_COMMUNITY): Payer: Medicare PPO

## 2022-09-03 ENCOUNTER — Other Ambulatory Visit: Payer: Self-pay

## 2022-09-03 ENCOUNTER — Inpatient Hospital Stay (HOSPITAL_COMMUNITY): Payer: Medicare PPO | Admitting: Certified Registered"

## 2022-09-03 ENCOUNTER — Encounter (HOSPITAL_COMMUNITY)
Admission: EM | Disposition: A | Discharge status: Home Health | Payer: Self-pay | Source: Home / Self Care | Attending: Internal Medicine

## 2022-09-03 DIAGNOSIS — E119 Type 2 diabetes mellitus without complications: Secondary | ICD-10-CM

## 2022-09-03 DIAGNOSIS — I10 Essential (primary) hypertension: Secondary | ICD-10-CM | POA: Diagnosis not present

## 2022-09-03 DIAGNOSIS — I350 Nonrheumatic aortic (valve) stenosis: Secondary | ICD-10-CM

## 2022-09-03 DIAGNOSIS — J9601 Acute respiratory failure with hypoxia: Secondary | ICD-10-CM | POA: Diagnosis not present

## 2022-09-03 DIAGNOSIS — J9602 Acute respiratory failure with hypercapnia: Secondary | ICD-10-CM | POA: Diagnosis not present

## 2022-09-03 DIAGNOSIS — I2510 Atherosclerotic heart disease of native coronary artery without angina pectoris: Secondary | ICD-10-CM | POA: Diagnosis not present

## 2022-09-03 DIAGNOSIS — I5021 Acute systolic (congestive) heart failure: Secondary | ICD-10-CM

## 2022-09-03 HISTORY — PX: TEE WITHOUT CARDIOVERSION: SHX5443

## 2022-09-03 LAB — COMPREHENSIVE METABOLIC PANEL
ALT: 34 U/L (ref 0–44)
AST: 40 U/L (ref 15–41)
Albumin: 2.6 g/dL — ABNORMAL LOW (ref 3.5–5.0)
Alkaline Phosphatase: 63 U/L (ref 38–126)
Anion gap: 12 (ref 5–15)
BUN: 37 mg/dL — ABNORMAL HIGH (ref 8–23)
CO2: 25 mmol/L (ref 22–32)
Calcium: 8.4 mg/dL — ABNORMAL LOW (ref 8.9–10.3)
Chloride: 99 mmol/L (ref 98–111)
Creatinine, Ser: 1.29 mg/dL — ABNORMAL HIGH (ref 0.44–1.00)
GFR, Estimated: 43 mL/min — ABNORMAL LOW (ref >60)
Glucose, Bld: 142 mg/dL — ABNORMAL HIGH (ref 70–99)
Potassium: 3.6 mmol/L (ref 3.5–5.1)
Sodium: 136 mmol/L (ref 135–145)
Total Bilirubin: 0.5 mg/dL (ref 0.3–1.2)
Total Protein: 5.5 g/dL — ABNORMAL LOW (ref 6.5–8.1)

## 2022-09-03 LAB — CBC
HCT: 29.8 % — ABNORMAL LOW (ref 36.0–46.0)
Hemoglobin: 9.4 g/dL — ABNORMAL LOW (ref 12.0–15.0)
MCH: 26.9 pg (ref 26.0–34.0)
MCHC: 31.5 g/dL (ref 30.0–36.0)
MCV: 85.1 fL (ref 80.0–100.0)
Platelets: 343 10*3/uL (ref 150–400)
RBC: 3.5 MIL/uL — ABNORMAL LOW (ref 3.87–5.11)
RDW: 14.5 % (ref 11.5–15.5)
WBC: 11.3 10*3/uL — ABNORMAL HIGH (ref 4.0–10.5)
nRBC: 0 % (ref 0.0–0.2)

## 2022-09-03 LAB — MAGNESIUM: Magnesium: 1.6 mg/dL — ABNORMAL LOW (ref 1.7–2.4)

## 2022-09-03 LAB — COOXEMETRY PANEL
Carboxyhemoglobin: 1 % (ref 0.5–1.5)
Methemoglobin: 1.8 % — ABNORMAL HIGH (ref 0.0–1.5)
O2 Saturation: 62.9 %
Total hemoglobin: 10.1 g/dL — ABNORMAL LOW (ref 12.0–16.0)

## 2022-09-03 LAB — GLUCOSE, CAPILLARY
Glucose-Capillary: 149 mg/dL — ABNORMAL HIGH (ref 70–99)
Glucose-Capillary: 124 mg/dL — ABNORMAL HIGH (ref 70–99)
Glucose-Capillary: 132 mg/dL — ABNORMAL HIGH (ref 70–99)
Glucose-Capillary: 170 mg/dL — ABNORMAL HIGH (ref 70–99)
Glucose-Capillary: 144 mg/dL — ABNORMAL HIGH (ref 70–99)

## 2022-09-03 LAB — TRIGLYCERIDES: Triglycerides: 189 mg/dL — ABNORMAL HIGH (ref <150)

## 2022-09-03 SURGERY — ECHOCARDIOGRAM, TRANSESOPHAGEAL
Anesthesia: Monitor Anesthesia Care

## 2022-09-03 MED ORDER — SODIUM CHLORIDE 0.9 % IV SOLN: INTRAVENOUS | Status: DC

## 2022-09-03 MED ORDER — MAGNESIUM SULFATE 2 GM/50ML IV SOLN
2.0000 g | Freq: Once | INTRAVENOUS | Status: AC
  Administered 2022-09-03: 2 g via INTRAVENOUS
  Filled 2022-09-03: qty 50

## 2022-09-03 MED ORDER — GADOBUTROL 1 MMOL/ML IV SOLN
10.0000 mL | Freq: Once | INTRAVENOUS | Status: AC | PRN
  Administered 2022-09-03: 10 mL via INTRAVENOUS

## 2022-09-03 MED ORDER — PROPOFOL 500 MG/50ML IV EMUL
INTRAVENOUS | Status: DC | PRN
  Administered 2022-09-03: 125 ug/kg/min via INTRAVENOUS
  Administered 2022-09-03 (×2): 20 mg via INTRAVENOUS

## 2022-09-03 MED ORDER — INSULIN ASPART 100 UNIT/ML IJ SOLN: 0.0000 [IU] | Freq: Every day | INTRAMUSCULAR | Status: DC

## 2022-09-03 MED ORDER — INSULIN ASPART 100 UNIT/ML IJ SOLN
0.0000 [IU] | Freq: Three times a day (TID) | INTRAMUSCULAR | Status: DC
  Administered 2022-09-04 (×2): 3 [IU] via SUBCUTANEOUS
  Administered 2022-09-04 – 2022-09-05 (×2): 2 [IU] via SUBCUTANEOUS
  Administered 2022-09-05: 3 [IU] via SUBCUTANEOUS
  Administered 2022-09-05: 2 [IU] via SUBCUTANEOUS

## 2022-09-03 MED ORDER — INSULIN ASPART 100 UNIT/ML IJ SOLN: 0.0000 [IU] | Freq: Three times a day (TID) | INTRAMUSCULAR | Status: DC

## 2022-09-03 NOTE — Transfer of Care (Signed)
Immediate Anesthesia Transfer of Care Note  Patient: Gloria Sharp  Procedure(s) Performed: TRANSESOPHAGEAL ECHOCARDIOGRAM  Patient Location: Cath Lab  Anesthesia Type:MAC  Level of Consciousness: drowsy and patient cooperative  Airway & Oxygen Therapy: Patient Spontanous Breathing and Patient connected to nasal cannula oxygen  Post-op Assessment: Report given to RN and Post -op Vital signs reviewed and stable  Post vital signs: Reviewed and stable  Last Vitals:  Vitals Value Taken Time  BP    Temp    Pulse    Resp    SpO2      Last Pain:  Vitals:   09/03/22 1047  TempSrc: Oral  PainSc:       Patients Stated Pain Goal: 2 (09/01/22 1700)  Complications: No notable events documented.

## 2022-09-03 NOTE — Progress Notes (Signed)
Mobility Specialist Progress Note:   09/03/22 1000  Orthostatic Lying   BP- Lying 137/50  Pulse- Lying 77  Orthostatic Sitting  BP- Sitting 118/67  Pulse- Sitting 108  Orthostatic Standing at 0 minutes  BP- Standing at 0 minutes 116/72  Pulse- Standing at 0 minutes 132  Mobility  Activity Stood at bedside  Level of Assistance Minimal assist, patient does 75% or more  Assistive Device Front wheel walker  Activity Response Tolerated fair  Mobility Referral Yes  $Mobility charge 1 Mobility  Mobility Specialist Start Time (ACUTE ONLY) [phone]  Mobility Specialist Stop Time (ACUTE ONLY) 1031  Mobility Specialist Time Calculation (min) (ACUTE ONLY) 38 min    Pre Mobility: 77 HR,  137/50 (70) BP,  98% SpO2                                    118/67 (83) BP - sitting During Mobility: 132 HR,  116/72 (86) BP,  99% SpO2 Post Mobility:  90 HR, 99% SpO2  Pt received in bed, agreeable to mobility. MinA for bed mobility and STS. C/o dizziness when sitting on EOB but not unbearable. Wanted to attempt marching in place but unable to d/t dizziness worsening. VSS throughout. Pt left in bed with call bell and family present.  D'Vante Earlene Plater Mobility Specialist Please contact via Special educational needs teacher or Rehab office at [phone]

## 2022-09-03 NOTE — Progress Notes (Signed)
PROGRESS NOTE  Gloria Sharp MWN:[id number] DOB: 1945-10-05 DOA: 08/30/2022 PCP: Abner Greenspan, MD   LOS: 4 days   Brief Narrative / Interim history: 77 y/o woman with a history of DM, HTN, FM, IBS who presented after having sudden onset SOB tonight. She had just gotten back in bed from going to the bathroom when she told her husband that she couldn't breathe and wanted him to call EMS. When the fire department got there she was unconscious and ineffectively breathing so they started BVM until EMS arrived. She remained unconscious requiring BVM all the way to the hospital. She was intubated in the ED. She has no history of blood clots or MI. She has known murmurs and 2-vessel CAD since LHC in 05/2022. She is on Plavix chronically but no other blood thinners.   Subjective / 24h Interval events: Tells me she got very weak yesterday upon standing  Assesement and Plan: Principal Problem:   Acute respiratory failure with hypoxia and hypercapnia (HCC) Active Problems:   Syncope and collapse   Respiratory arrest [facility])  Principal problem Presumed septic shock in the setting of UTI -initially admitted to the ICU, now off pressors and transferred to the hospitalist service 8/18.  Completing 5 days of cefepime today.  Cultures all negative  Active problems Acute hypoxic and hypercarbic respiratory failure in the setting of acute pulmonary edema due to acute systolic CHF -2D echo done on 4/03 shows an EF of 25%, global hypokinesis, grade 1 diastolic dysfunction.  CHF team consulted and following -Diuresed well with furosemide.  Cardiac monitor I, TEE all pending this morning -Hold diuresis due to AKI  AKI-due to Lasix, also symptomatic with mild intravascular depletion.  Hold Lasix  CAD -most recent cardiac cath May 2024 with known borderline LAD lesion with a long 65-70% mid LAD lesion and mid left circumflex.  Medical management -Troponin elevation noted  Elevated LFTs -in the setting of #1, now  normalized  Hypokalemia-replete and continue to monitor  AKI on CKD 3A -creatinine returned to baseline  Normocytic anemia-monitor hemoglobin  Scheduled Meds:  atorvastatin  80 mg Oral Daily   brimonidine  1 drop Both Eyes BID   Chlorhexidine Gluconate Cloth  6 each Topical Daily   clopidogrel  75 mg Oral Daily   enoxaparin (LOVENOX) injection  40 mg Subcutaneous QHS   famotidine  20 mg Oral Daily   fenofibrate  160 mg Oral Daily   gabapentin  100 mg Oral TID   insulin aspart  0-15 Units Subcutaneous Q4H   pantoprazole  40 mg Oral Daily   sacubitril-valsartan  1 tablet Oral BID   sertraline  50 mg Oral Daily   sodium chloride flush  3 mL Intravenous Q12H   spironolactone  25 mg Oral Daily   Continuous Infusions:  sodium chloride     sodium chloride 20 mL/hr at 09/03/22 0952   ceFEPime (MAXIPIME) IV Stopped (09/02/22 2336)   magnesium sulfate bolus IVPB 2 g (09/03/22 0953)   PRN Meds:.sodium chloride, acetaminophen, diclofenac Sodium, docusate, ipratropium-albuterol, ondansetron (ZOFRAN) IV, mouth rinse, phenol, polyethylene glycol, sodium chloride flush, tiZANidine  Current Outpatient Medications  Medication Instructions   acetaminophen (TYLENOL) 650 mg, Oral, Daily PRN   alendronate (FOSAMAX) 35 mg, Oral, Every 7 days   amLODipine (NORVASC) 10 mg, Oral, Daily   atorvastatin (LIPITOR) 80 mg, Oral, Daily   brimonidine (ALPHAGAN) 0.15 % ophthalmic solution 1 drop, Both Eyes, 2 times daily   carvedilol (COREG) 12.5 mg, Oral, 2 times  daily with meals   cholestyramine (QUESTRAN) 4 g, Oral, Daily, Take at least 2 hours before or after rest of the medications   clopidogrel (PLAVIX) 75 mg, Oral, Daily   Cyanocobalamin (VITAMIN B-12 PO) 1 tablet, Oral, Daily   diclofenac Sodium (VOLTAREN) 2 g, Topical, At bedtime PRN   fenofibrate (TRICOR) 145 mg, Oral, Daily   ferrous sulfate 325 mg, Oral, Daily   furosemide (LASIX) 20 mg, Oral, Daily   gabapentin (NEURONTIN) 300 mg, Oral, 3  times daily   Infant Care Products (DERMACLOUD) OINT 1 application , Topical, 2 times daily PRN   irbesartan (AVAPRO) 150 mg, Oral, Daily   MAGNESIUM PO 1 tablet, Oral, Daily   metFORMIN (GLUCOPHAGE) 1,000 mg, Oral, 2 times daily with meals   Omega-3 Fatty Acids (FISH OIL PO) 1 capsule, Oral, Daily   OVER THE COUNTER MEDICATION 1 patch, Topical, Daily PRN, [place] pain patch   pantoprazole (PROTONIX) 40 mg, Oral, Daily   potassium chloride SA (KLOR-CON M) 20 MEQ tablet 40 mEq, Oral, Daily   sertraline (ZOLOFT) 50 mg, Oral, Daily   tiZANidine (ZANAFLEX) 2 mg, Oral, Every 6 hours PRN   TURMERIC PO 1 tablet, Oral, Daily    Diet Orders (From admission, onward)     Start     Ordered   09/03/22 0001  Diet NPO time specified  Diet effective midnight       Comments: Patient to remain NPO until fully awake following the TEE.   09/02/22 1521            DVT prophylaxis: enoxaparin (LOVENOX) injection 40 mg Start: 08/31/22 2200 SCDs Start: 08/30/22 0507   Lab Results  Component Value Date   PLT 343 09/03/2022      Code Status: Full Code  Family Communication: no family at bedside   Status is: Inpatient Remains inpatient appropriate because: severity of illness  Level of care: Progressive  Consultants:  Cardiology  PCCM  Objective: Vitals:   09/03/22 0410 09/03/22 0640 09/03/22 0807 09/03/22 0948  BP: (!) 126/56  (!) 90/48 (!) 137/50  Pulse: 84  86 75  Resp: 15  12 18   Temp: 98.3 F (36.8 C)  98.1 F (36.7 C)   TempSrc: Oral  Oral   SpO2: 96%  95% 96%  Weight:  95 kg    Height:        Intake/Output Summary (Last 24 hours) at 09/03/2022 1043 Last data filed at 09/03/2022 1017 Gross per 24 hour  Intake 437 ml  Output 200 ml  Net 237 ml   Wt Readings from Last 3 Encounters:  09/03/22 95 kg  05/24/22 95.3 kg  05/15/22 96.3 kg    Examination:  Constitutional: NAD Eyes: lids and conjunctivae normal, no scleral icterus ENMT: mmm Neck: normal,  supple Respiratory: clear to auscultation bilaterally, no wheezing, no crackles.  Cardiovascular: Regular rate and rhythm, no murmurs / rubs / gallops. No LE edema. Abdomen: soft, no distention, no tenderness. Bowel sounds positive.   Data Reviewed: I have independently reviewed following labs and imaging studies   CBC Recent Labs  Lab 08/30/22 0322 08/30/22 0450 08/30/22 1251 08/30/22 1258 08/31/22 0412 09/01/22 0511 09/03/22 0428  WBC 16.2*  --   --   --  17.0* 14.1* 11.3*  HGB 10.9*  12.6   < > 10.2* 9.9* 8.5* 8.1* 9.4*  HCT 36.5  37.0   < > 30.0* 29.0* 26.8* 25.3* 29.8*  PLT 517*  --   --   --  365 277 343  MCV 89.7  --   --   --  86.2 85.2 85.1  MCH 26.8  --   --   --  27.3 27.3 26.9  MCHC 29.9*  --   --   --  31.7 32.0 31.5  RDW 14.7  --   --   --  15.1 14.9 14.5  LYMPHSABS 3.0  --   --   --   --  1.5  --   MONOABS 1.2*  --   --   --   --  1.2*  --   EOSABS 0.2  --   --   --   --  0.5  --   BASOSABS 0.1  --   --   --   --  0.0  --    < > = values in this interval not displayed.    Recent Labs  Lab 08/30/22 0322 08/30/22 0324 08/30/22 0450 08/30/22 0828 08/30/22 1249 08/31/22 0412 08/31/22 1111 08/31/22 2025 09/01/22 0511 09/01/22 1143 09/01/22 1655 09/02/22 0450 09/03/22 0428  NA 135  139  --    < >  --    < > 142 138 139 137 137 137 139 136  K 4.7  4.7  --    < >  --    < > 3.1* 4.2 3.3* 3.8 3.8 3.8 3.5 3.6  CL 106  105  --   --   --    < > 108 107 106 106 102 99 98 99  CO2 15*  --   --   --    < > 20* 20* 22 25 25 23 26 25   GLUCOSE 295*  300*  --   --   --    < > 140* 144* 113* 112* 111* 160* 125* 142*  BUN 25*  27*  --   --   --    < > 30* 26* 23 21 22 20  24* 37*  CREATININE 1.07*  1.20*  --   --   --    < > 1.32* 1.08* 1.07* 1.11* 0.94 0.90 0.94 1.29*  CALCIUM 8.6*  --   --   --    < > 8.1* 8.0* 8.0* 8.3* 9.1 9.1 9.3 8.4*  AST 42*  --   --   --   --   --  35 37  --   --   --   --  40  ALT 28  --   --   --   --   --  24 23  --   --   --   --   34  ALKPHOS 75  --   --   --   --   --  57 60  --   --   --   --  63  BILITOT 0.7  --   --   --   --   --  0.8 1.2  --   --   --   --  0.5  ALBUMIN 3.3*  --   --   --   --   --  2.7* 2.6*  --   --   --   --  2.6*  MG  --   --   --   --    < > 2.8*  --  1.7 2.2 1.9  --   --  1.6*  LATICACIDVEN  --  4.0*  --  1.1  --   --   --   --   --   --   --   --   --  INR 1.3*  --   --   --   --   --   --   --   --   --   --   --   --   BNP 458.3*  --   --   --   --   --   --   --   --   --   --   --   --    < > = values in this interval not displayed.    ------------------------------------------------------------------------------------------------------------------ Recent Labs    09/03/22 0428  TRIG 189*    Lab Results  Component Value Date   HGBA1C 5.9 (H) 05/15/2022   ------------------------------------------------------------------------------------------------------------------ No results for input(s): "TSH", "T4TOTAL", "T3FREE", "THYROIDAB" in the last 72 hours.  Invalid input(s): "FREET3"  Cardiac Enzymes No results for input(s): "CKMB", "TROPONINI", "MYOGLOBIN" in the last 168 hours.  Invalid input(s): "CK" ------------------------------------------------------------------------------------------------------------------    Component Value Date/Time   BNP 458.3 (H) 08/30/2022 0322    CBG: Recent Labs  Lab 09/02/22 1607 09/02/22 1953 09/02/22 2308 09/03/22 0408 09/03/22 0821  GLUCAP 207* 125* 127* 149* 124*    Recent Results (from the past 240 hour(s))  Blood culture (routine x 2)     Status: None (Preliminary result)   Collection Time: 08/30/22  3:18 AM   Specimen: BLOOD RIGHT HAND  Result Value Ref Range Status   Specimen Description BLOOD RIGHT HAND  Final   Special Requests   Final    BOTTLES DRAWN AEROBIC AND ANAEROBIC Blood Culture adequate volume   Culture   Final    NO GROWTH 4 DAYS Performed at [facility], [address] [place], [place]  [address]    Report Status PENDING  Incomplete  Blood culture (routine x 2)     Status: None (Preliminary result)   Collection Time: 08/30/22  3:22 AM   Specimen: BLOOD  Result Value Ref Range Status   Specimen Description BLOOD RIGHT ANTECUBITAL  Final   Special Requests   Final    BOTTLES DRAWN AEROBIC AND ANAEROBIC Blood Culture adequate volume   Culture   Final    NO GROWTH 4 DAYS Performed at [facility], [address] [place], [place] [address]    Report Status PENDING  Incomplete  Resp panel by RT-PCR (RSV, Flu A&B, Covid) Anterior Nasal Swab     Status: None   Collection Time: 08/30/22  5:00 AM   Specimen: Anterior Nasal Swab  Result Value Ref Range Status   SARS Coronavirus 2 by RT PCR NEGATIVE NEGATIVE Final   Influenza A by PCR NEGATIVE NEGATIVE Final   Influenza B by PCR NEGATIVE NEGATIVE Final    Comment: (NOTE) The Xpert Xpress SARS-CoV-2/FLU/RSV plus assay is intended as an aid in the diagnosis of influenza from Nasopharyngeal swab specimens and should not be used as a sole basis for treatment. Nasal washings and aspirates are unacceptable for Xpert Xpress SARS-CoV-2/FLU/RSV testing.  Fact Sheet for Patients: BloggerCourse.com  Fact Sheet for Healthcare Providers: SeriousBroker.it  This test is not yet approved or cleared by the [place] FDA and has been authorized for detection and/or diagnosis of SARS-CoV-2 by FDA under an Emergency Use Authorization (EUA). This EUA will remain in effect (meaning this test can be used) for the duration of the COVID-19 declaration under Section 564(b)(1) of the Act, 21 U.S.C. section 360bbb-3(b)(1), unless the authorization is terminated or revoked.     Resp Syncytial Virus by PCR  NEGATIVE NEGATIVE Final    Comment: (NOTE) Fact Sheet for Patients: BloggerCourse.com  Fact Sheet for Healthcare  Providers: SeriousBroker.it  This test is not yet approved or cleared by the [place] FDA and has been authorized for detection and/or diagnosis of SARS-CoV-2 by FDA under an Emergency Use Authorization (EUA). This EUA will remain in effect (meaning this test can be used) for the duration of the COVID-19 declaration under Section 564(b)(1) of the Act, 21 U.S.C. section 360bbb-3(b)(1), unless the authorization is terminated or revoked.  Performed at [facility], [address], [place], [place] [address]   MRSA Next Gen by PCR, Nasal     Status: None   Collection Time: 08/30/22  6:30 AM   Specimen: Nasal Mucosa; Nasal Swab  Result Value Ref Range Status   MRSA by PCR Next Gen NOT DETECTED NOT DETECTED Final    Comment: (NOTE) The GeneXpert MRSA Assay (FDA approved for NASAL specimens only), is one component of a comprehensive MRSA colonization surveillance program. It is not intended to diagnose MRSA infection nor to guide or monitor treatment for MRSA infections. Test performance is not FDA approved in patients less than 2 years old. Performed at [facility], [address] [place], [place] [address]      Radiology Studies: No results found.   Pamella Pert, MD, PhD [facility]  Between 7 am - 7 pm I am available, please contact me via Amion (for emergencies) or Securechat (non urgent messages)  Between 7 pm - 7 am I am not available, please contact night coverage MD/APP via Amion

## 2022-09-03 NOTE — Anesthesia Postprocedure Evaluation (Signed)
Anesthesia Post Note  Patient: Gloria Sharp  Procedure(s) Performed: TRANSESOPHAGEAL ECHOCARDIOGRAM     Patient location during evaluation: PACU Anesthesia Type: MAC Level of consciousness: awake and alert Pain management: pain level controlled Vital Signs Assessment: post-procedure vital signs reviewed and stable Respiratory status: spontaneous breathing, nonlabored ventilation and respiratory function stable Cardiovascular status: blood pressure returned to baseline and stable Postop Assessment: no apparent nausea or vomiting Anesthetic complications: no   No notable events documented.  Last Vitals:  Vitals:   09/03/22 1412 09/03/22 1416  BP: (!) 133/57 (!) 137/52  Pulse: 78 79  Resp: 10 12  Temp:    SpO2: 94% 95%    Last Pain:  Vitals:   09/03/22 1441  TempSrc:   PainSc: 0-No pain                 Lannie Fields

## 2022-09-03 NOTE — Care Management Important Message (Signed)
Important Message  Patient Details  Name: Gloria Sharp MRN: [id number] Date of Birth: Aug 18, 1945   Medicare Important Message Given:  Yes     Palmyra Rogacki Stefan Church 09/03/2022, 3:48 PM

## 2022-09-03 NOTE — TOC Progression Note (Signed)
Transition of Care [facility]) - Progression Note    Patient Details  Name: Gloria Sharp MRN: [id number] Date of Birth: [birth year]  Transition of Care [facility]) CM/SW Contact  Nicanor Bake Phone Number: [phone] 09/03/2022, 12:30 PM  Clinical Narrative:   CSW met with the pt and husband at bedside. CSW built rapport with the family. CSW asked if the family has used [facility] services before. The pts husband stated no, and that he normally would provide care for the pt but stated that he recognizes he will need some additional help. CSW printed a list of HH options for the family to review. CSW/CM will follow up about HH option choice. Family stated that they needed some time to review. CSW also left a copy of HH  choices with front desk receptionist since the pts chart was not there due to her being absent for a procedure. CSW stated that a follow up PCP appointment will be scheduled at a later time. TOC will continue following.         Expected Discharge Plan and Services                                               Social Determinants of Health (SDOH) Interventions SDOH Screenings   Food Insecurity: No Food Insecurity (05/15/2022)  Housing: Low Risk  (05/15/2022)  Transportation Needs: No Transportation Needs (05/15/2022)  Utilities: Not At Risk (05/15/2022)  Tobacco Use: Low Risk  (08/30/2022)    Readmission Risk Interventions     No data to display

## 2022-09-03 NOTE — Plan of Care (Signed)
  Problem: Education: Goal: Knowledge of General Education information will improve Description: Including pain rating scale, medication(s)/side effects and non-pharmacologic comfort measures Outcome: Progressing   Problem: Health Behavior/Discharge Planning: Goal: Ability to manage health-related needs will improve Outcome: Progressing   Problem: Clinical Measurements: Goal: Ability to maintain clinical measurements within normal limits will improve Outcome: Progressing Goal: Will remain free from infection Outcome: Progressing Goal: Diagnostic test results will improve Outcome: Progressing Goal: Respiratory complications will improve Outcome: Progressing Goal: Cardiovascular complication will be avoided Outcome: Progressing   Problem: Activity: Goal: Risk for activity intolerance will decrease Outcome: Progressing   Problem: Nutrition: Goal: Adequate nutrition will be maintained Outcome: Progressing   Problem: Coping: Goal: Level of anxiety will decrease Outcome: Progressing   Problem: Elimination: Goal: Will not experience complications related to bowel motility Outcome: Progressing Goal: Will not experience complications related to urinary retention Outcome: Progressing   Problem: Pain Managment: Goal: General experience of comfort will improve Outcome: Progressing   Problem: Safety: Goal: Ability to remain free from injury will improve Outcome: Progressing   Problem: Skin Integrity: Goal: Risk for impaired skin integrity will decrease Outcome: Progressing   Problem: Education: Goal: Understanding of CV disease, CV risk reduction, and recovery process will improve Outcome: Progressing Goal: Individualized Educational Video(s) Outcome: Progressing   Problem: Activity: Goal: Ability to return to baseline activity level will improve Outcome: Progressing   Problem: Cardiovascular: Goal: Ability to achieve and maintain adequate cardiovascular perfusion  will improve Outcome: Progressing   Problem: Cardiovascular: Goal: Vascular access site(s) Level 0-1 will be maintained Outcome: Progressing   Problem: Health Behavior/Discharge Planning: Goal: Ability to safely manage health-related needs after discharge will improve Outcome: Progressing

## 2022-09-03 NOTE — Progress Notes (Signed)
Patient ID: Gloria Sharp, female   DOB: [birth year], 77 y.o.   MRN: [id number]     Advanced Heart Failure Rounding Note  PCP-Cardiologist: Gypsy Balsam, MD   Subjective:    Co-ox 63%. CVP not working correctly. Overall net negative 5.8L this admit. Feels better. Breathing improved. LEE resolved. Says legs "look normal again"   Scr 0.94>>1.29. SBPs 110s-120s.   On cefepime for UTI  Echo: EF 25%, RV normal, moderate MR, moderate low flow/low gradient AS with AVA 1.1 cm^2, IVC dilated  Objective:   Weight Range: 95 kg Body mass index is 40.9 kg/m.   Vital Signs:   Temp:  [97.6 F (36.4 C)-98.3 F (36.8 C)] 98.3 F (36.8 C) (08/19 0410) Pulse Rate:  [77-92] 84 (08/19 0410) Resp:  [15-25] 15 (08/19 0410) BP: (101-127)/(55-60) 126/56 (08/19 0410) SpO2:  [94 %-96 %] 96 % (08/19 0410) Weight:  [95 kg] 95 kg (08/19 0640) Last BM Date : 09/02/22  Weight change: Filed Weights   08/31/22 0431 09/02/22 0329 09/03/22 0640  Weight: 97.5 kg 93.7 kg 95 kg    Intake/Output:   Intake/Output Summary (Last 24 hours) at 09/03/2022 0846 Last data filed at 09/03/2022 0600 Gross per 24 hour  Intake 355 ml  Output 200 ml  Net 155 ml      Physical Exam   General:  Well appearing. No respiratory difficulty HEENT: normal Neck: supple. + Lt internal jugular CVC, JVD 7-8 cm Carotids 2+ bilat; no bruits. No lymphadenopathy or thyromegaly appreciated. Cor: PMI nondisplaced. Regular rate & rhythm. 2/6 SEM RUSB  Lungs: clear Abdomen: soft, nontender, nondistended. No hepatosplenomegaly. No bruits or masses. Good bowel sounds. Extremities: no cyanosis, clubbing, rash, edema Neuro: alert & oriented x 3, cranial nerves grossly intact. moves all 4 extremities w/o difficulty. Affect pleasant.   Telemetry   NSR 70s (personally reviewed)   Labs    CBC Recent Labs    09/01/22 0511 09/03/22 0428  WBC 14.1* 11.3*  NEUTROABS 10.8*  --   HGB 8.1* 9.4*  HCT 25.3* 29.8*  MCV 85.2 85.1   PLT 277 343   Basic Metabolic Panel Recent Labs    40/98/11 1143 09/01/22 1655 09/02/22 0450 09/03/22 0428  NA 137   < > 139 136  K 3.8   < > 3.5 3.6  CL 102   < > 98 99  CO2 25   < > 26 25  GLUCOSE 111*   < > 125* 142*  BUN 22   < > 24* 37*  CREATININE 0.94   < > 0.94 1.29*  CALCIUM 9.1   < > 9.3 8.4*  MG 1.9  --   --  1.6*   < > = values in this interval not displayed.   Liver Function Tests Recent Labs    08/31/22 2025 09/03/22 0428  AST 37 40  ALT 23 34  ALKPHOS 60 63  BILITOT 1.2 0.5  PROT 5.3* 5.5*  ALBUMIN 2.6* 2.6*   No results for input(s): "LIPASE", "AMYLASE" in the last 72 hours. Cardiac Enzymes No results for input(s): "CKTOTAL", "CKMB", "CKMBINDEX", "TROPONINI" in the last 72 hours.  BNP: BNP (last 3 results) Recent Labs    08/30/22 0322  BNP 458.3*    ProBNP (last 3 results) No results for input(s): "PROBNP" in the last 8760 hours.   D-Dimer No results for input(s): "DDIMER" in the last 72 hours. Hemoglobin A1C No results for input(s): "HGBA1C" in the last 72 hours. Fasting Lipid Panel  Recent Labs    09/03/22 0428  TRIG 189*   Thyroid Function Tests No results for input(s): "TSH", "T4TOTAL", "T3FREE", "THYROIDAB" in the last 72 hours.  Invalid input(s): "FREET3"  Other results:   Imaging    No results found.   Medications:     Scheduled Medications:  atorvastatin  80 mg Oral Daily   brimonidine  1 drop Both Eyes BID   Chlorhexidine Gluconate Cloth  6 each Topical Daily   clopidogrel  75 mg Oral Daily   enoxaparin (LOVENOX) injection  40 mg Subcutaneous QHS   famotidine  20 mg Oral Daily   fenofibrate  160 mg Oral Daily   gabapentin  100 mg Oral TID   insulin aspart  0-15 Units Subcutaneous Q4H   pantoprazole  40 mg Oral Daily   sacubitril-valsartan  1 tablet Oral BID   sertraline  50 mg Oral Daily   sodium chloride flush  3 mL Intravenous Q12H   spironolactone  25 mg Oral Daily    Infusions:  sodium chloride      sodium chloride     ceFEPime (MAXIPIME) IV 2 g (09/02/22 2306)   magnesium sulfate bolus IVPB      PRN Medications: sodium chloride, acetaminophen, diclofenac Sodium, docusate, ipratropium-albuterol, ondansetron (ZOFRAN) IV, mouth rinse, phenol, polyethylene glycol, sodium chloride flush, tiZANidine   Assessment/Plan   1. Shock: Resolved.  Thought to be primarily septic shock from UTI, on cefepime.  2. ID: UTI. No culture data.  - Continue cefepime.  3. Acute systolic CHF: Prior echo with normal EF in 5/24. Echo this admission with EF 25%, RV normal, moderate MR, moderate low flow/low gradient AS with AVA 1.1 cm^2, IVC dilated.  HS-TnI elevated to 1948 but cath did not show culprit lesion for NSTEMI.  There was moderate diffuse CAD on cath that did not explain fall in EF.  Patient has LBBB but this appears to be chronic.  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  She has diuresed well. Near Euvolemic on exam. CVP not working correctly. Co-ox 63%  - Continue Entresto 24/26 bid.  - Continue Spironolactone 25 mg daily.  - Eventual beta blocker.  - No Marcelline Deist yet with UTI this admission.  - start PO Lasix tomorrow  - Will order cardiac MRI to assess for infiltrative disease/myocarditis. Plan today  - TEE today to better define her valvular disease (AS and MR)   4. Aortic stenosis: Looks like moderate low flow/low gradient aortic stenosis but plan TEE as above to more closely evaluate.  5. Mitral regurgitation: Looks like moderate functional MR by echo but as above, TEE to more closely evaluate.  6. CAD: Moderate multivessel disease on cath this admission, does not explain fall in EF.  No culprit lesion to treat.  - Continue Plavix + statin.  7. Anemia: Fe deficient with transferrin saturation 5%, IV Fe after cMRI.  8. AKI: Scr 0.9>>1.30. Co-ox ok. Suspect over diuresis.  - hold loop diuretics today  - follow BMP   Length of Stay: [address], PA-C   09/03/2022, 8:46 AM  Advanced Heart Failure Team Pager [phone] (M-F; 7a - 5p)  Please contact [facility] for night-coverage after hours (5p -7a ) and weekends on [email or web address]

## 2022-09-03 NOTE — Progress Notes (Signed)
PT Cancellation Note  Patient Details Name: Gloria Sharp MRN: [id number] DOB: 1945/09/26   Cancelled Treatment:    Reason Eval/Treat Not Completed: Patient at procedure or test/unavailable; down for heart cath.  Will attempt again as able.    Elray Mcgregor 09/03/2022, 1:35 PM Sheran Lawless, PT Acute Rehabilitation Services [phone] 09/03/2022

## 2022-09-03 NOTE — Plan of Care (Signed)
  Problem: Activity: Goal: Risk for activity intolerance will decrease Outcome: Progressing   Problem: Nutrition: Goal: Adequate nutrition will be maintained Outcome: Progressing   Problem: Coping: Goal: Level of anxiety will decrease Outcome: Progressing   Problem: Pain Managment: Goal: General experience of comfort will improve Outcome: Progressing   Problem: Safety: Goal: Ability to remain free from injury will improve Outcome: Progressing   

## 2022-09-03 NOTE — Progress Notes (Signed)
Heart Failure Navigator Progress Note  Assessed for Heart & Vascular TOC clinic readiness.  Patient does not meet criteria due to Advanced Heart Failure patient of Dr. Bensimhon.   Navigator will sign off at this time.    Dawn Fields, BSN, RN Heart Failure Nurse Navigator Secure Chat Only   

## 2022-09-03 NOTE — Anesthesia Preprocedure Evaluation (Addendum)
Anesthesia Evaluation  Patient identified by MRN, date of birth, ID band Patient awake    Reviewed: Allergy & Precautions, NPO status , Patient's Chart, lab work & pertinent test results  History of Anesthesia Complications (+) PONV and history of anesthetic complications  Airway Mallampati: III  TM Distance: >3 FB Neck ROM: Full    Dental  (+) Teeth Intact, Dental Advisory Given   Pulmonary neg pulmonary ROS Snores, has never had sleep study    Pulmonary exam normal breath sounds clear to auscultation       Cardiovascular hypertension (145/51 preop), Pt. on medications + CAD  Normal cardiovascular exam Rhythm:Regular Rate:Normal  TTE 08/30/22:  1. Left ventricular ejection fraction, by estimation, is 25%. The left  ventricle has severely decreased function. The left ventricle demonstrates global hypokinesis. Left ventricular diastolic parameters are consistent with Grade I diastolic dysfunction (impaired relaxation).   2. Right ventricular systolic function is normal. The right ventricular size is normal. There is moderately elevated pulmonary artery systolic pressure.   3. Left atrial size was mildly dilated.   4. The mitral valve is degenerative. Moderate mitral valve regurgitation. No evidence of mitral stenosis. Moderate mitral annular calcification.   5. The aortic valve is tricuspid. There is moderate calcification of the aortic valve. Aortic valve regurgitation is not visualized. Moderate aortic valve stenosis. Aortic valve area, by VTI measures 1.11 cm. Aortic valve mean gradient measures 11.0  mmHg. Aortic valve Vmax measures 2.16 m/s.   6. The inferior vena cava is dilated in size with <50% respiratory variability, suggesting right atrial pressure of 15 mmHg.   Conclusion(s)/Recommendation(s): Report ammended from original interpretation. Aortic valve is moderately calcified with restricted leaflet mobility. Mean gradient  only 11 but measures AVA 1.1cm2 with dimensionless index of 0.34. Suspect there is at least moderate (if not, severe) low-flow, low-gradient AS.   LHC 08/30/22:   Mid LAD lesion is 65% stenosed.   Dist Cx lesion is 65% stenosed.   Ost Cx to Prox Cx lesion is 30% stenosed.   Prox RCA-2 lesion is 20% stenosed.   Prox RCA-1 lesion is 30% stenosed.   2nd Diag lesion is 40% stenosed.   RPAV lesion is 80% stenosed.   1.  Unchanged moderate multivessel disease with no acute occlusions. 2.  Fick cardiac output of 7.8 L/min and Fick cardiac index of 4.1 L/min/m with thermodilution cardiac output of 6.3 L/min and thermodilution cardiac index of 3.3 L/min/m with the following hemodynamics:             Right atrial pressure mean of 12 mmHg             Right ventricular pressure 47/12 with an end-diastolic pressure of 19 mmHg             Wedge pressure mean 24 mmHg             PA pressure 54/28 with a mean of 40 mmHg             Pulmonary vascular resistance of 2 Woods units 3.  LVEDP of 30 mmHg     Neuro/Psych CVA (?2015)    GI/Hepatic Neg liver ROS,GERD  Controlled,,  Endo/Other  diabetes, Well Controlled, Type 2  Morbid obesity (BMI 41)  Renal/GU ARFRenal disease (Cr 1.29)  negative genitourinary   Musculoskeletal  (+) Arthritis ,  Fibromyalgia -  Abdominal   Peds  Hematology  (+) Blood dyscrasia (Hgb 9.4), anemia   Anesthesia Other Findings Day of surgery medications  reviewed with patient.  Reproductive/Obstetrics negative OB ROS                             Anesthesia Physical Anesthesia Plan  ASA: 4  Anesthesia Plan: MAC   Post-op Pain Management: Minimal or no pain anticipated   Induction:   PONV Risk Score and Plan: Treatment may vary due to age or medical condition and Propofol infusion  Airway Management Planned: Natural Airway and Nasal Cannula  Additional Equipment: None  Intra-op Plan:   Post-operative Plan:   Informed  Consent: I have reviewed the patients History and Physical, chart, labs and discussed the procedure including the risks, benefits and alternatives for the proposed anesthesia with the patient or authorized representative who has indicated his/her understanding and acceptance.       Plan Discussed with: CRNA  Anesthesia Plan Comments:         Anesthesia Quick Evaluation

## 2022-09-04 ENCOUNTER — Encounter (HOSPITAL_COMMUNITY): Payer: Self-pay | Admitting: Cardiology

## 2022-09-04 DIAGNOSIS — J9601 Acute respiratory failure with hypoxia: Secondary | ICD-10-CM | POA: Diagnosis not present

## 2022-09-04 DIAGNOSIS — J9602 Acute respiratory failure with hypercapnia: Secondary | ICD-10-CM | POA: Diagnosis not present

## 2022-09-04 LAB — CULTURE, BLOOD (ROUTINE X 2)
Culture: NO GROWTH
Culture: NO GROWTH
Special Requests: ADEQUATE
Special Requests: ADEQUATE

## 2022-09-04 LAB — CBC
HCT: 28.3 % — ABNORMAL LOW (ref 36.0–46.0)
Hemoglobin: 9 g/dL — ABNORMAL LOW (ref 12.0–15.0)
MCH: 27.1 pg (ref 26.0–34.0)
MCHC: 31.8 g/dL (ref 30.0–36.0)
MCV: 85.2 fL (ref 80.0–100.0)
Platelets: 323 10*3/uL (ref 150–400)
RBC: 3.32 MIL/uL — ABNORMAL LOW (ref 3.87–5.11)
RDW: 14.4 % (ref 11.5–15.5)
WBC: 11 10*3/uL — ABNORMAL HIGH (ref 4.0–10.5)
nRBC: 0 % (ref 0.0–0.2)

## 2022-09-04 LAB — GLUCOSE, CAPILLARY
Glucose-Capillary: 151 mg/dL — ABNORMAL HIGH (ref 70–99)
Glucose-Capillary: 236 mg/dL — ABNORMAL HIGH (ref 70–99)
Glucose-Capillary: 194 mg/dL — ABNORMAL HIGH (ref 70–99)
Glucose-Capillary: 134 mg/dL — ABNORMAL HIGH (ref 70–99)
Glucose-Capillary: 151 mg/dL — ABNORMAL HIGH (ref 70–99)

## 2022-09-04 LAB — COOXEMETRY PANEL
Carboxyhemoglobin: 1.9 % — ABNORMAL HIGH (ref 0.5–1.5)
Methemoglobin: <0.7 % (ref 0.0–1.5)
O2 Saturation: 75.9 %
Total hemoglobin: 9.4 g/dL — ABNORMAL LOW (ref 12.0–16.0)

## 2022-09-04 LAB — BASIC METABOLIC PANEL
Anion gap: 9 (ref 5–15)
BUN: 32 mg/dL — ABNORMAL HIGH (ref 8–23)
CO2: 23 mmol/L (ref 22–32)
Calcium: 8.3 mg/dL — ABNORMAL LOW (ref 8.9–10.3)
Chloride: 105 mmol/L (ref 98–111)
Creatinine, Ser: 0.88 mg/dL (ref 0.44–1.00)
GFR, Estimated: >60 mL/min (ref >60)
Glucose, Bld: 123 mg/dL — ABNORMAL HIGH (ref 70–99)
Potassium: 3.8 mmol/L (ref 3.5–5.1)
Sodium: 137 mmol/L (ref 135–145)

## 2022-09-04 LAB — MAGNESIUM: Magnesium: 1.9 mg/dL (ref 1.7–2.4)

## 2022-09-04 MED ORDER — IRON SUCROSE 500 MG IVPB - SIMPLE MED
500.0000 mg | INTRAVENOUS | Status: AC
  Administered 2022-09-04 – 2022-09-05 (×2): 500 mg via INTRAVENOUS
  Filled 2022-09-04 (×2): qty 275

## 2022-09-04 NOTE — TOC Initial Note (Signed)
Transition of Care [facility]) - Initial/Assessment Note    Patient Details  Name: Gloria Sharp MRN: [id number] Date of Birth: 08-19-1945  Transition of Care [facility] CM/SW Contact:    Elliot Cousin, RN Phone Number: [phone] 09/04/2022, 2:05 PM  Clinical Narrative:    HF TOC CM spoke to pt and husband at bedside. Offered choice for HH, medicare.gov list with rating on chart. Pt agreeable to [facility]. Contacted rep, Tresa Endo with new referral. Contacted [employer] with referral for bedside commode, 3n1 for home. Pt has RW, cane and rollator. Pt states she can purchase a scale for home. Educated on daily weights and low sodium heart healthy diet. Husband will provide transportation home.          PCP hospital follow up appt scheduled for 09/10/2022 at 11 am       Expected Discharge Plan: Home w Home Health Services Barriers to Discharge: Continued Medical Work up   Patient Goals and CMS Choice Patient states their goals for this hospitalization and ongoing recovery are:: wants to stay mobile with little pain   Choice offered to / list presented to : Patient      Expected Discharge Plan and Services   Discharge Planning Services: CM Consult Post Acute Care Choice: Home Health Living arrangements for the past 2 months: Single Family Home                 DME Arranged: 3-N-1 DME Agency: [employer] Date DME Agency Contacted: 09/04/22 Time DME Agency Contacted: [phone] Representative spoke with at DME Agency: Leavy Cella HH Arranged: PT, RN HH Agency: [facility] Date [facility] Agency Contacted: 09/04/22 Time HH Agency Contacted: [phone] Representative spoke with at [facility] Agency: Hassel Neth  Prior Living Arrangements/Services Living arrangements for the past 2 months: Single Family Home Lives with:: Spouse Patient language and need for interpreter reviewed:: Yes Do you feel safe going back to the place where you live?: Yes      Need for Family Participation in Patient  Care: Yes (Comment) Care giver support system in place?: Yes (comment) Current home services: DME (rolling walker, rollator) Criminal Activity/Legal Involvement Pertinent to Current Situation/Hospitalization: No - Comment as needed  Activities of Daily Living      Permission Sought/Granted Permission sought to share information with : Case Manager, Family Supports, PCP Permission granted to share information with : Yes, Verbal Permission Granted  Share Information with NAME: Tasi Bean  Permission granted to share info w AGENCY: Home Health  Permission granted to share info w Relationship: husband  Permission granted to share info w Contact Information: [phone]  Emotional Assessment Appearance:: Appears stated age Attitude/Demeanor/Rapport: Engaged Affect (typically observed): Accepting Orientation: : Oriented to Self, Oriented to Place, Oriented to  Time, Oriented to Situation   Psych Involvement: No (comment)  Admission diagnosis:  Syncope and collapse [R55] Respiratory arrest (HCC) [R09.2] Acute pulmonary edema (HCC) [J81.0] Acute respiratory failure with hypoxia and hypercapnia (HCC) [J96.01, J96.02] ST elevation myocardial infarction (STEMI), unspecified artery (HCC) [I21.3] Hypotension, unspecified hypotension type [I95.9] Patient Active Problem List   Diagnosis Date Noted   Acute respiratory failure with hypoxia and hypercapnia (HCC) 08/30/2022   Syncope and collapse 08/30/2022   Respiratory arrest (HCC) 08/30/2022   Angina pectoris (HCC) 05/15/2022   Sacroiliitis (HCC) 02/12/2022   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 sciatica 02/12/2022   Hypokalemia 03/27/2021   AKI (acute kidney injury) (HCC) 03/27/2021   Dizziness 03/27/2021   Hydronephrosis of right  kidney 03/26/2021   Class 3 obesity (HCC) 03/26/2021   Type 2 diabetes mellitus with hyperglycemia (HCC) 03/26/2021   Sepsis secondary to UTI (HCC) 03/26/2021   Post-operative nausea and vomiting     Obesity    Leukocytosis    Hypertension    Hypercholesterolemia    GERD (gastroesophageal reflux disease)    Fibromyalgia    Diabetes (HCC)    CVA (cerebral vascular accident) (HCC)    Aortic stenosis 09/04/2019   Mitral regurgitation 11/24/2018   Nonsustained ventricular tachycardia (HCC) 11/24/2018   Heart murmur 10/10/2018   Essential hypertension 10/10/2018   Dyslipidemia 10/10/2018   History of CVA (cerebrovascular accident) 10/10/2018   Type 2 diabetes mellitus without complication, without long-term current use of insulin (HCC) 10/10/2018   Cystocele with uterine prolapse 01/17/2018   Rectocele 01/17/2018   PCP:  Abner Greenspan, MD Pharmacy:   [address] - [place], [place] - [address] [place] [address] Phone: [phone] Fax: [phone]  [facility] [address] [place] [address] Phone: [phone] Fax: [phone]     Social Determinants of Health (SDOH) Social History: SDOH Screenings   Food Insecurity: No Food Insecurity (05/15/2022)  Housing: Low Risk  (05/15/2022)  Transportation Needs: No Transportation Needs (05/15/2022)  Utilities: Not At Risk (05/15/2022)  Tobacco Use: Low Risk  (08/30/2022)   SDOH Interventions:     Readmission Risk Interventions     No data to display

## 2022-09-04 NOTE — Plan of Care (Signed)

## 2022-09-04 NOTE — Progress Notes (Addendum)
Occupational Therapy Treatment Patient Details Name: Gloria Sharp MRN: [id number] DOB: 1945-11-28 Today's Date: 09/04/2022   History of present illness Pt is 77 year old presented to [facility] 08/30/22  after having sudden onset SOB tonight. Intubated in the ED;  acute pulmonary edema, shock, encephalopathy, Troponin elevated and trend c/w NSTEMI, extubated 08/31/22, echo with EF 25%, cardiac cath  with moderate 2-vessel disease PMH - chronic/rt leg pain, DM, HTN, CVA, aortic stenosis, fibromyalgia, IBS, asthma   OT comments  Pt progressing towards goals this session, in bathroom upon arrival, able to stand for grooming task and complete short hallway distance ambulation with x1 seated rest break due to fatigue and elevated HR. Pt HR up to 120-130 with ambulation, RN notified. Pt presenting with impairments listed below, will follow acutely. Continue to recommend HHOT at d/c.       If plan is discharge home, recommend the following:  A little help with walking and/or transfers;A lot of help with bathing/dressing/bathroom;Assistance with cooking/housework;Assistance with feeding;Assist for transportation;Help with stairs or ramp for entrance   Equipment Recommendations   Tub/shower bench;  BSC/3in1   Recommendations for Other Services PT consult    Precautions / Restrictions Precautions Precautions: Fall;Other (comment) Precaution Comments: watch HR and BP Restrictions Weight Bearing Restrictions: No       Mobility Bed Mobility               General bed mobility comments: in bathroom upon arrival, in chair at departure    Transfers Overall transfer level: Needs assistance Equipment used: Rolling walker (2 wheels) Transfers: Sit to/from Stand, Bed to chair/wheelchair/BSC Sit to Stand: Min assist                 Balance Overall balance assessment: Needs assistance Sitting-balance support: No upper extremity supported, Feet supported Sitting balance-Leahy Scale: Good      Standing balance support: Bilateral upper extremity supported, During functional activity, Reliant on assistive device for balance Standing balance-Leahy Scale: Poor Standing balance comment: reliant on RW support                           ADL either performed or assessed with clinical judgement   ADL Overall ADL's : Needs assistance/impaired     Grooming: Wash/dry hands;Standing;Contact guard assist Grooming Details (indicate cue type and reason): standing at sink         Upper Body Dressing : Minimal assistance;Standing Upper Body Dressing Details (indicate cue type and reason): donning gown on backside Lower Body Dressing: Moderate assistance Lower Body Dressing Details (indicate cue type and reason): pulling up socks in sitting Toilet Transfer: Contact guard assist;Ambulation;Rolling walker (2 wheels);Regular Toilet   Toileting- Clothing Manipulation and Hygiene: Maximal assistance Toileting - Clothing Manipulation Details (indicate cue type and reason): post pericare in standing     Functional mobility during ADLs: Contact guard assist;Rolling walker (2 wheels)      Extremity/Trunk Assessment Upper Extremity Assessment Upper Extremity Assessment: Generalized weakness   Lower Extremity Assessment Lower Extremity Assessment: Defer to PT evaluation        Vision   Vision Assessment?: No apparent visual deficits   Perception Perception Perception: Not tested   Praxis Praxis Praxis: Not tested    Cognition Arousal: Alert Behavior During Therapy: [facility] for tasks assessed/performed Overall Cognitive Status: Within Functional Limits for tasks assessed  General Comments: Pt AAOx4 and pleasant throughout session. Pt with cognition largely [facility] with noted short term memory deficits at baseline. Pt requiring occasional cues for sequencing novel tasks.        Exercises      Shoulder Instructions        General Comments VSS on RA, SpO2 with poor wave pleth while holding RW, mid 90's on RA with good wave    Pertinent Vitals/ Pain       Pain Assessment Pain Assessment: Faces Pain Location: back in standing Pain Descriptors / Indicators: Discomfort, Grimacing Pain Intervention(s): Limited activity within patient's tolerance, Monitored during session, Repositioned  Home Living                                          Prior Functioning/Environment              Frequency  Min 1X/week        Progress Toward Goals  OT Goals(current goals can now be found in the care plan section)  Progress towards OT goals: Progressing toward goals  Acute Rehab OT Goals Patient Stated Goal: to get better OT Goal Formulation: With patient/family Time For Goal Achievement: 09/16/22 Potential to Achieve Goals: Good ADL Goals Pt Will Perform Lower Body Bathing: with min assist;with adaptive equipment;sit to/from stand Pt Will Perform Lower Body Dressing: with contact guard assist;with adaptive equipment;sit to/from stand Pt Will Transfer to Toilet: with modified independence;ambulating;grab bars Pt Will Perform Toileting - Clothing Manipulation and hygiene: with contact guard assist;sit to/from stand Pt/caregiver will Perform Home Exercise Program: Increased strength;Both right and left upper extremity;With theraband;With theraputty;With Supervision;With written HEP provided Additional ADL Goal #1: Patient will demonstrate ability to Independently state 4 energy conservation techniques to increase safety and independence with functional tasks in the home.  Plan      Co-evaluation                 AM-PAC OT "6 Clicks" Daily Activity     Outcome Measure   Help from another person eating meals?: A Little Help from another person taking care of personal grooming?: A Little Help from another person toileting, which includes using toliet, bedpan, or urinal?: A Lot Help  from another person bathing (including washing, rinsing, drying)?: A Lot Help from another person to put on and taking off regular upper body clothing?: A Lot Help from another person to put on and taking off regular lower body clothing?: A Lot 6 Click Score: 14    End of Session Equipment Utilized During Treatment: Rolling walker (2 wheels);Gait belt;Oxygen  OT Visit Diagnosis: Muscle weakness (generalized) (M62.81);Other (comment);Other abnormalities of gait and mobility (R26.89)   Activity Tolerance Treatment limited secondary to medical complications (Comment) (HR elevated)   Patient Left with call bell/phone within reach;with family/visitor present;in chair;with chair alarm set   Nurse Communication Mobility status        Time: 8413-2440 OT Time Calculation (min): 24 min  Charges: OT General Charges $OT Visit: 1 Visit OT Treatments $Self Care/Home Management : 8-22 mins $Therapeutic Activity: 8-22 mins  Carver Fila, OTD, OTR/L SecureChat Preferred Acute Rehab [phone]   Carver Fila Koonce 09/04/2022, 9:50 AM

## 2022-09-04 NOTE — Progress Notes (Signed)
Patient ID: Gloria Sharp, female   DOB: 14-Mar-1945, 77 y.o.   MRN: [id number]     Advanced Heart Failure Rounding Note  PCP-Cardiologist: Gypsy Balsam, MD   Subjective:    Co-ox 76%. CVP 4   On cefepime for UTI  Overall feeling better. Breathing improved. Denies CP. Only complaint is positional dizziness and with walking. Denies syncope/ near syncope.   SBPs 110s-120s. SCr 0.88    Echo: EF 25%, RV normal, moderate MR, moderate low flow/low gradient AS with AVA 1.1 cm^2, IVC dilated  cMRI  IMPRESSION: 1. Normal LV size with mild LV hypertrophy. Diffuse hypokinesis appearing worse in the inferolateral wall, septal-lateral dyssynchrony consistent with LBBB, LV EF calculated to be 36%.   2.  Normal RV size with EF 37%.   3. Thickened and trileaflet aortic valve. Visually moderate aortic stenosis. Would confirm with echo doppler.   4. Visually at least mild mitral regurgitation but regurgitant fraction calculation was only 4%.   5. Delayed enhancement showed subendocardial >50% wall thickness LGE in the basal to mid inferolateral wall. This is a coronary-type pattern and suggests possible prior MI involving LCx territory.   6.  Normal extracellular volume percentage.  Objective:   Weight Range: 93.8 kg Body mass index is 40.39 kg/m.   Vital Signs:   Temp:  [97.6 F (36.4 C)-98.3 F (36.8 C)] 98 F (36.7 C) (08/20 0749) Pulse Rate:  [78-103] 94 (08/20 0749) Resp:  [10-22] 16 (08/20 0749) BP: (99-147)/(45-86) 119/45 (08/20 0749) SpO2:  [92 %-98 %] 95 % (08/20 0749) Weight:  [93.8 kg] 93.8 kg (08/20 0558) Last BM Date : 09/03/22  Weight change: Filed Weights   09/02/22 0329 09/03/22 0640 09/04/22 0558  Weight: 93.7 kg 95 kg 93.8 kg    Intake/Output:   Intake/Output Summary (Last 24 hours) at 09/04/2022 1017 Last data filed at 09/04/2022 0857 Gross per 24 hour  Intake 938.45 ml  Output --  Net 938.45 ml      Physical Exam     General:  Well  appearing. No respiratory difficulty HEENT: normal Neck: supple. no JVD. Carotids 2+ bilat; no bruits. No lymphadenopathy or thyromegaly appreciated. Cor: PMI nondisplaced. Regular rate & rhythm. No rubs, gallops or murmurs. Lungs: clear Abdomen: soft, nontender, nondistended. No hepatosplenomegaly. No bruits or masses. Good bowel sounds. Extremities: no cyanosis, clubbing, rash, edema Neuro: alert & oriented x 3, cranial nerves grossly intact. moves all 4 extremities w/o difficulty. Affect pleasant.   Telemetry   NSR 80s (personally reviewed)   Labs    CBC Recent Labs    09/03/22 0428 09/04/22 0535  WBC 11.3* 11.0*  HGB 9.4* 9.0*  HCT 29.8* 28.3*  MCV 85.1 85.2  PLT 343 323   Basic Metabolic Panel Recent Labs    78/29/56 0428 09/04/22 0535  NA 136 137  K 3.6 3.8  CL 99 105  CO2 25 23  GLUCOSE 142* 123*  BUN 37* 32*  CREATININE 1.29* 0.88  CALCIUM 8.4* 8.3*  MG 1.6* 1.9   Liver Function Tests Recent Labs    09/03/22 0428  AST 40  ALT 34  ALKPHOS 63  BILITOT 0.5  PROT 5.5*  ALBUMIN 2.6*   No results for input(s): "LIPASE", "AMYLASE" in the last 72 hours. Cardiac Enzymes No results for input(s): "CKTOTAL", "CKMB", "CKMBINDEX", "TROPONINI" in the last 72 hours.  BNP: BNP (last 3 results) Recent Labs    08/30/22 0322  BNP 458.3*    ProBNP (last 3 results) No  results for input(s): "PROBNP" in the last 8760 hours.   D-Dimer No results for input(s): "DDIMER" in the last 72 hours. Hemoglobin A1C No results for input(s): "HGBA1C" in the last 72 hours. Fasting Lipid Panel Recent Labs    09/03/22 0428  TRIG 189*   Thyroid Function Tests No results for input(s): "TSH", "T4TOTAL", "T3FREE", "THYROIDAB" in the last 72 hours.  Invalid input(s): "FREET3"  Other results:   Imaging    EP STUDY  Result Date: 09/03/2022 See surgical note for result.    Medications:     Scheduled Medications:  atorvastatin  80 mg Oral Daily   brimonidine   1 drop Both Eyes BID   Chlorhexidine Gluconate Cloth  6 each Topical Daily   clopidogrel  75 mg Oral Daily   enoxaparin (LOVENOX) injection  40 mg Subcutaneous QHS   famotidine  20 mg Oral Daily   fenofibrate  160 mg Oral Daily   gabapentin  100 mg Oral TID   insulin aspart  0-15 Units Subcutaneous TID WC   insulin aspart  0-5 Units Subcutaneous QHS   pantoprazole  40 mg Oral Daily   sacubitril-valsartan  1 tablet Oral BID   sertraline  50 mg Oral Daily   sodium chloride flush  3 mL Intravenous Q12H   spironolactone  25 mg Oral Daily    Infusions:  sodium chloride      PRN Medications: sodium chloride, acetaminophen, diclofenac Sodium, docusate, ipratropium-albuterol, ondansetron (ZOFRAN) IV, mouth rinse, phenol, polyethylene glycol, sodium chloride flush, tiZANidine   Assessment/Plan   1. Shock: Resolved.  Thought to be primarily septic shock from UTI, on cefepime.  2. ID: UTI. No culture data.  - Continue cefepime x 5 days  3. Acute systolic CHF: Prior echo with normal EF in 5/24. Echo this admission with EF 25%, RV normal, moderate MR, moderate low flow/low gradient AS with AVA 1.1 cm^2, IVC dilated.  HS-TnI elevated to 1948 but cath did not show culprit lesion for NSTEMI.  There was moderate diffuse CAD on cath that did not explain fall in EF.   cMRI not suggestive of infiltrative disease/myocarditis. Patient has LBBB but this appears to be chronic.  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 She has diuresed well. Euvolemic on exam. CVP 4. Co-ox 76%. BP and renal fx stable w/ GDMT but pt reports positional dizziness.  - Continue Entresto 24/26 bid. Chest orthostatics. If + may need switch to losartan  - Continue Spironolactone 25 mg daily.  - Eventual beta blocker.  - No Marcelline Deist yet with UTI this admission.  - will need PRN loop diuretic at d/c  - will need repeat echo in 3 months after meds optimized. If EF remains < 35% will need to be considered for CRT  device.  4. Aortic stenosis: Looks like moderate low flow/low gradient aortic stenosis by TEE. Will need to continue to follow as outpatient. Referral to structural heart clinic  5. Mitral regurgitation:  moderate functional MR by TTE. Appears only mild by TEE - continue HF optimization per above  6. CAD: Moderate multivessel disease on cath this admission, does not explain fall in EF.  No culprit lesion to treat.  - Continue Plavix + statin.  7. Anemia: Fe deficient with transferrin saturation 5% - give dose IV Fe.  8. AKI: resolved. SCr back to baseline < 1.   Check orthostatics today help finalize GDMT regimen planning for discharge. If stable, can possibly go home later today or tomorrow morning.  Length of Stay: [address], PA-C  09/04/2022, 10:17 AM  Advanced Heart Failure Team Pager [phone] (M-F; 7a - 5p)  Please contact [facility] for night-coverage after hours (5p -7a ) and weekends on [email or web address]

## 2022-09-04 NOTE — Plan of Care (Signed)

## 2022-09-04 NOTE — Progress Notes (Signed)
PROGRESS NOTE  Gloria Sharp GLO:[id number] DOB: [birth year] DOA: 08/30/2022 PCP: Abner Greenspan, MD   LOS: 5 days   Brief Narrative / Interim history: 77 y/o woman with a history of DM, HTN, FM, IBS who presented after having sudden onset SOB tonight. She had just gotten back in bed from going to the bathroom when she told her husband that she couldn't breathe and wanted him to call EMS. When the fire department got there she was unconscious and ineffectively breathing so they started BVM until EMS arrived. She remained unconscious requiring BVM all the way to the hospital. She was intubated in the ED. She has no history of blood clots or MI. She has known murmurs and 2-vessel CAD since LHC in 05/2022. She is on Plavix chronically but no other blood thinners.   Subjective / 24h Interval events: She denies any chest pain. She got very dizzy with walking and had to sit down.   Assesement and Plan: Principal Problem:   Acute respiratory failure with hypoxia and hypercapnia (HCC) Active Problems:   Syncope and collapse   Respiratory arrest [facility])  Principal problem Presumed septic shock in the setting of UTI -initially admitted to the ICU, now off pressors and transferred to the hospitalist service 8/18. She is s/p 5 days of cefepime. Cultures have all remain negative  Active problems Acute hypoxic and hypercarbic respiratory failure in the setting of acute pulmonary edema due to acute systolic CHF -2D echo done on 1/88 shows an EF of 25%, global hypokinesis, grade 1 diastolic dysfunction.  CHF team consulted and following -Diuresed well with furosemide and currently she appears euvolemic.   -GDMT as per cardiology but still dizzy this morning.  Obtain orthostatics, may need further adjustments, defer to cardiology -Cardiac MRI negative for infiltrative disease, but does suggest possible prior MI in the LCx territory -Cardiac cath with multivessel disease without acute occlusions, medical  management recommended -TEE showing moderate AAS and mild MR, medical management recommended  AKI-due to Lasix, also symptomatic with mild intravascular depletion.  Creatinine has now stabilized and she is tolerating spironolactone well.  Lasix may need to be PRN upon discharge  CAD -most recent cardiac cath May 2024 with known borderline LAD lesion with a long 65-70% mid LAD lesion and mid left circumflex.  Medical management -Troponin elevation noted  Elevated LFTs -in the setting of #1, now normalized  Hypokalemia-replete and continue to monitor  AKI on CKD 3A -creatinine returned to baseline  Normocytic anemia-monitor hemoglobin  Scheduled Meds:  atorvastatin  80 mg Oral Daily   brimonidine  1 drop Both Eyes BID   Chlorhexidine Gluconate Cloth  6 each Topical Daily   clopidogrel  75 mg Oral Daily   enoxaparin (LOVENOX) injection  40 mg Subcutaneous QHS   famotidine  20 mg Oral Daily   fenofibrate  160 mg Oral Daily   gabapentin  100 mg Oral TID   insulin aspart  0-15 Units Subcutaneous TID WC   insulin aspart  0-5 Units Subcutaneous QHS   pantoprazole  40 mg Oral Daily   sacubitril-valsartan  1 tablet Oral BID   sertraline  50 mg Oral Daily   sodium chloride flush  3 mL Intravenous Q12H   spironolactone  25 mg Oral Daily   Continuous Infusions:  sodium chloride     iron sucrose 500 mg (09/04/22 1125)   PRN Meds:.sodium chloride, acetaminophen, diclofenac Sodium, docusate, ipratropium-albuterol, ondansetron (ZOFRAN) IV, mouth rinse, phenol, polyethylene glycol, sodium chloride flush, tiZANidine  Current Outpatient Medications  Medication Instructions   acetaminophen (TYLENOL) 650 mg, Oral, Daily PRN   alendronate (FOSAMAX) 35 mg, Oral, Every 7 days   amLODipine (NORVASC) 10 mg, Oral, Daily   atorvastatin (LIPITOR) 80 mg, Oral, Daily   brimonidine (ALPHAGAN) 0.15 % ophthalmic solution 1 drop, Both Eyes, 2 times daily   carvedilol (COREG) 12.5 mg, Oral, 2 times daily  with meals   cholestyramine (QUESTRAN) 4 g, Oral, Daily, Take at least 2 hours before or after rest of the medications   clopidogrel (PLAVIX) 75 mg, Oral, Daily   Cyanocobalamin (VITAMIN B-12 PO) 1 tablet, Oral, Daily   diclofenac Sodium (VOLTAREN) 2 g, Topical, At bedtime PRN   fenofibrate (TRICOR) 145 mg, Oral, Daily   ferrous sulfate 325 mg, Oral, Daily   furosemide (LASIX) 20 mg, Oral, Daily   gabapentin (NEURONTIN) 300 mg, Oral, 3 times daily   Infant Care Products (DERMACLOUD) OINT 1 application , Topical, 2 times daily PRN   irbesartan (AVAPRO) 150 mg, Oral, Daily   MAGNESIUM PO 1 tablet, Oral, Daily   metFORMIN (GLUCOPHAGE) 1,000 mg, Oral, 2 times daily with meals   Omega-3 Fatty Acids (FISH OIL PO) 1 capsule, Oral, Daily   OVER THE COUNTER MEDICATION 1 patch, Topical, Daily PRN, [place] pain patch   pantoprazole (PROTONIX) 40 mg, Oral, Daily   potassium chloride SA (KLOR-CON M) 20 MEQ tablet 40 mEq, Oral, Daily   sertraline (ZOLOFT) 50 mg, Oral, Daily   tiZANidine (ZANAFLEX) 2 mg, Oral, Every 6 hours PRN   TURMERIC PO 1 tablet, Oral, Daily    Diet Orders (From admission, onward)     Start     Ordered   09/03/22 1437  Diet Heart Room service appropriate? Yes; Fluid consistency: Thin  Diet effective now       Question Answer Comment  Room service appropriate? Yes   Fluid consistency: Thin      09/03/22 1438            DVT prophylaxis: enoxaparin (LOVENOX) injection 40 mg Start: 08/31/22 2200 SCDs Start: 08/30/22 0507   Lab Results  Component Value Date   PLT 323 09/04/2022      Code Status: Full Code  Family Communication: Husband and daughter at bedside  Status is: Inpatient Remains inpatient appropriate because: severity of illness  Level of care: Progressive  Consultants:  Cardiology  PCCM  Objective: Vitals:   09/04/22 0303 09/04/22 0558 09/04/22 0749 09/04/22 1118  BP: (!) 123/51  (!) 119/45 (!) 118/58  Pulse: 82  94 83  Resp: 16  16 17    Temp: 97.9 F (36.6 C)  98 F (36.7 C) 98.2 F (36.8 C)  TempSrc: Oral  Oral Oral  SpO2: 93%  95% 97%  Weight:  93.8 kg    Height:        Intake/Output Summary (Last 24 hours) at 09/04/2022 1147 Last data filed at 09/04/2022 1121 Gross per 24 hour  Intake 1058.45 ml  Output --  Net 1058.45 ml   Wt Readings from Last 3 Encounters:  09/04/22 93.8 kg  05/24/22 95.3 kg  05/15/22 96.3 kg    Examination:  Constitutional: NAD Eyes: lids and conjunctivae normal, no scleral icterus ENMT: mmm Neck: normal, supple Respiratory: clear to auscultation bilaterally, no wheezing, no crackles.  Cardiovascular: Regular rate and rhythm, no murmurs / rubs / gallops. No LE edema. Abdomen: soft, no distention, no tenderness. Bowel sounds positive.   Data Reviewed: I have independently reviewed following labs and  imaging studies   CBC Recent Labs  Lab 08/30/22 0322 08/30/22 0450 08/30/22 1258 08/31/22 0412 09/01/22 0511 09/03/22 0428 09/04/22 0535  WBC 16.2*  --   --  17.0* 14.1* 11.3* 11.0*  HGB 10.9*  12.6   < > 9.9* 8.5* 8.1* 9.4* 9.0*  HCT 36.5  37.0   < > 29.0* 26.8* 25.3* 29.8* 28.3*  PLT 517*  --   --  365 277 343 323  MCV 89.7  --   --  86.2 85.2 85.1 85.2  MCH 26.8  --   --  27.3 27.3 26.9 27.1  MCHC 29.9*  --   --  31.7 32.0 31.5 31.8  RDW 14.7  --   --  15.1 14.9 14.5 14.4  LYMPHSABS 3.0  --   --   --  1.5  --   --   MONOABS 1.2*  --   --   --  1.2*  --   --   EOSABS 0.2  --   --   --  0.5  --   --   BASOSABS 0.1  --   --   --  0.0  --   --    < > = values in this interval not displayed.    Recent Labs  Lab 08/30/22 0322 08/30/22 0324 08/30/22 0450 08/30/22 0828 08/30/22 1249 08/31/22 1111 08/31/22 2025 09/01/22 0511 09/01/22 1143 09/01/22 1655 09/02/22 0450 09/03/22 0428 09/04/22 0535  NA 135  139  --    < >  --    < > 138 139 137 137 137 139 136 137  K 4.7  4.7  --    < >  --    < > 4.2 3.3* 3.8 3.8 3.8 3.5 3.6 3.8  CL 106  105  --   --   --    <  > 107 106 106 102 99 98 99 105  CO2 15*  --   --   --    < > 20* 22 25 25 23 26 25 23   GLUCOSE 295*  300*  --   --   --    < > 144* 113* 112* 111* 160* 125* 142* 123*  BUN 25*  27*  --   --   --    < > 26* 23 21 22 20  24* 37* 32*  CREATININE 1.07*  1.20*  --   --   --    < > 1.08* 1.07* 1.11* 0.94 0.90 0.94 1.29* 0.88  CALCIUM 8.6*  --   --   --    < > 8.0* 8.0* 8.3* 9.1 9.1 9.3 8.4* 8.3*  AST 42*  --   --   --   --  35 37  --   --   --   --  40  --   ALT 28  --   --   --   --  24 23  --   --   --   --  34  --   ALKPHOS 75  --   --   --   --  57 60  --   --   --   --  63  --   BILITOT 0.7  --   --   --   --  0.8 1.2  --   --   --   --  0.5  --   ALBUMIN 3.3*  --   --   --   --  2.7* 2.6*  --   --   --   --  2.6*  --   MG  --   --   --   --    < >  --  1.7 2.2 1.9  --   --  1.6* 1.9  LATICACIDVEN  --  4.0*  --  1.1  --   --   --   --   --   --   --   --   --   INR 1.3*  --   --   --   --   --   --   --   --   --   --   --   --   BNP 458.3*  --   --   --   --   --   --   --   --   --   --   --   --    < > = values in this interval not displayed.    ------------------------------------------------------------------------------------------------------------------ Recent Labs    09/03/22 0428  TRIG 189*    Lab Results  Component Value Date   HGBA1C 5.9 (H) 05/15/2022   ------------------------------------------------------------------------------------------------------------------ No results for input(s): "TSH", "T4TOTAL", "T3FREE", "THYROIDAB" in the last 72 hours.  Invalid input(s): "FREET3"  Cardiac Enzymes No results for input(s): "CKMB", "TROPONINI", "MYOGLOBIN" in the last 168 hours.  Invalid input(s): "CK" ------------------------------------------------------------------------------------------------------------------    Component Value Date/Time   BNP 458.3 (H) 08/30/2022 0322    CBG: Recent Labs  Lab 09/03/22 1615 09/03/22 2119 09/04/22 0610 09/04/22 0748  09/04/22 1119  GLUCAP 170* 144* 151* 236* 194*    Recent Results (from the past 240 hour(s))  Blood culture (routine x 2)     Status: None   Collection Time: 08/30/22  3:18 AM   Specimen: BLOOD RIGHT HAND  Result Value Ref Range Status   Specimen Description BLOOD RIGHT HAND  Final   Special Requests   Final    BOTTLES DRAWN AEROBIC AND ANAEROBIC Blood Culture adequate volume   Culture   Final    NO GROWTH 5 DAYS Performed at [facility], [address], [place], [place] [address]    Report Status 09/04/2022 FINAL  Final  Blood culture (routine x 2)     Status: None   Collection Time: 08/30/22  3:22 AM   Specimen: BLOOD  Result Value Ref Range Status   Specimen Description BLOOD RIGHT ANTECUBITAL  Final   Special Requests   Final    BOTTLES DRAWN AEROBIC AND ANAEROBIC Blood Culture adequate volume   Culture   Final    NO GROWTH 5 DAYS Performed at [facility], [address], [place], [place] [address]    Report Status 09/04/2022 FINAL  Final  Resp panel by RT-PCR (RSV, Flu A&B, Covid) Anterior Nasal Swab     Status: None   Collection Time: 08/30/22  5:00 AM   Specimen: Anterior Nasal Swab  Result Value Ref Range Status   SARS Coronavirus 2 by RT PCR NEGATIVE NEGATIVE Final   Influenza A by PCR NEGATIVE NEGATIVE Final   Influenza B by PCR NEGATIVE NEGATIVE Final    Comment: (NOTE) The Xpert Xpress SARS-CoV-2/FLU/RSV plus assay is intended as an aid in the diagnosis of influenza from Nasopharyngeal swab specimens and should not be used as a sole basis for treatment. Nasal washings and aspirates are unacceptable for Xpert Xpress SARS-CoV-2/FLU/RSV testing.  Fact Sheet for  Patients: BloggerCourse.com  Fact Sheet for Healthcare Providers: SeriousBroker.it  This test is not yet approved or cleared by the [place] FDA and has been authorized for detection and/or diagnosis of SARS-CoV-2 by FDA under an  Emergency Use Authorization (EUA). This EUA will remain in effect (meaning this test can be used) for the duration of the COVID-19 declaration under Section 564(b)(1) of the Act, 21 U.S.C. section 360bbb-3(b)(1), unless the authorization is terminated or revoked.     Resp Syncytial Virus by PCR NEGATIVE NEGATIVE Final    Comment: (NOTE) Fact Sheet for Patients: BloggerCourse.com  Fact Sheet for Healthcare Providers: SeriousBroker.it  This test is not yet approved or cleared by the [place] FDA and has been authorized for detection and/or diagnosis of SARS-CoV-2 by FDA under an Emergency Use Authorization (EUA). This EUA will remain in effect (meaning this test can be used) for the duration of the COVID-19 declaration under Section 564(b)(1) of the Act, 21 U.S.C. section 360bbb-3(b)(1), unless the authorization is terminated or revoked.  Performed at [facility], [address], [place], [place] [address]   MRSA Next Gen by PCR, Nasal     Status: None   Collection Time: 08/30/22  6:30 AM   Specimen: Nasal Mucosa; Nasal Swab  Result Value Ref Range Status   MRSA by PCR Next Gen NOT DETECTED NOT DETECTED Final    Comment: (NOTE) The GeneXpert MRSA Assay (FDA approved for NASAL specimens only), is one component of a comprehensive MRSA colonization surveillance program. It is not intended to diagnose MRSA infection nor to guide or monitor treatment for MRSA infections. Test performance is not FDA approved in patients less than 49 years old. Performed at [facility], [address], [place], [place] [address]      Radiology Studies: EP STUDY  Result Date: 09/03/2022 See surgical note for result.    Pamella Pert, MD, PhD [facility]  Between 7 am - 7 pm I am available, please contact me via Amion (for emergencies) or Securechat (non urgent messages)  Between 7 pm - 7 am I am not available,  please contact night coverage MD/APP via Amion

## 2022-09-04 NOTE — Progress Notes (Signed)
Physical Therapy Treatment Patient Details Name: Gloria Sharp MRN: [id number] DOB: 1945-11-19 Today's Date: 09/04/2022   History of Present Illness Pt is 77 year old presented to [facility] 08/30/22  after having sudden onset SOB tonight. Intubated in the ED;  acute pulmonary edema, shock, encephalopathy, Troponin elevated and trend c/w NSTEMI, extubated 08/31/22, echo with EF 25%, cardiac cath  with moderate 2-vessel disease PMH - chronic/rt leg pain, DM, HTN, CVA, aortic stenosis, fibromyalgia, IBS, asthma    PT Comments  Pt received in supine and agreeable to session with husband present and supportive throughout. Orthostatic vitals taken with a drop once sitting EOB, but WFL in standing and pt asymptomatic.  Pt requesting to use the bathroom and is able to void, but requires assist for pericare. Pt able to stand from multiple surfaces with min guard and tolerated a gait trial this session, however requires multiple seated rest breaks due to back pain and elevated HR. Pt continues to benefit from PT services to progress toward functional mobility goals.    If plan is discharge home, recommend the following: A little help with walking and/or transfers;A little help with bathing/dressing/bathroom;Assistance with cooking/housework;Assist for transportation;Help with stairs or ramp for entrance   Can travel by private vehicle        Equipment Recommendations  None recommended by PT    Recommendations for Other Services       Precautions / Restrictions Precautions Precautions: Fall;Other (comment) Precaution Comments: watch HR and BP Restrictions Weight Bearing Restrictions: No     Mobility  Bed Mobility Overal bed mobility: Needs Assistance Bed Mobility: Supine to Sit, Sit to Supine     Supine to sit: Contact guard, Used rails, HOB elevated Sit to supine: Contact guard assist, HOB elevated, Mod assist   General bed mobility comments: increased time and effort. Mod A to lateral scoot in  supine and supine scoot towards HOB    Transfers Overall transfer level: Needs assistance Equipment used: Rolling walker (2 wheels) Transfers: Sit to/from Stand Sit to Stand: Contact guard assist           General transfer comment: From EOB x2, toilet, and chair x3 with CGA for safety and cues for safe hand placement each time    Ambulation/Gait Ambulation/Gait assistance: Contact guard assist Gait Distance (Feet): 50 Feet (+10) Assistive device: Rolling walker (2 wheels) Gait Pattern/deviations: Step-through pattern, Trunk flexed, Decreased stride length       General Gait Details: Pt demonstrating mildly unsteady gait with RW support and flexed trunk due to back pain. Pt requiring 3 seated rest breaks due to back pain. HR up to 129 and improving with seated breaks      Balance Overall balance assessment: Needs assistance Sitting-balance support: No upper extremity supported, Feet supported Sitting balance-Leahy Scale: Good Sitting balance - Comments: sitting EOB   Standing balance support: Bilateral upper extremity supported, During functional activity, Reliant on assistive device for balance Standing balance-Leahy Scale: Poor Standing balance comment: reliant on RW support                            Cognition Arousal: Alert Behavior During Therapy: WFL for tasks assessed/performed Overall Cognitive Status: Within Functional Limits for tasks assessed  Exercises      General Comments General comments (skin integrity, edema, etc.): HR up to 129 with ambulation and improving with seated rest breaks. BP 125/50 in supine; 91/72 sitting EOB, and 107/65 in standing. Pt reporting no dizziness.      Pertinent Vitals/Pain Pain Assessment Pain Assessment: Faces Faces Pain Scale: Hurts even more Pain Location: back in standing Pain Descriptors / Indicators: Discomfort, Grimacing Pain Intervention(s):  Limited activity within patient's tolerance, Monitored during session, Repositioned     PT Goals (current goals can now be found in the care plan section) Acute Rehab PT Goals Patient Stated Goal: "get back to my usual self" PT Goal Formulation: With patient Time For Goal Achievement: 09/15/22 Potential to Achieve Goals: Good Progress towards PT goals: Progressing toward goals    Frequency    Min 1X/week      PT Plan         AM-PAC PT "6 Clicks" Mobility   Outcome Measure  Help needed turning from your back to your side while in a flat bed without using bedrails?: None Help needed moving from lying on your back to sitting on the side of a flat bed without using bedrails?: A Little Help needed moving to and from a bed to a chair (including a wheelchair)?: A Little Help needed standing up from a chair using your arms (e.g., wheelchair or bedside chair)?: A Little Help needed to walk in hospital room?: A Little Help needed climbing 3-5 steps with a railing? : Total 6 Click Score: 17    End of Session Equipment Utilized During Treatment: Gait belt Activity Tolerance: Patient limited by fatigue;Patient limited by pain Patient left: in bed;with family/visitor present;with call bell/phone within reach Nurse Communication: Mobility status PT Visit Diagnosis: Muscle weakness (generalized) (M62.81);Difficulty in walking, not elsewhere classified (R26.2)     Time: 2952-8413 PT Time Calculation (min) (ACUTE ONLY): 31 min  Charges:    $Gait Training: 8-22 mins $Therapeutic Activity: 8-22 mins PT General Charges $$ ACUTE PT VISIT: 1 Visit                     Johny Shock, PTA Acute Rehabilitation Services Secure Chat Preferred  [phone]    Johny Shock 09/04/2022, 1:36 PM

## 2022-09-05 ENCOUNTER — Other Ambulatory Visit (HOSPITAL_COMMUNITY): Payer: Self-pay

## 2022-09-05 DIAGNOSIS — J9601 Acute respiratory failure with hypoxia: Secondary | ICD-10-CM | POA: Diagnosis not present

## 2022-09-05 DIAGNOSIS — J9602 Acute respiratory failure with hypercapnia: Secondary | ICD-10-CM | POA: Diagnosis not present

## 2022-09-05 LAB — COMPREHENSIVE METABOLIC PANEL
ALT: 30 U/L (ref 0–44)
AST: 29 U/L (ref 15–41)
Albumin: 2.9 g/dL — ABNORMAL LOW (ref 3.5–5.0)
Alkaline Phosphatase: 64 U/L (ref 38–126)
Anion gap: 10 (ref 5–15)
BUN: 18 mg/dL (ref 8–23)
CO2: 24 mmol/L (ref 22–32)
Calcium: 9.2 mg/dL (ref 8.9–10.3)
Chloride: 103 mmol/L (ref 98–111)
Creatinine, Ser: 0.85 mg/dL (ref 0.44–1.00)
GFR, Estimated: >60 mL/min (ref >60)
Glucose, Bld: 143 mg/dL — ABNORMAL HIGH (ref 70–99)
Potassium: 3.5 mmol/L (ref 3.5–5.1)
Sodium: 137 mmol/L (ref 135–145)
Total Bilirubin: 0.7 mg/dL (ref 0.3–1.2)
Total Protein: 6.1 g/dL — ABNORMAL LOW (ref 6.5–8.1)

## 2022-09-05 LAB — GLUCOSE, CAPILLARY
Glucose-Capillary: 146 mg/dL — ABNORMAL HIGH (ref 70–99)
Glucose-Capillary: 154 mg/dL — ABNORMAL HIGH (ref 70–99)
Glucose-Capillary: 140 mg/dL — ABNORMAL HIGH (ref 70–99)

## 2022-09-05 LAB — CBC
HCT: 30.2 % — ABNORMAL LOW (ref 36.0–46.0)
Hemoglobin: 9.4 g/dL — ABNORMAL LOW (ref 12.0–15.0)
MCH: 26.7 pg (ref 26.0–34.0)
MCHC: 31.1 g/dL (ref 30.0–36.0)
MCV: 85.8 fL (ref 80.0–100.0)
Platelets: 357 10*3/uL (ref 150–400)
RBC: 3.52 MIL/uL — ABNORMAL LOW (ref 3.87–5.11)
RDW: 14.5 % (ref 11.5–15.5)
WBC: 10.5 10*3/uL (ref 4.0–10.5)
nRBC: 0 % (ref 0.0–0.2)

## 2022-09-05 LAB — COOXEMETRY PANEL
Carboxyhemoglobin: 1.1 % (ref 0.5–1.5)
Methemoglobin: 1 % (ref 0.0–1.5)
O2 Saturation: 64.5 %
Total hemoglobin: 8.9 g/dL — ABNORMAL LOW (ref 12.0–16.0)

## 2022-09-05 LAB — MAGNESIUM: Magnesium: 1.6 mg/dL — ABNORMAL LOW (ref 1.7–2.4)

## 2022-09-05 MED ORDER — MAGNESIUM SULFATE 2 GM/50ML IV SOLN
2.0000 g | Freq: Once | INTRAVENOUS | Status: AC
  Administered 2022-09-05: 2 g via INTRAVENOUS
  Filled 2022-09-05: qty 50

## 2022-09-05 MED ORDER — SPIRONOLACTONE 25 MG PO TABS
25.0000 mg | ORAL_TABLET | Freq: Every day | ORAL | 0 refills | Status: DC
  Filled 2022-09-05: qty 60, 60d supply, fill #0

## 2022-09-05 MED ORDER — SACUBITRIL-VALSARTAN 24-26 MG PO TABS
1.0000 | ORAL_TABLET | Freq: Two times a day (BID) | ORAL | 0 refills | Status: DC
Start: 2022-09-05 — End: 2022-12-03
  Filled 2022-09-05: qty 60, 30d supply, fill #0

## 2022-09-05 NOTE — Plan of Care (Signed)
  Problem: Education: Goal: Knowledge of General Education information will improve Description: Including pain rating scale, medication(s)/side effects and non-pharmacologic comfort measures Outcome: Adequate for Discharge   Problem: Health Behavior/Discharge Planning: Goal: Ability to manage health-related needs will improve Outcome: Adequate for Discharge   Problem: Clinical Measurements: Goal: Ability to maintain clinical measurements within normal limits will improve Outcome: Adequate for Discharge Goal: Will remain free from infection Outcome: Adequate for Discharge Goal: Diagnostic test results will improve Outcome: Adequate for Discharge Goal: Respiratory complications will improve Outcome: Adequate for Discharge Goal: Cardiovascular complication will be avoided Outcome: Adequate for Discharge   Problem: Activity: Goal: Risk for activity intolerance will decrease Outcome: Adequate for Discharge   Problem: Nutrition: Goal: Adequate nutrition will be maintained Outcome: Adequate for Discharge   Problem: Coping: Goal: Level of anxiety will decrease Outcome: Adequate for Discharge   Problem: Elimination: Goal: Will not experience complications related to bowel motility Outcome: Adequate for Discharge Goal: Will not experience complications related to urinary retention Outcome: Adequate for Discharge   Problem: Pain Managment: Goal: General experience of comfort will improve Outcome: Adequate for Discharge   Problem: Safety: Goal: Ability to remain free from injury will improve Outcome: Adequate for Discharge   Problem: Skin Integrity: Goal: Risk for impaired skin integrity will decrease Outcome: Adequate for Discharge   Problem: Education: Goal: Understanding of CV disease, CV risk reduction, and recovery process will improve Outcome: Adequate for Discharge Goal: Individualized Educational Video(s) Outcome: Adequate for Discharge   Problem:  Activity: Goal: Ability to return to baseline activity level will improve Outcome: Adequate for Discharge   Problem: Cardiovascular: Goal: Ability to achieve and maintain adequate cardiovascular perfusion will improve Outcome: Adequate for Discharge Goal: Vascular access site(s) Level 0-1 will be maintained Outcome: Adequate for Discharge   Problem: Health Behavior/Discharge Planning: Goal: Ability to safely manage health-related needs after discharge will improve Outcome: Adequate for Discharge   Problem: Education: Goal: Ability to describe self-care measures that may prevent or decrease complications (Diabetes Survival Skills Education) will improve Outcome: Adequate for Discharge Goal: Individualized Educational Video(s) Outcome: Adequate for Discharge   Problem: Coping: Goal: Ability to adjust to condition or change in health will improve Outcome: Adequate for Discharge   Problem: Fluid Volume: Goal: Ability to maintain a balanced intake and output will improve Outcome: Adequate for Discharge   Problem: Health Behavior/Discharge Planning: Goal: Ability to identify and utilize available resources and services will improve Outcome: Adequate for Discharge Goal: Ability to manage health-related needs will improve Outcome: Adequate for Discharge   Problem: Metabolic: Goal: Ability to maintain appropriate glucose levels will improve Outcome: Adequate for Discharge   Problem: Nutritional: Goal: Maintenance of adequate nutrition will improve Outcome: Adequate for Discharge Goal: Progress toward achieving an optimal weight will improve Outcome: Adequate for Discharge   Problem: Skin Integrity: Goal: Risk for impaired skin integrity will decrease Outcome: Adequate for Discharge   Problem: Tissue Perfusion: Goal: Adequacy of tissue perfusion will improve Outcome: Adequate for Discharge

## 2022-09-05 NOTE — Progress Notes (Signed)
Physical Therapy Treatment Patient Details Name: Gloria Sharp MRN: [id number] DOB: 03/06/1945 Today's Date: 09/05/2022   History of Present Illness Pt is 77 year old presented to [facility] 08/30/22  after having sudden onset SOB tonight. Intubated in the ED;  acute pulmonary edema, shock, encephalopathy, Troponin elevated and trend c/w NSTEMI, extubated 08/31/22, echo with EF 25%, cardiac cath  with moderate 2-vessel disease PMH - chronic/rt leg pain, DM, HTN, CVA, aortic stenosis, fibromyalgia, IBS, asthma    PT Comments  Pt received in bathroom and agreeable to session. Pt able to perform mobility tasks with up to CGA for safety, however continues to be limited by impaired activity tolerance and elevated HR with mobility. Pt requires a chair follow for increased distances due to chronic nerve pain requiring frequent seated rest breaks. Education provided on safe navigation of home set up and activity progression with pt and husband verbalizing understanding. Pt continues to benefit from PT services to progress toward functional mobility goals.    If plan is discharge home, recommend the following: A little help with walking and/or transfers;A little help with bathing/dressing/bathroom;Assistance with cooking/housework;Assist for transportation;Help with stairs or ramp for entrance   Can travel by private vehicle        Equipment Recommendations  None recommended by PT    Recommendations for Other Services       Precautions / Restrictions Precautions Precautions: Fall;Other (comment) Precaution Comments: watch HR and BP Restrictions Weight Bearing Restrictions: No     Mobility  Bed Mobility               General bed mobility comments: Pt OOB upon entry    Transfers Overall transfer level: Needs assistance Equipment used: Rolling walker (2 wheels) Transfers: Sit to/from Stand Sit to Stand: Contact guard assist           General transfer comment: From toilet and chair with  CGA for safety and cues for hand placement    Ambulation/Gait Ambulation/Gait assistance: Contact guard assist Gait Distance (Feet): 50 Feet (+10) Assistive device: Rolling walker (2 wheels) Gait Pattern/deviations: Step-through pattern, Trunk flexed, Decreased stride length       General Gait Details: Pt requires 1 seated rest break due to RLE pain and HR up to 138 with mobility and improving with rest break.       Balance Overall balance assessment: Needs assistance Sitting-balance support: No upper extremity supported, Feet supported Sitting balance-Leahy Scale: Good Sitting balance - Comments: sitting EOB   Standing balance support: Bilateral upper extremity supported, During functional activity, Reliant on assistive device for balance Standing balance-Leahy Scale: Poor Standing balance comment: reliant on RW support                            Cognition Arousal: Alert Behavior During Therapy: WFL for tasks assessed/performed Overall Cognitive Status: Within Functional Limits for tasks assessed                                          Exercises      General Comments General comments (skin integrity, edema, etc.): HR up to 138 with ambulation and improving with seated rest break.      Pertinent Vitals/Pain Pain Assessment Pain Assessment: Faces Faces Pain Scale: Hurts little more Pain Location: RLE with mobility Pain Descriptors / Indicators: Discomfort, Grimacing Pain Intervention(s): Limited  activity within patient's tolerance, Monitored during session, Repositioned     PT Goals (current goals can now be found in the care plan section) Acute Rehab PT Goals Patient Stated Goal: "get back to my usual self" PT Goal Formulation: With patient Time For Goal Achievement: 09/15/22 Potential to Achieve Goals: Good Progress towards PT goals: Progressing toward goals    Frequency    Min 1X/week      PT Plan         AM-PAC PT  "6 Clicks" Mobility   Outcome Measure  Help needed turning from your back to your side while in a flat bed without using bedrails?: None Help needed moving from lying on your back to sitting on the side of a flat bed without using bedrails?: A Little Help needed moving to and from a bed to a chair (including a wheelchair)?: A Little Help needed standing up from a chair using your arms (e.g., wheelchair or bedside chair)?: A Little Help needed to walk in hospital room?: A Little Help needed climbing 3-5 steps with a railing? : Total 6 Click Score: 17    End of Session Equipment Utilized During Treatment: Gait belt Activity Tolerance: Patient limited by fatigue;Patient limited by pain Patient left: with family/visitor present;with call bell/phone within reach;in chair Nurse Communication: Mobility status PT Visit Diagnosis: Muscle weakness (generalized) (M62.81);Difficulty in walking, not elsewhere classified (R26.2)     Time: 1610-9604 PT Time Calculation (min) (ACUTE ONLY): 18 min  Charges:    $Gait Training: 8-22 mins PT General Charges $$ ACUTE PT VISIT: 1 Visit                     Johny Shock, PTA Acute Rehabilitation Services Secure Chat Preferred  [phone]    Johny Shock 09/05/2022, 10:19 AM

## 2022-09-05 NOTE — Progress Notes (Signed)
Mobility Specialist Progress Note:   09/05/22 1450  Mobility  Activity Ambulated with assistance in hallway  Level of Assistance Contact guard assist, steadying assist  Assistive Device Front wheel walker  Distance Ambulated (ft) 60 ft  Activity Response Tolerated well  Mobility Referral Yes  $Mobility charge 1 Mobility  Mobility Specialist Start Time (ACUTE ONLY) 1425  Mobility Specialist Stop Time (ACUTE ONLY) 1440  Mobility Specialist Time Calculation (min) (ACUTE ONLY) 15 min    During Mobility: 108 HR , 98% SpO2 RA Post Mobility: 90 HR , 90% SpO2 RA  Pt received in chair, agreeable to mobility. Seated rest break required d/t slight fatigue. Pt denied any SOB or pain during ambulation, asymptomatic throughout. Once returned to room pt requesting to use BR. Void successful. Pt left in chair with call bell in reach and all needs met. Family present.   Leory Plowman  Mobility Specialist Please contact via [employer] office at [phone]

## 2022-09-05 NOTE — TOC Progression Note (Signed)
Transition of Care [facility]) - Progression Note    Patient Details  Name: Gloria Sharp MRN: [id number] Date of Birth: 05/07/1945  [facility] CM/SW Contact  Elliot Cousin, RN Phone Number: [phone] 09/05/2022, 3:08 PM  Clinical Narrative:   Patient has bedside commode in the room. Husband to provide transportation home. Contacted [facility] to make aware of dc home today. Meds up from [facility] pharmacy.     Expected Discharge Plan: Home w Home Health Services Barriers to Discharge: No Barriers Identified  Expected Discharge Plan and Services   Discharge Planning Services: CM Consult Post Acute Care Choice: Home Health Living arrangements for the past 2 months: Single Family Home Expected Discharge Date: 09/05/22               DME Arranged: 3-N-1 DME Agency: [employer] Date DME Agency Contacted: 09/04/22 Time DME Agency Contacted: [phone] Representative spoke with at DME Agency: Leavy Cella HH Arranged: PT, RN HH Agency: [facility] Date [facility] Agency Contacted: 09/04/22 Time HH Agency Contacted: 1404 Representative spoke with at [facility] Agency: Hassel Neth   Social Determinants of Health (SDOH) Interventions SDOH Screenings   Food Insecurity: No Food Insecurity (05/15/2022)  Housing: Low Risk  (05/15/2022)  Transportation Needs: No Transportation Needs (05/15/2022)  Utilities: Not At Risk (05/15/2022)  Tobacco Use: Low Risk  (08/30/2022)    Readmission Risk Interventions     No data to display

## 2022-09-05 NOTE — Discharge Summary (Signed)
Physician Discharge Summary  ORIE BRENNING UEA:[id number] DOB: 02-Jul-1945 DOA: 08/30/2022  PCP: Abner Greenspan, MD  Admit date: 08/30/2022 Discharge date: 09/05/2022  Admitted From: Home Disposition:  Home  Discharge Condition:Stable CODE STATUS:FULL Diet recommendation: Heart Healthy   Brief/Interim Summary: Patient is a 77 y/o woman with a history of DM, HTN, FM, IBS who presented after having sudden onset SOB . She had just gotten back in bed from going to the bathroom when she told her husband that she couldn't breathe and wanted him to call EMS. When the fire department got there she was unconscious and ineffectively breathing so they started BVM until EMS arrived. She remained unconscious requiring BVM all the way to the hospital. She was intubated in the ED. She has no history of blood clots or MI. She has known murmurs and 2-vessel CAD since LHC in 05/2022.  When she presented, she was in acute hypoxic respiratory failure, altered.  She had to be intubated in the ED she was also found to have elevated lactate, febrile.  Septic shock from UTI was suspected, started on antibiotics.  She also needed pressor support.  Echo done on this admission showed EF of 25%, heart failure team consulted.  She was extubated on 8/16 and transferred to [facility] service.  She finished antibiotic course for presumed UTI.  Cultures have been negative.  She has been medically optimized for systolic congestive heart failure and CHF team has cleared for discharge.  She will follow-up with cardiology as an outpatient.  Medically stable for discharge home today.  Following problems were addressed during the hospitalization:  Presumed septic shock in the setting of UTI -initially admitted to the ICU, now off pressors and transferred to the hospitalist service 8/18. She finished 5 days course  of cefepime. Cultures have all remain negative   Acute hypoxic and hypercarbic respiratory failure in the setting of acute pulmonary  edema due to acute systolic CHF -2D echo done on 4/78 shows an EF of 25%, global hypokinesis, grade 1 diastolic dysfunction.  CHF team consulted and were following -Diuresed well with furosemide and currently she appears euvolemic.   -Cardiac MRI negative for infiltrative disease, but does suggest possible prior MI in the LCx territory -Cardiac cath with multivessel disease without acute occlusions, medical management recommended -TEE showing moderate AAS and mild MR, medical management recommended -Started on Entresto, spironolactone.  Cardiology planning to start on beta-blocker as an outpatient.  Holding SGLT2i due to concern of UTI.   AKI-due to Lasix, also symptomatic with mild intravascular depletion.  Creatinine has now stabilized and she is tolerating spironolactone well.    CAD -most recent cardiac cath May 2024 with known borderline LAD lesion with a long 65-70% mid LAD lesion and mid left circumflex.  Troponin elevated.  Medical management is the current   Elevated LFTs -in the setting of #1, now normalized   Hypokalemia/hypomagnesemia-supplemented and corrected.  Magnesium supplemented today   AKI on CKD 3A -creatinine returned to baseline   Normocytic anemia-monitor hemoglobin as an outpatient.  Found to have iron deficiency, given IV iron.  Continue oral iron as an outpatient.  Morbid obesity: BMI of 40.3  Discharge Diagnoses:  Principal Problem:   Acute respiratory failure with hypoxia and hypercapnia (HCC) Active Problems:   Syncope and collapse   Respiratory arrest [facility])    Discharge Instructions  Discharge Instructions     AMB referral to Phase II Cardiac Rehabilitation   Complete by: As directed    Diagnosis:  NSTEMI   After initial evaluation and assessments completed: Virtual Based Care may be provided alone or in conjunction with Phase 2 Cardiac Rehab based on patient barriers.: Yes   Intensive Cardiac Rehabilitation (ICR) [facility] location only OR Traditional  Cardiac Rehabilitation (TCR) *If criteria for ICR are not met will enroll in TCR [facility] only): Yes   Diet - low sodium heart healthy   Complete by: As directed    Discharge instructions   Complete by: As directed    1)Please take your medications as instructed 2)Follow up with cardiology as an outpatient.  You will be called for appointment 3)Follow  up with Home health.Follow up with your PCP as an outpatient   Increase activity slowly   Complete by: As directed       Allergies as of 09/05/2022       Reactions   Celebrex [celecoxib] Itching, Swelling   Swelling throughout body Skin redness    Keflex [cephalexin] Itching   09/2018 note: tolerated Augmentin   Relafen [nabumetone] Itching, Swelling   Cipro [ciprofloxacin Hcl] Hives, Itching, Rash   Flagyl [metronidazole] Itching, Swelling, Rash        Medication List     STOP taking these medications    amLODipine 10 MG tablet Commonly known as: NORVASC   carvedilol 12.5 MG tablet Commonly known as: COREG   furosemide 20 MG tablet Commonly known as: LASIX   irbesartan 150 MG tablet Commonly known as: AVAPRO   potassium chloride SA 20 MEQ tablet Commonly known as: KLOR-CON M       TAKE these medications    acetaminophen 650 MG CR tablet Commonly known as: TYLENOL Take 650 mg by mouth daily as needed for pain.   alendronate 35 MG tablet Commonly known as: FOSAMAX Take 35 mg by mouth every 7 (seven) days.   atorvastatin 80 MG tablet Commonly known as: LIPITOR Take 1 tablet (80 mg total) by mouth daily.   brimonidine 0.15 % ophthalmic solution Commonly known as: ALPHAGAN Place 1 drop into both eyes 2 (two) times daily.   cholestyramine 4 g packet Commonly known as: Questran Take 1 packet (4 g total) by mouth daily. Take at least 2 hours before or after rest of the medications What changed:  when to take this additional instructions   clopidogrel 75 MG tablet Commonly known as: PLAVIX Take 1 tablet  (75 mg total) by mouth daily.   Dermacloud Oint Apply 1 application  topically 2 (two) times daily as needed (itching).   diclofenac Sodium 1 % Gel Commonly known as: VOLTAREN Apply 2 g topically at bedtime as needed (knee pain).   fenofibrate 145 MG tablet Commonly known as: TRICOR Take 145 mg by mouth daily.   ferrous sulfate 325 (65 FE) MG EC tablet Take 325 mg by mouth daily in the afternoon.   FISH OIL PO Take 1 capsule by mouth daily in the afternoon.   gabapentin 300 MG capsule Commonly known as: NEURONTIN Take 300 mg by mouth 3 (three) times daily.   MAGNESIUM PO Take 1 tablet by mouth daily in the afternoon.   metFORMIN 1000 MG tablet Commonly known as: GLUCOPHAGE Take 1,000 mg by mouth 2 (two) times daily with a meal.   OVER THE COUNTER MEDICATION Apply 1 patch topically daily as needed (back pain). Coralite pain patch   pantoprazole 40 MG tablet Commonly known as: PROTONIX Take 40 mg by mouth daily.   sacubitril-valsartan 24-26 MG Commonly known as: ENTRESTO Take 1 tablet  by mouth 2 (two) times daily.   sertraline 50 MG tablet Commonly known as: ZOLOFT Take 50 mg by mouth daily.   spironolactone 25 MG tablet Commonly known as: ALDACTONE Take 1 tablet (25 mg total) by mouth daily. Start taking on: September 06, 2022   tiZANidine 2 MG tablet Commonly known as: ZANAFLEX Take 2 mg by mouth every 6 (six) hours as needed for muscle spasms.   TURMERIC PO Take 1 tablet by mouth daily in the afternoon.   VITAMIN B-12 PO Take 1 tablet by mouth daily in the afternoon.               Durable Medical Equipment  (From admission, onward)           Start     Ordered   09/04/22 1349  For home use only DME Bedside commode  Once       Comments: 3n1  Question Answer Comment  Patient needs a bedside commode to treat with the following condition Heart failure [facility])   Patient needs a bedside commode to treat with the following condition Chronic back  pain   Patient needs a bedside commode to treat with the following condition History of CVA (cerebrovascular accident)      09/04/22 1357            Follow-up Information     [facility] Follow up.   Specialty: Cardiology Why: 09/11/22 at 2:30 PM  Hospital Follow-up at the [facility] at [facility], Entrance C Contact information: [address] [place] [address] [phone]        [facility] Follow up.   Specialty: Home Health Services Why: Home Health RN and Physical Therapy-agency will call to make arrangements Contact information: [address] [place] [address] [phone]         Abner Greenspan, MD Follow up.   Specialty: Family Medicine Why: September 10, 2022 at 11 am for hospital follow up appt Contact information: [place] [address] [phone]                Allergies  Allergen Reactions   Celebrex [Celecoxib] Itching and Swelling    Swelling throughout body Skin redness    Keflex [Cephalexin] Itching    09/2018 note: tolerated Augmentin   Relafen [Nabumetone] Itching and Swelling   Cipro [Ciprofloxacin Hcl] Hives, Itching and Rash   Flagyl [Metronidazole] Itching, Swelling and Rash    Consultations: Cardiology   Procedures/Studies: EP STUDY  Result Date: 09/03/2022 See surgical note for result.  MR CARDIAC VELOCITY FLOW MAP  Result Date: 09/03/2022 CLINICAL DATA:  Cardiomyopathy of uncertain etiology EXAM: CARDIAC MRI TECHNIQUE: The patient was scanned on a 1.5 Tesla GE magnet. A dedicated cardiac coil was used. Functional imaging was done using Fiesta sequences. 2,3, and 4 chamber views were done to assess for RWMA's. Modified Simpson's rule using a short axis stack was used to calculate an ejection fraction on a dedicated work [employer]. The patient received 8 cc of Gadavist. After 10 minutes  inversion recovery sequences were used to assess for infiltration and scar tissue. FINDINGS: Limited images of the lung fields showed no gross abnormalities. Normal left ventricular size with mild LV hypertrophy. Septal-lateral dyssynchrony consistent with LBBB, diffuse hypokinesis worse in the inferolateral wall with LV EF 36%. Normal right ventricular size with decreased systolic function, EF 37%. Normal left and right atrial sizes.  Trileaflet aortic valve with thickening and moderate restriction, no significant regurgitation. By regurgitant fraction calculation, there was only trivial MR with regurgitant fraction 4%, but visually mitral regurgitation looked at least mild. On delayed enhancement imaging, there was >50% wall thickness primarily subendocardial late gadolinium enhancement (LGE) in the basal to mid inferolateral wall. MEASURMENTS: MEASURMENTS LVEDV 133 mL LVEDVi 66 mL/m2 LVSV 48 mL LVEF 36% RVEDV 79 mL RVEDVi 39 mL/m2 RVSV 29 mL RVEF 37% Aortic forward volume 46 mL Aortic regurgitant fraction 4% T1 1133, ECV 25% IMPRESSION: 1. Normal LV size with mild LV hypertrophy. Diffuse hypokinesis appearing worse in the inferolateral wall, septal-lateral dyssynchrony consistent with LBBB, LV EF calculated to be 36%. 2.  Normal RV size with EF 37%. 3. Thickened and trileaflet aortic valve. Visually moderate aortic stenosis. Would confirm with echo doppler. 4. Visually at least mild mitral regurgitation but regurgitant fraction calculation was only 4%. 5. Delayed enhancement showed subendocardial >50% wall thickness LGE in the basal to mid inferolateral wall. This is a coronary-type pattern and suggests possible prior MI involving LCx territory. 6.  Normal extracellular volume percentage. Dalton Mclean Electronically Signed   By: Marca Ancona M.D.   On: 09/03/2022 13:37   MR CARDIAC VELOCITY FLOW MAP  Result Date: 09/03/2022 CLINICAL DATA:  Cardiomyopathy of uncertain etiology EXAM: CARDIAC MRI TECHNIQUE:  The patient was scanned on a 1.5 Tesla GE magnet. A dedicated cardiac coil was used. Functional imaging was done using Fiesta sequences. 2,3, and 4 chamber views were done to assess for RWMA's. Modified Simpson's rule using a short axis stack was used to calculate an ejection fraction on a dedicated work [employer], [employer]. The patient received 8 cc of Gadavist. After 10 minutes inversion recovery sequences were used to assess for infiltration and scar tissue. FINDINGS: Limited images of the lung fields showed no gross abnormalities. Normal left ventricular size with mild LV hypertrophy. Septal-lateral dyssynchrony consistent with LBBB, diffuse hypokinesis worse in the inferolateral wall with LV EF 36%. Normal right ventricular size with decreased systolic function, EF 37%. Normal left and right atrial sizes. Trileaflet aortic valve with thickening and moderate restriction, no significant regurgitation. By regurgitant fraction calculation, there was only trivial MR with regurgitant fraction 4%, but visually mitral regurgitation looked at least mild. On delayed enhancement imaging, there was >50% wall thickness primarily subendocardial late gadolinium enhancement (LGE) in the basal to mid inferolateral wall. MEASURMENTS: MEASURMENTS LVEDV 133 mL LVEDVi 66 mL/m2 LVSV 48 mL LVEF 36% RVEDV 79 mL RVEDVi 39 mL/m2 RVSV 29 mL RVEF 37% Aortic forward volume 46 mL Aortic regurgitant fraction 4% T1 1133, ECV 25% IMPRESSION: 1. Normal LV size with mild LV hypertrophy. Diffuse hypokinesis appearing worse in the inferolateral wall, septal-lateral dyssynchrony consistent with LBBB, LV EF calculated to be 36%. 2.  Normal RV size with EF 37%. 3. Thickened and trileaflet aortic valve. Visually moderate aortic stenosis. Would confirm with echo doppler. 4. Visually at least mild mitral regurgitation but regurgitant fraction calculation was only 4%. 5. Delayed enhancement showed subendocardial >50% wall thickness LGE in the  basal to mid inferolateral wall. This is a coronary-type pattern and suggests possible prior MI involving LCx territory. 6.  Normal extracellular volume percentage. Dalton Mclean Electronically Signed   By: Marca Ancona M.D.   On: 09/03/2022 13:37   MR CARDIAC MORPHOLOGY W WO CONTRAST  Result Date: 09/03/2022 CLINICAL DATA:  Cardiomyopathy of uncertain etiology EXAM: CARDIAC MRI TECHNIQUE: The patient was scanned on a 1.5 Tesla GE  magnet. A dedicated cardiac coil was used. Functional imaging was done using Fiesta sequences. 2,3, and 4 chamber views were done to assess for RWMA's. Modified Simpson's rule using a short axis stack was used to calculate an ejection fraction on a dedicated work [employer]. The patient received 8 cc of Gadavist. After 10 minutes inversion recovery sequences were used to assess for infiltration and scar tissue. FINDINGS: Limited images of the lung fields showed no gross abnormalities. Normal left ventricular size with mild LV hypertrophy. Septal-lateral dyssynchrony consistent with LBBB, diffuse hypokinesis worse in the inferolateral wall with LV EF 36%. Normal right ventricular size with decreased systolic function, EF 37%. Normal left and right atrial sizes. Trileaflet aortic valve with thickening and moderate restriction, no significant regurgitation. By regurgitant fraction calculation, there was only trivial MR with regurgitant fraction 4%, but visually mitral regurgitation looked at least mild. On delayed enhancement imaging, there was >50% wall thickness primarily subendocardial late gadolinium enhancement (LGE) in the basal to mid inferolateral wall. MEASURMENTS: MEASURMENTS LVEDV 133 mL LVEDVi 66 mL/m2 LVSV 48 mL LVEF 36% RVEDV 79 mL RVEDVi 39 mL/m2 RVSV 29 mL RVEF 37% Aortic forward volume 46 mL Aortic regurgitant fraction 4% T1 1133, ECV 25% IMPRESSION: 1. Normal LV size with mild LV hypertrophy. Diffuse hypokinesis appearing worse in the inferolateral  wall, septal-lateral dyssynchrony consistent with LBBB, LV EF calculated to be 36%. 2.  Normal RV size with EF 37%. 3. Thickened and trileaflet aortic valve. Visually moderate aortic stenosis. Would confirm with echo doppler. 4. Visually at least mild mitral regurgitation but regurgitant fraction calculation was only 4%. 5. Delayed enhancement showed subendocardial >50% wall thickness LGE in the basal to mid inferolateral wall. This is a coronary-type pattern and suggests possible prior MI involving LCx territory. 6.  Normal extracellular volume percentage. Dalton Mclean Electronically Signed   By: Marca Ancona M.D.   On: 09/03/2022 13:37   ECHOCARDIOGRAM COMPLETE  Result Date: 08/31/2022    ECHOCARDIOGRAM REPORT   Patient Name:   RASHAWN BRASIER Mendell Date of Exam: 08/30/2022 Medical Rec #:  [id number]     Height:       60.0 in Accession #:    [id number]    Weight:       213.0 lb Date of Birth:  07-Apr-1945     BSA:          1.917 m Patient Age:    77 years      BP:           117/58 mmHg Patient Gender: F             HR:           83 bpm. Exam Location:  Inpatient Procedure: 2D Echo, Color Doppler and Cardiac Doppler STAT ECHO Indications:     Shock  History:         Patient has prior history of Echocardiogram examinations, most                  recent 05/17/2022. Risk Factors:Hypertension, Diabetes and                  Dyslipidemia.  Sonographer:     Irving Burton Senior RDCS Referring Phys:  [phone] TRACI R TURNER Diagnosing Phys: Arvilla Meres MD  Sonographer Comments: Scanned supine on artificial respirator. IMPRESSIONS  1. Left ventricular ejection fraction, by estimation, is 25%. The left ventricle has severely decreased function. The left ventricle demonstrates global hypokinesis. Left ventricular diastolic  parameters are consistent with Grade I diastolic dysfunction  (impaired relaxation).  2. Right ventricular systolic function is normal. The right ventricular size is normal. There is moderately elevated pulmonary artery  systolic pressure.  3. Left atrial size was mildly dilated.  4. The mitral valve is degenerative. Moderate mitral valve regurgitation. No evidence of mitral stenosis. Moderate mitral annular calcification.  5. The aortic valve is tricuspid. There is moderate calcification of the aortic valve. Aortic valve regurgitation is not visualized. Moderate aortic valve stenosis. Aortic valve area, by VTI measures 1.11 cm. Aortic valve mean gradient measures 11.0 mmHg. Aortic valve Vmax measures 2.16 m/s.  6. The inferior vena cava is dilated in size with <50% respiratory variability, suggesting right atrial pressure of 15 mmHg. Conclusion(s)/Recommendation(s): Report ammended from original interpretation. Aortic valve is moderately calcified with restricted leaflet mobility. Mean gradient only 11 but measures AVA 1.1cm2 with dimensionless index of 0.34. Suspect there is at least moderate (if not, severe) low-flow, low-gradient AS. FINDINGS  Left Ventricle: Left ventricular ejection fraction, by estimation, is 25%. The left ventricle has severely decreased function. The left ventricle demonstrates global hypokinesis. The left ventricular internal cavity size was normal in size. There is no left ventricular hypertrophy. Abnormal (paradoxical) septal motion, consistent with left bundle branch block. Left ventricular diastolic parameters are consistent with Grade I diastolic dysfunction (impaired relaxation). Right Ventricle: The right ventricular size is normal. No increase in right ventricular wall thickness. Right ventricular systolic function is normal. There is moderately elevated pulmonary artery systolic pressure. The tricuspid regurgitant velocity is 2.75 m/s, and with an assumed right atrial pressure of 15 mmHg, the estimated right ventricular systolic pressure is 45.2 mmHg. Left Atrium: Left atrial size was mildly dilated. Right Atrium: Right atrial size was normal in size. Pericardium: There is no evidence of  pericardial effusion. Mitral Valve: The mitral valve is degenerative in appearance. Moderate mitral annular calcification. Moderate mitral valve regurgitation. No evidence of mitral valve stenosis. Tricuspid Valve: The tricuspid valve is normal in structure. Tricuspid valve regurgitation is mild . No evidence of tricuspid stenosis. Aortic Valve: The aortic valve is tricuspid. There is moderate calcification of the aortic valve. Aortic valve regurgitation is not visualized. Moderate aortic stenosis is present. Aortic valve mean gradient measures 11.0 mmHg. Aortic valve peak gradient  measures 18.7 mmHg. Aortic valve area, by VTI measures 1.11 cm. Pulmonic Valve: The pulmonic valve was normal in structure. Pulmonic valve regurgitation is not visualized. No evidence of pulmonic stenosis. Aorta: The aortic root is normal in size and structure. Venous: The inferior vena cava is dilated in size with less than 50% respiratory variability, suggesting right atrial pressure of 15 mmHg. IAS/Shunts: No atrial level shunt detected by color flow Doppler.  LEFT VENTRICLE PLAX 2D LVIDd:         4.60 cm     Diastology LVIDs:         4.00 cm     LV e' medial:    3.48 cm/s LV PW:         0.80 cm     LV E/e' medial:  24.7 LV IVS:        1.00 cm     LV e' lateral:   6.20 cm/s LVOT diam:     2.00 cm     LV E/e' lateral: 13.9 LV SV:         45 LV SV Index:   24 LVOT Area:     3.14 cm  LV Volumes (MOD)  LV vol d, MOD A2C: 97.2 ml LV vol d, MOD A4C: 80.4 ml LV vol s, MOD A2C: 70.5 ml LV vol s, MOD A4C: 52.9 ml LV SV MOD A2C:     26.7 ml LV SV MOD A4C:     80.4 ml LV SV MOD BP:      26.0 ml RIGHT VENTRICLE RV S prime:     7.94 cm/s TAPSE (M-mode): 1.6 cm LEFT ATRIUM             Index        RIGHT ATRIUM           Index LA diam:        3.70 cm 1.93 cm/m   RA Area:     10.70 cm LA Vol (A2C):   60.5 ml 31.56 ml/m  RA Volume:   21.40 ml  11.16 ml/m LA Vol (A4C):   56.1 ml 29.26 ml/m LA Biplane Vol: 58.1 ml 30.31 ml/m  AORTIC VALVE AV Area  (Vmax):    1.08 cm AV Area (Vmean):   1.13 cm AV Area (VTI):     1.11 cm AV Vmax:           216.00 cm/s AV Vmean:          150.000 cm/s AV VTI:            0.406 m AV Peak Grad:      18.7 mmHg AV Mean Grad:      11.0 mmHg LVOT Vmax:         74.20 cm/s LVOT Vmean:        54.100 cm/s LVOT VTI:          0.144 m LVOT/AV VTI ratio: 0.35  AORTA Ao Root diam: 2.80 cm MITRAL VALVE                  TRICUSPID VALVE MV Area (PHT): 4.31 cm       TR Peak grad:   30.2 mmHg MV Decel Time: 176 msec       TR Vmax:        275.00 cm/s MR Peak grad:    82.1 mmHg MR Mean grad:    38.0 mmHg    SHUNTS MR Vmax:         453.00 cm/s  Systemic VTI:  0.14 m MR Vmean:        281.0 cm/s   Systemic Diam: 2.00 cm MR PISA:         1.01 cm MR PISA Eff ROA: 7 mm MR PISA Radius:  0.40 cm MV E velocity: 85.90 cm/s MV A velocity: 125.00 cm/s MV E/A ratio:  0.69 Arvilla Meres MD Electronically signed by Arvilla Meres MD Signature Date/Time: 08/30/2022/2:27:06 PM    Final (Updated)    EEG adult  Result Date: 08/30/2022 Charlsie Quest, MD     08/30/2022  5:10 PM Patient Name: LESHANDA CROUSE MRN: [id number] Epilepsy Attending: Charlsie Quest Referring Physician/Provider: Steffanie Dunn, DO Date: 07/30/2022 Duration: 23.56 mins Patient history: 77yo F with ams getting eeg to evaluate for seizure. Level of alertness: comatose AEDs during EEG study: Propofol Technical aspects: This EEG study was done with scalp electrodes positioned according to the 10-20 International system of electrode placement. Electrical activity was reviewed with band pass filter of 1-70Hz , sensitivity of 7 uV/mm, display speed of 62mm/sec with a 60Hz  notched filter applied as appropriate. EEG data were recorded continuously and digitally stored.  Video monitoring was  available and reviewed as appropriate. Description:  EEG showed continuous generalized 3 to 6 Hz theta-delta slowing admixed with 15 to 18 Hz beta activity distributed symmetrically and diffusely.  Sharp  transients were seen in left centro-temporal region.Hyperventilation and photic stimulation were not performed.   ABNORMALITY - Continuous slow, generalized IMPRESSION: This study is suggestive of severe diffuse encephalopathy, nonspecific etiology. No seizures or definite epileptiform discharges were seen throughout the recording. Charlsie Quest   CARDIAC CATHETERIZATION  Addendum Date: 08/30/2022     Mid LAD lesion is 65% stenosed.   Dist Cx lesion is 65% stenosed.   Ost Cx to Prox Cx lesion is 30% stenosed.   Prox RCA-2 lesion is 20% stenosed.   Prox RCA-1 lesion is 30% stenosed.   2nd Diag lesion is 40% stenosed.   RPAV lesion is 80% stenosed. 1.  Unchanged moderate multivessel disease with no acute occlusions. 2.  Fick cardiac output of 7.8 L/min and Fick cardiac index of 4.1 L/min/m with thermodilution cardiac output of 6.3 L/min and thermodilution cardiac index of 3.3 L/min/m with the following hemodynamics:  Right atrial pressure mean of 12 mmHg  Right ventricular pressure 47/12 with an end-diastolic pressure of 19 mmHg  Wedge pressure mean 24 mmHg  PA pressure 54/28 with a mean of 40 mmHg  Pulmonary vascular resistance of 2 Woods units 3.  LVEDP of 30 mmHg Recommendation: Continue diuresis and goal-directed medical therapy for acute systolic heart failure.  Results reviewed with spouse.   Result Date: 08/30/2022   Mid LAD lesion is 65% stenosed.   Dist Cx lesion is 65% stenosed.   Ost Cx to Prox Cx lesion is 30% stenosed.   Prox RCA-2 lesion is 20% stenosed.   Prox RCA-1 lesion is 30% stenosed.   2nd Diag lesion is 40% stenosed.   RPAV lesion is 80% stenosed. 1.  Unchanged moderate multivessel disease with no acute occlusions. 2.  Fick cardiac output of 7.8 L/min and Fick cardiac index of 4.  1 L/min/m with thermodilution cardiac output of 6.3 L/min and thermodilution cardiac index of 3.3 L/min/m with the following hemodynamics:  Right atrial pressure mean of 12 mmHg  Right ventricular pressure  47/12 with an end-diastolic pressure of 19 mmHg  Wedge pressure mean 24 mmHg  PA pressure 54/28 with a mean of 40 mmHg  Pulmonary vascular resistance of 2 Woods units 3.  LVEDP of 30 mmHg Recommendation: Continue diuresis and goal-directed medical therapy for acute systolic heart failure.   DG Chest Portable 1 View  Result Date: 08/30/2022 CLINICAL DATA:  Central line placement EXAM: PORTABLE CHEST 1 VIEW COMPARISON:  Earlier today FINDINGS: Endotracheal tube with tip between the clavicular heads and carina. An enteric tube at least reaches the stomach. Right IJ line with tip at the SVC. Normal heart size and mediastinal contours. Kerley lines and hazy airspace opacity, reference preceding CT. No pneumothorax. IMPRESSION: 1. No complicating features of central line and enteric tube placement. 2. Stable aeration. Electronically Signed   By: Tiburcio Pea M.D.   On: 08/30/2022 05:56   CT Angio Chest/Abd/Pel for Dissection W and/or Wo Contrast  Result Date: 08/30/2022 CLINICAL DATA:  Severe back pain before becoming unresponsive. EXAM: CT ANGIOGRAPHY CHEST, ABDOMEN AND PELVIS TECHNIQUE: Non-contrast CT of the chest was initially obtained. Multidetector CT imaging through the chest, abdomen and pelvis was performed using the standard protocol during bolus administration of intravenous contrast. Multiplanar reconstructed images and MIPs were obtained and reviewed to evaluate the vascular anatomy.  RADIATION DOSE REDUCTION: This exam was performed according to the departmental dose-optimization program which includes automated exposure control, adjustment of the mA and/or kV according to patient size and/or use of iterative reconstruction technique. CONTRAST:  75mL OMNIPAQUE IOHEXOL 350 MG/ML SOLN COMPARISON:  Abdomen and pelvis CT 08/22/2022 FINDINGS: CTA CHEST FINDINGS Cardiovascular: Pre contrast imaging shows no hyperdense crescent in the wall of the thoracic aorta to suggest the presence of an acute  intramural hematoma. Heart size upper normal. No substantial pericardial effusion. Coronary artery calcification is evident. Mild atherosclerotic calcification is noted in the wall of the thoracic aorta. Mediastinum/Nodes: 2 cm right thyroid nodule. This has been evaluated on previous imaging. (ref: J Am Coll Radiol. 2015 Feb;12(2): 143-50). 11 mm short axis precarinal lymph node is upper normal for size. Upper normal hilar lymph nodes evident bilaterally. The esophagus has normal imaging features. There is no axillary lymphadenopathy. Lungs/Pleura: Endotracheal tube evident. Interlobular septal thickening noted in both lungs with mosaic ground-glass opacity in the lungs diffusely in dense consolidative airspace disease in the dependent lower lungs bilaterally. Small bilateral pleural effusions evident. Musculoskeletal: No worrisome lytic or sclerotic osseous abnormality. Review of the MIP images confirms the above findings. CTA ABDOMEN AND PELVIS FINDINGS VASCULAR Aorta: Normal caliber aorta without aneurysm, dissection, vasculitis or significant stenosis. Celiac: Patent without evidence of aneurysm, dissection, vasculitis or significant stenosis. SMA: Patent without evidence of aneurysm, dissection, vasculitis or significant stenosis. Renals: Both renal arteries are patent without evidence of aneurysm, dissection, vasculitis, fibromuscular dysplasia or significant stenosis. IMA: Patent without evidence of aneurysm, dissection, vasculitis or significant stenosis. Inflow: Patent without evidence of aneurysm, dissection, vasculitis or significant stenosis. Veins: No obvious venous abnormality within the limitations of this arterial phase study. Review of the MIP images confirms the above findings. NON-VASCULAR Hepatobiliary: The liver shows diffusely decreased attenuation suggesting fat deposition. Gallbladder is surgically absent. No intrahepatic or extrahepatic biliary dilation. Pancreas: No focal mass lesion. No  dilatation of the main duct. No intraparenchymal cyst. No peripancreatic edema. Spleen: No splenomegaly. No suspicious focal mass lesion. Adrenals/Urinary Tract: No adrenal nodule or mass. Right kidney unremarkable. 1.3 cm low-density lesion lower pole left kidney was characterized as a benign simple cyst on MRI 11/21/2020. No followup imaging is recommended. No evidence for hydroureter. Foley catheter decompresses the urinary bladder. Stomach/Bowel: NG tube tip is in the mid stomach. Duodenum is normally positioned as is the ligament of Treitz. No small bowel wall thickening. No small bowel dilatation. The terminal ileum is normal. Nonvisualization of the appendix is consistent with the reported history of appendectomy. No gross colonic mass. No colonic wall thickening. Diverticular changes are noted in the left colon without evidence of diverticulitis. Lymphatic: There is no gastrohepatic or hepatoduodenal ligament lymphadenopathy. No retroperitoneal or mesenteric lymphadenopathy. No pelvic sidewall lymphadenopathy. Reproductive: Hysterectomy.  There is no adnexal mass. Other: No intraperitoneal free fluid. Musculoskeletal: Pelvic floor laxity evident. No worrisome lytic or sclerotic osseous abnormality. Review of the MIP images confirms the above findings. IMPRESSION: 1. No evidence for thoracic or abdominal aortic aneurysm or dissection. 2. Interlobular septal thickening with mosaic ground-glass opacity in the lungs diffusely. Imaging features suggest pulmonary edema. 3. Dense consolidative airspace disease in the dependent lower lungs bilaterally with small bilateral pleural effusions. Imaging features compatible with multifocal pneumonia. 4. Hepatic steatosis. 5. Left colonic diverticulosis without diverticulitis. 6. Pelvic floor laxity. 7.  Aortic Atherosclerosis (ICD10-I70.0). Electronically Signed   By: Kennith Center M.D.   On: 08/30/2022 05:17   CT Head  Wo Contrast  Result Date: 08/30/2022 CLINICAL  DATA:  Blunt poly trauma with unresponsiveness. EXAM: CT HEAD WITHOUT CONTRAST TECHNIQUE: Contiguous axial images were obtained from the base of the skull through the vertex without intravenous contrast. RADIATION DOSE REDUCTION: This exam was performed according to the departmental dose-optimization program which includes automated exposure control, adjustment of the mA and/or kV according to patient size and/or use of iterative reconstruction technique. COMPARISON:  06/05/2021 FINDINGS: Brain: No evidence of acute infarction, hemorrhage, hydrocephalus, extra-axial collection or mass lesion/mass effect. Patchy low-density in the cerebral white matter attributed to chronic small vessel ischemia, moderately extensive. Vascular: No hyperdense vessel or unexpected calcification. Skull: Normal. Negative for fracture or focal lesion. Sinuses/Orbits: No acute finding.  Bilateral cataract resection. IMPRESSION: 1. No acute finding. 2. 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 Electronically Signed   By: Tiburcio Pea M.D.   On: 08/30/2022 04:56   DG Chest Portable 1 View  Result Date: 08/30/2022 CLINICAL DATA:  Check endotracheal tube placement EXAM: PORTABLE CHEST 1 VIEW COMPARISON:  05/15/2022 FINDINGS: Endotracheal tube is noted in satisfactory position. Cardiac shadow is stable. Aortic calcifications are again seen. Mild vascular congestion is noted. Diffuse increased density is noted throughout the right lung which may represent asymmetric edema or focal infiltrate. IMPRESSION: Endotracheal tube in satisfactory position. Vascular congestion is noted. Increased density is noted in the right lung as described. Electronically Signed   By: Alcide Clever M.D.   On: 08/30/2022 03:46      Subjective: Patient seen and examined at bedside today.  Hemodynamically stable.  Sitting in the chair, eating her lunch.  Very comfortable.  No chest pain or shortness of breath.  Feels eager to go home.   Discharge plan discussed with husband at bedside.  Discharge Exam: Vitals:   09/05/22 0718 09/05/22 1122  BP: (!) 140/48 129/65  Pulse: 80 84  Resp: 17 20  Temp: 98.7 F (37.1 C) 98 F (36.7 C)  SpO2: 96% 96%   Vitals:   09/05/22 0324 09/05/22 0600 09/05/22 0718 09/05/22 1122  BP: (!) 143/60  (!) 140/48 129/65  Pulse: 83  80 84  Resp: 19  17 20   Temp: 98.8 F (37.1 C)  98.7 F (37.1 C) 98 F (36.7 C)  TempSrc: Oral  Oral Oral  SpO2: 93%  96% 96%  Weight:  93.8 kg    Height:        General: Pt is alert, awake, not in acute distress,obese Cardiovascular: RRR, S1/S2 +, no rubs, no gallops Respiratory: CTA bilaterally, no wheezing, no rhonchi Abdominal: Soft, NT, ND, bowel sounds + Extremities: no edema, no cyanosis    The results of significant diagnostics from this hospitalization (including imaging, microbiology, ancillary and laboratory) are listed below for reference.     Microbiology: Recent Results (from the past 240 hour(s))  Blood culture (routine x 2)     Status: None   Collection Time: 08/30/22  3:18 AM   Specimen: BLOOD RIGHT HAND  Result Value Ref Range Status   Specimen Description BLOOD RIGHT HAND  Final   Special Requests   Final    BOTTLES DRAWN AEROBIC AND ANAEROBIC Blood Culture adequate volume   Culture   Final    NO GROWTH 5 DAYS Performed at [facility], [address] [place], [place] [address]    Report Status 09/04/2022 FINAL  Final  Blood culture (routine x 2)     Status: None   Collection Time:  08/30/22  3:22 AM   Specimen: BLOOD  Result Value Ref Range Status   Specimen Description BLOOD RIGHT ANTECUBITAL  Final   Special Requests   Final    BOTTLES DRAWN AEROBIC AND ANAEROBIC Blood Culture adequate volume   Culture   Final    NO GROWTH 5 DAYS Performed at [facility], [address], [place], [place] [address]    Report Status 09/04/2022 FINAL  Final  Resp panel by RT-PCR (RSV, Flu A&B, Covid) Anterior Nasal Swab      Status: None   Collection Time: 08/30/22  5:00 AM   Specimen: Anterior Nasal Swab  Result Value Ref Range Status   SARS Coronavirus 2 by RT PCR NEGATIVE NEGATIVE Final   Influenza A by PCR NEGATIVE NEGATIVE Final   Influenza B by PCR NEGATIVE NEGATIVE Final    Comment: (NOTE) The Xpert Xpress SARS-CoV-2/FLU/RSV plus assay is intended as an aid in the diagnosis of influenza from Nasopharyngeal swab specimens and should not be used as a sole basis for treatment. Nasal washings and aspirates are unacceptable for Xpert Xpress SARS-CoV-2/FLU/RSV testing.  Fact Sheet for Patients: BloggerCourse.com  Fact Sheet for Healthcare Providers: SeriousBroker.it  This test is not yet approved or cleared by the [place] FDA and has been authorized for detection and/or diagnosis of SARS-CoV-2 by FDA under an Emergency Use Authorization (EUA). This EUA will remain in effect (meaning this test can be used) for the duration of the COVID-19 declaration under Section 564(b)(1) of the Act, 21 U.S.C. section 360bbb-3(b)(1), unless the authorization is terminated or revoked.     Resp Syncytial Virus by PCR NEGATIVE NEGATIVE Final    Comment: (NOTE) Fact Sheet for Patients: BloggerCourse.com  Fact Sheet for Healthcare Providers: SeriousBroker.it  This test is not yet approved or cleared by the [place] FDA and has been authorized for detection and/or diagnosis of SARS-CoV-2 by FDA under an Emergency Use Authorization (EUA). This EUA will remain in effect (meaning this test can be used) for the duration of the COVID-19 declaration under Section 564(b)(1) of the Act, 21 U.S.C. section 360bbb-3(b)(1), unless the authorization is terminated or revoked.  Performed at [facility], [address] [place], [place] [address]   MRSA Next Gen by PCR, Nasal     Status: None   Collection  Time: 08/30/22  6:30 AM   Specimen: Nasal Mucosa; Nasal Swab  Result Value Ref Range Status   MRSA by PCR Next Gen NOT DETECTED NOT DETECTED Final    Comment: (NOTE) The GeneXpert MRSA Assay (FDA approved for NASAL specimens only), is one component of a comprehensive MRSA colonization surveillance program. It is not intended to diagnose MRSA infection nor to guide or monitor treatment for MRSA infections. Test performance is not FDA approved in patients less than 54 years old. Performed at [facility], [address] [place], [place] [address]      Labs: BNP (last 3 results) Recent Labs    08/30/22 0322  BNP 458.3*   Basic Metabolic Panel: Recent Labs  Lab 09/01/22 0511 09/01/22 1143 09/01/22 1655 09/02/22 0450 09/03/22 0428 09/04/22 0535 09/05/22 0655  NA 137 137 137 139 136 137 137  K 3.8 3.8 3.8 3.5 3.6 3.8 3.5  CL 106 102 99 98 99 105 103  CO2 25 25 23 26 25 23 24   GLUCOSE 112* 111* 160* 125* 142* 123* 143*  BUN 21 22 20  24* 37* 32* 18  CREATININE 1.11*  0.94 0.90 0.94 1.29* 0.88 0.85  CALCIUM 8.3* 9.1 9.1 9.3 8.4* 8.3* 9.2  MG 2.2 1.9  --   --  1.6* 1.9 1.6*   Liver Function Tests: Recent Labs  Lab 08/30/22 0322 08/31/22 1111 08/31/22 2025 09/03/22 0428 09/05/22 0655  AST 42* 35 37 40 29  ALT 28 24 23  34 30  ALKPHOS 75 57 60 63 64  BILITOT 0.7 0.8 1.2 0.5 0.7  PROT 6.3* 5.5* 5.3* 5.5* 6.1*  ALBUMIN 3.3* 2.7* 2.6* 2.6* 2.9*   No results for input(s): "LIPASE", "AMYLASE" in the last 168 hours. No results for input(s): "AMMONIA" in the last 168 hours. CBC: Recent Labs  Lab 08/30/22 0322 08/30/22 0450 08/31/22 0412 09/01/22 0511 09/03/22 0428 09/04/22 0535 09/05/22 0655  WBC 16.2*  --  17.0* 14.1* 11.3* 11.0* 10.5  NEUTROABS 11.7*  --   --  10.8*  --   --   --   HGB 10.9*  12.6   < > 8.5* 8.1* 9.4* 9.0* 9.4*  HCT 36.5  37.0   < > 26.8* 25.3* 29.8* 28.3* 30.2*  MCV 89.7  --  86.2 85.2 85.1 85.2 85.8  PLT 517*  --  365 277 343 323 357    < > = values in this interval not displayed.   Cardiac Enzymes: No results for input(s): "CKTOTAL", "CKMB", "CKMBINDEX", "TROPONINI" in the last 168 hours. BNP: Invalid input(s): "POCBNP" CBG: Recent Labs  Lab 09/04/22 1119 09/04/22 1621 09/04/22 2122 09/05/22 0628 09/05/22 1121  GLUCAP 194* 134* 151* 146* 154*   D-Dimer No results for input(s): "DDIMER" in the last 72 hours. Hgb A1c No results for input(s): "HGBA1C" in the last 72 hours. Lipid Profile Recent Labs    09/03/22 0428  TRIG 189*   Thyroid function studies No results for input(s): "TSH", "T4TOTAL", "T3FREE", "THYROIDAB" in the last 72 hours.  Invalid input(s): "FREET3" Anemia work up No results for input(s): "VITAMINB12", "FOLATE", "FERRITIN", "TIBC", "IRON", "RETICCTPCT" in the last 72 hours. Urinalysis    Component Value Date/Time   COLORURINE AMBER (A) 08/30/2022 0407   APPEARANCEUR CLOUDY (A) 08/30/2022 0407   LABSPEC 1.019 08/30/2022 0407   PHURINE 5.0 08/30/2022 0407   GLUCOSEU 50 (A) 08/30/2022 0407   HGBUR NEGATIVE 08/30/2022 0407   BILIRUBINUR NEGATIVE 08/30/2022 0407   KETONESUR NEGATIVE 08/30/2022 0407   PROTEINUR >=300 (A) 08/30/2022 0407   NITRITE NEGATIVE 08/30/2022 0407   LEUKOCYTESUR TRACE (A) 08/30/2022 0407   Sepsis Labs Recent Labs  Lab 09/01/22 0511 09/03/22 0428 09/04/22 0535 09/05/22 0655  WBC 14.1* 11.3* 11.0* 10.5   Microbiology Recent Results (from the past 240 hour(s))  Blood culture (routine x 2)     Status: None   Collection Time: 08/30/22  3:18 AM   Specimen: BLOOD RIGHT HAND  Result Value Ref Range Status   Specimen Description BLOOD RIGHT HAND  Final   Special Requests   Final    BOTTLES DRAWN AEROBIC AND ANAEROBIC Blood Culture adequate volume   Culture   Final    NO GROWTH 5 DAYS Performed at [facility], [address], [place], [place] [address]    Report Status 09/04/2022 FINAL  Final  Blood culture (routine x 2)     Status: None    Collection Time: 08/30/22  3:22 AM   Specimen: BLOOD  Result Value Ref Range Status   Specimen Description BLOOD RIGHT ANTECUBITAL  Final   Special Requests   Final    BOTTLES DRAWN  AEROBIC AND ANAEROBIC Blood Culture adequate volume   Culture   Final    NO GROWTH 5 DAYS Performed at [facility], [address], [place], [place] [address]    Report Status 09/04/2022 FINAL  Final  Resp panel by RT-PCR (RSV, Flu A&B, Covid) Anterior Nasal Swab     Status: None   Collection Time: 08/30/22  5:00 AM   Specimen: Anterior Nasal Swab  Result Value Ref Range Status   SARS Coronavirus 2 by RT PCR NEGATIVE NEGATIVE Final   Influenza A by PCR NEGATIVE NEGATIVE Final   Influenza B by PCR NEGATIVE NEGATIVE Final    Comment: (NOTE) The Xpert Xpress SARS-CoV-2/FLU/RSV plus assay is intended as an aid in the diagnosis of influenza from Nasopharyngeal swab specimens and should not be used as a sole basis for treatment. Nasal washings and aspirates are unacceptable for Xpert Xpress SARS-CoV-2/FLU/RSV testing.  Fact Sheet for Patients: BloggerCourse.com  Fact Sheet for Healthcare Providers: SeriousBroker.it  This test is not yet approved or cleared by the [place] FDA and has been authorized for detection and/or diagnosis of SARS-CoV-2 by FDA under an Emergency Use Authorization (EUA). This EUA will remain in effect (meaning this test can be used) for the duration of the COVID-19 declaration under Section 564(b)(1) of the Act, 21 U.S.C. section 360bbb-3(b)(1), unless the authorization is terminated or revoked.     Resp Syncytial Virus by PCR NEGATIVE NEGATIVE Final    Comment: (NOTE) Fact Sheet for Patients: BloggerCourse.com  Fact Sheet for Healthcare Providers: SeriousBroker.it  This test is not yet approved or cleared by the [place] FDA and has been authorized for  detection and/or diagnosis of SARS-CoV-2 by FDA under an Emergency Use Authorization (EUA). This EUA will remain in effect (meaning this test can be used) for the duration of the COVID-19 declaration under Section 564(b)(1) of the Act, 21 U.S.C. section 360bbb-3(b)(1), unless the authorization is terminated or revoked.  Performed at [facility], [address], [place], [place] [address]   MRSA Next Gen by PCR, Nasal     Status: None   Collection Time: 08/30/22  6:30 AM   Specimen: Nasal Mucosa; Nasal Swab  Result Value Ref Range Status   MRSA by PCR Next Gen NOT DETECTED NOT DETECTED Final    Comment: (NOTE) The GeneXpert MRSA Assay (FDA approved for NASAL specimens only), is one component of a comprehensive MRSA colonization surveillance program. It is not intended to diagnose MRSA infection nor to guide or monitor treatment for MRSA infections. Test performance is not FDA approved in patients less than 89 years old. Performed at [facility], [address] [place], [place] [address]     Please note: You were cared for by a hospitalist during your hospital stay. Once you are discharged, your primary care physician will handle any further medical issues. Please note that NO REFILLS for any discharge medications will be authorized once you are discharged, as it is imperative that you return to your primary care physician (or establish a relationship with a primary care physician if you do not have one) for your post hospital discharge needs so that they can reassess your need for medications and monitor your lab values.    Time coordinating discharge: 40 minutes  SIGNED:   Burnadette Pop, MD  [facility] 09/05/2022, 11:49 AM Pager [phone]  If 7PM-7AM, please contact night-coverage [email or web address] Password [email or web address]

## 2022-09-05 NOTE — Consult Note (Signed)
   Operating Room Services [facility] Inpatient Consult   09/05/2022  SAMIR LEYMAN 03-Sep-1945 [id number]  [facility] [[facility]]  Accountable Care Organization [ACO] Patient: Pecola Lawless PPO insurance  Primary Care Provider:  Abner Greenspan, MD, patient states her provider is Charlott Rakes with [facility] Medicine is listed to provide the transition of care follow up calls and appointments  Patient screened for hospitalizations with noted rising risk score for unplanned readmission risk 6 day length of stay which included an ICU level of care and to assess for potential [facility]  [[facility]] Care Management service needs for post hospital transition for care coordination.  Review of patient's electronic medical record reveals patient was admitted in Respiratory arrest with STEMI.  Met with patient at the bedside, up in recliner.  Patient was generous and explained reason for rounding visit.  Patient states no known needs.  Encouraged to verbalize any follow up needs with provider office, encourage to review any insurance benefit needs.  Patient and spouse returned to room expressed appreciation for [employer] for "saving her life."   Plan:  Continue to follow progress and disposition to assess for post hospital community care coordination/management needs.  Referral request for community care coordination: no current care coordination needs expressed will anticipate the community [facility] team for follow up  Of note, [facility] Care Management/Population Health does not replace or interfere with any arrangements made by the Inpatient Transition of Care team.  For questions contact:   Charlesetta Shanks, RN BSN CCM [facility]  [phone] business mobile phone Toll free office [phone]  Fax number: [phone] [email or web address]

## 2022-09-07 ENCOUNTER — Other Ambulatory Visit (HOSPITAL_COMMUNITY): Payer: Self-pay

## 2022-09-09 DIAGNOSIS — I213 ST elevation (STEMI) myocardial infarction of unspecified site: Secondary | ICD-10-CM | POA: Diagnosis not present

## 2022-09-09 DIAGNOSIS — J9601 Acute respiratory failure with hypoxia: Secondary | ICD-10-CM | POA: Diagnosis not present

## 2022-09-09 DIAGNOSIS — E1122 Type 2 diabetes mellitus with diabetic chronic kidney disease: Secondary | ICD-10-CM | POA: Diagnosis not present

## 2022-09-09 DIAGNOSIS — I5021 Acute systolic (congestive) heart failure: Secondary | ICD-10-CM | POA: Diagnosis not present

## 2022-09-09 DIAGNOSIS — J9602 Acute respiratory failure with hypercapnia: Secondary | ICD-10-CM | POA: Diagnosis not present

## 2022-09-09 DIAGNOSIS — N1831 Chronic kidney disease, stage 3a: Secondary | ICD-10-CM | POA: Diagnosis not present

## 2022-09-09 DIAGNOSIS — I130 Hypertensive heart and chronic kidney disease with heart failure and stage 1 through stage 4 chronic kidney disease, or unspecified chronic kidney disease: Secondary | ICD-10-CM | POA: Diagnosis not present

## 2022-09-09 DIAGNOSIS — D631 Anemia in chronic kidney disease: Secondary | ICD-10-CM | POA: Diagnosis not present

## 2022-09-09 DIAGNOSIS — I959 Hypotension, unspecified: Secondary | ICD-10-CM | POA: Diagnosis not present

## 2022-09-10 DIAGNOSIS — Z7689 Persons encountering health services in other specified circumstances: Secondary | ICD-10-CM | POA: Diagnosis not present

## 2022-09-10 DIAGNOSIS — I2510 Atherosclerotic heart disease of native coronary artery without angina pectoris: Secondary | ICD-10-CM | POA: Diagnosis not present

## 2022-09-10 DIAGNOSIS — I110 Hypertensive heart disease with heart failure: Secondary | ICD-10-CM | POA: Diagnosis not present

## 2022-09-10 DIAGNOSIS — Z6839 Body mass index (BMI) 39.0-39.9, adult: Secondary | ICD-10-CM | POA: Diagnosis not present

## 2022-09-10 DIAGNOSIS — N1831 Chronic kidney disease, stage 3a: Secondary | ICD-10-CM | POA: Diagnosis not present

## 2022-09-11 ENCOUNTER — Ambulatory Visit (HOSPITAL_COMMUNITY)
Admit: 2022-09-11 | Discharge: 2022-09-11 | Disposition: A | Discharge status: Home / Self Care | Payer: Medicare PPO | Attending: Physician Assistant | Admitting: Physician Assistant

## 2022-09-11 ENCOUNTER — Encounter (HOSPITAL_COMMUNITY): Payer: Self-pay

## 2022-09-11 VITALS — BP 128/69 | HR 82 | Wt 208.0 lb

## 2022-09-11 DIAGNOSIS — Z79899 Other long term (current) drug therapy: Secondary | ICD-10-CM | POA: Insufficient documentation

## 2022-09-11 DIAGNOSIS — Z7902 Long term (current) use of antithrombotics/antiplatelets: Secondary | ICD-10-CM | POA: Insufficient documentation

## 2022-09-11 DIAGNOSIS — I5022 Chronic systolic (congestive) heart failure: Secondary | ICD-10-CM | POA: Insufficient documentation

## 2022-09-11 DIAGNOSIS — I340 Nonrheumatic mitral (valve) insufficiency: Secondary | ICD-10-CM | POA: Diagnosis not present

## 2022-09-11 DIAGNOSIS — Z87440 Personal history of urinary (tract) infections: Secondary | ICD-10-CM | POA: Diagnosis not present

## 2022-09-11 DIAGNOSIS — M797 Fibromyalgia: Secondary | ICD-10-CM | POA: Insufficient documentation

## 2022-09-11 DIAGNOSIS — M4800 Spinal stenosis, site unspecified: Secondary | ICD-10-CM | POA: Insufficient documentation

## 2022-09-11 DIAGNOSIS — I350 Nonrheumatic aortic (valve) stenosis: Secondary | ICD-10-CM

## 2022-09-11 DIAGNOSIS — I110 Hypertensive heart disease with heart failure: Secondary | ICD-10-CM | POA: Diagnosis not present

## 2022-09-11 DIAGNOSIS — I447 Left bundle-branch block, unspecified: Secondary | ICD-10-CM | POA: Insufficient documentation

## 2022-09-11 DIAGNOSIS — I428 Other cardiomyopathies: Secondary | ICD-10-CM | POA: Insufficient documentation

## 2022-09-11 DIAGNOSIS — I509 Heart failure, unspecified: Secondary | ICD-10-CM | POA: Insufficient documentation

## 2022-09-11 DIAGNOSIS — Z8673 Personal history of transient ischemic attack (TIA), and cerebral infarction without residual deficits: Secondary | ICD-10-CM | POA: Diagnosis not present

## 2022-09-11 DIAGNOSIS — I252 Old myocardial infarction: Secondary | ICD-10-CM | POA: Insufficient documentation

## 2022-09-11 DIAGNOSIS — I080 Rheumatic disorders of both mitral and aortic valves: Secondary | ICD-10-CM | POA: Diagnosis not present

## 2022-09-11 DIAGNOSIS — I2510 Atherosclerotic heart disease of native coronary artery without angina pectoris: Secondary | ICD-10-CM | POA: Diagnosis not present

## 2022-09-11 DIAGNOSIS — D509 Iron deficiency anemia, unspecified: Secondary | ICD-10-CM | POA: Diagnosis not present

## 2022-09-11 DIAGNOSIS — E1141 Type 2 diabetes mellitus with diabetic mononeuropathy: Secondary | ICD-10-CM | POA: Insufficient documentation

## 2022-09-11 DIAGNOSIS — M549 Dorsalgia, unspecified: Secondary | ICD-10-CM | POA: Insufficient documentation

## 2022-09-11 MED ORDER — CARVEDILOL 3.125 MG PO TABS: 3.1250 mg | ORAL_TABLET | Freq: Two times a day (BID) | ORAL | 11 refills | Status: DC

## 2022-09-11 NOTE — Progress Notes (Addendum)
Advanced Heart Failure Clinic Note   PCP: Dr. Yetta Flock Primary Cardiologist: Dr. Bing Matter HF Cardiologist: Dr. Gala Romney  HPI: 77 y/o female w/ h/o medically managed CAD, LBBB, aortic stenosis, T2DM, HTN, HLD, h/o CVA on aspirin and GIBs (per report sounds like AVMs s/p cauterization). Also w/ fibromyalgia and severe back pain from spinal stenosis limiting mobility.    Had recent admission 5/1-05/17/22 for chest pain. LHC showed moderate 2-vessel disease with mid LAD 65% and distal Cx 65%. At that point, opted for medical management given PCI of the LAD would require long stent and jailing large D2. 2D Echo showed normal EF, mild MS, moderate AS (PV = 3, AVA = 1.1, MG = 20, PG = 34, DI = 0.39).    Presented to ED by EMS 8/15 w/ acute hypoxic respiratory failure and AMS. Became unresponsive and ultimately intubated. Was in shock and required pressor support. Shock thought to be primarily septic w/ presumed UTI as source.   Course c/b acute on chronic CHF w/ newly reduced EF. Echo with drop in LVEF down to 25% (normal in May), global HK, normal RV, mod MR + mod calcified aortic valve w/ estimated gradient 11.0 mmHg. Subsequently underwent R/LHC which demonstrated unchanged moderate multivessel disease. RHC: RA 12, PAP 54/28(40), PCWP 24, LVEDP 30, Fick cardiac output of 7.8 L/min and Fick cardiac index of 4.1 L/min/m with thermodilution cardiac output of 6.3 L/min and thermodilution cardiac index of 3.3 L/min/m.    Advanced Heart Failure consulted to assist with management of CHF. She diuresed with IV lasix and GDMT titrated but limited by orthostatic symptoms.  cMRI demonstrated LVEF 36%, RVEF 37%, visually moderate AS, LGE pattern suggesting possible prior MI in LCX territory. TEE confirmed just mild to moderate AS and mild MR with LVEF 35-40%  She is here today for hospital follow-up. She is accompanied by her husband who assists with the history. Denies shortness of breath, lower extremity  edema, orthopnea, PND. Home weight has been stable between 202-203 lb. She has been ambulating with a walker. Mobility limited by back pain d/t a "pinched nerve". Reports compliance with all medications.   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   ROS: All systems negative except as listed in HPI, PMH and Problem List.  SH:  Social History   Socioeconomic History   Marital status: Married    Spouse name: Not on file   Number of children: Not on file   Years of education: Not on file   Highest education level: Not on file  Occupational History   Not on file  Tobacco Use   Smoking status: Never   Smokeless tobacco: Never  Vaping Use   Vaping status: Never Used  Substance and Sexual Activity   Alcohol use: Not Currently   Drug use: Never   Sexual activity: Not on file  Other Topics Concern   Not on file  Social History Narrative   Not on file   Social Determinants of Health   Financial Resource Strain: Not on file  Food Insecurity: No Food Insecurity (05/15/2022)   Hunger Vital Sign    Worried About Running Out of Food in the Last Year: Never true    Ran Out of Food in the Last Year: Never true  Transportation Needs: No Transportation Needs (05/15/2022)   PRAPARE - Administrator, Civil Service (Medical): No    Lack of Transportation (Non-Medical): No  Physical Activity: Not on file  Stress: Not on file  Social Connections: Not on file  Intimate Partner Violence: Not At Risk (05/15/2022)   Humiliation, Afraid, Rape, and Kick questionnaire    Fear of Current or Ex-Partner: No    Emotionally Abused: No    Physically Abused: No    Sexually Abused: No    FH:  Family History  Problem Relation Age of Onset   Cancer Mother    Hypertension Mother    Stroke Mother    Diabetes Mother    Heart failure Mother    Hypertension Father    Heart disease Father    Colon cancer Neg Hx    Esophageal cancer Neg  Hx    Stomach cancer Neg Hx    Rectal cancer Neg Hx    Colon polyps Neg Hx     Past Medical History:  Diagnosis Date   AKI (acute kidney injury) (HCC) 03/27/2021   Angina pectoris (HCC) 05/15/2022   Aortic stenosis 09/04/2019   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 sciatica 02/12/2022   Class 3 obesity (HCC) 03/26/2021   CVA (cerebral vascular accident) (HCC) 2015?   Cystocele with uterine prolapse 01/17/2018   Diabetes (HCC)    Dizziness 03/27/2021   Dyslipidemia 10/10/2018   Essential hypertension 10/10/2018   Fibromyalgia    GERD (gastroesophageal reflux disease)    Heart murmur 10/10/2018   History of CVA (cerebrovascular accident) 10/10/2018   Hydronephrosis of right kidney 03/26/2021   Hypercholesterolemia    Hypertension    Hypokalemia 03/27/2021   Leukocytosis    Mitral regurgitation 11/24/2018   Nonsustained ventricular tachycardia (HCC) 11/24/2018   Obesity    Post-operative nausea and vomiting    "hard to wake up"   Rectocele 01/17/2018   Sacroiliitis (HCC) 02/12/2022   Sepsis secondary to UTI (HCC) 03/26/2021   Type 2 diabetes mellitus with hyperglycemia (HCC) 03/26/2021   Type 2 diabetes mellitus without complication, without long-term current use of insulin (HCC) 10/10/2018    Current Outpatient Medications  Medication Sig Dispense Refill   acetaminophen (TYLENOL) 650 MG CR tablet Take 650 mg by mouth daily as needed for pain.     alendronate (FOSAMAX) 35 MG tablet Take 35 mg by mouth every 7 (seven) days.     atorvastatin (LIPITOR) 80 MG tablet Take 1 tablet (80 mg total) by mouth daily. 30 tablet 3   brimonidine (ALPHAGAN) 0.15 % ophthalmic solution Place 1 drop into both eyes 2 (two) times daily.     carvedilol (COREG) 3.125 MG tablet Take 1 tablet (3.125 mg total) by mouth 2 (two) times daily. 60 tablet 11   cholestyramine (QUESTRAN) 4 g packet Take 1 packet (4 g total) by mouth daily. Take at least 2 hours before or after rest of the medications  (Patient taking differently: Take 4 g by mouth every 3 (three) days.) 30 each 11   clopidogrel (PLAVIX) 75 MG tablet Take 1 tablet (75 mg total) by mouth daily. 30 tablet 3   Cyanocobalamin (VITAMIN B-12 PO) Take 1 tablet by mouth daily in the afternoon.     diclofenac Sodium (VOLTAREN) 1 % GEL Apply 2 g topically at bedtime as needed (knee pain).     fenofibrate (TRICOR) 145 MG tablet Take 145 mg by mouth daily.     ferrous sulfate 325 (65 FE) MG EC tablet Take 325 mg by mouth daily in the afternoon.     gabapentin (NEURONTIN) 300 MG capsule Take 300 mg by mouth  3 (three) times daily.     [employer] [facility]) OINT Apply 1 application  topically 2 (two) times daily as needed (itching).     MAGNESIUM PO Take 1 tablet by mouth daily in the afternoon.     metFORMIN (GLUCOPHAGE) 1000 MG tablet Take 1,000 mg by mouth 2 (two) times daily with a meal.     Omega-3 Fatty Acids (FISH OIL PO) Take 1 capsule by mouth daily in the afternoon.     OVER THE COUNTER MEDICATION Apply 1 patch topically daily as needed (back pain). [place] pain patch     pantoprazole (PROTONIX) 40 MG tablet Take 40 mg by mouth daily.     sacubitril-valsartan (ENTRESTO) 24-26 MG Take 1 tablet by mouth 2 (two) times daily. 120 tablet 0   sertraline (ZOLOFT) 50 MG tablet Take 50 mg by mouth daily.     spironolactone (ALDACTONE) 25 MG tablet Take 1 tablet (25 mg total) by mouth daily. 60 tablet 0   tiZANidine (ZANAFLEX) 2 MG tablet Take 2 mg by mouth every 6 (six) hours as needed for muscle spasms.     TURMERIC PO Take 1 tablet by mouth daily in the afternoon.     No current facility-administered medications for this encounter.    Vitals:   09/11/22 1429  BP: 128/69  Pulse: 82  SpO2: 98%  Weight: 94.3 kg (208 lb)    PHYSICAL EXAM:  General:  Well appearing. Arrived in wheelchair. HEENT: normal Neck: supple. JVP difficult d/t thick neck Cor: PMI normal. Regular rate & rhythm. No rubs, gallops, 3/6 SEM RUSB  with preserved S2 Lungs: clear Abdomen: soft, nontender, nondistended.  Extremities: no cyanosis, clubbing, rash, edema Neuro: alert & orientedx3. Affect pleasant.   ECG: SR with PVC, LBBB   ASSESSMENT & PLAN:   1. Chronic systolic CHF:  -Prior echo with normal EF in 5/24.  -Echo 08/24 with EF 25%, RV okay, moderate MR, moderate low flow/low gradient AS with AVA 1.1 cm^2, IVC dilated.   -LHC 08/24:diffuse nonobstructive multivessel CAD (stable from cath in 05/24) -cMRI 08/24: LGE pattern in basal to mid inferolateral wall suggesting possible prior MI in LCX territory, LVEF 36%, RVEF 37%, low likelihood of infiltrative disease -TEE 09/03/22: LVEF 35-40%, mild to moderate AS and mild MR -Has chronic LBBB -? Mixed ischemic/nonischemic cardiomyopathy -NYHA III, confounded by impaired mobility from spinal stenosis. Volume looks good on exam. Does not require any loop diuretics. - Continue Entresto 24/26 bid. - Continue Spironolactone 25 mg daily.  - No Farxiga with hx of multiple admissions with UTIs  - Start coreg 3.125 mg BID - GDMT previously limited by orthostatic dizziness. BP stable today.  - Had labs with PCP in [place] yesterday, will request results - will need repeat echo in 3 months after meds optimized. If EF < 35% will need to consider ICD, not sure that QRS is wide enough for CRT-D.   2. Aortic stenosis: Looks like mild to moderate AS on TEE 09/03/22. EF 35-40%. Will need to follow.   3. Mitral regurgitation:  moderate functional MR by TTE. Appears only mild by TEE - continue HF optimization per above   4. CAD: Stable, moderate multivessel disease on cath 08/24, did not seem to explain fall in EF.  No culprit lesion to treat. cMRI with evidence of possible prior infarct in LCX territory - Continue Plavix (has been on this since her stroke) + statin.   5. Iron deficiency anemia:  - given IV iron  during recent admission - now on po iron   Follow-up: 3-4 weeks with  PharmD for medication titration, 3 months with Dr. Gala Romney with echo

## 2022-09-11 NOTE — Patient Instructions (Addendum)
EKG done today.  No Labs done today.   START Carvedilol 3.125mg  (1 tablet) by mouth 2 times daily.   No other medication changes were made. Please continue all current medications as prescribed.  Your physician recommends that you schedule a follow-up appointment in: 4 weeks with our Clinic Pharmacist and in 3 months with Dr. Gala Romney with an echo prior to your appointment.   Your physician has requested that you have an echocardiogram. Echocardiography is a painless test that uses sound waves to create images of your heart. It provides your doctor with information about the size and shape of your heart and how well your heart's chambers and valves are working. This procedure takes approximately one hour. There are no restrictions for this procedure. Please do NOT wear cologne, perfume, aftershave, or lotions (deodorant is allowed). Please arrive 15 minutes prior to your appointment time.  If you have any questions or concerns before your next appointment please send [place] a message through [place] or call our office at [phone].    TO LEAVE A MESSAGE FOR THE NURSE SELECT OPTION 2, PLEASE LEAVE A MESSAGE INCLUDING: YOUR NAME DATE OF BIRTH CALL BACK NUMBER REASON FOR CALL**this is important as we prioritize the call backs  YOU WILL RECEIVE A CALL BACK THE SAME DAY AS LONG AS YOU CALL BEFORE 4:00 PM   Do the following things EVERYDAY: Weigh yourself in the morning before breakfast. Write it down and keep it in a log. Take your medicines as prescribed Eat low salt foods--Limit salt (sodium) to 2000 mg per day.  Stay as active as you can everyday Limit all fluids for the day to less than 2 liters   At the [facility], you and your health needs are our priority. As part of our continuing mission to provide you with exceptional heart care, we have created designated Provider Care Teams. These Care Teams include your primary Cardiologist (physician) and Advanced Practice  Providers (APPs- Physician Assistants and Nurse Practitioners) who all work together to provide you with the care you need, when you need it.   You may see any of the following providers on your designated Care Team at your next follow up: Dr Arvilla Meres Dr Marca Ancona Dr. Marcos Eke, NP Robbie Lis, [place] [facility] [place], [place] Brynda Peon, NP Karle Plumber, PharmD   Please be sure to bring in all your medications bottles to every appointment.    Thank you for choosing [facility]

## 2022-09-16 ENCOUNTER — Telehealth: Payer: Self-pay | Admitting: Cardiology

## 2022-09-16 DIAGNOSIS — F411 Generalized anxiety disorder: Secondary | ICD-10-CM | POA: Diagnosis not present

## 2022-09-16 DIAGNOSIS — E782 Mixed hyperlipidemia: Secondary | ICD-10-CM | POA: Diagnosis not present

## 2022-09-16 NOTE — Telephone Encounter (Signed)
Patient called the answering service this afternoon concerned that her BP dropped when she was taking carvedilol. She was started on carvedilol 3.125 mg BID on 8/27. Yesterday AM, her BP was low, lowest reading being 60/53. She felt very fatigued. She did not take carvedilol yesterday or this morning. This AM, her BP was 103/63. She feels much better today than she did yesterday. Denies fatigue, dizziness, syncope, near syncope today.   I instructed patient to stop taking carvedilol due to low BP. I will forward this message to Anna Genre PA who saw the patient on 8/27 as an FYI  Jonita Albee, PA-C 09/16/2022 4:11 PM

## 2022-09-19 DIAGNOSIS — I110 Hypertensive heart disease with heart failure: Secondary | ICD-10-CM | POA: Diagnosis not present

## 2022-09-20 DIAGNOSIS — J9602 Acute respiratory failure with hypercapnia: Secondary | ICD-10-CM | POA: Diagnosis not present

## 2022-09-20 DIAGNOSIS — I213 ST elevation (STEMI) myocardial infarction of unspecified site: Secondary | ICD-10-CM | POA: Diagnosis not present

## 2022-09-20 DIAGNOSIS — N1831 Chronic kidney disease, stage 3a: Secondary | ICD-10-CM | POA: Diagnosis not present

## 2022-09-20 DIAGNOSIS — E1122 Type 2 diabetes mellitus with diabetic chronic kidney disease: Secondary | ICD-10-CM | POA: Diagnosis not present

## 2022-09-20 DIAGNOSIS — I130 Hypertensive heart and chronic kidney disease with heart failure and stage 1 through stage 4 chronic kidney disease, or unspecified chronic kidney disease: Secondary | ICD-10-CM | POA: Diagnosis not present

## 2022-09-20 DIAGNOSIS — D631 Anemia in chronic kidney disease: Secondary | ICD-10-CM | POA: Diagnosis not present

## 2022-09-20 DIAGNOSIS — I959 Hypotension, unspecified: Secondary | ICD-10-CM | POA: Diagnosis not present

## 2022-09-20 DIAGNOSIS — I5021 Acute systolic (congestive) heart failure: Secondary | ICD-10-CM | POA: Diagnosis not present

## 2022-09-20 DIAGNOSIS — J9601 Acute respiratory failure with hypoxia: Secondary | ICD-10-CM | POA: Diagnosis not present

## 2022-09-24 LAB — ECHO TEE
AV Mean grad: 13 mmHg
AV Peak grad: 22.1 mmHg
Ao pk vel: 2.35 m/s

## 2022-09-25 DIAGNOSIS — J9601 Acute respiratory failure with hypoxia: Secondary | ICD-10-CM | POA: Diagnosis not present

## 2022-09-25 DIAGNOSIS — D631 Anemia in chronic kidney disease: Secondary | ICD-10-CM | POA: Diagnosis not present

## 2022-09-25 DIAGNOSIS — I5021 Acute systolic (congestive) heart failure: Secondary | ICD-10-CM | POA: Diagnosis not present

## 2022-09-25 DIAGNOSIS — N1831 Chronic kidney disease, stage 3a: Secondary | ICD-10-CM | POA: Diagnosis not present

## 2022-09-25 DIAGNOSIS — I959 Hypotension, unspecified: Secondary | ICD-10-CM | POA: Diagnosis not present

## 2022-09-25 DIAGNOSIS — J9602 Acute respiratory failure with hypercapnia: Secondary | ICD-10-CM | POA: Diagnosis not present

## 2022-09-25 DIAGNOSIS — E1122 Type 2 diabetes mellitus with diabetic chronic kidney disease: Secondary | ICD-10-CM | POA: Diagnosis not present

## 2022-09-25 DIAGNOSIS — I130 Hypertensive heart and chronic kidney disease with heart failure and stage 1 through stage 4 chronic kidney disease, or unspecified chronic kidney disease: Secondary | ICD-10-CM | POA: Diagnosis not present

## 2022-09-25 DIAGNOSIS — I213 ST elevation (STEMI) myocardial infarction of unspecified site: Secondary | ICD-10-CM | POA: Diagnosis not present

## 2022-09-26 NOTE — Progress Notes (Signed)
Advanced Heart Failure Clinic Note   PCP: Dr. Yetta Flock Primary Cardiologist: Dr. Bing Matter HF Cardiologist: Dr. Gala Romney  HPI:  77 y/o female w/ h/o medically managed CAD, LBBB, aortic stenosis, T2DM, HTN, HLD, h/o CVA on aspirin and GIBs (per report sounds like AVMs s/p cauterization). Also w/ fibromyalgia and severe back pain from spinal stenosis limiting mobility.    Had recent admission 5/1-05/17/22 for chest pain. LHC showed moderate 2-vessel disease with mid LAD 65% and distal Cx 65%. At that point, opted for medical management given PCI of the LAD would require long stent and jailing large D2. 2D Echo showed normal EF, mild MS, moderate AS (PV = 3, AVA = 1.1, MG = 20, PG = 34, DI = 0.39).    Presented to ED by EMS 08/30/22 w/ acute hypoxic respiratory failure and AMS. Became unresponsive and ultimately intubated. Was in shock and required pressor support. Shock thought to be primarily septic w/ presumed UTI as source.    Course c/b acute on chronic CHF w/ newly reduced EF. Echo with drop in LVEF down to 25% (normal in May), global HK, normal RV, mod MR + mod calcified aortic valve w/ estimated gradient 11.0 mmHg. Subsequently underwent R/LHC which demonstrated unchanged moderate multivessel disease. RHC: RA 12, PAP 54/28(40), PCWP 24, LVEDP 30, Fick cardiac output of 7.8 L/min and Fick cardiac index of 4.1 L/min/m with thermodilution cardiac output of 6.3 L/min and thermodilution cardiac index of 3.3 L/min/m.     Advanced Heart Failure consulted to assist with management of CHF. She diuresed with IV Lasix and GDMT titrated but limited by orthostatic symptoms.  cMRI demonstrated LVEF 36%, RVEF 37%, visually moderate AS, LGE pattern suggesting possible prior MI in LCX territory. TEE confirmed just mild to moderate AS and mild MR with LVEF 35-40%.   Presented to AHF Clinic for hospital follow-up 09/11/22. She was accompanied by her husband who assisted with the history. Denied shortness of  breath, lower extremity edema, orthopnea, PND. Home weight had been stable between 202-203 lbs. She had been ambulating with a walker. Mobility was limited by back pain d/t a "pinched nerve". Reported compliance with all medications. 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    Today she returns to HF clinic for pharmacist medication titration with her husband. At last visit with APP, carvedilol 3.125 mg BID was initiated. Unfortunately, she developed symptomatic hypotension and BP dropped to 60/53. She was instructed to stop carvedilol.  Overall she is feeling well today. Mobility is limited by a pinched nerve in her back. She uses Voltaren gel and Frankincense essential oils to help. No dizziness or lightheadedness since carvedilol was stopped. BP is typically 114-116/60s at home, but can go up to ~130/60s. BP has not gone lower than 114 since carvedilol was stopped. She takes her BP at home daily and the monitor sends the value to her other cardiologists office. No SOB. Weight at home has been stable at 204-205 lbs. No LEE, PND or orthopnea. She has been watching what she eats and has significantly decreased her salt intake. She never adds salt to her foods anymore. She uses a "spice blend" and avoids salt substitutes.    HF Medications: Entresto 24/26 mg BID Spironolactone 25 mg daily  Has the patient been experiencing any side effects to the medications prescribed?  Had hypotension with carvedilol, this has resolved after stopping carvedilol.   Does the patient have any  problems obtaining medications due to transportation or finances?   No; [employer] Medicare.   Understanding of regimen: good Understanding of indications: good Potential of compliance: good Patient understands to avoid NSAIDs. Patient understands to avoid decongestants.    Pertinent Lab Values: 09/05/22: Serum creatinine 0.85, BUN 18, Potassium 3.5, Sodium 137 Has had labs  drawn more recently at PCP office. Will try and obtain via fax.   Vital Signs: Weight: 207.4 lbs (last clinic weight: 208 lbs) Blood pressure: 118/58  Heart rate: 73   Assessment/Plan: 1. Chronic systolic CHF:  -Prior echo with normal EF in 5/24.  -Echo 08/24 with EF 25%, RV okay, moderate MR, moderate low flow/low gradient AS with AVA 1.1 cm^2, IVC dilated.   -LHC 08/24:diffuse nonobstructive multivessel CAD (stable from cath in 05/24) -cMRI 08/24: LGE pattern in basal to mid inferolateral wall suggesting possible prior MI in LCX territory, LVEF 36%, RVEF 37%, low likelihood of infiltrative disease -TEE 09/03/22: LVEF 35-40%, mild to moderate AS and mild MR -Has chronic LBBB -? Mixed ischemic/nonischemic cardiomyopathy -NYHA III, confounded by impaired mobility from spinal stenosis. Volume looks good on exam. Does not require any loop diuretics. - Did not tolerate carvedilol 3.125 mg BID with significant hypotension. She tolerated metoprolol succinate up to 100 mg daily in the past. She is willing to retry metoprolol today. Will start metoprolol succinate 12.5 mg daily. She will continue to monitor BP daily and if BP drops to <100 mmHg she will let us know so metoprolol can be discontinued.  - Continue Entresto 24/26 mg BID. - Continue Spironolactone 25 mg daily.  - No Farxiga with hx of multiple admissions with UTIs and history of yeast infections.  - GDMT previously limited by orthostatic dizziness. BP stable today.  - Will need repeat echo after meds optimized, scheduled for 11/2022. If EF < 35% will need to consider ICD, not sure that QRS is wide enough for CRT-D.    2. Aortic stenosis: Looks like mild to moderate AS on TEE 09/03/22. EF 35-40%. Will need to follow.    3. Mitral regurgitation:  moderate functional MR by TTE. Appears only mild by TEE - continue HF optimization per above    4. CAD: Stable, moderate multivessel disease on cath 08/2022, did not seem to explain fall in  EF.  No culprit lesion to treat. cMRI with evidence of possible prior infarct in LCX territory - Continue Plavix (has been on this since her stroke) + statin.    5. Iron deficiency anemia:  - given IV iron during recent admission - now on po iron  Follow up 2 months with Dr. Gala Romney.    Karle Plumber, PharmD, BCPS, BCCP, CPP Heart Failure Clinic Pharmacist [phone]

## 2022-09-27 DIAGNOSIS — I213 ST elevation (STEMI) myocardial infarction of unspecified site: Secondary | ICD-10-CM | POA: Diagnosis not present

## 2022-09-27 DIAGNOSIS — D631 Anemia in chronic kidney disease: Secondary | ICD-10-CM | POA: Diagnosis not present

## 2022-09-27 DIAGNOSIS — I130 Hypertensive heart and chronic kidney disease with heart failure and stage 1 through stage 4 chronic kidney disease, or unspecified chronic kidney disease: Secondary | ICD-10-CM | POA: Diagnosis not present

## 2022-09-27 DIAGNOSIS — E1122 Type 2 diabetes mellitus with diabetic chronic kidney disease: Secondary | ICD-10-CM | POA: Diagnosis not present

## 2022-09-27 DIAGNOSIS — J9601 Acute respiratory failure with hypoxia: Secondary | ICD-10-CM | POA: Diagnosis not present

## 2022-09-27 DIAGNOSIS — N1831 Chronic kidney disease, stage 3a: Secondary | ICD-10-CM | POA: Diagnosis not present

## 2022-09-27 DIAGNOSIS — J9602 Acute respiratory failure with hypercapnia: Secondary | ICD-10-CM | POA: Diagnosis not present

## 2022-09-27 DIAGNOSIS — I5021 Acute systolic (congestive) heart failure: Secondary | ICD-10-CM | POA: Diagnosis not present

## 2022-09-27 DIAGNOSIS — I959 Hypotension, unspecified: Secondary | ICD-10-CM | POA: Diagnosis not present

## 2022-10-02 DIAGNOSIS — I959 Hypotension, unspecified: Secondary | ICD-10-CM | POA: Diagnosis not present

## 2022-10-02 DIAGNOSIS — J9602 Acute respiratory failure with hypercapnia: Secondary | ICD-10-CM | POA: Diagnosis not present

## 2022-10-02 DIAGNOSIS — N1831 Chronic kidney disease, stage 3a: Secondary | ICD-10-CM | POA: Diagnosis not present

## 2022-10-02 DIAGNOSIS — D631 Anemia in chronic kidney disease: Secondary | ICD-10-CM | POA: Diagnosis not present

## 2022-10-02 DIAGNOSIS — J9601 Acute respiratory failure with hypoxia: Secondary | ICD-10-CM | POA: Diagnosis not present

## 2022-10-02 DIAGNOSIS — I213 ST elevation (STEMI) myocardial infarction of unspecified site: Secondary | ICD-10-CM | POA: Diagnosis not present

## 2022-10-02 DIAGNOSIS — I5021 Acute systolic (congestive) heart failure: Secondary | ICD-10-CM | POA: Diagnosis not present

## 2022-10-02 DIAGNOSIS — I130 Hypertensive heart and chronic kidney disease with heart failure and stage 1 through stage 4 chronic kidney disease, or unspecified chronic kidney disease: Secondary | ICD-10-CM | POA: Diagnosis not present

## 2022-10-02 DIAGNOSIS — E1122 Type 2 diabetes mellitus with diabetic chronic kidney disease: Secondary | ICD-10-CM | POA: Diagnosis not present

## 2022-10-03 ENCOUNTER — Other Ambulatory Visit (HOSPITAL_COMMUNITY): Payer: Self-pay

## 2022-10-03 DIAGNOSIS — E1122 Type 2 diabetes mellitus with diabetic chronic kidney disease: Secondary | ICD-10-CM | POA: Diagnosis not present

## 2022-10-03 DIAGNOSIS — D631 Anemia in chronic kidney disease: Secondary | ICD-10-CM | POA: Diagnosis not present

## 2022-10-03 DIAGNOSIS — I130 Hypertensive heart and chronic kidney disease with heart failure and stage 1 through stage 4 chronic kidney disease, or unspecified chronic kidney disease: Secondary | ICD-10-CM | POA: Diagnosis not present

## 2022-10-03 DIAGNOSIS — I959 Hypotension, unspecified: Secondary | ICD-10-CM | POA: Diagnosis not present

## 2022-10-03 DIAGNOSIS — I5021 Acute systolic (congestive) heart failure: Secondary | ICD-10-CM | POA: Diagnosis not present

## 2022-10-03 DIAGNOSIS — I213 ST elevation (STEMI) myocardial infarction of unspecified site: Secondary | ICD-10-CM | POA: Diagnosis not present

## 2022-10-03 DIAGNOSIS — J9601 Acute respiratory failure with hypoxia: Secondary | ICD-10-CM | POA: Diagnosis not present

## 2022-10-03 DIAGNOSIS — J9602 Acute respiratory failure with hypercapnia: Secondary | ICD-10-CM | POA: Diagnosis not present

## 2022-10-03 DIAGNOSIS — N1831 Chronic kidney disease, stage 3a: Secondary | ICD-10-CM | POA: Diagnosis not present

## 2022-10-09 DIAGNOSIS — I5021 Acute systolic (congestive) heart failure: Secondary | ICD-10-CM | POA: Diagnosis not present

## 2022-10-09 DIAGNOSIS — J9601 Acute respiratory failure with hypoxia: Secondary | ICD-10-CM | POA: Diagnosis not present

## 2022-10-09 DIAGNOSIS — D631 Anemia in chronic kidney disease: Secondary | ICD-10-CM | POA: Diagnosis not present

## 2022-10-09 DIAGNOSIS — E1122 Type 2 diabetes mellitus with diabetic chronic kidney disease: Secondary | ICD-10-CM | POA: Diagnosis not present

## 2022-10-09 DIAGNOSIS — N1831 Chronic kidney disease, stage 3a: Secondary | ICD-10-CM | POA: Diagnosis not present

## 2022-10-09 DIAGNOSIS — I959 Hypotension, unspecified: Secondary | ICD-10-CM | POA: Diagnosis not present

## 2022-10-09 DIAGNOSIS — I130 Hypertensive heart and chronic kidney disease with heart failure and stage 1 through stage 4 chronic kidney disease, or unspecified chronic kidney disease: Secondary | ICD-10-CM | POA: Diagnosis not present

## 2022-10-09 DIAGNOSIS — I213 ST elevation (STEMI) myocardial infarction of unspecified site: Secondary | ICD-10-CM | POA: Diagnosis not present

## 2022-10-09 DIAGNOSIS — J9602 Acute respiratory failure with hypercapnia: Secondary | ICD-10-CM | POA: Diagnosis not present

## 2022-10-10 ENCOUNTER — Ambulatory Visit (HOSPITAL_COMMUNITY)
Admission: RE | Admit: 2022-10-10 | Discharge: 2022-10-10 | Disposition: A | Discharge status: Home / Self Care | Payer: Medicare PPO | Source: Ambulatory Visit | Attending: Cardiology | Admitting: Cardiology

## 2022-10-10 VITALS — BP 118/58 | HR 73 | Wt 207.4 lb

## 2022-10-10 DIAGNOSIS — I110 Hypertensive heart disease with heart failure: Secondary | ICD-10-CM | POA: Diagnosis not present

## 2022-10-10 DIAGNOSIS — I5022 Chronic systolic (congestive) heart failure: Secondary | ICD-10-CM | POA: Insufficient documentation

## 2022-10-10 DIAGNOSIS — I350 Nonrheumatic aortic (valve) stenosis: Secondary | ICD-10-CM | POA: Diagnosis not present

## 2022-10-10 DIAGNOSIS — J9601 Acute respiratory failure with hypoxia: Secondary | ICD-10-CM | POA: Diagnosis not present

## 2022-10-10 DIAGNOSIS — M4800 Spinal stenosis, site unspecified: Secondary | ICD-10-CM | POA: Insufficient documentation

## 2022-10-10 DIAGNOSIS — I959 Hypotension, unspecified: Secondary | ICD-10-CM | POA: Diagnosis not present

## 2022-10-10 DIAGNOSIS — E119 Type 2 diabetes mellitus without complications: Secondary | ICD-10-CM | POA: Diagnosis not present

## 2022-10-10 DIAGNOSIS — E1122 Type 2 diabetes mellitus with diabetic chronic kidney disease: Secondary | ICD-10-CM | POA: Diagnosis not present

## 2022-10-10 DIAGNOSIS — Z7902 Long term (current) use of antithrombotics/antiplatelets: Secondary | ICD-10-CM | POA: Insufficient documentation

## 2022-10-10 DIAGNOSIS — N1831 Chronic kidney disease, stage 3a: Secondary | ICD-10-CM | POA: Diagnosis not present

## 2022-10-10 DIAGNOSIS — I447 Left bundle-branch block, unspecified: Secondary | ICD-10-CM | POA: Insufficient documentation

## 2022-10-10 DIAGNOSIS — Z8673 Personal history of transient ischemic attack (TIA), and cerebral infarction without residual deficits: Secondary | ICD-10-CM | POA: Insufficient documentation

## 2022-10-10 DIAGNOSIS — I340 Nonrheumatic mitral (valve) insufficiency: Secondary | ICD-10-CM | POA: Diagnosis not present

## 2022-10-10 DIAGNOSIS — Z79899 Other long term (current) drug therapy: Secondary | ICD-10-CM | POA: Insufficient documentation

## 2022-10-10 DIAGNOSIS — Z7982 Long term (current) use of aspirin: Secondary | ICD-10-CM | POA: Insufficient documentation

## 2022-10-10 DIAGNOSIS — I2510 Atherosclerotic heart disease of native coronary artery without angina pectoris: Secondary | ICD-10-CM | POA: Diagnosis not present

## 2022-10-10 DIAGNOSIS — Z87440 Personal history of urinary (tract) infections: Secondary | ICD-10-CM | POA: Insufficient documentation

## 2022-10-10 DIAGNOSIS — D509 Iron deficiency anemia, unspecified: Secondary | ICD-10-CM | POA: Diagnosis not present

## 2022-10-10 DIAGNOSIS — M797 Fibromyalgia: Secondary | ICD-10-CM | POA: Insufficient documentation

## 2022-10-10 DIAGNOSIS — J9602 Acute respiratory failure with hypercapnia: Secondary | ICD-10-CM | POA: Diagnosis not present

## 2022-10-10 DIAGNOSIS — I130 Hypertensive heart and chronic kidney disease with heart failure and stage 1 through stage 4 chronic kidney disease, or unspecified chronic kidney disease: Secondary | ICD-10-CM | POA: Diagnosis not present

## 2022-10-10 DIAGNOSIS — I5021 Acute systolic (congestive) heart failure: Secondary | ICD-10-CM | POA: Diagnosis not present

## 2022-10-10 DIAGNOSIS — I213 ST elevation (STEMI) myocardial infarction of unspecified site: Secondary | ICD-10-CM | POA: Diagnosis not present

## 2022-10-10 DIAGNOSIS — E785 Hyperlipidemia, unspecified: Secondary | ICD-10-CM | POA: Insufficient documentation

## 2022-10-10 DIAGNOSIS — D631 Anemia in chronic kidney disease: Secondary | ICD-10-CM | POA: Diagnosis not present

## 2022-10-10 MED ORDER — METOPROLOL SUCCINATE ER 25 MG PO TB24: 12.5000 mg | ORAL_TABLET | Freq: Every day | ORAL | 5 refills | Status: DC

## 2022-10-10 NOTE — Patient Instructions (Signed)
It was a pleasure seeing you today!  MEDICATIONS: -We are changing your medications today -Start metoprolol succinate 12.5 mg (1/2 tablet) at bedtime. Continue to monitor your BP and stop metoprolol if top number of your blood pressure drops to <100 mmHg.  -Call if you have questions about your medications.   NEXT APPOINTMENT: Return to clinic in 2 months with Dr. Gala Romney.  In general, to take care of your heart failure: -Limit your fluid intake to 2 Liters (half-gallon) per day.   -Limit your salt intake to ideally 2-3 grams (2000-3000 mg) per day. -Weigh yourself daily and record, and bring that "weight diary" to your next appointment.  (Weight gain of 2-3 pounds in 1 day typically means fluid weight.) -The medications for your heart are to help your heart and help you live longer.   -Please contact us before stopping any of your heart medications.  Call the clinic at [phone] with questions or to reschedule future appointments.

## 2022-10-15 DIAGNOSIS — E1159 Type 2 diabetes mellitus with other circulatory complications: Secondary | ICD-10-CM | POA: Diagnosis not present

## 2022-10-16 DIAGNOSIS — I959 Hypotension, unspecified: Secondary | ICD-10-CM | POA: Diagnosis not present

## 2022-10-16 DIAGNOSIS — F411 Generalized anxiety disorder: Secondary | ICD-10-CM | POA: Diagnosis not present

## 2022-10-16 DIAGNOSIS — E782 Mixed hyperlipidemia: Secondary | ICD-10-CM | POA: Diagnosis not present

## 2022-10-16 DIAGNOSIS — J9601 Acute respiratory failure with hypoxia: Secondary | ICD-10-CM | POA: Diagnosis not present

## 2022-10-16 DIAGNOSIS — I5021 Acute systolic (congestive) heart failure: Secondary | ICD-10-CM | POA: Diagnosis not present

## 2022-10-16 DIAGNOSIS — E1122 Type 2 diabetes mellitus with diabetic chronic kidney disease: Secondary | ICD-10-CM | POA: Diagnosis not present

## 2022-10-16 DIAGNOSIS — I130 Hypertensive heart and chronic kidney disease with heart failure and stage 1 through stage 4 chronic kidney disease, or unspecified chronic kidney disease: Secondary | ICD-10-CM | POA: Diagnosis not present

## 2022-10-16 DIAGNOSIS — I213 ST elevation (STEMI) myocardial infarction of unspecified site: Secondary | ICD-10-CM | POA: Diagnosis not present

## 2022-10-16 DIAGNOSIS — J9602 Acute respiratory failure with hypercapnia: Secondary | ICD-10-CM | POA: Diagnosis not present

## 2022-10-16 DIAGNOSIS — N1831 Chronic kidney disease, stage 3a: Secondary | ICD-10-CM | POA: Diagnosis not present

## 2022-10-16 DIAGNOSIS — D631 Anemia in chronic kidney disease: Secondary | ICD-10-CM | POA: Diagnosis not present

## 2022-10-18 DIAGNOSIS — N1831 Chronic kidney disease, stage 3a: Secondary | ICD-10-CM | POA: Diagnosis not present

## 2022-10-18 DIAGNOSIS — E1122 Type 2 diabetes mellitus with diabetic chronic kidney disease: Secondary | ICD-10-CM | POA: Diagnosis not present

## 2022-10-18 DIAGNOSIS — D631 Anemia in chronic kidney disease: Secondary | ICD-10-CM | POA: Diagnosis not present

## 2022-10-18 DIAGNOSIS — I130 Hypertensive heart and chronic kidney disease with heart failure and stage 1 through stage 4 chronic kidney disease, or unspecified chronic kidney disease: Secondary | ICD-10-CM | POA: Diagnosis not present

## 2022-10-18 DIAGNOSIS — I213 ST elevation (STEMI) myocardial infarction of unspecified site: Secondary | ICD-10-CM | POA: Diagnosis not present

## 2022-10-18 DIAGNOSIS — J9602 Acute respiratory failure with hypercapnia: Secondary | ICD-10-CM | POA: Diagnosis not present

## 2022-10-18 DIAGNOSIS — J9601 Acute respiratory failure with hypoxia: Secondary | ICD-10-CM | POA: Diagnosis not present

## 2022-10-18 DIAGNOSIS — I5021 Acute systolic (congestive) heart failure: Secondary | ICD-10-CM | POA: Diagnosis not present

## 2022-10-18 DIAGNOSIS — I959 Hypotension, unspecified: Secondary | ICD-10-CM | POA: Diagnosis not present

## 2022-10-31 DIAGNOSIS — N1831 Chronic kidney disease, stage 3a: Secondary | ICD-10-CM | POA: Diagnosis not present

## 2022-10-31 DIAGNOSIS — E1122 Type 2 diabetes mellitus with diabetic chronic kidney disease: Secondary | ICD-10-CM | POA: Diagnosis not present

## 2022-10-31 DIAGNOSIS — D631 Anemia in chronic kidney disease: Secondary | ICD-10-CM | POA: Diagnosis not present

## 2022-10-31 DIAGNOSIS — J9601 Acute respiratory failure with hypoxia: Secondary | ICD-10-CM | POA: Diagnosis not present

## 2022-10-31 DIAGNOSIS — J9602 Acute respiratory failure with hypercapnia: Secondary | ICD-10-CM | POA: Diagnosis not present

## 2022-10-31 DIAGNOSIS — I5021 Acute systolic (congestive) heart failure: Secondary | ICD-10-CM | POA: Diagnosis not present

## 2022-10-31 DIAGNOSIS — I130 Hypertensive heart and chronic kidney disease with heart failure and stage 1 through stage 4 chronic kidney disease, or unspecified chronic kidney disease: Secondary | ICD-10-CM | POA: Diagnosis not present

## 2022-10-31 DIAGNOSIS — I959 Hypotension, unspecified: Secondary | ICD-10-CM | POA: Diagnosis not present

## 2022-10-31 DIAGNOSIS — I213 ST elevation (STEMI) myocardial infarction of unspecified site: Secondary | ICD-10-CM | POA: Diagnosis not present

## 2022-11-02 ENCOUNTER — Other Ambulatory Visit (HOSPITAL_COMMUNITY): Payer: Self-pay

## 2022-11-02 DIAGNOSIS — I5022 Chronic systolic (congestive) heart failure: Secondary | ICD-10-CM

## 2022-11-02 MED ORDER — METOPROLOL SUCCINATE ER 25 MG PO TB24
12.5000 mg | ORAL_TABLET | Freq: Every day | ORAL | 3 refills | Status: DC
Start: 2022-11-02 — End: 2023-06-20

## 2022-11-05 ENCOUNTER — Other Ambulatory Visit (HOSPITAL_COMMUNITY): Payer: Self-pay

## 2022-11-05 DIAGNOSIS — I5021 Acute systolic (congestive) heart failure: Secondary | ICD-10-CM | POA: Diagnosis not present

## 2022-11-05 DIAGNOSIS — I213 ST elevation (STEMI) myocardial infarction of unspecified site: Secondary | ICD-10-CM | POA: Diagnosis not present

## 2022-11-05 DIAGNOSIS — I130 Hypertensive heart and chronic kidney disease with heart failure and stage 1 through stage 4 chronic kidney disease, or unspecified chronic kidney disease: Secondary | ICD-10-CM | POA: Diagnosis not present

## 2022-11-05 DIAGNOSIS — N1831 Chronic kidney disease, stage 3a: Secondary | ICD-10-CM | POA: Diagnosis not present

## 2022-11-05 DIAGNOSIS — J9602 Acute respiratory failure with hypercapnia: Secondary | ICD-10-CM | POA: Diagnosis not present

## 2022-11-05 DIAGNOSIS — I959 Hypotension, unspecified: Secondary | ICD-10-CM | POA: Diagnosis not present

## 2022-11-05 DIAGNOSIS — E1122 Type 2 diabetes mellitus with diabetic chronic kidney disease: Secondary | ICD-10-CM | POA: Diagnosis not present

## 2022-11-05 DIAGNOSIS — J9601 Acute respiratory failure with hypoxia: Secondary | ICD-10-CM | POA: Diagnosis not present

## 2022-11-05 DIAGNOSIS — D631 Anemia in chronic kidney disease: Secondary | ICD-10-CM | POA: Diagnosis not present

## 2022-11-14 DIAGNOSIS — M85831 Other specified disorders of bone density and structure, right forearm: Secondary | ICD-10-CM | POA: Diagnosis not present

## 2022-11-14 DIAGNOSIS — E2839 Other primary ovarian failure: Secondary | ICD-10-CM | POA: Diagnosis not present

## 2022-11-14 DIAGNOSIS — Z1231 Encounter for screening mammogram for malignant neoplasm of breast: Secondary | ICD-10-CM | POA: Diagnosis not present

## 2022-11-14 DIAGNOSIS — M8589 Other specified disorders of bone density and structure, multiple sites: Secondary | ICD-10-CM | POA: Diagnosis not present

## 2022-11-15 DIAGNOSIS — E1159 Type 2 diabetes mellitus with other circulatory complications: Secondary | ICD-10-CM | POA: Diagnosis not present

## 2022-11-16 DIAGNOSIS — F411 Generalized anxiety disorder: Secondary | ICD-10-CM | POA: Diagnosis not present

## 2022-11-16 DIAGNOSIS — E782 Mixed hyperlipidemia: Secondary | ICD-10-CM | POA: Diagnosis not present

## 2022-12-03 ENCOUNTER — Other Ambulatory Visit: Payer: Self-pay

## 2022-12-03 MED ORDER — SACUBITRIL-VALSARTAN 24-26 MG PO TABS: 1.0000 | ORAL_TABLET | Freq: Two times a day (BID) | ORAL | 1 refills | Status: DC

## 2022-12-04 ENCOUNTER — Telehealth: Payer: Self-pay | Admitting: *Deleted

## 2022-12-05 DIAGNOSIS — R1033 Periumbilical pain: Secondary | ICD-10-CM | POA: Diagnosis not present

## 2022-12-05 DIAGNOSIS — Z6839 Body mass index (BMI) 39.0-39.9, adult: Secondary | ICD-10-CM | POA: Diagnosis not present

## 2022-12-06 ENCOUNTER — Encounter (HOSPITAL_COMMUNITY): Payer: Self-pay | Admitting: Internal Medicine

## 2022-12-06 ENCOUNTER — Ambulatory Visit (HOSPITAL_BASED_OUTPATIENT_CLINIC_OR_DEPARTMENT_OTHER)
Admission: RE | Admit: 2022-12-06 | Discharge: 2022-12-06 | Disposition: A | Discharge status: Home / Self Care | Payer: Medicare PPO | Source: Ambulatory Visit | Attending: Internal Medicine | Admitting: Internal Medicine

## 2022-12-06 ENCOUNTER — Ambulatory Visit (HOSPITAL_COMMUNITY)
Admission: RE | Admit: 2022-12-06 | Discharge: 2022-12-06 | Disposition: A | Discharge status: Home / Self Care | Payer: Medicare PPO | Source: Ambulatory Visit | Attending: Physician Assistant | Admitting: Physician Assistant

## 2022-12-06 VITALS — BP 146/86 | HR 88 | Wt 209.6 lb

## 2022-12-06 DIAGNOSIS — Z955 Presence of coronary angioplasty implant and graft: Secondary | ICD-10-CM | POA: Diagnosis not present

## 2022-12-06 DIAGNOSIS — I2510 Atherosclerotic heart disease of native coronary artery without angina pectoris: Secondary | ICD-10-CM | POA: Diagnosis not present

## 2022-12-06 DIAGNOSIS — E669 Obesity, unspecified: Secondary | ICD-10-CM | POA: Insufficient documentation

## 2022-12-06 DIAGNOSIS — M797 Fibromyalgia: Secondary | ICD-10-CM | POA: Insufficient documentation

## 2022-12-06 DIAGNOSIS — Z8673 Personal history of transient ischemic attack (TIA), and cerebral infarction without residual deficits: Secondary | ICD-10-CM | POA: Insufficient documentation

## 2022-12-06 DIAGNOSIS — Z7984 Long term (current) use of oral hypoglycemic drugs: Secondary | ICD-10-CM | POA: Diagnosis not present

## 2022-12-06 DIAGNOSIS — I252 Old myocardial infarction: Secondary | ICD-10-CM | POA: Diagnosis not present

## 2022-12-06 DIAGNOSIS — I5042 Chronic combined systolic (congestive) and diastolic (congestive) heart failure: Secondary | ICD-10-CM | POA: Diagnosis not present

## 2022-12-06 DIAGNOSIS — M4800 Spinal stenosis, site unspecified: Secondary | ICD-10-CM | POA: Insufficient documentation

## 2022-12-06 DIAGNOSIS — I110 Hypertensive heart disease with heart failure: Secondary | ICD-10-CM | POA: Diagnosis not present

## 2022-12-06 DIAGNOSIS — E119 Type 2 diabetes mellitus without complications: Secondary | ICD-10-CM | POA: Diagnosis not present

## 2022-12-06 DIAGNOSIS — I5022 Chronic systolic (congestive) heart failure: Secondary | ICD-10-CM

## 2022-12-06 DIAGNOSIS — R1033 Periumbilical pain: Secondary | ICD-10-CM | POA: Diagnosis not present

## 2022-12-06 DIAGNOSIS — I447 Left bundle-branch block, unspecified: Secondary | ICD-10-CM | POA: Diagnosis not present

## 2022-12-06 DIAGNOSIS — Z7902 Long term (current) use of antithrombotics/antiplatelets: Secondary | ICD-10-CM | POA: Insufficient documentation

## 2022-12-06 DIAGNOSIS — I080 Rheumatic disorders of both mitral and aortic valves: Secondary | ICD-10-CM | POA: Diagnosis not present

## 2022-12-06 DIAGNOSIS — N200 Calculus of kidney: Secondary | ICD-10-CM | POA: Diagnosis not present

## 2022-12-06 DIAGNOSIS — Z79899 Other long term (current) drug therapy: Secondary | ICD-10-CM | POA: Insufficient documentation

## 2022-12-06 LAB — BASIC METABOLIC PANEL
Anion gap: 7 (ref 5–15)
BUN: 30 mg/dL — ABNORMAL HIGH (ref 8–23)
CO2: 21 mmol/L — ABNORMAL LOW (ref 22–32)
Calcium: 9.6 mg/dL (ref 8.9–10.3)
Chloride: 112 mmol/L — ABNORMAL HIGH (ref 98–111)
Creatinine, Ser: 0.93 mg/dL (ref 0.44–1.00)
GFR, Estimated: >60 mL/min (ref >60)
Glucose, Bld: 117 mg/dL — ABNORMAL HIGH (ref 70–99)
Potassium: 4.7 mmol/L (ref 3.5–5.1)
Sodium: 140 mmol/L (ref 135–145)

## 2022-12-06 LAB — ECHOCARDIOGRAM COMPLETE
AR max vel: 0.98 cm2
AV Area VTI: 1.01 cm2
AV Area mean vel: 0.95 cm2
AV Mean grad: 22 mm[Hg]
AV Peak grad: 37.9 mm[Hg]
Ao pk vel: 3.08 m/s
Area-P 1/2: 2.16 cm2
S' Lateral: 3.1 cm

## 2022-12-06 LAB — BRAIN NATRIURETIC PEPTIDE: B Natriuretic Peptide: 84.3 pg/mL (ref 0.0–100.0)

## 2022-12-06 NOTE — Progress Notes (Signed)
Advanced [facility] Failure Clinic Note   PCP: Dr. Yetta Flock Primary Cardiologist: Dr. Bing Matter HF Cardiologist: Dr. Gala Romney  HPI: 77 y/o female w/ h/o medically managed CAD, LBBB, aortic stenosis, T2DM, HTN, HLD, h/o CVA on aspirin and GIBs (per report sounds like AVMs s/p cauterization). Also w/ fibromyalgia and severe back pain from spinal stenosis limiting mobility.    Had recent admission 5/1-05/17/22 for chest pain. LHC showed moderate 2-vessel disease with mid LAD 65% and distal Cx 65%. At that point, opted for medical management given PCI of the LAD would require long stent and jailing large D2. 2D Echo showed normal EF, mild MS, moderate AS (PV = 3, AVA = 1.1, MG = 20, PG = 34, DI = 0.39).    Presented to ED by EMS 8/24 w/ acute hypoxic respiratory failure and AMS. Became unresponsive and ultimately intubated. Was in shock and required pressor support. Shock thought to be primarily septic w/ presumed UTI as source.   Course c/b acute on chronic CHF w/ newly reduced EF. Echo with drop in LVEF down to 25% (normal in May), global HK, normal RV, mod MR + mod calcified aortic valve w/ estimated gradient 11.0 mmHg. Subsequently underwent R/LHC which demonstrated unchanged moderate multivessel disease. RHC: RA 12, PAP 54/28(40), PCWP 24, LVEDP 30, Fick cardiac output of 7.8 L/min and Fick cardiac index of 4.1 L/min/m with thermodilution cardiac output of 6.3 L/min and thermodilution cardiac index of 3.3 L/min/m.    cMRI 09/03/22 demonstrated LVEF 36%, RVEF 37%, visually moderate AS, LGE pattern suggesting possible prior MI in LCX territory. TEE confirmed just mild to moderate AS and mild MR with LVEF 35-40%  She is here for f/u with her daughter. Feels much better. Works with the [employer]. Working at [employer] shows. Walks with walker. Rollator. Dyspnea with mild exertion. Occasional chest twinge. No angina. No orthopnea or PND, Didn't tolerate carvedilol due to low BP. BP at home 110-120/60    Echo today 12/06/2022 EF 60-65% + LVH mod AS mean 22 AVA 1.01cm    ROS: All systems negative except as listed in HPI, PMH and Problem List.  SH:  Social History   Socioeconomic History   Marital status: Married    Spouse name: Not on file   Number of children: Not on file   Years of education: Not on file   Highest education level: Not on file  Occupational History   Not on file  Tobacco Use   Smoking status: Never   Smokeless tobacco: Never  Vaping Use   Vaping status: Never Used  Substance and Sexual Activity   Alcohol use: Not Currently   Drug use: Never   Sexual activity: Not on file  Other Topics Concern   Not on file  Social History Narrative   Not on file   Social Determinants of Health   Financial Resource Strain: Not on file  Food Insecurity: No Food Insecurity (05/15/2022)   Hunger Vital Sign    Worried About Running Out of Food in the Last Year: Never true    Ran Out of Food in the Last Year: Never true  Transportation Needs: No Transportation Needs (05/15/2022)   PRAPARE - Administrator, Civil Service (Medical): No    Lack of Transportation (Non-Medical): No  Physical Activity: Not on file  Stress: Not on file  Social Connections: Not on file  Intimate Partner Violence: Not At Risk (05/15/2022)   Humiliation, Afraid, Rape, and Kick questionnaire  Fear of Current or Ex-Partner: No    Emotionally Abused: No    Physically Abused: No    Sexually Abused: No    FH:  Family History  Problem Relation Age of Onset   Cancer Mother    Hypertension Mother    Stroke Mother    Diabetes Mother    Heart failure Mother    Hypertension Father    Heart disease Father    Colon cancer Neg Hx    Esophageal cancer Neg Hx    Stomach cancer Neg Hx    Rectal cancer Neg Hx    Colon polyps Neg Hx     Past Medical History:  Diagnosis Date   AKI (acute kidney injury) (HCC) 03/27/2021   Angina pectoris (HCC) 05/15/2022   Aortic stenosis 09/04/2019    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 sciatica 02/12/2022   Class 3 obesity 03/26/2021   CVA (cerebral vascular accident) (HCC) 2015?   Cystocele with uterine prolapse 01/17/2018   Diabetes (HCC)    Dizziness 03/27/2021   Dyslipidemia 10/10/2018   Essential hypertension 10/10/2018   Fibromyalgia    GERD (gastroesophageal reflux disease)    Heart murmur 10/10/2018   History of CVA (cerebrovascular accident) 10/10/2018   Hydronephrosis of right kidney 03/26/2021   Hypercholesterolemia    Hypertension    Hypokalemia 03/27/2021   Leukocytosis    Mitral regurgitation 11/24/2018   Nonsustained ventricular tachycardia (HCC) 11/24/2018   Obesity    Post-operative nausea and vomiting    "hard to wake up"   Rectocele 01/17/2018   Sacroiliitis (HCC) 02/12/2022   Sepsis secondary to UTI (HCC) 03/26/2021   Type 2 diabetes mellitus with hyperglycemia (HCC) 03/26/2021   Type 2 diabetes mellitus without complication, without long-term current use of insulin (HCC) 10/10/2018    Current Outpatient Medications  Medication Sig Dispense Refill   acetaminophen (TYLENOL) 650 MG CR tablet Take 650 mg by mouth daily as needed for pain.     alendronate (FOSAMAX) 35 MG tablet Take 35 mg by mouth every 7 (seven) days.     atorvastatin (LIPITOR) 80 MG tablet Take 1 tablet (80 mg total) by mouth daily. 30 tablet 3   brimonidine (ALPHAGAN) 0.15 % ophthalmic solution Place 1 drop into both eyes 2 (two) times daily.     cholestyramine (QUESTRAN) 4 g packet Take 1 packet (4 g total) by mouth daily. Take at least 2 hours before or after rest of the medications (Patient taking differently: Take 4 g by mouth every 3 (three) days.) 30 each 11   clopidogrel (PLAVIX) 75 MG tablet Take 1 tablet (75 mg total) by mouth daily. 30 tablet 3   Cyanocobalamin (VITAMIN B-12 PO) Take 1 tablet by mouth daily in the afternoon.     diclofenac Sodium (VOLTAREN) 1 % GEL Apply 2 g topically at bedtime as needed (knee pain).      fenofibrate (TRICOR) 145 MG tablet Take 145 mg by mouth daily.     ferrous sulfate 325 (65 FE) MG EC tablet Take 325 mg by mouth daily in the afternoon.     gabapentin (NEURONTIN) 300 MG capsule Take 300 mg by mouth 3 (three) times daily.     Infant Care Products [facility]) OINT Apply 1 application  topically 2 (two) times daily as needed (itching).     MAGNESIUM PO Take 1 tablet by mouth daily in the afternoon.     metFORMIN (GLUCOPHAGE) 1000 MG tablet Take 1,000 mg by mouth 2 (two) times daily  with a meal.     metoprolol succinate (TOPROL XL) 25 MG 24 hr tablet Take 0.5 tablets (12.5 mg total) by mouth daily. 45 tablet 3   Omega-3 Fatty Acids (FISH OIL PO) Take 1 capsule by mouth daily in the afternoon.     OVER THE COUNTER MEDICATION Apply 1 patch topically daily as needed (back pain). Coralite pain patch     pantoprazole (PROTONIX) 40 MG tablet Take 40 mg by mouth daily.     sacubitril-valsartan (ENTRESTO) 24-26 MG Take 1 tablet by mouth 2 (two) times daily. 180 tablet 1   sertraline (ZOLOFT) 50 MG tablet Take 50 mg by mouth daily.     spironolactone (ALDACTONE) 25 MG tablet Take 1 tablet (25 mg total) by mouth daily. 60 tablet 0   tiZANidine (ZANAFLEX) 2 MG tablet Take 2 mg by mouth every 6 (six) hours as needed for muscle spasms.     TURMERIC PO Take 1 tablet by mouth daily in the afternoon.     No current facility-administered medications for this encounter.    Vitals:   12/06/22 1111  BP: (!) 146/86  Pulse: 88  SpO2: 97%  Weight: 95.1 kg (209 lb 9.6 oz)    PHYSICAL EXAM:  General:  Obese woman No resp difficulty HEENT: normal Neck: supple. no JVD. Carotids 2+ bilat; + bruits. No lymphadenopathy or thryomegaly appreciated. Cor: PMI nondisplaced. Regular rate & rhythm.3/6 AS mildly deprressed s2  Lungs: clear Abdomen: obese soft, nontender, nondistended. No hepatosplenomegaly. No bruits or masses. Good bowel sounds. Extremities: no cyanosis, clubbing, rash, edema Neuro:  alert & orientedx3, cranial nerves grossly intact. moves all 4 extremities w/o difficulty. Affect pleasant   ASSESSMENT & PLAN:   1. Chronic systolic CHF with recovered EF:  -Prior echo with normal EF in 5/24.  -Echo 08/24 with EF 25%, RV okay, moderate MR, moderate low flow/low gradient AS with AVA 1.1 cm^2, IVC dilated.   -LHC 08/24:diffuse nonobstructive multivessel CAD (stable from cath in 05/24) -cMRI 08/24: LGE pattern in basal to mid inferolateral wall suggesting possible prior MI in LCX territory, LVEF 36%, RVEF 37%, low likelihood of infiltrative disease -TEE 09/03/22: LVEF 35-40%, mild to moderate AS and mild MR -Has chronic LBBB -Echo today 12/06/2022 EF 60-65% + LVH mod AS mean 22 AVA 1.01cm  -NYHA III, confounded by impaired mobility from spinal stenosis and obesity. Volume looks good -Continue Entresto 24/26 bid. -Continue Spironolactone 25 mg daily.  -No Farxiga with hx of multiple admissions with UTIs  -GDMT previously limited by orthostatic dizziness. BP stable today.  -Previously concerned about possible LBBB CM but EF has recovered so will not refer for CRT at this point - Check labs today  2. Aortic stenosis: - Moderate AS on echo today 12/06/22. Follow for possible TAVR  3. Mitral regurgitation:  - mild on echo  4. CAD:  - Stable, moderate multivessel disease on cath 08/24, did not seem to explain fall in EF.  No culprit lesion to treat. cMRI with evidence of possible prior infarct in LCX territory - No s/s angina - Continue Plavix + statin.   Gloria Meres, MD  11:48 PM

## 2022-12-06 NOTE — Progress Notes (Signed)
  Echocardiogram 2D Echocardiogram has been performed.  Gloria Sharp 12/06/2022, 11:06 AM

## 2022-12-06 NOTE — Patient Instructions (Signed)
Medication Changes:  NO CHANGES  Lab Work:  Labs done today, your results will be available in MyChart, we will contact you for abnormal readings.    Follow-Up in: 6 MONTHS Call our office in March 2025 to schedule   At the [facility], you and your health needs are our priority. We have a designated team specialized in the treatment of Heart Failure. This Care Team includes your primary Heart Failure Specialized Cardiologist (physician), Advanced Practice Providers (APPs- Physician Assistants and Nurse Practitioners), and Pharmacist who all work together to provide you with the care you need, when you need it.   You may see any of the following providers on your designated Care Team at your next follow up:  Dr. Arvilla Meres Dr. Marca Ancona Dr. Dorthula Nettles Dr. Theresia Bough Tonye Becket, NP Robbie Lis, [place] [facility] [place], [place] Brynda Peon, NP [place] Lee, NP Karle Plumber, PharmD   Please be sure to bring in all your medications bottles to every appointment.   Need to Contact us:  If you have any questions or concerns before your next appointment please send [place] a message through [place] or call our office at [phone].    TO LEAVE A MESSAGE FOR THE NURSE SELECT OPTION 2, PLEASE LEAVE A MESSAGE INCLUDING: YOUR NAME DATE OF BIRTH CALL BACK NUMBER REASON FOR CALL**this is important as we prioritize the call backs  YOU WILL RECEIVE A CALL BACK THE SAME DAY AS LONG AS YOU CALL BEFORE 4:00 PM

## 2022-12-16 DIAGNOSIS — F411 Generalized anxiety disorder: Secondary | ICD-10-CM | POA: Diagnosis not present

## 2022-12-16 DIAGNOSIS — E782 Mixed hyperlipidemia: Secondary | ICD-10-CM | POA: Diagnosis not present

## 2022-12-18 ENCOUNTER — Encounter: Payer: Self-pay | Admitting: Cardiology

## 2022-12-18 ENCOUNTER — Ambulatory Visit: Payer: Medicare PPO | Attending: Cardiology | Admitting: Cardiology

## 2022-12-18 VITALS — BP 169/86 | HR 94 | Ht 61.0 in | Wt 210.0 lb

## 2022-12-18 DIAGNOSIS — I5022 Chronic systolic (congestive) heart failure: Secondary | ICD-10-CM

## 2022-12-18 DIAGNOSIS — I10 Essential (primary) hypertension: Secondary | ICD-10-CM | POA: Diagnosis not present

## 2022-12-18 DIAGNOSIS — I350 Nonrheumatic aortic (valve) stenosis: Secondary | ICD-10-CM

## 2022-12-18 NOTE — Patient Instructions (Signed)
Medication Instructions:  Your physician recommends that you continue on your current medications as directed. Please refer to the Current Medication list given to you today.  *If you need a refill on your cardiac medications before your next appointment, please call your pharmacy*   Lab Work: None Ordered If you have labs (blood work) drawn today and your tests are completely normal, you will receive your results only by: MyChart Message (if you have MyChart) OR A paper copy in the mail If you have any lab test that is abnormal or we need to change your treatment, we will call you to review the results.   Testing/Procedures: None Ordered   Follow-Up: At [facility], you and your health needs are our priority.  As part of our continuing mission to provide you with exceptional heart care, we have created designated Provider Care Teams.  These Care Teams include your primary Cardiologist (physician) and Advanced Practice Providers (APPs -  Physician Assistants and Nurse Practitioners) who all work together to provide you with the care you need, when you need it.  We recommend signing up for the patient portal called "MyChart".  Sign up information is provided on this After Visit Summary.  MyChart is used to connect with patients for Virtual Visits (Telemedicine).  Patients are able to view lab/test results, encounter notes, upcoming appointments, etc.  Non-urgent messages can be sent to your provider as well.   To learn more about what you can do with MyChart, go to [email or web address].    Your next appointment:   2 month(s)- Beginning of February  The format for your next appointment:   In Person  Provider:   Gypsy Balsam, MD    Other Instructions NA

## 2022-12-18 NOTE — Progress Notes (Unsigned)
Cardiology Office Note:    Date:  12/18/2022   ID:  JWAN YELL, DOB 1945/07/11, MRN [id number]  PCP:  Abner Greenspan, MD  Cardiologist:  Gypsy Balsam, MD    Referring MD: Abner Greenspan, MD   No chief complaint on file.   History of Present Illness:    Gloria Sharp is a 77 y.o. female history significant for coronary artery disease, she did have cardiac catheterization done recently which showed two-vessel disease with mild LV 65% distal circumflex 65 felt to be best managed with medications, essential hypertension, history of CVA, recently ended up being in the hospital because of respiratory arrest.  After that she was noted to have severely reduced left ventricular ejection fraction with ejection fraction down to 25%, that prompt cardiac catheterization that he has been described above, she was put on guideline directed medical therapy and her echocardiogram normalized at last check just last week. She comes today to months for follow-up overall doing fine denies have any chest pain tightness squeezing pressure burning chest.  Shortness of breath still there but nothing unusual.  Past Medical History:  Diagnosis Date   AKI (acute kidney injury) (HCC) 03/27/2021   Angina pectoris (HCC) 05/15/2022   Aortic stenosis 09/04/2019   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 sciatica 02/12/2022   Class 3 obesity 03/26/2021   CVA (cerebral vascular accident) (HCC) 2015?   Cystocele with uterine prolapse 01/17/2018   Diabetes (HCC)    Dizziness 03/27/2021   Dyslipidemia 10/10/2018   Essential hypertension 10/10/2018   Fibromyalgia    GERD (gastroesophageal reflux disease)    Heart murmur 10/10/2018   History of CVA (cerebrovascular accident) 10/10/2018   Hydronephrosis of right kidney 03/26/2021   Hypercholesterolemia    Hypertension    Hypokalemia 03/27/2021   Leukocytosis    Mitral regurgitation 11/24/2018   Nonsustained ventricular tachycardia (HCC) 11/24/2018   Obesity     Post-operative nausea and vomiting    "hard to wake up"   Rectocele 01/17/2018   Sacroiliitis (HCC) 02/12/2022   Sepsis secondary to UTI (HCC) 03/26/2021   Type 2 diabetes mellitus with hyperglycemia (HCC) 03/26/2021   Type 2 diabetes mellitus without complication, without long-term current use of insulin (HCC) 10/10/2018    Past Surgical History:  Procedure Laterality Date   ABDOMINAL HYSTERECTOMY     partial, left the ovaries   APPENDECTOMY     BREAST CYST ASPIRATION Right    COLONOSCOPY  11/11/2013   Colonioc polyps status post polypectomy. Pancolonic diverticulosis predominately in the sigmoid colon   CYSTOSCOPY W/ URETERAL STENT PLACEMENT Right 03/26/2021   Procedure: CYSTOSCOPY WITH RETROGRADE PYELOGRAM/URETERAL STENT PLACEMENT;  Surgeon: Jannifer Hick, MD;  Location: [facility];  Service: Urology;  Laterality: Right;   CYSTOSCOPY/URETEROSCOPY/HOLMIUM LASER/STENT PLACEMENT Right 04/17/2021   Procedure: CYSTOSCOPY/ RETROGRADE/URETEROSCOPY/HOLMIUM LASER/STENT PLACEMENT;  Surgeon: Jannifer Hick, MD;  Location: [facility];  Service: Urology;  Laterality: Right;  ONLY NEEDS 60 MIN   DILATION AND CURETTAGE OF UTERUS     GALLBLADDER SURGERY Right 07/2014   HERNIA REPAIR  02/20/2021   Umbilical   LEFT HEART CATH AND CORONARY ANGIOGRAPHY N/A 05/16/2022   Procedure: LEFT HEART CATH AND CORONARY ANGIOGRAPHY;  Surgeon: Iran Ouch, MD;  Location: [facility];  Service: Cardiovascular;  Laterality: N/A;   RIGHT/LEFT HEART CATH AND CORONARY ANGIOGRAPHY N/A 08/30/2022   Procedure: RIGHT/LEFT HEART CATH AND CORONARY ANGIOGRAPHY;  Surgeon: Orbie Pyo, MD;  Location: [facility];  Service: Cardiovascular;  Laterality: N/A;   TEE WITHOUT CARDIOVERSION N/A 09/03/2022   Procedure: TRANSESOPHAGEAL ECHOCARDIOGRAM;  Surgeon: Dorthula Nettles, DO;  Location: [facility];  Service: Cardiovascular;  Laterality: N/A;   vaginal polyp removal      Current Medications: Current Meds   Medication Sig   acetaminophen (TYLENOL) 650 MG CR tablet Take 650 mg by mouth daily as needed for pain.   alendronate (FOSAMAX) 35 MG tablet Take 35 mg by mouth every 7 (seven) days.   atorvastatin (LIPITOR) 80 MG tablet Take 1 tablet (80 mg total) by mouth daily.   brimonidine (ALPHAGAN) 0.15 % ophthalmic solution Place 1 drop into both eyes 2 (two) times daily.   cholestyramine (QUESTRAN) 4 g packet Take 1 packet (4 g total) by mouth daily. Take at least 2 hours before or after rest of the medications   clopidogrel (PLAVIX) 75 MG tablet Take 1 tablet (75 mg total) by mouth daily.   Cyanocobalamin (VITAMIN B-12 PO) Take 1 tablet by mouth daily in the afternoon.   diclofenac Sodium (VOLTAREN) 1 % GEL Apply 2 g topically at bedtime as needed (knee pain).   fenofibrate (TRICOR) 145 MG tablet Take 145 mg by mouth daily.   ferrous sulfate 325 (65 FE) MG EC tablet Take 325 mg by mouth daily in the afternoon.   gabapentin (NEURONTIN) 300 MG capsule Take 300 mg by mouth 3 (three) times daily.   Infant Care Products [facility]) OINT Apply 1 application  topically 2 (two) times daily as needed (itching).   MAGNESIUM PO Take 1 tablet by mouth daily in the afternoon.   metFORMIN (GLUCOPHAGE) 1000 MG tablet Take 1,000 mg by mouth 2 (two) times daily with a meal.   metoprolol succinate (TOPROL XL) 25 MG 24 hr tablet Take 0.5 tablets (12.5 mg total) by mouth daily.   Omega-3 Fatty Acids (FISH OIL PO) Take 1 capsule by mouth daily in the afternoon.   OVER THE COUNTER MEDICATION Apply 1 patch topically daily as needed (back pain). Coralite pain patch   pantoprazole (PROTONIX) 40 MG tablet Take 40 mg by mouth daily.   sacubitril-valsartan (ENTRESTO) 24-26 MG Take 1 tablet by mouth 2 (two) times daily.   sertraline (ZOLOFT) 50 MG tablet Take 50 mg by mouth daily.   spironolactone (ALDACTONE) 25 MG tablet Take 1 tablet (25 mg total) by mouth daily.   tiZANidine (ZANAFLEX) 2 MG tablet Take 2 mg by mouth every  6 (six) hours as needed for muscle spasms.   TURMERIC PO Take 1 tablet by mouth daily in the afternoon.     Allergies:   Celebrex [celecoxib], Keflex [cephalexin], Relafen [nabumetone], Cipro [ciprofloxacin hcl], and Flagyl [metronidazole]   Social History   Socioeconomic History   Marital status: Married    Spouse name: Not on file   Number of children: Not on file   Years of education: Not on file   Highest education level: Not on file  Occupational History   Not on file  Tobacco Use   Smoking status: Never   Smokeless tobacco: Never  Vaping Use   Vaping status: Never Used  Substance and Sexual Activity   Alcohol use: Not Currently   Drug use: Never   Sexual activity: Not on file  Other Topics Concern   Not on file  Social History Narrative   Not on file   Social Determinants of Health   Financial Resource Strain: Not on file  Food Insecurity: No Food Insecurity (05/15/2022)   Hunger Vital  Sign    Worried About Programme researcher, broadcasting/film/video in the Last Year: Never true    Ran Out of Food in the Last Year: Never true  Transportation Needs: No Transportation Needs (05/15/2022)   PRAPARE - Administrator, Civil Service (Medical): No    Lack of Transportation (Non-Medical): No  Physical Activity: Not on file  Stress: Not on file  Social Connections: Not on file     Family History: The patient's family history includes Cancer in her mother; Diabetes in her mother; Heart disease in her father; Heart failure in her mother; Hypertension in her father and mother; Stroke in her mother. There is no history of Colon cancer, Esophageal cancer, Stomach cancer, Rectal cancer, or Colon polyps. ROS:   Please see the history of present illness.    All 14 point review of systems negative except as described per history of present illness  EKGs/Labs/Other Studies Reviewed:         Recent Labs: 05/15/2022: TSH 1.085 09/05/2022: ALT 30; Hemoglobin 9.4; Magnesium 1.6; Platelets  357 12/06/2022: B Natriuretic Peptide 84.3; BUN 30; Creatinine, Ser 0.93; Potassium 4.7; Sodium 140  Recent Lipid Panel    Component Value Date/Time   CHOL 127 05/16/2022 0059   TRIG 189 (H) 09/03/2022 0428   HDL 38 (L) 05/16/2022 0059   CHOLHDL 3.3 05/16/2022 0059   VLDL 36 05/16/2022 0059   LDLCALC 53 05/16/2022 0059    Physical Exam:    VS:  BP (!) 169/86   Pulse 94   Ht 5\' 1"  (1.549 m)   Wt 210 lb (95.3 kg)   SpO2 95%   BMI 39.68 kg/m     Wt Readings from Last 3 Encounters:  12/18/22 210 lb (95.3 kg)  12/06/22 209 lb 9.6 oz (95.1 kg)  10/10/22 207 lb 6.4 oz (94.1 kg)     GEN:  Well nourished, well developed in no acute distress HEENT: Normal NECK: No JVD; No carotid bruits LYMPHATICS: No lymphadenopathy CARDIAC: RRR, systolic ejection murmur grade 3/6 best heard right upper portion of the sternum, S2 is still present, no rubs, no gallops RESPIRATORY:  Clear to auscultation without rales, wheezing or rhonchi  ABDOMEN: Soft, non-tender, non-distended MUSCULOSKELETAL:  No edema; No deformity  SKIN: Warm and dry LOWER EXTREMITIES: no swelling NEUROLOGIC:  Alert and oriented x 3 PSYCHIATRIC:  Normal affect   ASSESSMENT:    1. Nonrheumatic aortic valve stenosis   2. Chronic systolic congestive heart failure (HCC)   3. Essential hypertension    PLAN:    In order of problems listed above:  Aortic stenosis last assessment last week only moderate.  Will continue monitoring I did explain to her was the signs and symptoms of worsening aortic stenosis mean chest pain, shortness of breath, dizziness and passing out or ask him to let me know if you develop any of those symptoms. History of cardiomyopathy with diminished ejection fraction now normalization it is nonischemic, cardiac catheterization performed and reviewed with the patient.  Nonobstructive disease clearly does not justify degree of cardiomyopathy she developed.  She was put but our congestive heart failure team  on guideline directed medical therapy and normalized her ejection fraction based on last echo done last week. Essential hypertension blood pressure elevated in the office but she said she checked blood pressure at home it is always 1 20-1 30 systolic with 50-60 diastolic.  Will continue monitoring. Dyslipidemia I did review K PN which show her LDL of 29 HDL  37 we will continue present management.   Medication Adjustments/Labs and Tests Ordered: Current medicines are reviewed at length with the patient today.  Concerns regarding medicines are outlined above.  No orders of the defined types were placed in this encounter.  Medication changes: No orders of the defined types were placed in this encounter.   Signed, Georgeanna Lea, MD, [facility] 12/18/2022 4:51 PM    [facility]

## 2022-12-24 DIAGNOSIS — E1129 Type 2 diabetes mellitus with other diabetic kidney complication: Secondary | ICD-10-CM | POA: Diagnosis not present

## 2022-12-24 DIAGNOSIS — E1159 Type 2 diabetes mellitus with other circulatory complications: Secondary | ICD-10-CM | POA: Diagnosis not present

## 2022-12-24 DIAGNOSIS — E782 Mixed hyperlipidemia: Secondary | ICD-10-CM | POA: Diagnosis not present

## 2023-01-04 DIAGNOSIS — E782 Mixed hyperlipidemia: Secondary | ICD-10-CM | POA: Diagnosis not present

## 2023-01-04 DIAGNOSIS — Z23 Encounter for immunization: Secondary | ICD-10-CM | POA: Diagnosis not present

## 2023-01-04 DIAGNOSIS — N1831 Chronic kidney disease, stage 3a: Secondary | ICD-10-CM | POA: Diagnosis not present

## 2023-01-04 DIAGNOSIS — Z6839 Body mass index (BMI) 39.0-39.9, adult: Secondary | ICD-10-CM | POA: Diagnosis not present

## 2023-01-04 DIAGNOSIS — E1159 Type 2 diabetes mellitus with other circulatory complications: Secondary | ICD-10-CM | POA: Diagnosis not present

## 2023-01-04 DIAGNOSIS — I152 Hypertension secondary to endocrine disorders: Secondary | ICD-10-CM | POA: Diagnosis not present

## 2023-01-04 DIAGNOSIS — Z1231 Encounter for screening mammogram for malignant neoplasm of breast: Secondary | ICD-10-CM | POA: Diagnosis not present

## 2023-01-16 DIAGNOSIS — E782 Mixed hyperlipidemia: Secondary | ICD-10-CM | POA: Diagnosis not present

## 2023-01-16 DIAGNOSIS — F411 Generalized anxiety disorder: Secondary | ICD-10-CM | POA: Diagnosis not present

## 2023-01-31 ENCOUNTER — Other Ambulatory Visit: Payer: Self-pay

## 2023-01-31 MED ORDER — ATORVASTATIN CALCIUM 80 MG PO TABS: 80.0000 mg | ORAL_TABLET | Freq: Every day | ORAL | 3 refills | Status: AC

## 2023-02-15 DIAGNOSIS — E1159 Type 2 diabetes mellitus with other circulatory complications: Secondary | ICD-10-CM | POA: Diagnosis not present

## 2023-02-16 DIAGNOSIS — E782 Mixed hyperlipidemia: Secondary | ICD-10-CM | POA: Diagnosis not present

## 2023-02-16 DIAGNOSIS — F411 Generalized anxiety disorder: Secondary | ICD-10-CM | POA: Diagnosis not present

## 2023-02-22 ENCOUNTER — Encounter: Payer: Self-pay | Admitting: Cardiology

## 2023-02-22 ENCOUNTER — Ambulatory Visit: Payer: Medicare PPO | Attending: Cardiology | Admitting: Cardiology

## 2023-02-22 VITALS — BP 124/86 | HR 89 | Ht 60.0 in | Wt 212.6 lb

## 2023-02-22 DIAGNOSIS — I350 Nonrheumatic aortic (valve) stenosis: Secondary | ICD-10-CM

## 2023-02-22 DIAGNOSIS — I340 Nonrheumatic mitral (valve) insufficiency: Secondary | ICD-10-CM | POA: Diagnosis not present

## 2023-02-22 DIAGNOSIS — I10 Essential (primary) hypertension: Secondary | ICD-10-CM

## 2023-02-22 DIAGNOSIS — R0609 Other forms of dyspnea: Secondary | ICD-10-CM

## 2023-02-22 NOTE — Patient Instructions (Signed)
 Medication Instructions:  Your physician recommends that you continue on your current medications as directed. Please refer to the Current Medication list given to you today.  *If you need a refill on your cardiac medications before your next appointment, please call your pharmacy*   Lab Work: None Ordered If you have labs (blood work) drawn today and your tests are completely normal, you will receive your results only by: MyChart Message (if you have MyChart) OR A paper copy in the mail If you have any lab test that is abnormal or we need to change your treatment, we will call you to review the results.   Testing/Procedures: Your physician has requested that you have an echocardiogram. Echocardiography is a painless test that uses sound waves to create images of your heart. It provides your doctor with information about the size and shape of your heart and how well your heart's chambers and valves are working. This procedure takes approximately one hour. There are no restrictions for this procedure. Please do NOT wear cologne, perfume, aftershave, or lotions (deodorant is allowed). Please arrive 15 minutes prior to your appointment time.  Please note: We ask at that you not bring children with you during ultrasound (echo/ vascular) testing. Due to room size and safety concerns, children are not allowed in the ultrasound rooms during exams. Our front office staff cannot provide observation of children in our lobby area while testing is being conducted. An adult accompanying a patient to their appointment will only be allowed in the ultrasound room at the discretion of the ultrasound technician under special circumstances. We apologize for any inconvenience.    Follow-Up: At [facility], you and your health needs are our priority.  As part of our continuing mission to provide you with exceptional heart care, we have created designated Provider Care Teams.  These Care Teams include your  primary Cardiologist (physician) and Advanced Practice Providers (APPs -  Physician Assistants and Nurse Practitioners) who all work together to provide you with the care you need, when you need it.  We recommend signing up for the patient portal called "MyChart".  Sign up information is provided on this After Visit Summary.  MyChart is used to connect with patients for Virtual Visits (Telemedicine).  Patients are able to view lab/test results, encounter notes, upcoming appointments, etc.  Non-urgent messages can be sent to your provider as well.   To learn more about what you can do with MyChart, go to [email or web address].    Your next appointment:   2 month(s)  The format for your next appointment:   In Person  Provider:   Gypsy Balsam, MD    Other Instructions NA

## 2023-02-23 LAB — BASIC METABOLIC PANEL
BUN/Creatinine Ratio: 29 — ABNORMAL HIGH (ref 12–28)
BUN: 33 mg/dL — ABNORMAL HIGH (ref 8–27)
CO2: 16 mmol/L — ABNORMAL LOW (ref 20–29)
Calcium: 9.4 mg/dL (ref 8.7–10.3)
Chloride: 111 mmol/L — ABNORMAL HIGH (ref 96–106)
Creatinine, Ser: 1.15 mg/dL — ABNORMAL HIGH (ref 0.57–1.00)
Glucose: 115 mg/dL — ABNORMAL HIGH (ref 70–99)
Potassium: 5.5 mmol/L — ABNORMAL HIGH (ref 3.5–5.2)
Sodium: 142 mmol/L (ref 134–144)
eGFR: 50 mL/min/{1.73_m2} — ABNORMAL LOW (ref >59)

## 2023-02-23 LAB — PRO B NATRIURETIC PEPTIDE: NT-Pro BNP: 410 pg/mL (ref 0–738)

## 2023-02-26 ENCOUNTER — Telehealth: Payer: Self-pay

## 2023-02-26 NOTE — Telephone Encounter (Signed)
LVM to call regarding lab results

## 2023-03-01 DIAGNOSIS — Z20822 Contact with and (suspected) exposure to covid-19: Secondary | ICD-10-CM | POA: Diagnosis not present

## 2023-03-01 DIAGNOSIS — R059 Cough, unspecified: Secondary | ICD-10-CM | POA: Diagnosis not present

## 2023-03-01 DIAGNOSIS — I152 Hypertension secondary to endocrine disorders: Secondary | ICD-10-CM | POA: Diagnosis not present

## 2023-03-01 DIAGNOSIS — Z6841 Body Mass Index (BMI) 40.0 and over, adult: Secondary | ICD-10-CM | POA: Diagnosis not present

## 2023-03-01 DIAGNOSIS — E1159 Type 2 diabetes mellitus with other circulatory complications: Secondary | ICD-10-CM | POA: Diagnosis not present

## 2023-03-01 DIAGNOSIS — J019 Acute sinusitis, unspecified: Secondary | ICD-10-CM | POA: Diagnosis not present

## 2023-03-04 ENCOUNTER — Telehealth: Payer: Self-pay

## 2023-03-04 DIAGNOSIS — E875 Hyperkalemia: Secondary | ICD-10-CM

## 2023-03-04 MED ORDER — SPIRONOLACTONE 25 MG PO TABS: 12.5000 mg | ORAL_TABLET | Freq: Every day | ORAL | Status: DC

## 2023-03-04 NOTE — Telephone Encounter (Signed)
-----   Message from Gypsy Balsam sent at 02/26/2023 10:10 AM EST ----- Labs are fine except potassium being a little high.  Please cut down spironolactone to only 12.5 daily, Chem-7 within a week

## 2023-03-04 NOTE — Telephone Encounter (Signed)
 Letter mailed

## 2023-03-12 ENCOUNTER — Ambulatory Visit: Payer: Medicare PPO | Attending: Cardiology

## 2023-03-12 DIAGNOSIS — E875 Hyperkalemia: Secondary | ICD-10-CM | POA: Diagnosis not present

## 2023-03-12 DIAGNOSIS — R0609 Other forms of dyspnea: Secondary | ICD-10-CM | POA: Diagnosis not present

## 2023-03-12 LAB — ECHOCARDIOGRAM COMPLETE
AR max vel: 0.86 cm2
AV Area VTI: 0.82 cm2
AV Area mean vel: 0.82 cm2
AV Mean grad: 23.5 mm[Hg]
AV Peak grad: 39.1 mm[Hg]
Ao pk vel: 3.13 m/s
MV M vel: 5.78 m/s
MV Peak grad: 133.6 mm[Hg]
Radius: 0.9 cm
S' Lateral: 3.2 cm

## 2023-03-13 LAB — BASIC METABOLIC PANEL WITH GFR
BUN/Creatinine Ratio: 28 (ref 12–28)
BUN: 27 mg/dL (ref 8–27)
CO2: 21 mmol/L (ref 20–29)
Calcium: 9.3 mg/dL (ref 8.7–10.3)
Chloride: 110 mmol/L — ABNORMAL HIGH (ref 96–106)
Creatinine, Ser: 0.96 mg/dL (ref 0.57–1.00)
Glucose: 116 mg/dL — ABNORMAL HIGH (ref 70–99)
Potassium: 5.4 mmol/L — ABNORMAL HIGH (ref 3.5–5.2)
Sodium: 145 mmol/L — ABNORMAL HIGH (ref 134–144)
eGFR: 62 mL/min/1.73

## 2023-03-16 DIAGNOSIS — E782 Mixed hyperlipidemia: Secondary | ICD-10-CM | POA: Diagnosis not present

## 2023-03-16 DIAGNOSIS — F411 Generalized anxiety disorder: Secondary | ICD-10-CM | POA: Diagnosis not present

## 2023-03-21 ENCOUNTER — Telehealth: Payer: Self-pay

## 2023-03-21 NOTE — Telephone Encounter (Signed)
 Unable to reach or LM, Mailed a letter requesting a call back

## 2023-03-21 NOTE — Telephone Encounter (Signed)
-----   Message from Marlyn Corporal Madireddy sent at 03/12/2023  6:02 PM EST ----- Reviewing for Dr. Bing Matter in his absence [out on vacation]. Please inform her, echocardiogram shows normal pumping function of the heart, left ventricular ejection fraction 60 to 65%.  This is similar in comparison to prior echocardiogram from November 2024. Valve abnormalities are once again demonstrated with moderately stenotic aortic valve [narrowed opening], leaky mitral valve [moderate to severe] once again similar to prior echocardiogram.  Reviewed images from today's study on prior echocardiogram from November 2024 and TEE from August 2024.

## 2023-03-26 ENCOUNTER — Other Ambulatory Visit (HOSPITAL_COMMUNITY): Payer: Self-pay

## 2023-03-27 ENCOUNTER — Other Ambulatory Visit (HOSPITAL_COMMUNITY): Payer: Self-pay

## 2023-03-27 ENCOUNTER — Telehealth: Payer: Self-pay | Admitting: Cardiology

## 2023-03-27 DIAGNOSIS — E875 Hyperkalemia: Secondary | ICD-10-CM

## 2023-03-27 NOTE — Telephone Encounter (Signed)
 Results reviewed with pt as per Dr. Vanetta Shawl note.  Pt verbalized understanding and had no additional questions. Routed to PCP

## 2023-03-27 NOTE — Telephone Encounter (Signed)
 Patient returned staff call regarding results.

## 2023-03-29 IMAGING — MG MM DIGITAL SCREENING BILAT W/ TOMO AND CAD
6 of 10 series · 6 of 30 positions shown · non-contrast
Comparison: Previous exam(s).

CLINICAL DATA: Screening.

EXAM:
DIGITAL SCREENING BILATERAL MAMMOGRAM WITH TOMOSYNTHESIS AND CAD
TECHNIQUE: Bilateral screening digital craniocaudal and mediolateral oblique
mammograms were obtained. Bilateral screening digital breast
tomosynthesis was performed. The images were evaluated with
computer-aided detection.

[R CV synth-2D]
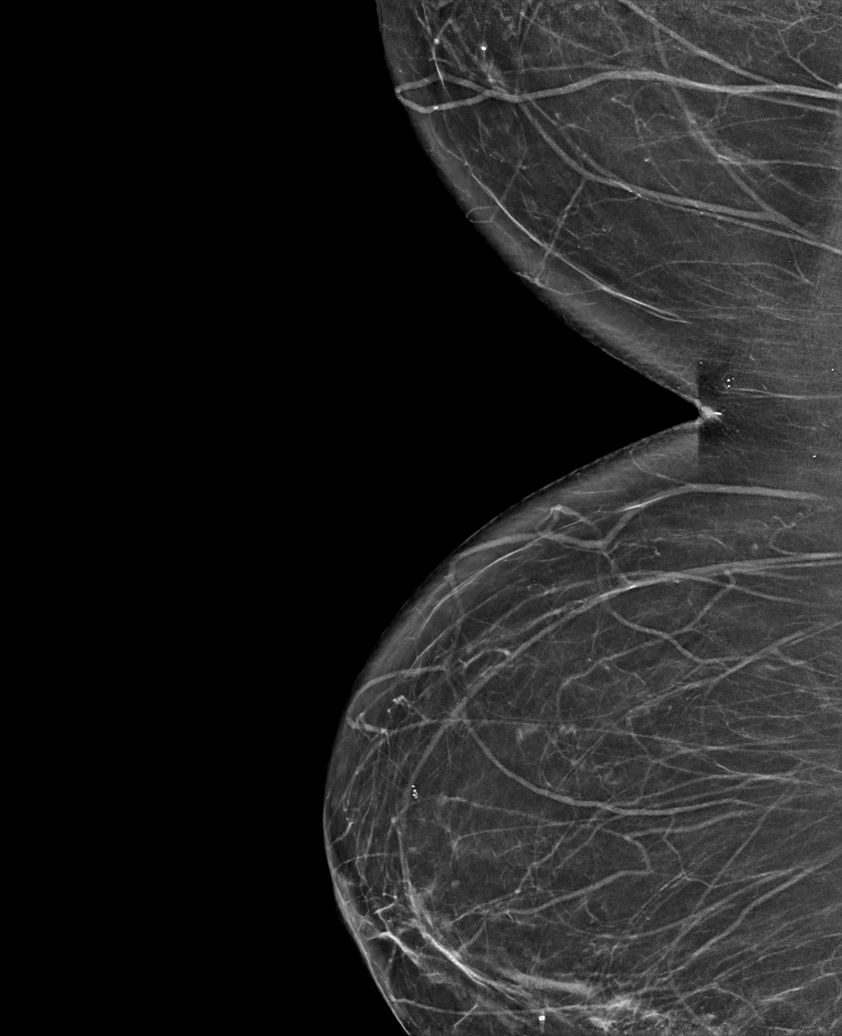

[R CC synth-2D]
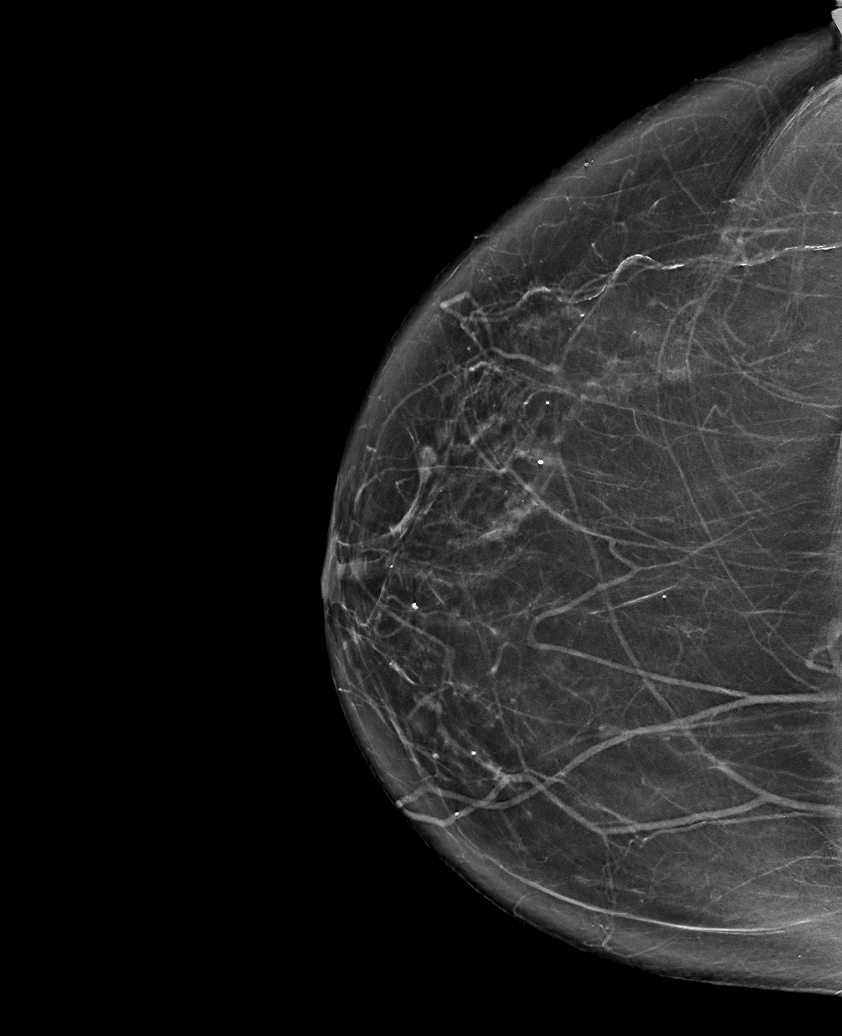

[L CC synth-2D]
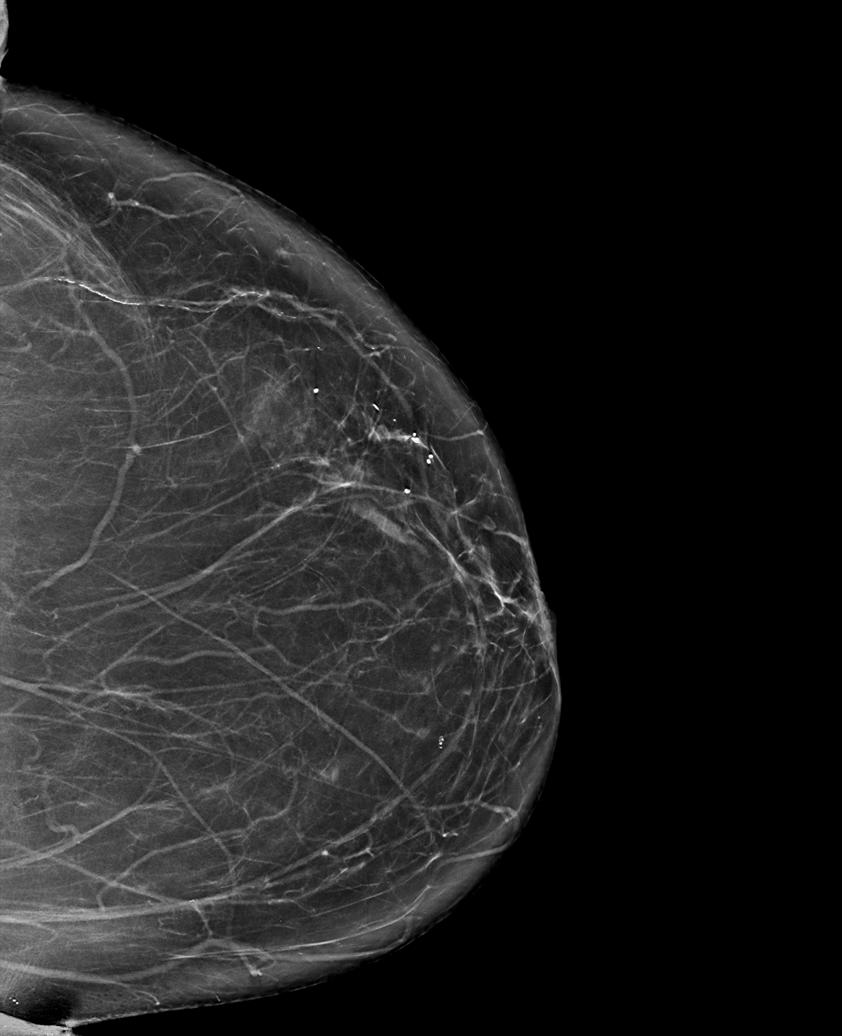

[L MLO synth-2D]
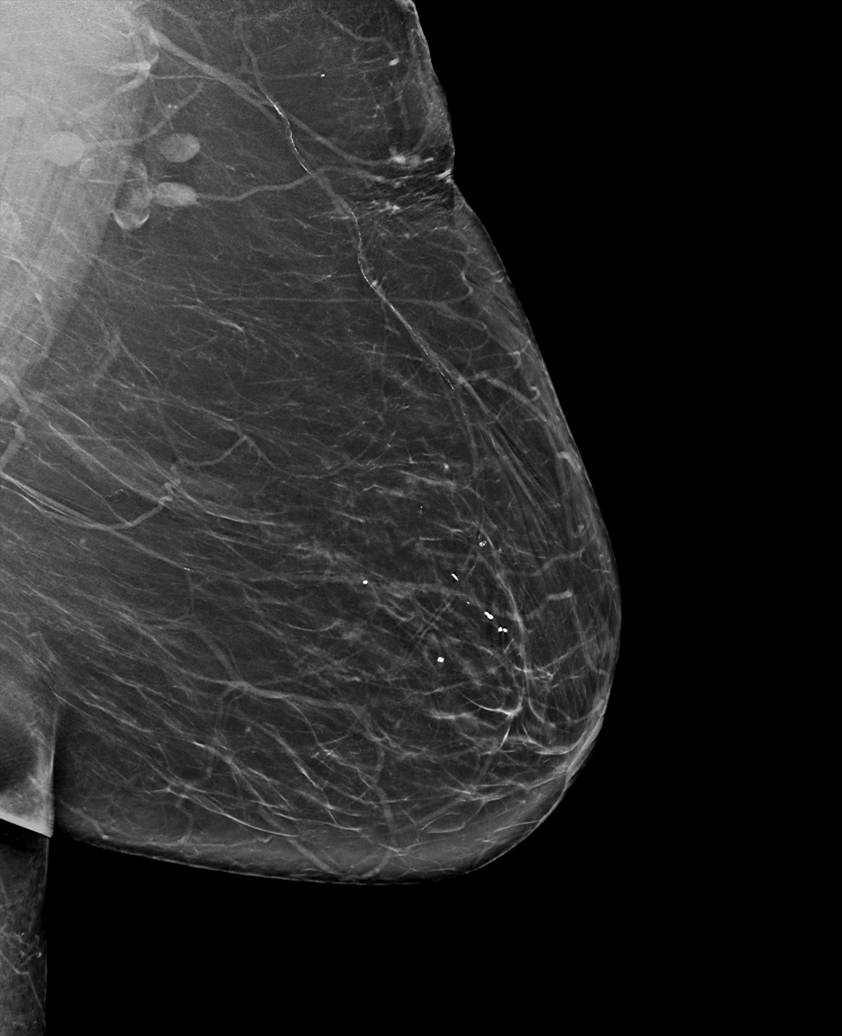

[R MLO synth-2D]
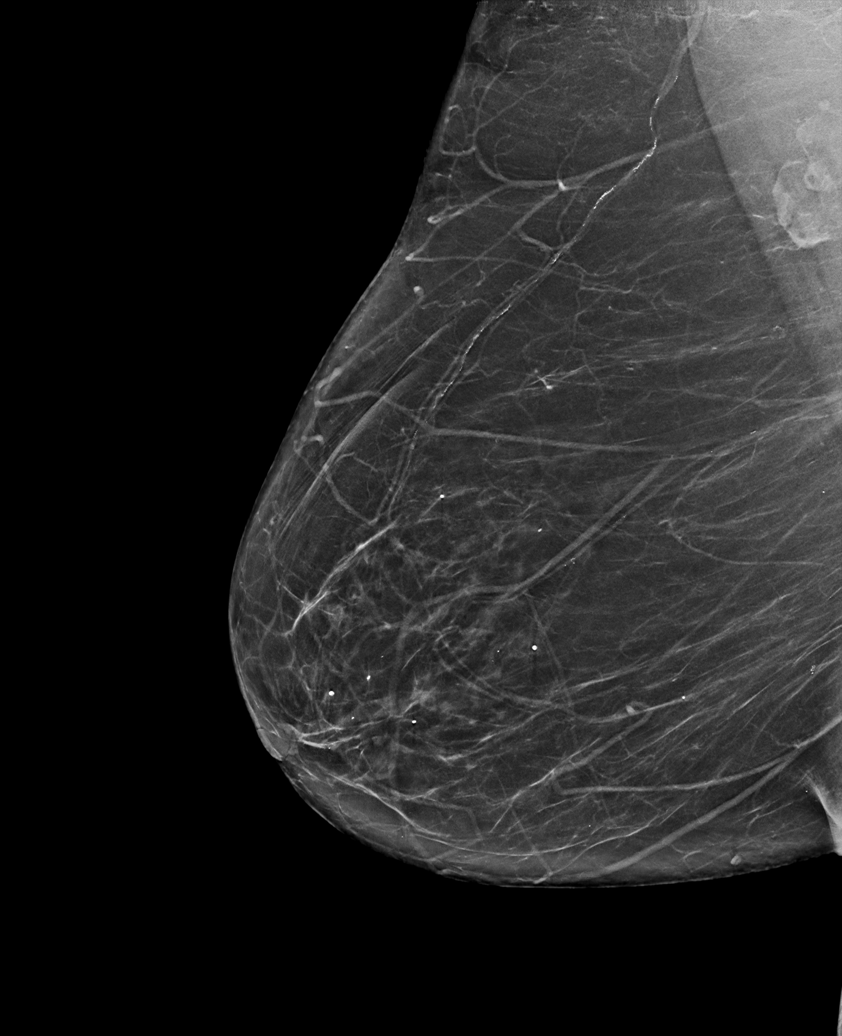

[R CC tomo · tomo slice 33/66.0]
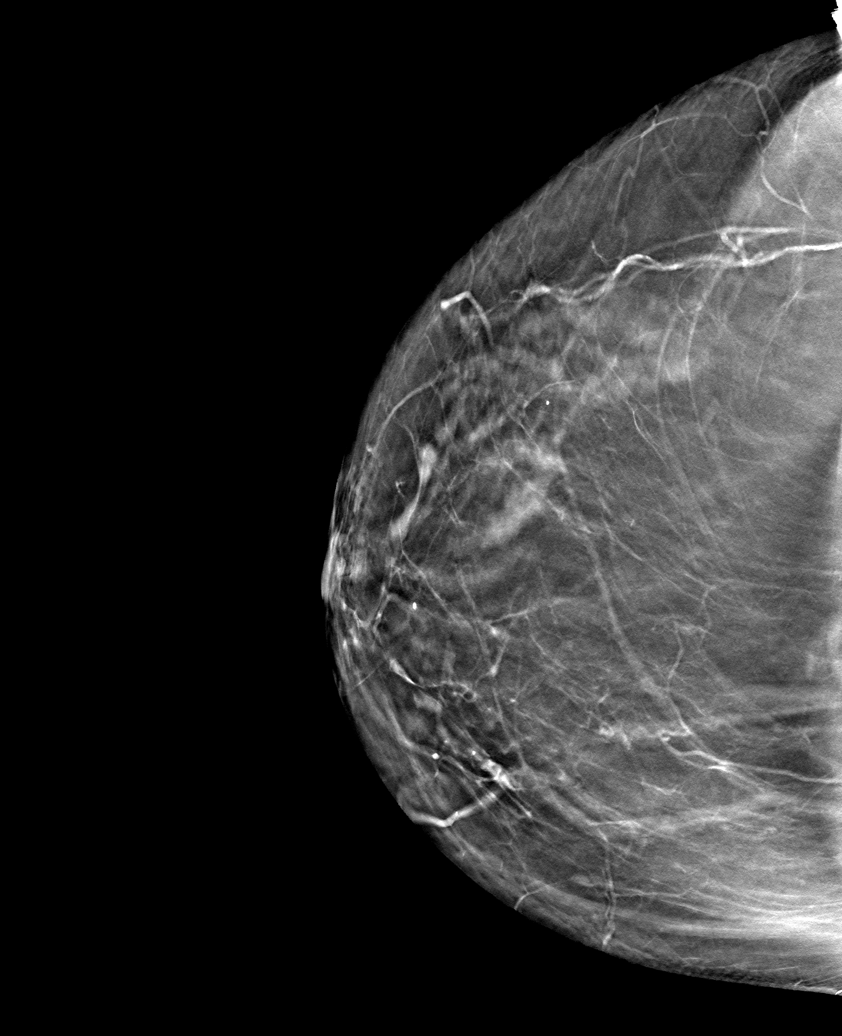

[6 of 30 positions shown; findings below may reference images not displayed]

ACR Breast Density Category b: There are scattered areas of
fibroglandular density.
FINDINGS: There are no findings suspicious for malignancy.
IMPRESSION: No mammographic evidence of malignancy. A result letter of this
screening mammogram will be mailed directly to the patient.

RECOMMENDATION:
Screening mammogram in one year. (Code:51-O-LD2)

BI-RADS CATEGORY  1: Negative.

## 2023-04-03 DIAGNOSIS — E875 Hyperkalemia: Secondary | ICD-10-CM | POA: Diagnosis not present

## 2023-04-04 LAB — BASIC METABOLIC PANEL WITH GFR
BUN/Creatinine Ratio: 28 (ref 12–28)
BUN: 26 mg/dL (ref 8–27)
CO2: 21 mmol/L (ref 20–29)
Calcium: 9.6 mg/dL (ref 8.7–10.3)
Chloride: 104 mmol/L (ref 96–106)
Creatinine, Ser: 0.94 mg/dL (ref 0.57–1.00)
Glucose: 147 mg/dL — ABNORMAL HIGH (ref 70–99)
Potassium: 4.4 mmol/L (ref 3.5–5.2)
Sodium: 141 mmol/L (ref 134–144)
eGFR: 63 mL/min/1.73

## 2023-04-22 ENCOUNTER — Ambulatory Visit: Payer: Medicare PPO | Attending: Cardiology | Admitting: Cardiology

## 2023-04-22 ENCOUNTER — Encounter: Payer: Self-pay | Admitting: Cardiology

## 2023-04-22 VITALS — BP 112/60 | HR 80 | Ht 60.0 in | Wt 211.6 lb

## 2023-04-22 DIAGNOSIS — I350 Nonrheumatic aortic (valve) stenosis: Secondary | ICD-10-CM

## 2023-04-22 DIAGNOSIS — R0609 Other forms of dyspnea: Secondary | ICD-10-CM | POA: Diagnosis not present

## 2023-04-22 DIAGNOSIS — E1165 Type 2 diabetes mellitus with hyperglycemia: Secondary | ICD-10-CM | POA: Diagnosis not present

## 2023-04-22 DIAGNOSIS — E785 Hyperlipidemia, unspecified: Secondary | ICD-10-CM | POA: Diagnosis not present

## 2023-04-22 DIAGNOSIS — I10 Essential (primary) hypertension: Secondary | ICD-10-CM | POA: Diagnosis not present

## 2023-04-22 DIAGNOSIS — I340 Nonrheumatic mitral (valve) insufficiency: Secondary | ICD-10-CM | POA: Diagnosis not present

## 2023-04-22 NOTE — Patient Instructions (Addendum)

## 2023-04-22 NOTE — Progress Notes (Signed)
 Cardiology Office Note:    Date:  04/22/2023   ID:  Gloria Sharp, DOB [birth year], MRN [id number]  PCP:  Trinidad Hun, MD  Cardiologist:  Lamar Fitch, MD    Referring MD: Trinidad Hun, MD   Chief Complaint  Patient presents with   Shortness of Breath    History of Present Illness:    Gloria Sharp is a 78 y.o. female past medical history significant for cardiomyopathy ejection fraction 25% with appropriate guideline directed medical therapy improvement to normal, also aortic stenosis which appears to be moderate, history of coronary disease but no critical cardiac catheterization done summer last year show up to 60% blockage in the LAD, distal RCA did have 80% stenosis, she comes today to my office shortness of breath is still there but no chest pain tightness squeezing pressure burning chest.  Past Medical History:  Diagnosis Date   AKI (acute kidney injury) (HCC) 03/27/2021   Angina pectoris (HCC) 05/15/2022   Aortic stenosis 09/04/2019   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 sciatica 02/12/2022   Class 3 obesity 03/26/2021   CVA (cerebral vascular accident) [facility]) 2015?   Cystocele with uterine prolapse 01/17/2018   Diabetes (HCC)    Dizziness 03/27/2021   Dyslipidemia 10/10/2018   Essential hypertension 10/10/2018   Fibromyalgia    GERD (gastroesophageal reflux disease)    Heart murmur 10/10/2018   History of CVA (cerebrovascular accident) 10/10/2018   Hydronephrosis of right kidney 03/26/2021   Hypercholesterolemia    Hypertension    Hypokalemia 03/27/2021   Leukocytosis    Mitral regurgitation 11/24/2018   Nonsustained ventricular tachycardia (HCC) 11/24/2018   Obesity    Post-operative nausea and vomiting    hard to wake up   Rectocele 01/17/2018   Sacroiliitis (HCC) 02/12/2022   Sepsis secondary to UTI (HCC) 03/26/2021   Type 2 diabetes mellitus with hyperglycemia (HCC) 03/26/2021   Type 2 diabetes mellitus without complication, without long-term  current use of insulin  (HCC) 10/10/2018    Past Surgical History:  Procedure Laterality Date   ABDOMINAL HYSTERECTOMY     partial, left the ovaries   APPENDECTOMY     BREAST CYST ASPIRATION Right    COLONOSCOPY  11/11/2013   Colonioc polyps status post polypectomy. Pancolonic diverticulosis predominately in the sigmoid colon   CYSTOSCOPY W/ URETERAL STENT PLACEMENT Right 03/26/2021   Procedure: CYSTOSCOPY WITH RETROGRADE PYELOGRAM/URETERAL STENT PLACEMENT;  Surgeon: Selma Donnice JONELLE, MD;  Location: [facility];  Service: Urology;  Laterality: Right;   CYSTOSCOPY/URETEROSCOPY/HOLMIUM LASER/STENT PLACEMENT Right 04/17/2021   Procedure: CYSTOSCOPY/ RETROGRADE/URETEROSCOPY/HOLMIUM LASER/STENT PLACEMENT;  Surgeon: Selma Donnice JONELLE, MD;  Location: [facility];  Service: Urology;  Laterality: Right;  ONLY NEEDS 60 MIN   DILATION AND CURETTAGE OF UTERUS     GALLBLADDER SURGERY Right 07/2014   HERNIA REPAIR  02/20/2021   Umbilical   LEFT HEART CATH AND CORONARY ANGIOGRAPHY N/A 05/16/2022   Procedure: LEFT HEART CATH AND CORONARY ANGIOGRAPHY;  Surgeon: Darron Deatrice LABOR, MD;  Location: [facility];  Service: Cardiovascular;  Laterality: N/A;   RIGHT/LEFT HEART CATH AND CORONARY ANGIOGRAPHY N/A 08/30/2022   Procedure: RIGHT/LEFT HEART CATH AND CORONARY ANGIOGRAPHY;  Surgeon: Wendel Lurena POUR, MD;  Location: [facility];  Service: Cardiovascular;  Laterality: N/A;   TEE WITHOUT CARDIOVERSION N/A 09/03/2022   Procedure: TRANSESOPHAGEAL ECHOCARDIOGRAM;  Surgeon: Gardenia Led, DO;  Location: [facility];  Service: Cardiovascular;  Laterality: N/A;   vaginal polyp removal      Current  Medications: Current Meds  Medication Sig   acetaminophen  (TYLENOL ) 650 MG CR tablet Take 650 mg by mouth daily as needed for pain.   alendronate (FOSAMAX) 35 MG tablet Take 35 mg by mouth every 7 (seven) days.   atorvastatin  (LIPITOR ) 80 MG tablet Take 1 tablet (80 mg total) by mouth daily.   brimonidine   (ALPHAGAN ) 0.15 % ophthalmic solution Place 1 drop into both eyes 2 (two) times daily.   cholestyramine  (QUESTRAN ) 4 g packet Take 1 packet (4 g total) by mouth daily. Take at least 2 hours before or after rest of the medications   clopidogrel  (PLAVIX ) 75 MG tablet Take 1 tablet (75 mg total) by mouth daily.   Cyanocobalamin  (VITAMIN B-12 PO) Take 1 tablet by mouth daily in the afternoon.   diclofenac  Sodium (VOLTAREN ) 1 % GEL Apply 2 g topically at bedtime as needed (knee pain).   fenofibrate  (TRICOR ) 145 MG tablet Take 145 mg by mouth daily.   ferrous sulfate  325 (65 FE) MG EC tablet Take 325 mg by mouth daily in the afternoon.   gabapentin  (NEURONTIN ) 300 MG capsule Take 300 mg by mouth 3 (three) times daily.   Infant Care Products [facility]) OINT Apply 1 application  topically 2 (two) times daily as needed (itching).   MAGNESIUM  PO Take 1 tablet by mouth daily in the afternoon.   metFORMIN (GLUCOPHAGE) 1000 MG tablet Take 1,000 mg by mouth 2 (two) times daily with a meal.   metoprolol  succinate (TOPROL  XL) 25 MG 24 hr tablet Take 0.5 tablets (12.5 mg total) by mouth daily.   Omega-3 Fatty Acids (FISH OIL PO) Take 1 capsule by mouth daily in the afternoon.   OVER THE COUNTER MEDICATION Apply 1 patch topically daily as needed (back pain). Coralite pain patch   pantoprazole  (PROTONIX ) 40 MG tablet Take 40 mg by mouth daily.   sacubitril -valsartan  (ENTRESTO ) 24-26 MG Take 1 tablet by mouth 2 (two) times daily.   sertraline  (ZOLOFT ) 50 MG tablet Take 50 mg by mouth daily.   tiZANidine  (ZANAFLEX ) 2 MG tablet Take 2 mg by mouth every 6 (six) hours as needed for muscle spasms.   TURMERIC PO Take 1 tablet by mouth daily in the afternoon.   [DISCONTINUED] spironolactone  (ALDACTONE ) 25 MG tablet Take 1 tablet (25 mg total) by mouth daily.     Allergies:   Celebrex [celecoxib], Keflex  [cephalexin ], Relafen [nabumetone], Cipro [ciprofloxacin hcl], and Flagyl [metronidazole]   Social History    Socioeconomic History   Marital status: Married    Spouse name: Not on file   Number of children: Not on file   Years of education: Not on file   Highest education level: Not on file  Occupational History   Not on file  Tobacco Use   Smoking status: Never   Smokeless tobacco: Never  Vaping Use   Vaping status: Never Used  Substance and Sexual Activity   Alcohol  use: Not Currently   Drug use: Never   Sexual activity: Not on file  Other Topics Concern   Not on file  Social History Narrative   Not on file   Social Drivers of Health   Financial Resource Strain: Not on file  Food Insecurity: No Food Insecurity (05/15/2022)   Hunger Vital Sign    Worried About Running Out of Food in the Last Year: Never true    Ran Out of Food in the Last Year: Never true  Transportation Needs: No Transportation Needs (05/15/2022)   PRAPARE - Transportation  Lack of Transportation (Medical): No    Lack of Transportation (Non-Medical): No  Physical Activity: Not on file  Stress: Not on file  Social Connections: Not on file     Family History: The patient's family history includes Cancer in her mother; Diabetes in her mother; Heart disease in her father; Heart failure in her mother; Hypertension in her father and mother; Stroke in her mother. There is no history of Colon cancer, Esophageal cancer, Stomach cancer, Rectal cancer, or Colon polyps. ROS:   Please see the history of present illness.    All 14 point review of systems negative except as described per history of present illness  EKGs/Labs/Other Studies Reviewed:    EKG Interpretation Date/Time:  Friday February 22 2023 15:48:12 EST Ventricular Rate:  88 PR Interval:  134 QRS Duration:  128 QT Interval:  382 QTC Calculation: 462 R Axis:   42  Text Interpretation: Normal sinus rhythm Non-specific intra-ventricular conduction block Abnormal ECG When compared with ECG of 11-Sep-2022 15:24, Premature ventricular complexes are no  longer Present QRS axis Shifted left T wave inversion no longer evident in Inferior leads Confirmed by Bernie Charleston [phone]) on 02/22/2023 4:01:40 PM    Recent Labs: 05/15/2022: TSH 1.085 09/05/2022: ALT 30; Hemoglobin 9.4; Magnesium  1.6; Platelets 357 12/06/2022: B Natriuretic Peptide 84.3 02/22/2023: NT-Pro BNP 410 04/03/2023: BUN 26; Creatinine, Ser 0.94; Potassium 4.4; Sodium 141  Recent Lipid Panel    Component Value Date/Time   CHOL 127 05/16/2022 0059   TRIG 189 (H) 09/03/2022 0428   HDL 38 (L) 05/16/2022 0059   CHOLHDL 3.3 05/16/2022 0059   VLDL 36 05/16/2022 0059   LDLCALC 53 05/16/2022 0059    Physical Exam:    VS:  BP 124/86 (BP Location: Right Arm, Patient Position: Sitting)   Pulse 89   Ht 5' (1.524 m)   Wt 212 lb 9.6 oz (96.4 kg)   SpO2 96%   BMI 41.52 kg/m     Wt Readings from Last 3 Encounters:  04/22/23 211 lb 9.6 oz (96 kg)  02/22/23 212 lb 9.6 oz (96.4 kg)  12/18/22 210 lb (95.3 kg)     GEN:  Well nourished, well developed in no acute distress HEENT: Normal NECK: No JVD; No carotid bruits LYMPHATICS: No lymphadenopathy CARDIAC: RRR, systolic ejection murmur grade 4/6 to 5/6 right upper portion of the sternum with radiation towards the neck S2 is still present, no rubs, no gallops RESPIRATORY:  Clear to auscultation without rales, wheezing or rhonchi  ABDOMEN: Soft, non-tender, non-distended MUSCULOSKELETAL:  No edema; No deformity  SKIN: Warm and dry LOWER EXTREMITIES: no swelling NEUROLOGIC:  Alert and oriented x 3 PSYCHIATRIC:  Normal affect   ASSESSMENT:    1. Dyspnea on exertion   2. Essential hypertension   3. Nonrheumatic aortic valve stenosis   4. Nonrheumatic mitral valve regurgitation    PLAN:    In order of problems listed above:  Dyspnea on exertion asked her to have repeat echocardiogram done to look at the degree of aortic stenosis.  I warned her about signs and symptoms of critical arctic stenosis screening asked her to let me  know if she develops close. Essential hypertension seems to be well-controlled. Aortic stenosis plan as described above   Medication Adjustments/Labs and Tests Ordered: Current medicines are reviewed at length with the patient today.  Concerns regarding medicines are outlined above.  Orders Placed This Encounter  Procedures   Basic metabolic panel   Pro b natriuretic peptide (BNP)  EKG 12-Lead   ECHOCARDIOGRAM COMPLETE   Medication changes: No orders of the defined types were placed in this encounter.   Signed, Lamar DOROTHA Fitch, MD, [facility] 04/22/2023 9:46 AM    [facility]

## 2023-04-22 NOTE — Addendum Note (Signed)
 Addended by: Baldo Ash D on: 04/22/2023 09:48 AM   Modules accepted: Orders

## 2023-04-22 NOTE — Progress Notes (Signed)
 Cardiology Office Note:    Date:  04/22/2023   ID:  Gloria Sharp, DOB March 14, 1945, MRN [id number]  PCP:  Gloria Greenspan, MD  Cardiologist:  Gloria Balsam, MD    Referring MD: Gloria Greenspan, MD   Chief Complaint  Patient presents with   Follow-up    History of Present Illness:    Gloria Sharp is a 78 y.o. female past medical history significant for coronary artery disease, she did have cardiac catheterization done in the summer of last year which showed 65% stenosis of LAD 65% stenosis of circumflex artery, 80% stenosis distal RCA, none of this felt that it was enough to justify her cardiomyopathy that she developed at the time, her ejection fraction was only 25%.  She comes today 2 months for follow-up overall she says she is doing fine she still gets her shortness of breath while walking.  Last echocardiogram done in February 2015 show ejection fraction 60 to 65%, moderate aortic stenosis, noncritical.  Past Medical History:  Diagnosis Date   AKI (acute kidney injury) (HCC) 03/27/2021   Angina pectoris (HCC) 05/15/2022   Aortic stenosis 09/04/2019   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 sciatica 02/12/2022   Class 3 obesity 03/26/2021   CVA (cerebral vascular accident) [facility]) 2015?   Cystocele with uterine prolapse 01/17/2018   Diabetes (HCC)    Dizziness 03/27/2021   Dyslipidemia 10/10/2018   Essential hypertension 10/10/2018   Fibromyalgia    GERD (gastroesophageal reflux disease)    Heart murmur 10/10/2018   History of CVA (cerebrovascular accident) 10/10/2018   Hydronephrosis of right kidney 03/26/2021   Hypercholesterolemia    Hypertension    Hypokalemia 03/27/2021   Leukocytosis    Mitral regurgitation 11/24/2018   Nonsustained ventricular tachycardia (HCC) 11/24/2018   Obesity    Post-operative nausea and vomiting    "hard to wake up"   Rectocele 01/17/2018   Sacroiliitis (HCC) 02/12/2022   Sepsis secondary to UTI (HCC) 03/26/2021   Type 2 diabetes mellitus  with hyperglycemia (HCC) 03/26/2021   Type 2 diabetes mellitus without complication, without long-term current use of insulin (HCC) 10/10/2018    Past Surgical History:  Procedure Laterality Date   ABDOMINAL HYSTERECTOMY     partial, left the ovaries   APPENDECTOMY     BREAST CYST ASPIRATION Right    COLONOSCOPY  11/11/2013   Colonioc polyps status post polypectomy. Pancolonic diverticulosis predominately in the sigmoid colon   CYSTOSCOPY W/ URETERAL STENT PLACEMENT Right 03/26/2021   Procedure: CYSTOSCOPY WITH RETROGRADE PYELOGRAM/URETERAL STENT PLACEMENT;  Surgeon: Gloria Hick, MD;  Location: [facility];  Service: Urology;  Laterality: Right;   CYSTOSCOPY/URETEROSCOPY/HOLMIUM LASER/STENT PLACEMENT Right 04/17/2021   Procedure: CYSTOSCOPY/ RETROGRADE/URETEROSCOPY/HOLMIUM LASER/STENT PLACEMENT;  Surgeon: Gloria Hick, MD;  Location: [facility];  Service: Urology;  Laterality: Right;  ONLY NEEDS 60 MIN   DILATION AND CURETTAGE OF UTERUS     GALLBLADDER SURGERY Right 07/2014   HERNIA REPAIR  02/20/2021   Umbilical   LEFT HEART CATH AND CORONARY ANGIOGRAPHY N/A 05/16/2022   Procedure: LEFT HEART CATH AND CORONARY ANGIOGRAPHY;  Surgeon: Gloria Ouch, MD;  Location: [facility];  Service: Cardiovascular;  Laterality: N/A;   RIGHT/LEFT HEART CATH AND CORONARY ANGIOGRAPHY N/A 08/30/2022   Procedure: RIGHT/LEFT HEART CATH AND CORONARY ANGIOGRAPHY;  Surgeon: Gloria Pyo, MD;  Location: [facility];  Service: Cardiovascular;  Laterality: N/A;   TEE WITHOUT CARDIOVERSION N/A 09/03/2022   Procedure: TRANSESOPHAGEAL ECHOCARDIOGRAM;  Surgeon: Gloria Nettles,  DO;  Location: [facility];  Service: Cardiovascular;  Laterality: N/A;   vaginal polyp removal      Current Medications: Current Meds  Medication Sig   acetaminophen (TYLENOL) 650 MG CR tablet Take 650 mg by mouth daily as needed for pain.   alendronate (FOSAMAX) 35 MG tablet Take 35 mg by mouth every 7 (seven)  days.   atorvastatin (LIPITOR) 80 MG tablet Take 1 tablet (80 mg total) by mouth daily.   brimonidine (ALPHAGAN) 0.15 % ophthalmic solution Place 1 drop into both eyes 2 (two) times daily.   cholestyramine (QUESTRAN) 4 g packet Take 1 packet (4 g total) by mouth daily. Take at least 2 hours before or after rest of the medications   clopidogrel (PLAVIX) 75 MG tablet Take 1 tablet (75 mg total) by mouth daily.   Cyanocobalamin (VITAMIN B-12 PO) Take 1 tablet by mouth daily in the afternoon.   diclofenac Sodium (VOLTAREN) 1 % GEL Apply 2 g topically at bedtime as needed (knee pain).   fenofibrate (TRICOR) 145 MG tablet Take 145 mg by mouth daily.   ferrous sulfate 325 (65 FE) MG EC tablet Take 325 mg by mouth daily in the afternoon.   gabapentin (NEURONTIN) 300 MG capsule Take 300 mg by mouth 3 (three) times daily.   Infant Care Products [facility]) OINT Apply 1 application  topically 2 (two) times daily as needed (itching).   MAGNESIUM PO Take 1 tablet by mouth daily in the afternoon.   metFORMIN (GLUCOPHAGE) 1000 MG tablet Take 1,000 mg by mouth 2 (two) times daily with a meal.   metoprolol succinate (TOPROL XL) 25 MG 24 hr tablet Take 0.5 tablets (12.5 mg total) by mouth daily.   Omega-3 Fatty Acids (FISH OIL PO) Take 1 capsule by mouth daily in the afternoon.   OVER THE COUNTER MEDICATION Apply 1 patch topically daily as needed (back pain). Coralite pain patch   pantoprazole (PROTONIX) 40 MG tablet Take 40 mg by mouth daily.   sacubitril-valsartan (ENTRESTO) 24-26 MG Take 1 tablet by mouth 2 (two) times daily.   sertraline (ZOLOFT) 50 MG tablet Take 50 mg by mouth daily.   spironolactone (ALDACTONE) 25 MG tablet Take 0.5 tablets (12.5 mg total) by mouth daily.   tiZANidine (ZANAFLEX) 2 MG tablet Take 2 mg by mouth every 6 (six) hours as needed for muscle spasms.   TURMERIC PO Take 1 tablet by mouth daily in the afternoon.     Allergies:   Celebrex [celecoxib], Keflex [cephalexin], Relafen  [nabumetone], Cipro [ciprofloxacin hcl], and Flagyl [metronidazole]   Social History   Socioeconomic History   Marital status: Married    Spouse name: Not on file   Number of children: Not on file   Years of education: Not on file   Highest education level: Not on file  Occupational History   Not on file  Tobacco Use   Smoking status: Never   Smokeless tobacco: Never  Vaping Use   Vaping status: Never Used  Substance and Sexual Activity   Alcohol use: Not Currently   Drug use: Never   Sexual activity: Not on file  Other Topics Concern   Not on file  Social History Narrative   Not on file   Social Drivers of Health   Financial Resource Strain: Not on file  Food Insecurity: No Food Insecurity (05/15/2022)   Hunger Vital Sign    Worried About Running Out of Food in the Last Year: Never true  Ran Out of Food in the Last Year: Never true  Transportation Needs: No Transportation Needs (05/15/2022)   PRAPARE - Administrator, Civil Service (Medical): No    Lack of Transportation (Non-Medical): No  Physical Activity: Not on file  Stress: Not on file  Social Connections: Not on file     Family History: The patient's family history includes Cancer in her mother; Diabetes in her mother; Heart disease in her father; Heart failure in her mother; Hypertension in her father and mother; Stroke in her mother. There is no history of Colon cancer, Esophageal cancer, Stomach cancer, Rectal cancer, or Colon polyps. ROS:   Please see the history of present illness.    All 14 point review of systems negative except as described per history of present illness  EKGs/Labs/Other Studies Reviewed:         Recent Labs: 05/15/2022: TSH 1.085 09/05/2022: ALT 30; Hemoglobin 9.4; Magnesium 1.6; Platelets 357 12/06/2022: B Natriuretic Peptide 84.3 02/22/2023: NT-Pro BNP 410 04/03/2023: BUN 26; Creatinine, Ser 0.94; Potassium 4.4; Sodium 141  Recent Lipid Panel    Component Value  Date/Time   CHOL 127 05/16/2022 0059   TRIG 189 (H) 09/03/2022 0428   HDL 38 (L) 05/16/2022 0059   CHOLHDL 3.3 05/16/2022 0059   VLDL 36 05/16/2022 0059   LDLCALC 53 05/16/2022 0059    Physical Exam:    VS:  BP 112/60 (BP Location: Right Arm, Patient Position: Sitting)   Pulse 80   Ht 5' (1.524 m)   Wt 211 lb 9.6 oz (96 kg)   SpO2 94%   BMI 41.33 kg/m     Wt Readings from Last 3 Encounters:  04/22/23 211 lb 9.6 oz (96 kg)  02/22/23 212 lb 9.6 oz (96.4 kg)  12/18/22 210 lb (95.3 kg)     GEN:  Well nourished, well developed in no acute distress HEENT: Normal NECK: No JVD; No carotid bruits LYMPHATICS: No lymphadenopathy CARDIAC: RRR, systolic ejection murmur grade 3/6 to 4/6 best heard right upper portion of the sternum, S3 still present no rubs, no gallops RESPIRATORY:  Clear to auscultation without rales, wheezing or rhonchi  ABDOMEN: Soft, non-tender, non-distended MUSCULOSKELETAL:  No edema; No deformity  SKIN: Warm and dry LOWER EXTREMITIES: no swelling NEUROLOGIC:  Alert and oriented x 3 PSYCHIATRIC:  Normal affect   ASSESSMENT:    1. Nonrheumatic aortic valve stenosis   2. Essential hypertension   3. Nonrheumatic mitral valve regurgitation   4. Type 2 diabetes mellitus with hyperglycemia, unspecified whether long term insulin use (HCC)   5. Dyslipidemia    PLAN:    In order of problems listed above:  Nonrheumatic arctic valve stenosis which is moderate.  However since her symptomatology I will repeat her echocardiogram in July.  I warned her about signs and symptoms of critical arctic stenosis which is more shortness of breath, dizziness, passing out, chest pain.  She will let me know if she develop of those. Essential hypertension, blood pressure well-controlled continue present management. Dyslipidemia I did review K PN which show LDL 29 HDL of 36.  Will continue present management. Type 2 diabetes followed by antimedicine team   Medication  Adjustments/Labs and Tests Ordered: Current medicines are reviewed at length with the patient today.  Concerns regarding medicines are outlined above.  No orders of the defined types were placed in this encounter.  Medication changes: No orders of the defined types were placed in this encounter.   Signed, Marveen Reeks.  Bing Matter, MD, [facility] 04/22/2023 9:42 AM    [facility]

## 2023-04-27 ENCOUNTER — Emergency Department (HOSPITAL_COMMUNITY)
Admission: EM | Admit: 2023-04-27 | Discharge: 2023-04-28 | Disposition: A | Discharge status: Home / Self Care | Attending: Emergency Medicine | Admitting: Emergency Medicine

## 2023-04-27 ENCOUNTER — Other Ambulatory Visit: Payer: Self-pay

## 2023-04-27 DIAGNOSIS — R1032 Left lower quadrant pain: Secondary | ICD-10-CM | POA: Insufficient documentation

## 2023-04-27 DIAGNOSIS — D72829 Elevated white blood cell count, unspecified: Secondary | ICD-10-CM | POA: Diagnosis not present

## 2023-04-27 DIAGNOSIS — K5730 Diverticulosis of large intestine without perforation or abscess without bleeding: Secondary | ICD-10-CM | POA: Diagnosis not present

## 2023-04-27 DIAGNOSIS — Z9049 Acquired absence of other specified parts of digestive tract: Secondary | ICD-10-CM | POA: Diagnosis not present

## 2023-04-27 DIAGNOSIS — I10 Essential (primary) hypertension: Secondary | ICD-10-CM | POA: Diagnosis not present

## 2023-04-27 DIAGNOSIS — E119 Type 2 diabetes mellitus without complications: Secondary | ICD-10-CM | POA: Diagnosis not present

## 2023-04-27 DIAGNOSIS — R109 Unspecified abdominal pain: Secondary | ICD-10-CM | POA: Diagnosis not present

## 2023-04-27 DIAGNOSIS — K760 Fatty (change of) liver, not elsewhere classified: Secondary | ICD-10-CM | POA: Diagnosis not present

## 2023-04-27 LAB — URINALYSIS, ROUTINE W REFLEX MICROSCOPIC
Bilirubin Urine: NEGATIVE
Glucose, UA: NEGATIVE mg/dL
Hgb urine dipstick: NEGATIVE
Ketones, ur: NEGATIVE mg/dL
Nitrite: NEGATIVE
Protein, ur: NEGATIVE mg/dL
Specific Gravity, Urine: 1.013 (ref 1.005–1.030)
WBC, UA: >50 WBC/hpf (ref 0–5)
pH: 5 (ref 5.0–8.0)

## 2023-04-27 LAB — CBC
HCT: 33.7 % — ABNORMAL LOW (ref 36.0–46.0)
Hemoglobin: 10.6 g/dL — ABNORMAL LOW (ref 12.0–15.0)
MCH: 29.5 pg (ref 26.0–34.0)
MCHC: 31.5 g/dL (ref 30.0–36.0)
MCV: 93.9 fL (ref 80.0–100.0)
Platelets: 324 10*3/uL (ref 150–400)
RBC: 3.59 MIL/uL — ABNORMAL LOW (ref 3.87–5.11)
RDW: 12.9 % (ref 11.5–15.5)
WBC: 11.5 10*3/uL — ABNORMAL HIGH (ref 4.0–10.5)
nRBC: 0 % (ref 0.0–0.2)

## 2023-04-27 MED ORDER — MORPHINE SULFATE (PF) 2 MG/ML IV SOLN
2.0000 mg | Freq: Once | INTRAVENOUS | Status: AC
  Administered 2023-04-27: 2 mg via INTRAVENOUS
  Filled 2023-04-27: qty 1

## 2023-04-27 MED ORDER — ONDANSETRON HCL 4 MG/2ML IJ SOLN
4.0000 mg | Freq: Once | INTRAMUSCULAR | Status: AC
  Administered 2023-04-27: 4 mg via INTRAVENOUS
  Filled 2023-04-27: qty 2

## 2023-04-27 MED ORDER — SODIUM CHLORIDE 0.9 % IV BOLUS
500.0000 mL | Freq: Once | INTRAVENOUS | Status: AC
  Administered 2023-04-27: 500 mL via INTRAVENOUS

## 2023-04-27 NOTE — ED Provider Notes (Signed)
 Emergency Department Provider Note   I have reviewed the triage vital signs and the nursing notes.   HISTORY  Chief Complaint Flank Pain (Left)   HPI Gloria Sharp is a 78 y.o. female with past history reviewed below including prior infected ureteral stone presents to the emergency department with left flank pain.  Pain is intermittent but severe.  She has some associated chronic back pain but states is pain that she is experiencing today feels more like her kidney stone discomfort.  Denies any fevers or shaking chills.  She states that because of her experience last time she decided to present to the ED earlier for evaluation. No dysuria.    Past Medical History:  Diagnosis Date   AKI (acute kidney injury) (HCC) 03/27/2021   Angina pectoris (HCC) 05/15/2022   Aortic stenosis 09/04/2019   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 sciatica 02/12/2022   Class 3 obesity 03/26/2021   CVA (cerebral vascular accident) [facility]) 2015?   Cystocele with uterine prolapse 01/17/2018   Diabetes (HCC)    Dizziness 03/27/2021   Dyslipidemia 10/10/2018   Essential hypertension 10/10/2018   Fibromyalgia    GERD (gastroesophageal reflux disease)    Heart murmur 10/10/2018   History of CVA (cerebrovascular accident) 10/10/2018   Hydronephrosis of right kidney 03/26/2021   Hypercholesterolemia    Hypertension    Hypokalemia 03/27/2021   Leukocytosis    Mitral regurgitation 11/24/2018   Nonsustained ventricular tachycardia (HCC) 11/24/2018   Obesity    Post-operative nausea and vomiting    "hard to wake up"   Rectocele 01/17/2018   Sacroiliitis (HCC) 02/12/2022   Sepsis secondary to UTI (HCC) 03/26/2021   Type 2 diabetes mellitus with hyperglycemia (HCC) 03/26/2021   Type 2 diabetes mellitus without complication, without Nakkia Mackiewicz-term current use of insulin (HCC) 10/10/2018    Review of Systems  Constitutional: No fever/chills Cardiovascular: Denies chest pain. Respiratory: Denies  shortness of breath. Gastrointestinal: Positive left flank/abdominal pain. Mild nausea, no vomiting.  No diarrhea.  No constipation. Skin: Negative for rash. Neurological: Negative for headaches.  ____________________________________________   PHYSICAL EXAM:  VITAL SIGNS: ED Triage Vitals  Encounter Vitals Group     BP 04/27/23 2233 (!) 140/61     Pulse Rate 04/27/23 2233 81     Resp 04/27/23 2233 17     Temp 04/27/23 2233 98 F (36.7 C)     Temp Source 04/27/23 2233 Oral     SpO2 04/27/23 2233 97 %   Constitutional: Alert and oriented. Well appearing and in no acute distress. Eyes: Conjunctivae are normal. Head: Atraumatic. Nose: No congestion/rhinnorhea. Mouth/Throat: Mucous membranes are moist.   Neck: No stridor.   Cardiovascular: Normal rate, regular rhythm. Good peripheral circulation. Grossly normal heart sounds.   Respiratory: Normal respiratory effort.  No retractions. Lungs CTAB. Gastrointestinal: Soft with mild tenderness in the left abdomen. No peritonitis. No distention.  Musculoskeletal: No gross deformities of extremities. Neurologic:  Normal speech and language.  Skin:  Skin is warm, dry and intact. No rash noted.  ____________________________________________   LABS (all labs ordered are listed, but only abnormal results are displayed)  Labs Reviewed  URINALYSIS, ROUTINE W REFLEX MICROSCOPIC - Abnormal; Notable for the following components:      Result Value   Color, Urine STRAW (*)    APPearance HAZY (*)    Leukocytes,Ua MODERATE (*)    Bacteria, UA RARE (*)    All other components within normal limits  CBC - Abnormal; Notable for  the following components:   WBC 11.5 (*)    RBC 3.59 (*)    Hemoglobin 10.6 (*)    HCT 33.7 (*)    All other components within normal limits  BASIC METABOLIC PANEL WITH GFR - Abnormal; Notable for the following components:   Chloride 112 (*)    CO2 21 (*)    Glucose, Bld 185 (*)    BUN 37 (*)    Creatinine, Ser 1.11  (*)    GFR, Estimated 52 (*)    All other components within normal limits  HEPATIC FUNCTION PANEL - Abnormal; Notable for the following components:   Total Protein 6.3 (*)    All other components within normal limits  URINE CULTURE  LIPASE, BLOOD   ____________________________________________  RADIOLOGY  CT ABDOMEN PELVIS W CONTRAST Result Date: 04/28/2023 CLINICAL DATA:  Acute abdominal pain EXAM: CT ABDOMEN AND PELVIS WITH CONTRAST TECHNIQUE: Multidetector CT imaging of the abdomen and pelvis was performed using the standard protocol following bolus administration of intravenous contrast. RADIATION DOSE REDUCTION: This exam was performed according to the departmental dose-optimization program which includes automated exposure control, adjustment of the mA and/or kV according to patient size and/or use of iterative reconstruction technique. CONTRAST:  OMNIPAQUE IOHEXOL 300 MG/ML  SOLN COMPARISON:  08/30/2022 FINDINGS: Lower chest: No acute abnormality. Hepatobiliary: Gallbladder has been surgically removed. Mild fatty infiltration of the liver is noted. Pancreas: Unremarkable. No pancreatic ductal dilatation or surrounding inflammatory changes. Spleen: Normal in size without focal abnormality. Adrenals/Urinary Tract: Adrenal glands are within normal limits. Kidneys demonstrate a normal enhancement bilaterally. No renal calculi or obstructive changes are noted. Delayed images demonstrate normal excretion of contrast. Bladder is partially distended. Stomach/Bowel: Scattered diverticular change of the colon is noted without evidence of diverticulitis. The appendix has been surgically removed. Small bowel and stomach are within normal limits. Vascular/Lymphatic: Aortic atherosclerosis. No enlarged abdominal or pelvic lymph nodes. Reproductive: Status post hysterectomy. No adnexal masses. Other: No abdominal wall hernia or abnormality. No abdominopelvic ascites. Musculoskeletal: Degenerative changes  of lumbar spine are noted. IMPRESSION: Diverticulosis without diverticulitis. No other focal abnormality is noted. Electronically Signed   By: Violeta Grey M.D.   On: 04/28/2023 01:32    ____________________________________________   PROCEDURES  Procedure(s) performed:   Procedures  None  ____________________________________________   INITIAL IMPRESSION / ASSESSMENT AND PLAN / ED COURSE  Pertinent labs & imaging results that were available during my care of the patient were reviewed by me and considered in my medical decision making (see chart for details).   This patient is Presenting for Evaluation of flank pain, which does require a range of treatment options, and is a complaint that involves a high risk of morbidity and mortality.  The Differential Diagnoses includes but is not exclusive to acute cholecystitis, intrathoracic causes for epigastric abdominal pain, gastritis, duodenitis, pancreatitis, small bowel or large bowel obstruction, abdominal aortic aneurysm, hernia, gastritis, etc.   Critical Interventions-    Medications  sodium chloride 0.9 % bolus 500 mL (0 mLs Intravenous Stopped 04/28/23 0057)  morphine (PF) 2 MG/ML injection 2 mg (2 mg Intravenous Given 04/27/23 2325)  ondansetron (ZOFRAN) injection 4 mg (4 mg Intravenous Given 04/27/23 2326)  iohexol (OMNIPAQUE) 300 MG/ML solution 100 mL (100 mLs Intravenous Contrast Given 04/28/23 0110)  fosfomycin (MONUROL) packet 3 g (3 g Oral Given 04/28/23 0229)    Reassessment after intervention:  pain improved.   Clinical Laboratory Tests Ordered, included CBC with leukocytosis. UA equivocal infection.  No acute knee injury.  LFTs, bilirubin, lipase normal. Urine culture sent.   Radiologic Tests Ordered, included CT abdomen/pelvis. I independently interpreted the images and agree with radiology interpretation.   Cardiac Monitor Tracing which shows NSR.    Social Determinants of Health Risk patient is a non-smoker.    Medical Decision Making: Summary:  The patient presents emergency department left flank and abdominal pain.  Mild tenderness on exam.  Differential is broad and includes vascular etiologies for pain but patient has prior history of ureteral stone which feels similar. Plan for CT and reassess.   Reevaluation with update and discussion with patient. Plan for single dose fosfomycin here with equivocal UTI.  No radiographic or clinical findings to strongly suspect pyelonephritis.  Patient is not clinically experiencing signs or symptoms of urosepsis. Plan for treatment here with discharge and PCP follow up.   Considered admission but workup in the ED is largely reassuring. Stable for discharge.   Patient's presentation is most consistent with acute presentation with potential threat to life or bodily function.   Disposition: discharge  ____________________________________________  FINAL CLINICAL IMPRESSION(S) / ED DIAGNOSES  Final diagnoses:  Left lower quadrant abdominal pain   Note:  This document was prepared using Dragon voice recognition software and may include unintentional dictation errors.  Abby Hocking, MD, [facility] Emergency Medicine    Gordon Vandunk, Shereen Dike, MD 04/28/23 [phone]

## 2023-04-27 NOTE — ED Triage Notes (Signed)
 Pt arrived POV. Pt reports having pain in her left flank that radiates left hip. Pt reports pain started tonight around 7pm. Pt reports 2 months ago pt was informed she had a stone by her provider. Pt denies pain with urination/bowel movements.

## 2023-04-28 ENCOUNTER — Emergency Department (HOSPITAL_COMMUNITY)

## 2023-04-28 DIAGNOSIS — Z9049 Acquired absence of other specified parts of digestive tract: Secondary | ICD-10-CM | POA: Diagnosis not present

## 2023-04-28 DIAGNOSIS — K5730 Diverticulosis of large intestine without perforation or abscess without bleeding: Secondary | ICD-10-CM | POA: Diagnosis not present

## 2023-04-28 DIAGNOSIS — R109 Unspecified abdominal pain: Secondary | ICD-10-CM | POA: Diagnosis not present

## 2023-04-28 DIAGNOSIS — K760 Fatty (change of) liver, not elsewhere classified: Secondary | ICD-10-CM | POA: Diagnosis not present

## 2023-04-28 LAB — HEPATIC FUNCTION PANEL
ALT: 17 U/L (ref 0–44)
AST: 18 U/L (ref 15–41)
Albumin: 3.6 g/dL (ref 3.5–5.0)
Alkaline Phosphatase: 78 U/L (ref 38–126)
Bilirubin, Direct: <0.1 mg/dL (ref 0.0–0.2)
Total Bilirubin: 0.6 mg/dL (ref 0.0–1.2)
Total Protein: 6.3 g/dL — ABNORMAL LOW (ref 6.5–8.1)

## 2023-04-28 LAB — BASIC METABOLIC PANEL WITH GFR
Anion gap: 6 (ref 5–15)
BUN: 37 mg/dL — ABNORMAL HIGH (ref 8–23)
CO2: 21 mmol/L — ABNORMAL LOW (ref 22–32)
Calcium: 9 mg/dL (ref 8.9–10.3)
Chloride: 112 mmol/L — ABNORMAL HIGH (ref 98–111)
Creatinine, Ser: 1.11 mg/dL — ABNORMAL HIGH (ref 0.44–1.00)
GFR, Estimated: 52 mL/min — ABNORMAL LOW (ref >60)
Glucose, Bld: 185 mg/dL — ABNORMAL HIGH (ref 70–99)
Potassium: 4.9 mmol/L (ref 3.5–5.1)
Sodium: 139 mmol/L (ref 135–145)

## 2023-04-28 LAB — LIPASE, BLOOD: Lipase: 39 U/L (ref 11–51)

## 2023-04-28 MED ORDER — FOSFOMYCIN TROMETHAMINE 3 G PO PACK
3.0000 g | PACK | Freq: Once | ORAL | Status: AC
  Administered 2023-04-28: 3 g via ORAL
  Filled 2023-04-28: qty 3

## 2023-04-28 MED ORDER — IOHEXOL 300 MG/ML  SOLN
100.0000 mL | Freq: Once | INTRAMUSCULAR | Status: AC | PRN
  Administered 2023-04-28: 100 mL via INTRAVENOUS

## 2023-04-28 MED ORDER — SODIUM CHLORIDE (PF) 0.9 % IJ SOLN
INTRAMUSCULAR | Status: AC
  Filled 2023-04-28: qty 50

## 2023-04-28 NOTE — Discharge Instructions (Signed)
 You were seen in the emergency department today with abdominal pain.  Your CT scan is reassuring and does not show any kidney stones or other obvious cause of your pain.  I have treated you with a one-time dose of antibiotic here to cover for any possible urine infection.  You do not need additional antibiotic to your pharmacy.  Please follow-up with your primary care doctor.  Return with any new or suddenly worsening symptoms.

## 2023-04-30 LAB — URINE CULTURE: Culture: >=100000 — AB

## 2023-05-01 ENCOUNTER — Telehealth (HOSPITAL_BASED_OUTPATIENT_CLINIC_OR_DEPARTMENT_OTHER): Payer: Self-pay

## 2023-05-01 DIAGNOSIS — G8929 Other chronic pain: Secondary | ICD-10-CM | POA: Diagnosis not present

## 2023-05-01 DIAGNOSIS — Z7689 Persons encountering health services in other specified circumstances: Secondary | ICD-10-CM | POA: Diagnosis not present

## 2023-05-01 DIAGNOSIS — Z6841 Body Mass Index (BMI) 40.0 and over, adult: Secondary | ICD-10-CM | POA: Diagnosis not present

## 2023-05-01 DIAGNOSIS — M549 Dorsalgia, unspecified: Secondary | ICD-10-CM | POA: Diagnosis not present

## 2023-05-01 NOTE — Progress Notes (Signed)
 ED Antimicrobial Stewardship Positive Culture Follow Up   Gloria Sharp is an 78 y.o. female who presented to [facility] on 04/27/2023 with a chief complaint of  Chief Complaint  Patient presents with   Flank Pain    Left    Recent Results (from the past 720 hours)  Urine Culture     Status: Abnormal   Collection Time: 04/27/23 10:54 PM   Specimen: Urine, Clean Catch  Result Value Ref Range Status   Specimen Description   Final    URINE, CLEAN CATCH Performed at [facility], [address], [place], [place] [address]    Special Requests   Final    NONE Performed at [facility], [address], [place], [place] [address]    Culture >=100,000 COLONIES/mL ESCHERICHIA COLI (A)  Final   Report Status 04/30/2023 FINAL  Final   Organism ID, Bacteria ESCHERICHIA COLI (A)  Final      Susceptibility   Escherichia coli - MIC*    AMPICILLIN >=32 RESISTANT Resistant     CEFAZOLIN <=4 SENSITIVE Sensitive     CEFEPIME <=0.12 SENSITIVE Sensitive     CEFTRIAXONE <=0.25 SENSITIVE Sensitive     CIPROFLOXACIN 0.5 INTERMEDIATE Intermediate     GENTAMICIN <=1 SENSITIVE Sensitive     IMIPENEM <=0.25 SENSITIVE Sensitive     NITROFURANTOIN <=16 SENSITIVE Sensitive     TRIMETH/SULFA <=20 SENSITIVE Sensitive     AMPICILLIN/SULBACTAM 16 INTERMEDIATE Intermediate     PIP/TAZO <=4 SENSITIVE Sensitive ug/mL    * >=100,000 COLONIES/mL ESCHERICHIA COLI    [x]  Treated with Fosfomycin, organism susceptible to prescribed antimicrobial []  Patient discharged originally without antimicrobial agent and treatment is now indicated  New antibiotic prescription: N/a  ED Provider: Hildred Lowenstein 05/01/2023, 8:24 AM Clinical Pharmacist Monday - Friday phone -  [phone] Saturday - Sunday phone - [phone]

## 2023-05-01 NOTE — Telephone Encounter (Signed)
 Post ED Visit - Positive Culture Follow-up  Culture report reviewed by antimicrobial stewardship pharmacist: [facility] []  Court Distance, Pharm.D. []  Skeet Duke, Pharm.D., BCPS AQ-ID []  Leslee Rase, Pharm.D., BCPS []  Garland Junk, Pharm.D., BCPS []  [place], [address].D., BCPS, AAHIVP []  Alcide Aly, Pharm.D., BCPS, AAHIVP []  Jerri Morale, PharmD, BCPS []  Graham Laws, PharmD, BCPS []  Cleda Curly, PharmD, BCPS []  Tamar Fairly, PharmD []  Ballard Levels, PharmD, BCPS []  Ollen Beverage, PharmD  [facility] [x]  Estela Held, PharmD []  Sherryle Don, PharmD []  Van Gelinas, PharmD []  Delila Felty, Rph []  [place]) Cleora Daft, PharmD []  Augustina Block, PharmD []  Arie Kurtz, PharmD []  Sharlyn Deaner, PharmD []  Agnes Hose, PharmD []  Kendall Pauls, PharmD []  Gladstone Lamer, PharmD []  Armanda Bern, PharmD []  Tera Fellows, PharmD   Positive urine culture S/P Fosfomycin in the ED, no signs of Pyelo. No need for further treatment and no further patient follow-up is required at this time.  Delena Feil 05/01/2023, 9:55 AM

## 2023-05-06 DIAGNOSIS — E1159 Type 2 diabetes mellitus with other circulatory complications: Secondary | ICD-10-CM | POA: Diagnosis not present

## 2023-05-06 DIAGNOSIS — E1129 Type 2 diabetes mellitus with other diabetic kidney complication: Secondary | ICD-10-CM | POA: Diagnosis not present

## 2023-05-06 DIAGNOSIS — E782 Mixed hyperlipidemia: Secondary | ICD-10-CM | POA: Diagnosis not present

## 2023-05-13 DIAGNOSIS — Z6841 Body Mass Index (BMI) 40.0 and over, adult: Secondary | ICD-10-CM | POA: Diagnosis not present

## 2023-05-13 DIAGNOSIS — R042 Hemoptysis: Secondary | ICD-10-CM | POA: Diagnosis not present

## 2023-05-13 DIAGNOSIS — E1129 Type 2 diabetes mellitus with other diabetic kidney complication: Secondary | ICD-10-CM | POA: Diagnosis not present

## 2023-05-15 DIAGNOSIS — E1159 Type 2 diabetes mellitus with other circulatory complications: Secondary | ICD-10-CM | POA: Diagnosis not present

## 2023-05-15 DIAGNOSIS — D649 Anemia, unspecified: Secondary | ICD-10-CM | POA: Diagnosis not present

## 2023-05-15 DIAGNOSIS — I152 Hypertension secondary to endocrine disorders: Secondary | ICD-10-CM | POA: Diagnosis not present

## 2023-05-15 DIAGNOSIS — E782 Mixed hyperlipidemia: Secondary | ICD-10-CM | POA: Diagnosis not present

## 2023-05-15 DIAGNOSIS — N1831 Chronic kidney disease, stage 3a: Secondary | ICD-10-CM | POA: Diagnosis not present

## 2023-05-15 DIAGNOSIS — E1129 Type 2 diabetes mellitus with other diabetic kidney complication: Secondary | ICD-10-CM | POA: Diagnosis not present

## 2023-05-15 DIAGNOSIS — Z6841 Body Mass Index (BMI) 40.0 and over, adult: Secondary | ICD-10-CM | POA: Diagnosis not present

## 2023-05-16 DIAGNOSIS — E782 Mixed hyperlipidemia: Secondary | ICD-10-CM | POA: Diagnosis not present

## 2023-05-16 DIAGNOSIS — I152 Hypertension secondary to endocrine disorders: Secondary | ICD-10-CM | POA: Diagnosis not present

## 2023-05-24 ENCOUNTER — Encounter (INDEPENDENT_AMBULATORY_CARE_PROVIDER_SITE_OTHER): Payer: Self-pay | Admitting: Otolaryngology

## 2023-06-15 DIAGNOSIS — E1159 Type 2 diabetes mellitus with other circulatory complications: Secondary | ICD-10-CM | POA: Diagnosis not present

## 2023-06-15 DIAGNOSIS — I152 Hypertension secondary to endocrine disorders: Secondary | ICD-10-CM | POA: Diagnosis not present

## 2023-06-16 DIAGNOSIS — E1159 Type 2 diabetes mellitus with other circulatory complications: Secondary | ICD-10-CM | POA: Diagnosis not present

## 2023-06-16 DIAGNOSIS — I152 Hypertension secondary to endocrine disorders: Secondary | ICD-10-CM | POA: Diagnosis not present

## 2023-06-17 DIAGNOSIS — Z5321 Procedure and treatment not carried out due to patient leaving prior to being seen by health care provider: Secondary | ICD-10-CM | POA: Diagnosis not present

## 2023-06-17 DIAGNOSIS — M549 Dorsalgia, unspecified: Secondary | ICD-10-CM | POA: Diagnosis not present

## 2023-06-17 DIAGNOSIS — R531 Weakness: Secondary | ICD-10-CM | POA: Diagnosis not present

## 2023-06-17 DIAGNOSIS — R609 Edema, unspecified: Secondary | ICD-10-CM | POA: Diagnosis not present

## 2023-06-17 DIAGNOSIS — R079 Chest pain, unspecified: Secondary | ICD-10-CM | POA: Diagnosis not present

## 2023-06-19 ENCOUNTER — Encounter (HOSPITAL_COMMUNITY): Payer: Self-pay | Admitting: *Deleted

## 2023-06-19 ENCOUNTER — Emergency Department (HOSPITAL_COMMUNITY)

## 2023-06-19 ENCOUNTER — Emergency Department (HOSPITAL_COMMUNITY)
Admission: EM | Admit: 2023-06-19 | Discharge: 2023-06-20 | Disposition: A | Discharge status: Home / Self Care | Attending: Emergency Medicine | Admitting: Emergency Medicine

## 2023-06-19 ENCOUNTER — Other Ambulatory Visit: Payer: Self-pay

## 2023-06-19 DIAGNOSIS — Z79899 Other long term (current) drug therapy: Secondary | ICD-10-CM | POA: Insufficient documentation

## 2023-06-19 DIAGNOSIS — E119 Type 2 diabetes mellitus without complications: Secondary | ICD-10-CM | POA: Diagnosis not present

## 2023-06-19 DIAGNOSIS — I110 Hypertensive heart disease with heart failure: Secondary | ICD-10-CM | POA: Diagnosis not present

## 2023-06-19 DIAGNOSIS — R0789 Other chest pain: Secondary | ICD-10-CM | POA: Diagnosis present

## 2023-06-19 DIAGNOSIS — I209 Angina pectoris, unspecified: Secondary | ICD-10-CM | POA: Diagnosis not present

## 2023-06-19 DIAGNOSIS — I509 Heart failure, unspecified: Secondary | ICD-10-CM | POA: Diagnosis not present

## 2023-06-19 DIAGNOSIS — I2089 Other forms of angina pectoris: Secondary | ICD-10-CM | POA: Insufficient documentation

## 2023-06-19 DIAGNOSIS — I2510 Atherosclerotic heart disease of native coronary artery without angina pectoris: Secondary | ICD-10-CM | POA: Diagnosis not present

## 2023-06-19 DIAGNOSIS — Z6841 Body Mass Index (BMI) 40.0 and over, adult: Secondary | ICD-10-CM | POA: Diagnosis not present

## 2023-06-19 DIAGNOSIS — Z7984 Long term (current) use of oral hypoglycemic drugs: Secondary | ICD-10-CM | POA: Insufficient documentation

## 2023-06-19 DIAGNOSIS — I200 Unstable angina: Secondary | ICD-10-CM | POA: Diagnosis not present

## 2023-06-19 DIAGNOSIS — R079 Chest pain, unspecified: Secondary | ICD-10-CM | POA: Diagnosis not present

## 2023-06-19 DIAGNOSIS — I5022 Chronic systolic (congestive) heart failure: Secondary | ICD-10-CM

## 2023-06-19 LAB — COMPREHENSIVE METABOLIC PANEL WITH GFR
ALT: 22 U/L (ref 0–44)
AST: 24 U/L (ref 15–41)
Albumin: 3.5 g/dL (ref 3.5–5.0)
Alkaline Phosphatase: 62 U/L (ref 38–126)
Anion gap: 10 (ref 5–15)
BUN: 26 mg/dL — ABNORMAL HIGH (ref 8–23)
CO2: 20 mmol/L — ABNORMAL LOW (ref 22–32)
Calcium: 9.2 mg/dL (ref 8.9–10.3)
Chloride: 108 mmol/L (ref 98–111)
Creatinine, Ser: 1.05 mg/dL — ABNORMAL HIGH (ref 0.44–1.00)
GFR, Estimated: 55 mL/min — ABNORMAL LOW (ref >60)
Glucose, Bld: 179 mg/dL — ABNORMAL HIGH (ref 70–99)
Potassium: 4.7 mmol/L (ref 3.5–5.1)
Sodium: 138 mmol/L (ref 135–145)
Total Bilirubin: 0.7 mg/dL (ref 0.0–1.2)
Total Protein: 6.2 g/dL — ABNORMAL LOW (ref 6.5–8.1)

## 2023-06-19 LAB — CBC WITH DIFFERENTIAL/PLATELET
Abs Immature Granulocytes: 0.03 10*3/uL (ref 0.00–0.07)
Basophils Absolute: 0 10*3/uL (ref 0.0–0.1)
Basophils Relative: 0 %
Eosinophils Absolute: 0.2 10*3/uL (ref 0.0–0.5)
Eosinophils Relative: 2 %
HCT: 29.2 % — ABNORMAL LOW (ref 36.0–46.0)
Hemoglobin: 9.1 g/dL — ABNORMAL LOW (ref 12.0–15.0)
Immature Granulocytes: 0 %
Lymphocytes Relative: 22 %
Lymphs Abs: 2.1 10*3/uL (ref 0.7–4.0)
MCH: 28.6 pg (ref 26.0–34.0)
MCHC: 31.2 g/dL (ref 30.0–36.0)
MCV: 91.8 fL (ref 80.0–100.0)
Monocytes Absolute: 0.8 10*3/uL (ref 0.1–1.0)
Monocytes Relative: 8 %
Neutro Abs: 6.6 10*3/uL (ref 1.7–7.7)
Neutrophils Relative %: 68 %
Platelets: 344 10*3/uL (ref 150–400)
RBC: 3.18 MIL/uL — ABNORMAL LOW (ref 3.87–5.11)
RDW: 13.6 % (ref 11.5–15.5)
WBC: 9.8 10*3/uL (ref 4.0–10.5)
nRBC: 0 % (ref 0.0–0.2)

## 2023-06-19 LAB — TROPONIN I (HIGH SENSITIVITY)
Troponin I (High Sensitivity): 16 ng/L (ref <18)
Troponin I (High Sensitivity): 19 ng/L — ABNORMAL HIGH (ref <18)

## 2023-06-19 LAB — BRAIN NATRIURETIC PEPTIDE: B Natriuretic Peptide: 95.4 pg/mL (ref 0.0–100.0)

## 2023-06-19 MED ORDER — NITROGLYCERIN 0.4 MG SL SUBL: 0.4000 mg | SUBLINGUAL_TABLET | SUBLINGUAL | 0 refills | Status: DC | PRN

## 2023-06-19 NOTE — ED Notes (Signed)
 CCMD contacted for monitoring

## 2023-06-19 NOTE — Discharge Instructions (Addendum)
 I would like for you to take a full metoprolol  daily instead of half a tablet until you follow-up with your cardiologist.  I also prescribed another medication which will help you and your symptoms.  Please take this as prescribed.  I would like for you to follow-up with your cardiologist for further evaluation. Please return to the emergency department for chest pain or shortness of breath that does not go away with rest or any other concerns you might have.

## 2023-06-19 NOTE — ED Provider Notes (Signed)
 [place] EMERGENCY DEPARTMENT AT [facility] Provider Note   CSN: [id number] Arrival date & time: 06/19/23  1837     History Chief Complaint  Patient presents with   Abnormal ECG    [name] is a 78 y.o. female with history of type 2 diabetes, fibromyalgia, hyperlipidemia, hypertension, and CHF who presents to the emergency room today for further evaluation of exertional chest pain and shortness of breath.  Symptoms started a couple of weeks ago.  She states that primarily she gets chest tightness and shortness of breath with exertion.  This completely resolves with rest.  She was seen evaluated at her primary care doctor and was found to have EKG changes.  She was sent to the emergency department today for further evaluation.  Patient currently is asymptomatic.  HPI     Home Medications Prior to Admission medications   Medication Sig Start Date End Date Taking? Authorizing Provider  nitroGLYCERIN  (NITROSTAT ) 0.4 MG SL tablet Place 1 tablet (0.4 mg total) under the tongue every 5 (five) minutes as needed for chest pain. 06/19/23  Yes Jamal Mays, Kaipo Ardis M, PA-C  acetaminophen  (TYLENOL ) 650 MG CR tablet Take 650 mg by mouth daily as needed for pain.    [provider]  alendronate (FOSAMAX) 35 MG tablet Take 35 mg by mouth every 7 (seven) days.    [provider]  atorvastatin  (LIPITOR ) 80 MG tablet Take 1 tablet (80 mg total) by mouth daily. 01/31/23   Krasowski, Robert J, MD  brimonidine  (ALPHAGAN ) 0.15 % ophthalmic solution Place 1 drop into both eyes 2 (two) times daily. 08/10/19   [provider]  cholestyramine  (QUESTRAN ) 4 g packet Take 1 packet (4 g total) by mouth daily. Take at least 2 hours before or after rest of the medications 03/25/19   Lajuan Pila, MD  clopidogrel  (PLAVIX ) 75 MG tablet Take 1 tablet (75 mg total) by mouth daily. 05/18/22   Williams, Evan, PA-C  Cyanocobalamin (VITAMIN B-12 PO) Take 1 tablet by mouth daily in the  afternoon.    [provider]  diclofenac  Sodium (VOLTAREN ) 1 % GEL Apply 2 g topically at bedtime as needed (knee pain).    [provider]  fenofibrate  (TRICOR ) 145 MG tablet Take 145 mg by mouth daily.    [provider]  ferrous sulfate 325 (65 FE) MG EC tablet Take 325 mg by mouth daily in the afternoon.    [provider]  gabapentin  (NEURONTIN ) 300 MG capsule Take 300 mg by mouth 3 (three) times daily.    [provider]  Infant Care Products [facility]) OINT Apply 1 application  topically 2 (two) times daily as needed (itching).    [provider]  MAGNESIUM  PO Take 1 tablet by mouth daily in the afternoon.    [provider]  metFORMIN (GLUCOPHAGE) 1000 MG tablet Take 1,000 mg by mouth 2 (two) times daily with a meal.    [provider]  metoprolol  succinate (TOPROL  XL) 25 MG 24 hr tablet Take 0.5 tablets (12.5 mg total) by mouth daily. 11/02/22   Bensimhon, Rheta Celestine, MD  Omega-3 Fatty Acids (FISH OIL PO) Take 1 capsule by mouth daily in the afternoon.    [provider]  OVER THE COUNTER MEDICATION Apply 1 patch topically daily as needed (back pain). Coralite pain patch    [provider]  pantoprazole  (PROTONIX ) 40 MG tablet Take 40 mg by mouth daily.    [provider]  sacubitril -valsartan  (ENTRESTO ) 24-26 MG Take 1 tablet by mouth 2 (two) times daily. 12/03/22   Leona Rake, MD  sertraline  (ZOLOFT ) 50 MG tablet Take 50 mg by mouth daily.    [provider]  spironolactone  (ALDACTONE ) 25 MG tablet Take 0.5 tablets (12.5 mg total) by mouth daily. 03/04/23   Krasowski, Robert J, MD  tiZANidine  (ZANAFLEX ) 2 MG tablet Take 2 mg by mouth every 6 (six) hours as needed for muscle spasms.    [provider]  TURMERIC PO Take 1 tablet by mouth daily in the afternoon.    [provider]      Allergies    Celebrex [celecoxib], Keflex  [cephalexin ], Relafen  [nabumetone], Cipro [ciprofloxacin hcl], and Flagyl [metronidazole]    Review of Systems   Review of Systems  All other systems reviewed and are negative.   Physical Exam Updated Vital Signs BP (!) 138/53   Pulse 75   Temp 98.1 F (36.7 C) (Oral)   Resp 20   SpO2 99%  Physical Exam Vitals and nursing note reviewed.  Constitutional:      General: She is not in acute distress.    Appearance: Normal appearance.  HENT:     Head: Normocephalic and atraumatic.  Eyes:     General:        Right eye: No discharge.        Left eye: No discharge.  Cardiovascular:     Comments: Regular rate and rhythm.  S1/S2 are distinct without any evidence of murmur, rubs, or gallops.  Radial pulses are 2+ bilaterally.  Dorsalis pedis pulses are 2+ bilaterally.  No evidence of pedal edema. Pulmonary:     Comments: Clear to auscultation bilaterally.  Normal effort.  No respiratory distress.  No evidence of wheezes, rales, or rhonchi heard throughout. Abdominal:     General: Abdomen is flat. Bowel sounds are normal. There is no distension.     Tenderness: There is no abdominal tenderness. There is no guarding or rebound.  Musculoskeletal:        General: Normal range of motion.     Cervical back: Neck supple.  Skin:    General: Skin is warm and dry.     Findings: No rash.  Neurological:     General: No focal deficit present.     Mental Status: She is alert.  Psychiatric:        Mood and Affect: Mood normal.        Behavior: Behavior normal.     ED Results / Procedures / Treatments   Labs (all labs ordered are listed, but only abnormal results are displayed) Labs Reviewed  CBC WITH DIFFERENTIAL/PLATELET - Abnormal; Notable for the following components:      Result Value   RBC 3.18 (*)    Hemoglobin 9.1 (*)    HCT 29.2 (*)    All other components within normal limits  COMPREHENSIVE METABOLIC PANEL WITH GFR - Abnormal; Notable for the following components:   CO2 20 (*)    Glucose, Bld  179 (*)    BUN 26 (*)    Creatinine, Ser 1.05 (*)    Total Protein 6.2 (*)    GFR, Estimated 55 (*)    All other components within normal limits  TROPONIN I (HIGH SENSITIVITY) - Abnormal; Notable for the following components:   Troponin I (High Sensitivity) 19 (*)    All other components within normal limits  BRAIN NATRIURETIC PEPTIDE  TROPONIN I (HIGH SENSITIVITY)  TROPONIN I (  HIGH SENSITIVITY)    EKG EKG Interpretation Date/Time:  Wednesday June 19 2023 19:48:21 EDT Ventricular Rate:  88 PR Interval:  152 QRS Duration:  130 QT Interval:  410 QTC Calculation: 497 R Axis:   74  Text Interpretation: Sinus rhythm IVCD, consider atypical LBBB Confirmed by Rosealee Concha ([id number]) on 06/19/2023 9:34:56 PM  Radiology DG Chest Port 1 View Result Date: 06/19/2023 CLINICAL DATA:  Chest pain EXAM: PORTABLE CHEST 1 VIEW COMPARISON:  Chest x-ray 06/17/2023 FINDINGS: The heart size and mediastinal contours are within normal limits. Both lungs are clear. The visualized skeletal structures are unremarkable. IMPRESSION: No active disease. Electronically Signed   By: Tyron Gallon M.D.   On: 06/19/2023 19:25    Procedures Procedures    Medications Ordered in ED Medications - No data to display  ED Course/ Medical Decision Making/ A&P   {   Click here for ABCD2, HEART and other calculators  Medical Decision Making Gloria Sharp is a 78 y.o. female patient who presents to the emergency ferment today for further evaluation of chest pain.  Story is somewhat concerning for stable angina.  Patient's troponins are about the same and not that concerning.  EKG is also reassuring.  Chest x-ray is negative.  Again patient is completely asymptomatic here and has no evidence of chest pain.  I spoke with cardiology on the phone and we are both in agreement that this can be handled in the outpatient setting.  Will plan to increase her metoprolol  to 25 mg/day and give her a prescription for nitroglycerin .  Will  plan to discharge home with cardiology follow-up.  Strict return precautions were discussed.    Amount and/or Complexity of Data Reviewed Labs: ordered.  Risk Prescription drug management.    Final Clinical Impression(s) / ED Diagnoses Final diagnoses:  Stable angina (HCC)    Rx / DC Orders ED Discharge Orders          Ordered    nitroGLYCERIN  (NITROSTAT ) 0.4 MG SL tablet  Every 5 min PRN        06/19/23 2305              Darletta Ehrich, PA-C 06/19/23 2329    Rosealee Concha, MD 06/20/23 [phone]

## 2023-06-19 NOTE — ED Notes (Signed)
 Pt c/o chest burning; under left breast, nonradiating. Repeat EKG captured

## 2023-06-19 NOTE — ED Triage Notes (Signed)
 Pt is here for evaluation of chest pressure with exertion/activity and sob with exertion/activity.  Pt has had some EKG changes

## 2023-06-19 NOTE — ED Provider Triage Note (Signed)
 Emergency Medicine Provider Triage Evaluation Note  Gloria Sharp , a 78 y.o. female  was evaluated in triage.  Pt complains of chest discomfort and shortness of breath with exertion.   Positive: Chest discomfort Negative: fever  Physical Exam  BP (!) 125/109   Pulse 86   Temp 98.2 F (36.8 C)   Resp 16   SpO2 100%  Gen:   Awake, no distress   Resp:  Normal effort  MSK:   Moves extremities without difficulty  Other:    Medical Decision Making  Medically screening exam initiated at 6:53 PM.  Appropriate orders placed.  Raj Burdock was informed that the remainder of the evaluation will be completed by another provider, this initial triage assessment does not replace that evaluation, and the importance of remaining in the ED until their evaluation is complete.     Sandi Crosby, PA-C 06/19/23 [phone]

## 2023-06-20 ENCOUNTER — Other Ambulatory Visit: Payer: Self-pay | Admitting: Student in an Organized Health Care Education/Training Program

## 2023-06-20 MED ORDER — METOPROLOL SUCCINATE ER 25 MG PO TB24: 12.5000 mg | ORAL_TABLET | Freq: Every day | ORAL | 3 refills | Status: AC

## 2023-06-20 MED ORDER — ISOSORBIDE MONONITRATE ER 30 MG PO TB24: 30.0000 mg | ORAL_TABLET | Freq: Every day | ORAL | 1 refills | Status: DC

## 2023-06-20 NOTE — ED Notes (Signed)
 Cardiology at bedside.

## 2023-06-20 NOTE — Progress Notes (Addendum)
 Cardiology Admission History and Physical:   Patient ID: Gloria Sharp MRN: [id number]; DOB: 1945-07-18   Admission date: 06/19/2023  Primary Care Provider: Lonie Roa, MD Primary Cardiologist: Ralene Burger, MD  Primary Electrophysiologist:  None   Chief Complaint:  Shortness of Breath   History of Present Illness:   Gloria Sharp states that for the last 2 weeks she has been getting shortness of breath with exertion. These episodes improved with rest. She can get it when she is walking up to get her car keys are walking around [place]. She does not get them every time with exertion. Associated chest pressure. No diaphoresis.  Of note she has had shortness of breath for a long period of time, however these random episodes have become more frequent  in the last 2 weeks   Past Medical History:  Diagnosis Date   AKI (acute kidney injury) (HCC) 03/27/2021   Angina pectoris (HCC) 05/15/2022   Aortic stenosis 09/04/2019   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 sciatica 02/12/2022   Class 3 obesity 03/26/2021   CVA (cerebral vascular accident) (HCC) 2015?   Cystocele with uterine prolapse 01/17/2018   Diabetes (HCC)    Dizziness 03/27/2021   Dyslipidemia 10/10/2018   Essential hypertension 10/10/2018   Fibromyalgia    GERD (gastroesophageal reflux disease)    Heart murmur 10/10/2018   History of CVA (cerebrovascular accident) 10/10/2018   Hydronephrosis of right kidney 03/26/2021   Hypercholesterolemia    Hypertension    Hypokalemia 03/27/2021   Leukocytosis    Mitral regurgitation 11/24/2018   Nonsustained ventricular tachycardia (HCC) 11/24/2018   Obesity    Post-operative nausea and vomiting    "hard to wake up"   Rectocele 01/17/2018   Sacroiliitis (HCC) 02/12/2022   Sepsis secondary to UTI (HCC) 03/26/2021   Type 2 diabetes mellitus with hyperglycemia (HCC) 03/26/2021   Type 2 diabetes mellitus without complication, without long-term current use of insulin  (HCC)  10/10/2018    Past Surgical History:  Procedure Laterality Date   ABDOMINAL HYSTERECTOMY     partial, left the ovaries   APPENDECTOMY     BREAST CYST ASPIRATION Right    COLONOSCOPY  11/11/2013   Colonioc polyps status post polypectomy. Pancolonic diverticulosis predominately in the sigmoid colon   CYSTOSCOPY W/ URETERAL STENT PLACEMENT Right 03/26/2021   Procedure: CYSTOSCOPY WITH RETROGRADE PYELOGRAM/URETERAL STENT PLACEMENT;  Surgeon: Lahoma Pigg, MD;  Location: [facility];  Service: Urology;  Laterality: Right;   CYSTOSCOPY/URETEROSCOPY/HOLMIUM LASER/STENT PLACEMENT Right 04/17/2021   Procedure: CYSTOSCOPY/ RETROGRADE/URETEROSCOPY/HOLMIUM LASER/STENT PLACEMENT;  Surgeon: Lahoma Pigg, MD;  Location: [facility];  Service: Urology;  Laterality: Right;  ONLY NEEDS 60 MIN   DILATION AND CURETTAGE OF UTERUS     GALLBLADDER SURGERY Right 07/2014   HERNIA REPAIR  02/20/2021   Umbilical   LEFT HEART CATH AND CORONARY ANGIOGRAPHY N/A 05/16/2022   Procedure: LEFT HEART CATH AND CORONARY ANGIOGRAPHY;  Surgeon: Wenona Hamilton, MD;  Location: [facility];  Service: Cardiovascular;  Laterality: N/A;   RIGHT/LEFT HEART CATH AND CORONARY ANGIOGRAPHY N/A 08/30/2022   Procedure: RIGHT/LEFT HEART CATH AND CORONARY ANGIOGRAPHY;  Surgeon: Kyra Phy, MD;  Location: [facility];  Service: Cardiovascular;  Laterality: N/A;   TEE WITHOUT CARDIOVERSION N/A 09/03/2022   Procedure: TRANSESOPHAGEAL ECHOCARDIOGRAM;  Surgeon: Alwin Baars, DO;  Location: [facility];  Service: Cardiovascular;  Laterality: N/A;   vaginal polyp removal       Medications Prior to Admission: Prior to  Admission medications   Medication Sig Start Date End Date Taking? Authorizing Provider  isosorbide mononitrate (IMDUR) 30 MG 24 hr tablet Take 1 tablet (30 mg total) by mouth daily. 06/20/23  Yes Edson Graces, MD  nitroGLYCERIN  (NITROSTAT ) 0.4 MG SL tablet Place 1 tablet (0.4 mg total) under the tongue  every 5 (five) minutes as needed for chest pain. 06/19/23  Yes Jamal Mays, Conner M, PA-C  acetaminophen  (TYLENOL ) 650 MG CR tablet Take 650 mg by mouth daily as needed for pain.    [provider]  alendronate (FOSAMAX) 35 MG tablet Take 35 mg by mouth every 7 (seven) days.    [provider]  atorvastatin  (LIPITOR ) 80 MG tablet Take 1 tablet (80 mg total) by mouth daily. 01/31/23   Krasowski, Robert J, MD  brimonidine  (ALPHAGAN ) 0.15 % ophthalmic solution Place 1 drop into both eyes 2 (two) times daily. 08/10/19   [provider]  cholestyramine  (QUESTRAN ) 4 g packet Take 1 packet (4 g total) by mouth daily. Take at least 2 hours before or after rest of the medications 03/25/19   Lajuan Pila, MD  clopidogrel  (PLAVIX ) 75 MG tablet Take 1 tablet (75 mg total) by mouth daily. 05/18/22   Williams, Evan, PA-C  Cyanocobalamin (VITAMIN B-12 PO) Take 1 tablet by mouth daily in the afternoon.    [provider]  diclofenac  Sodium (VOLTAREN ) 1 % GEL Apply 2 g topically at bedtime as needed (knee pain).    [provider]  fenofibrate  (TRICOR ) 145 MG tablet Take 145 mg by mouth daily.    [provider]  ferrous sulfate 325 (65 FE) MG EC tablet Take 325 mg by mouth daily in the afternoon.    [provider]  gabapentin  (NEURONTIN ) 300 MG capsule Take 300 mg by mouth 3 (three) times daily.    [provider]  Infant Care Products [facility]) OINT Apply 1 application  topically 2 (two) times daily as needed (itching).    [provider]  MAGNESIUM  PO Take 1 tablet by mouth daily in the afternoon.    [provider]  metFORMIN (GLUCOPHAGE) 1000 MG tablet Take 1,000 mg by mouth 2 (two) times daily with a meal.    [provider]  metoprolol  succinate (TOPROL  XL) 25 MG 24 hr tablet Take 0.5 tablets (12.5 mg total) by mouth daily. 06/20/23   Edson Graces, MD  Omega-3 Fatty Acids (FISH OIL PO) Take 1 capsule by mouth daily  in the afternoon.    [provider]  OVER THE COUNTER MEDICATION Apply 1 patch topically daily as needed (back pain). Coralite pain patch    [provider]  pantoprazole  (PROTONIX ) 40 MG tablet Take 40 mg by mouth daily.    [provider]  sacubitril -valsartan  (ENTRESTO ) 24-26 MG Take 1 tablet by mouth 2 (two) times daily. 12/03/22   Leona Rake, MD  sertraline  (ZOLOFT ) 50 MG tablet Take 50 mg by mouth daily.    [provider]  spironolactone  (ALDACTONE ) 25 MG tablet Take 0.5 tablets (12.5 mg total) by mouth daily. 03/04/23   Krasowski, Robert J, MD  tiZANidine  (ZANAFLEX ) 2 MG tablet Take 2 mg by mouth every 6 (six) hours as needed for muscle spasms.    [provider]  TURMERIC PO Take 1 tablet by mouth daily in the afternoon.    [provider]     Allergies:    Allergies  Allergen Reactions   Celebrex [Celecoxib] Itching and Swelling  Swelling throughout body Skin redness    Keflex  [Cephalexin ] Itching    09/2018 note: tolerated Augmentin   Relafen [Nabumetone] Itching and Swelling   Cipro [Ciprofloxacin Hcl] Hives, Itching and Rash   Flagyl [Metronidazole] Itching, Swelling and Rash    Social History:   Social History   Socioeconomic History   Marital status: Married    Spouse name: Not on file   Number of children: Not on file   Years of education: Not on file   Highest education level: Not on file  Occupational History   Not on file  Tobacco Use   Smoking status: Never   Smokeless tobacco: Never  Vaping Use   Vaping status: Never Used  Substance and Sexual Activity   Alcohol  use: Not Currently   Drug use: Never   Sexual activity: Not on file  Other Topics Concern   Not on file  Social History Narrative   Not on file   Social Drivers of Health   Financial Resource Strain: Not on file  Food Insecurity: No Food Insecurity (05/15/2022)   Hunger Vital Sign    Worried About Running Out of Food in the  Last Year: Never true    Ran Out of Food in the Last Year: Never true  Transportation Needs: No Transportation Needs (05/15/2022)   PRAPARE - Administrator, Civil Service (Medical): No    Lack of Transportation (Non-Medical): No  Physical Activity: Not on file  Stress: Not on file  Social Connections: Not on file  Intimate Partner Violence: Not At Risk (05/15/2022)   Humiliation, Afraid, Rape, and Kick questionnaire    Fear of Current or Ex-Partner: No    Emotionally Abused: No    Physically Abused: No    Sexually Abused: No    Family History:   The patient's family history includes Cancer in her mother; Diabetes in her mother; Heart disease in her father; Heart failure in her mother; Hypertension in her father and mother; Stroke in her mother. There is no history of Colon cancer, Esophageal cancer, Stomach cancer, Rectal cancer, or Colon polyps.    Physical Exam/Data:   Vitals:   06/19/23 1849 06/19/23 1930 06/19/23 2230 06/20/23 0030  BP: (!) 125/109 (!) 123/40 (!) 138/53 (!) 132/53  Pulse: 86 74 75 74  Resp: 16 16 20 20   Temp: 98.2 F (36.8 C)  98.1 F (36.7 C)   TempSrc:   Oral   SpO2: 100% 100% 99% 99%   No intake or output data in the 24 hours ending 06/20/23 0407 There were no vitals filed for this visit. There is no height or weight on file to calculate BMI.  General:  Well nourished, well developed, in no acute distress HEENT: normal Lymph: no adenopathy Neck: JVP below the clavicles at 60 degrees  Endocrine:  No thryomegaly Cardiac:  normal S1, S2 Lungs:  clear to auscultation bilaterally Ext: no edema Musculoskeletal:  No deformities, BUE and BLE strength normal and equal Skin: warm and dry   Laboratory Data:  Chemistry Recent Labs  Lab 06/19/23 1853  NA 138  K 4.7  CL 108  CO2 20*  GLUCOSE 179*  BUN 26*  CREATININE 1.05*  CALCIUM  9.2  GFRNONAA 55*  ANIONGAP 10    Recent Labs  Lab 06/19/23 1853  PROT 6.2*  ALBUMIN 3.5  AST 24   ALT 22  ALKPHOS 62  BILITOT 0.7   Hematology Recent Labs  Lab 06/19/23 1853  WBC 9.8  RBC 3.18*  HGB 9.1*  HCT 29.2*  MCV 91.8  MCH 28.6  MCHC 31.2  RDW 13.6  PLT 344   Cardiac EnzymesNo results for input(s): "TROPONINI" in the last 168 hours. No results for input(s): "TROPIPOC" in the last 168 hours.  BNP Recent Labs  Lab 06/19/23 2101  BNP 95.4    DDimer No results for input(s): "DDIMER" in the last 168 hours.  Radiology/Studies:  DG Chest Port 1 View Result Date: 06/19/2023 CLINICAL DATA:  Chest pain EXAM: PORTABLE CHEST 1 VIEW COMPARISON:  Chest x-ray 06/17/2023 FINDINGS: The heart size and mediastinal contours are within normal limits. Both lungs are clear. The visualized skeletal structures are unremarkable. IMPRESSION: No active disease. Electronically Signed   By: Tyron Gallon M.D.   On: 06/19/2023 19:25    Assessment and Plan:   Gloria Sharp presented to the ED with shortness of breath with exertion concerning for stable angina. ED team with plans to discharge the patient, however prior to discharge the patient requested to speak to cardiology.   Gloria Sharp described shortness of breath with exertion that improves with rest. Differential is CAD vs. Worsening of MR vs. Severe AS. 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 She also has  moderate-severe MR and what was interpreted as mild AS on her ECHO.   Her symptoms when discussed were initally consistent with stable angina and made the plan with ED to increase her betablocker and give her nitroglycerin  daily and PRN for pain. However, upon further review of her chart, her images, and meeting her and her huband and daughter, I believer her symptoms could be related to AS or CAD. ECHO report summary states she has mild stenosis, but I reviewed the images after her discharge. She appears to have more severe disease (Normal LVEF, AVA 0.84 cm, mean gradient 25, DVI 0.25, SVi 34.69) with normal  E, low flow state.   Plan: Increase metoprolol  succinate from 12.5 mg to 25 mg  In the setting of severe AS, we will not begin imdur and SL NTG. I called Mylinda Asa ( daughter after discharge) and dicussed changes to plan above. I have messaged cardiologist to schedule prompt follow up If symptoms worsen, they will page the on-call cardiology and return to ED   Velvet Gibbs, MD MS  Cardiology [place]  Signed, Renelda Carry, MD  06/20/2023 4:07 AM

## 2023-06-20 NOTE — Progress Notes (Signed)
 Cancelled prescription in Epic for imdur and SL NTG given her AS. [facility] to cancel the order.   Velvet Gibbs, MD MS  Cardiology [place]

## 2023-06-21 ENCOUNTER — Ambulatory Visit: Attending: Cardiology | Admitting: Cardiology

## 2023-06-21 ENCOUNTER — Encounter: Payer: Self-pay | Admitting: Cardiology

## 2023-06-21 VITALS — BP 118/70 | HR 88 | Ht 60.0 in | Wt 213.6 lb

## 2023-06-21 DIAGNOSIS — Z8673 Personal history of transient ischemic attack (TIA), and cerebral infarction without residual deficits: Secondary | ICD-10-CM | POA: Diagnosis not present

## 2023-06-21 DIAGNOSIS — I350 Nonrheumatic aortic (valve) stenosis: Secondary | ICD-10-CM | POA: Diagnosis not present

## 2023-06-21 DIAGNOSIS — I340 Nonrheumatic mitral (valve) insufficiency: Secondary | ICD-10-CM

## 2023-06-21 DIAGNOSIS — I5022 Chronic systolic (congestive) heart failure: Secondary | ICD-10-CM

## 2023-06-21 DIAGNOSIS — I10 Essential (primary) hypertension: Secondary | ICD-10-CM | POA: Diagnosis not present

## 2023-06-21 DIAGNOSIS — I361 Nonrheumatic tricuspid (valve) insufficiency: Secondary | ICD-10-CM | POA: Diagnosis not present

## 2023-06-21 DIAGNOSIS — I351 Nonrheumatic aortic (valve) insufficiency: Secondary | ICD-10-CM | POA: Diagnosis not present

## 2023-06-21 DIAGNOSIS — I081 Rheumatic disorders of both mitral and tricuspid valves: Secondary | ICD-10-CM | POA: Diagnosis not present

## 2023-06-21 MED ORDER — SACUBITRIL-VALSARTAN 24-26 MG PO TABS: 1.0000 | ORAL_TABLET | Freq: Two times a day (BID) | ORAL | 1 refills | Status: DC

## 2023-06-21 NOTE — Patient Instructions (Signed)
 Medication Instructions:  Hold: Day of Procedure spironolactone  (ALDACTONE ) 25 MG tablet  sacubitril -valsartan  (ENTRESTO ) 24-26 MG   Continue Plavix     *If you need a refill on your cardiac medications before your next appointment, please call your pharmacy*   Lab Work: BMP- CBC - completed  If you have labs (blood work) drawn today and your tests are completely normal, you will receive your results only by: MyChart Message (if you have MyChart) OR A paper copy in the mail If you have any lab test that is abnormal or we need to change your treatment, we will call you to review the results.   Testing/Procedures:  [place] [facility] DEPT OF [facility] [place] [address] [place] [address] Dept: [phone] Loc: [phone]  Gloria Sharp  06/21/2023  You are scheduled for a Cardiac Catheterization on Wednesday, June 11 with Dr. Knox Perl.  1. Please arrive at the [facility] (Main Entrance A) at [facility]: [address] [place], [place] [address] at 9:30 AM (This time is 2 hour(s) before your procedure to ensure your preparation).   Free valet parking service is available. You will check in at ADMITTING. The support person will be asked to wait in the waiting room.  It is OK to have someone drop you off and come back when you are ready to be discharged.    Special note: Every effort is made to have your procedure done on time. Please understand that emergencies sometimes delay scheduled procedures.  2. Diet: Do not eat solid foods after midnight.  The patient may have clear liquids until 5am upon the day of the procedure.  3. Labs: BMP-CBC-completed  4. Medication instructions in preparation for your procedure:   Contrast Allergy: No     Stop taking, Diovan  (Valsartan ) Wednesday, June 11,, Spironolactone  Wednesday, June 11,,   Wednesday, June 11,    Do not take Diabetes Med Glucophage  (Metformin) on the day of the procedure and HOLD 48 HOURS AFTER THE PROCEDURE.  On the morning of your procedure, take your Aspirin  81 mg and any morning medicines NOT listed above.  You may use sips of water .  5. Plan to go home the same day, you will only stay overnight if medically necessary. 6. Bring a current list of your medications and current insurance cards. 7. You MUST have a responsible person to drive you home. 8. Someone MUST be with you the first 24 hours after you arrive home or your discharge will be delayed. 9. Please wear clothes that are easy to get on and off and wear slip-on shoes.  Thank you for allowing us  to care for you!   -- [place] [facility]    Follow-Up: At [facility], you and your health needs are our priority.  As part of our continuing mission to provide you with exceptional heart care, we have created designated Provider Care Teams.  These Care Teams include your primary Cardiologist (physician) and Advanced Practice Providers (APPs -  Physician Assistants and Nurse Practitioners) who all work together to provide you with the care you need, when you need it.  We recommend signing up for the patient portal called "MyChart".  Sign up information is provided on this After Visit Summary.  MyChart is used to connect with patients for Virtual Visits (Telemedicine).  Patients are able to view lab/test results, encounter notes, upcoming appointments, etc.  Non-urgent messages can be  sent to your provider as well.   To learn more about what you can do with MyChart, go to [email or web address].    Your next appointment:   1 month(s)  The format for your next appointment:   In Person  Provider:   Ralene Burger, MD   Other Instructions  Coronary Angiogram With Stent Coronary angiogram with stent placement is a procedure to widen or open a narrow blood vessel of the heart (coronary artery). Arteries may become blocked by  cholesterol buildup (plaques) in the lining of the artery wall. When a coronary artery becomes partially blocked, blood flow to that area decreases. This may lead to chest pain or a heart attack (myocardial infarction). A stent is a small piece of metal that looks like mesh or spring. Stent placement may be done as treatment after a heart attack, or to prevent a heart attack if a blocked artery is found by a coronary angiogram. Let your health care provider know about: Any allergies you have, including allergies to medicines or contrast dye. All medicines you are taking, including vitamins, herbs, eye drops, creams, and over-the-counter medicines. Any problems you or family members have had with anesthetic medicines. Any blood disorders you have. Any surgeries you have had. Any medical conditions you have, including kidney problems or kidney failure. Whether you are pregnant or may be pregnant. Whether you are breastfeeding. What are the risks? Generally, this is a safe procedure. However, serious problems may occur, including: Damage to nearby structures or organs, such as the heart, blood vessels, or kidneys. A return of blockage. Bleeding, infection, or bruising at the insertion site. A collection of blood under the skin (hematoma) at the insertion site. A blood clot in another part of the body. Allergic reaction to medicines or dyes. Bleeding into the abdomen (retroperitoneal bleeding). Stroke (rare). Heart attack (rare). What happens before the procedure? Staying hydrated Follow instructions from your health care provider about hydration, which may include: Up to 2 hours before the procedure - you may continue to drink clear liquids, such as water , clear fruit juice, black coffee, and plain tea.    Eating and drinking restrictions Follow instructions from your health care provider about eating and drinking, which may include: 8 hours before the procedure - stop eating heavy meals or  foods, such as meat, fried foods, or fatty foods. 6 hours before the procedure - stop eating light meals or foods, such as toast or cereal. 2 hours before the procedure - stop drinking clear liquids. Medicines Ask your health care provider about: Changing or stopping your regular medicines. This is especially important if you are taking diabetes medicines or blood thinners. Taking medicines such as aspirin  and ibuprofen. These medicines can thin your blood. Do not take these medicines unless your health care provider tells you to take them. Generally, aspirin  is recommended before a thin tube, called a catheter, is passed through a blood vessel and inserted into the heart (cardiac catheterization). Taking over-the-counter medicines, vitamins, herbs, and supplements. General instructions Do not use any products that contain nicotine or tobacco for at least 4 weeks before the procedure. These products include cigarettes, e-cigarettes, and chewing tobacco. If you need help quitting, ask your health care provider. Plan to have someone take you home from the hospital or clinic. If you will be going home right after the procedure, plan to have someone with you for 24 hours. You may have tests and imaging procedures. Ask your health care provider: How your insertion  site will be marked. Ask which artery will be used for the procedure. What steps will be taken to help prevent infection. These may include: Removing hair at the insertion site. Washing skin with a germ-killing soap. Taking antibiotic medicine. What happens during the procedure? An IV will be inserted into one of your veins. Electrodes may be placed on your chest to monitor your heart rate during the procedure. You will be given one or more of the following: A medicine to help you relax (sedative). A medicine to numb the area (local anesthetic) for catheter insertion. A small incision will be made for catheter insertion. The catheter  will be inserted into an artery using a guide wire. The location may be in your groin, your wrist, or the fold of your arm (near your elbow). An X-ray procedure (fluoroscopy) will be used to help guide the catheter to the opening of the heart arteries. A dye will be injected into the catheter. X-rays will be taken. The dye helps to show where any narrowing or blockages are located in the arteries. Tell your health care provider if you have chest pain or trouble breathing. A tiny wire will be guided to the blocked spot, and a balloon will be inflated to make the artery wider. The stent will be expanded to crush the plaques into the wall of the vessel. The stent will hold the area open and improve the blood flow. Most stents have a drug coating to reduce the risk of the stent narrowing over time. The artery may be made wider using a drill, laser, or other tools that remove plaques. The catheter will be removed when the blood flow improves. The stent will stay where it was placed, and the lining of the artery will grow over it. A bandage (dressing) will be placed on the insertion site. Pressure will be applied to stop bleeding. The IV will be removed. This procedure may vary among health care providers and hospitals.    What happens after the procedure? Your blood pressure, heart rate, breathing rate, and blood oxygen  level will be monitored until you leave the hospital or clinic. If the procedure is done through the leg, you will lie flat in bed for a few hours or for as long as told by your health care provider. You will be instructed not to bend or cross your legs. The insertion site and the pulse in your foot or wrist will be checked often. You may have more blood tests, X-rays, and a test that records the electrical activity of your heart (electrocardiogram, or ECG). Do not drive for 24 hours if you were given a sedative during your procedure. Summary Coronary angiogram with stent placement is a  procedure to widen or open a narrowed coronary artery. This is done to treat heart problems. Before the procedure, let your health care provider know about all the medical conditions and surgeries you have or have had. This is a safe procedure. However, some problems may occur, including damage to nearby structures or organs, bleeding, blood clots, or allergies. Follow your health care provider's instructions about eating, drinking, medicines, and other lifestyle changes, such as quitting tobacco use before the procedure. This information is not intended to replace advice given to you by your health care provider. Make sure you discuss any questions you have with your health care provider. Document Revised: 07/23/2018 Document Reviewed: 07/23/2018 Elsevier Patient Education  2021 [employer].

## 2023-06-21 NOTE — Progress Notes (Signed)
 Cardiology Office Note:    Date:  06/21/2023   ID:  Gloria Sharp, DOB [birth year], MRN [id number]  PCP:  Lonie Roa, MD  Cardiologist:  Ralene Burger, MD    Referring MD: Lonie Roa, MD   Chief Complaint  Patient presents with   Shortness of Breath   Hospitalization Follow-up    History of Present Illness:    Gloria Sharp is a 79 y.o. female past medical history significant for coronary artery disease cardiac catheterization done in December 2024 showed 65% stenosis of LAD, 65% stenosis circumflex, 80% stenosis distal RCA.  Decision at the time has been made to pursue medical therapy.  She did have ejection fraction of the time 25%.  Feeling was that the degree of coronary disease did not justify her cardiomyopathy.  She also had aortic stenosis, last assessment of the aortic valve was done in form of echocardiogram which moderate to severe arctic valve stenosis mean gradient 23, dimensional index 0.26, overall aortic valve area 0.86, stroke-volume index was 35.  Comes today to my office she started experiencing exertional tightness in the chest.  In the matter-of-fact she ended up going to the hospital few days ago and she was fine to have normal troponins.  She was given prescription for multiple medication discharge home likely she did not get those medications.  She said within the last 48 hours or so she is doing quite well denies have any chest pain tightness squeezing pressure burning chest.  Previously when she got chest pain that pain was associated with shortness of breath.  Past Medical History:  Diagnosis Date   AKI (acute kidney injury) (HCC) 03/27/2021   Angina pectoris (HCC) 05/15/2022   Aortic stenosis 09/04/2019   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 sciatica 02/12/2022   Class 3 obesity 03/26/2021   CVA (cerebral vascular accident) [facility]) 2015?   Cystocele with uterine prolapse 01/17/2018   Diabetes (HCC)    Dizziness 03/27/2021   Dyslipidemia 10/10/2018    Essential hypertension 10/10/2018   Fibromyalgia    GERD (gastroesophageal reflux disease)    Heart murmur 10/10/2018   History of CVA (cerebrovascular accident) 10/10/2018   Hydronephrosis of right kidney 03/26/2021   Hypercholesterolemia    Hypertension    Hypokalemia 03/27/2021   Leukocytosis    Mitral regurgitation 11/24/2018   Nonsustained ventricular tachycardia (HCC) 11/24/2018   Obesity    Post-operative nausea and vomiting    "hard to wake up"   Rectocele 01/17/2018   Sacroiliitis (HCC) 02/12/2022   Sepsis secondary to UTI (HCC) 03/26/2021   Type 2 diabetes mellitus with hyperglycemia (HCC) 03/26/2021   Type 2 diabetes mellitus without complication, without long-term current use of insulin  (HCC) 10/10/2018    Past Surgical History:  Procedure Laterality Date   ABDOMINAL HYSTERECTOMY     partial, left the ovaries   APPENDECTOMY     BREAST CYST ASPIRATION Right    COLONOSCOPY  11/11/2013   Colonioc polyps status post polypectomy. Pancolonic diverticulosis predominately in the sigmoid colon   CYSTOSCOPY W/ URETERAL STENT PLACEMENT Right 03/26/2021   Procedure: CYSTOSCOPY WITH RETROGRADE PYELOGRAM/URETERAL STENT PLACEMENT;  Surgeon: Lahoma Pigg, MD;  Location: [facility];  Service: Urology;  Laterality: Right;   CYSTOSCOPY/URETEROSCOPY/HOLMIUM LASER/STENT PLACEMENT Right 04/17/2021   Procedure: CYSTOSCOPY/ RETROGRADE/URETEROSCOPY/HOLMIUM LASER/STENT PLACEMENT;  Surgeon: Lahoma Pigg, MD;  Location: [facility];  Service: Urology;  Laterality: Right;  ONLY NEEDS 60 MIN   DILATION AND CURETTAGE OF UTERUS     GALLBLADDER SURGERY  Right 07/2014   HERNIA REPAIR  02/20/2021   Umbilical   LEFT HEART CATH AND CORONARY ANGIOGRAPHY N/A 05/16/2022   Procedure: LEFT HEART CATH AND CORONARY ANGIOGRAPHY;  Surgeon: Wenona Hamilton, MD;  Location: [facility];  Service: Cardiovascular;  Laterality: N/A;   RIGHT/LEFT HEART CATH AND CORONARY ANGIOGRAPHY N/A 08/30/2022   Procedure:  RIGHT/LEFT HEART CATH AND CORONARY ANGIOGRAPHY;  Surgeon: Kyra Phy, MD;  Location: [facility];  Service: Cardiovascular;  Laterality: N/A;   TEE WITHOUT CARDIOVERSION N/A 09/03/2022   Procedure: TRANSESOPHAGEAL ECHOCARDIOGRAM;  Surgeon: Alwin Baars, DO;  Location: [facility];  Service: Cardiovascular;  Laterality: N/A;   vaginal polyp removal      Current Medications: Current Meds  Medication Sig   acetaminophen  (TYLENOL ) 650 MG CR tablet Take 650 mg by mouth daily as needed for pain.   alendronate (FOSAMAX) 35 MG tablet Take 35 mg by mouth every 7 (seven) days.   atorvastatin  (LIPITOR ) 80 MG tablet Take 1 tablet (80 mg total) by mouth daily.   brimonidine  (ALPHAGAN ) 0.15 % ophthalmic solution Place 1 drop into both eyes 2 (two) times daily.   cholestyramine  (QUESTRAN ) 4 g packet Take 1 packet (4 g total) by mouth daily. Take at least 2 hours before or after rest of the medications   clopidogrel  (PLAVIX ) 75 MG tablet Take 1 tablet (75 mg total) by mouth daily.   Cyanocobalamin (VITAMIN B-12 PO) Take 1 tablet by mouth daily in the afternoon.   diclofenac  Sodium (VOLTAREN ) 1 % GEL Apply 2 g topically at bedtime as needed (knee pain).   fenofibrate  (TRICOR ) 145 MG tablet Take 145 mg by mouth daily.   ferrous sulfate 325 (65 FE) MG EC tablet Take 325 mg by mouth daily in the afternoon.   gabapentin  (NEURONTIN ) 300 MG capsule Take 300 mg by mouth 3 (three) times daily.   Infant Care Products [facility]) OINT Apply 1 application  topically 2 (two) times daily as needed (itching).   MAGNESIUM  PO Take 1 tablet by mouth daily in the afternoon.   metFORMIN (GLUCOPHAGE) 1000 MG tablet Take 1,000 mg by mouth 2 (two) times daily with a meal.   metoprolol  succinate (TOPROL  XL) 25 MG 24 hr tablet Take 0.5 tablets (12.5 mg total) by mouth daily.   Omega-3 Fatty Acids (FISH OIL PO) Take 1 capsule by mouth daily in the afternoon.   OVER THE COUNTER MEDICATION Apply 1 patch  topically daily as needed (back pain). Coralite pain patch   pantoprazole  (PROTONIX ) 40 MG tablet Take 40 mg by mouth daily.   sacubitril -valsartan  (ENTRESTO ) 24-26 MG Take 1 tablet by mouth 2 (two) times daily.   sertraline  (ZOLOFT ) 50 MG tablet Take 50 mg by mouth daily.   spironolactone  (ALDACTONE ) 25 MG tablet Take 0.5 tablets (12.5 mg total) by mouth daily.   tiZANidine  (ZANAFLEX ) 2 MG tablet Take 2 mg by mouth every 6 (six) hours as needed for muscle spasms.   TURMERIC PO Take 1 tablet by mouth daily in the afternoon.     Allergies:   Celebrex [celecoxib], Keflex  [place] ], Relafen [nabumetone], Cipro [ciprofloxacin hcl], and Flagyl [metronidazole]   Social History   Socioeconomic History   Marital status: Married    Spouse name: Not on file   Number of children: Not on file   Years of education: Not on file   Highest education level: Not on file  Occupational History   Not on file  Tobacco Use  Smoking status: Never   Smokeless tobacco: Never  Vaping Use   Vaping status: Never Used  Substance and Sexual Activity   Alcohol  use: Not Currently   Drug use: Never   Sexual activity: Not on file  Other Topics Concern   Not on file  Social History Narrative   Not on file   Social Drivers of Health   Financial Resource Strain: Not on file  Food Insecurity: No Food Insecurity (05/15/2022)   Hunger Vital Sign    Worried About Running Out of Food in the Last Year: Never true    Ran Out of Food in the Last Year: Never true  Transportation Needs: No Transportation Needs (05/15/2022)   PRAPARE - Administrator, Civil Service (Medical): No    Lack of Transportation (Non-Medical): No  Physical Activity: Not on file  Stress: Not on file  Social Connections: Not on file     Family History: The patient's family history includes Cancer in her mother; Diabetes in her mother; Heart disease in her father; Heart failure in her mother; Hypertension in her father and  mother; Stroke in her mother. There is no history of Colon cancer, Esophageal cancer, Stomach cancer, Rectal cancer, or Colon polyps. ROS:   Please see the history of present illness.    All 14 point review of systems negative except as described per history of present illness  EKGs/Labs/Other Studies Reviewed:         Recent Labs: 09/05/2022: Magnesium  1.6 02/22/2023: NT-Pro BNP 410 06/19/2023: ALT 22; B Natriuretic Peptide 95.4; BUN 26; Creatinine, Ser 1.05; Hemoglobin 9.1; Platelets 344; Potassium 4.7; Sodium 138  Recent Lipid Panel    Component Value Date/Time   CHOL 127 05/16/2022 0059   TRIG 189 (H) 09/03/2022 0428   HDL 38 (L) 05/16/2022 0059   CHOLHDL 3.3 05/16/2022 0059   VLDL 36 05/16/2022 0059   LDLCALC 53 05/16/2022 0059    Physical Exam:    VS:  BP 118/70 (BP Location: Right Arm, Patient Position: Sitting)   Pulse 88   Ht 5' (1.524 m)   Wt 213 lb 9.6 oz (96.9 kg)   SpO2 98%   BMI 41.72 kg/m     Wt Readings from Last 3 Encounters:  06/21/23 213 lb 9.6 oz (96.9 kg)  04/22/23 211 lb 9.6 oz (96 kg)  02/22/23 212 lb 9.6 oz (96.4 kg)     GEN:  Well nourished, well developed in no acute distress HEENT: Normal NECK: No JVD; No carotid bruits LYMPHATICS: No lymphadenopathy CARDIAC: RRR, ejection murmur grade 2/6 to 3/6 pressure right upper portion of the sternum, S2 is still present but very soft, no rubs, no gallops RESPIRATORY:  Clear to auscultation without rales, wheezing or rhonchi  ABDOMEN: Soft, non-tender, non-distended MUSCULOSKELETAL:  No edema; No deformity  SKIN: Warm and dry LOWER EXTREMITIES: no swelling NEUROLOGIC:  Alert and oriented x 3 PSYCHIATRIC:  Normal affect   ASSESSMENT:    1. Nonrheumatic aortic valve stenosis   2. Chronic systolic congestive heart failure (HCC)   3. Essential hypertension   4. Nonrheumatic mitral valve regurgitation   5. History of CVA (cerebrovascular accident)    PLAN:    In order of problems listed  above:  Nonrheumatic aortic valve stenosis echocardiogram from February reviewed.  It is a borderline severe aortic stenosis, with a dimension index 0.26, aortic valve area less than 1 cm, mean gradient only 28, somewhat a mixed presentation on the echocardiogram.  Plan will  be to do echo today to assess the valve.  I will schedule her to have cardiac catheterization to assess her valve again as well as to do assessment of her coronary arteries.  I asked her not to take nitrates for right now until we Better situation understanding. History of cardiomyopathy.  Will recheck echocardiogram to reassess left ventricle ejection fraction. Nonrheumatic mitral valve regurgitation will verify this again with echocardiogram.  Again she will be scheduled to have a cardiac catheterization procedure explained to her including all risk benefits as well as alternatives.  Will proceed   Medication Adjustments/Labs and Tests Ordered: Current medicines are reviewed at length with the patient today.  Concerns regarding medicines are outlined above.  No orders of the defined types were placed in this encounter.  Medication changes: No orders of the defined types were placed in this encounter.   Signed, Manfred Seed, MD, [facility] 06/21/2023 8:23 AM    [facility]

## 2023-06-24 ENCOUNTER — Other Ambulatory Visit

## 2023-06-24 ENCOUNTER — Encounter: Payer: Self-pay | Admitting: Cardiology

## 2023-06-25 ENCOUNTER — Telehealth: Payer: Self-pay | Admitting: *Deleted

## 2023-06-25 ENCOUNTER — Encounter (INDEPENDENT_AMBULATORY_CARE_PROVIDER_SITE_OTHER): Payer: Self-pay

## 2023-06-25 NOTE — Telephone Encounter (Signed)
 Cardiac Catheterization scheduled at [facility] for: Wednesday June 26, 2023 11:30 AM Arrival time [facility] Main Entrance A at: 9:30 AM  Nothing to eat after midnight prior to procedure, clear liquids until 5 AM day of procedure.  Medication instructions: -Hold  Metformin-day of procedure and 48 hours post procedure  Spironolactone /Entresto -day before and day of procedure-per protocol GFR < 60 (55) pt will hold AM of procedure -Other usual morning medications can be taken with sips of water  including aspirin  81 mg and Plavix  75 mg.  Patient tells me she does not have any active bleeding and has not been told not to take aspirin .  Patient advised she will be given instructions about continuing aspirin  after procedure.  Plan to go home the same day, you will only stay overnight if medically necessary.  You must have responsible adult to drive you home.  Someone must be with you the first 24 hours after you arrive home.  Reviewed procedure instructions with patient.

## 2023-06-26 ENCOUNTER — Encounter (HOSPITAL_COMMUNITY)
Admission: RE | Disposition: A | Discharge status: Home / Self Care | Payer: Self-pay | Source: Home / Self Care | Attending: Cardiology

## 2023-06-26 ENCOUNTER — Other Ambulatory Visit (HOSPITAL_COMMUNITY): Payer: Self-pay

## 2023-06-26 ENCOUNTER — Ambulatory Visit (HOSPITAL_COMMUNITY)
Admission: RE | Admit: 2023-06-26 | Discharge: 2023-06-26 | Disposition: A | Discharge status: Home / Self Care | Attending: Cardiology | Admitting: Cardiology

## 2023-06-26 ENCOUNTER — Other Ambulatory Visit: Payer: Self-pay

## 2023-06-26 DIAGNOSIS — E119 Type 2 diabetes mellitus without complications: Secondary | ICD-10-CM | POA: Diagnosis not present

## 2023-06-26 DIAGNOSIS — I2510 Atherosclerotic heart disease of native coronary artery without angina pectoris: Secondary | ICD-10-CM | POA: Diagnosis not present

## 2023-06-26 DIAGNOSIS — Z79899 Other long term (current) drug therapy: Secondary | ICD-10-CM | POA: Insufficient documentation

## 2023-06-26 DIAGNOSIS — I350 Nonrheumatic aortic (valve) stenosis: Secondary | ICD-10-CM | POA: Insufficient documentation

## 2023-06-26 DIAGNOSIS — I272 Pulmonary hypertension, unspecified: Secondary | ICD-10-CM | POA: Diagnosis not present

## 2023-06-26 DIAGNOSIS — I2584 Coronary atherosclerosis due to calcified coronary lesion: Secondary | ICD-10-CM | POA: Insufficient documentation

## 2023-06-26 DIAGNOSIS — E785 Hyperlipidemia, unspecified: Secondary | ICD-10-CM | POA: Insufficient documentation

## 2023-06-26 DIAGNOSIS — I25119 Atherosclerotic heart disease of native coronary artery with unspecified angina pectoris: Secondary | ICD-10-CM | POA: Insufficient documentation

## 2023-06-26 DIAGNOSIS — Z7984 Long term (current) use of oral hypoglycemic drugs: Secondary | ICD-10-CM | POA: Insufficient documentation

## 2023-06-26 DIAGNOSIS — Z7982 Long term (current) use of aspirin: Secondary | ICD-10-CM | POA: Insufficient documentation

## 2023-06-26 DIAGNOSIS — Z7902 Long term (current) use of antithrombotics/antiplatelets: Secondary | ICD-10-CM | POA: Insufficient documentation

## 2023-06-26 DIAGNOSIS — I209 Angina pectoris, unspecified: Secondary | ICD-10-CM | POA: Diagnosis present

## 2023-06-26 DIAGNOSIS — I10 Essential (primary) hypertension: Secondary | ICD-10-CM | POA: Diagnosis not present

## 2023-06-26 HISTORY — PX: CORONARY BALLOON ANGIOPLASTY: CATH118233

## 2023-06-26 HISTORY — PX: RIGHT/LEFT HEART CATH AND CORONARY ANGIOGRAPHY: CATH118266

## 2023-06-26 LAB — POCT I-STAT EG7
Acid-base deficit: 5 mmol/L — ABNORMAL HIGH (ref 0.0–2.0)
Acid-base deficit: 5 mmol/L — ABNORMAL HIGH (ref 0.0–2.0)
Acid-base deficit: 6 mmol/L — ABNORMAL HIGH (ref 0.0–2.0)
Acid-base deficit: 6 mmol/L — ABNORMAL HIGH (ref 0.0–2.0)
Bicarbonate: 19.6 mmol/L — ABNORMAL LOW (ref 20.0–28.0)
Bicarbonate: 19.9 mmol/L — ABNORMAL LOW (ref 20.0–28.0)
Bicarbonate: 20.3 mmol/L (ref 20.0–28.0)
Bicarbonate: 20.7 mmol/L (ref 20.0–28.0)
Calcium, Ion: 1.28 mmol/L (ref 1.15–1.40)
Calcium, Ion: 1.28 mmol/L (ref 1.15–1.40)
Calcium, Ion: 1.28 mmol/L (ref 1.15–1.40)
Calcium, Ion: 1.29 mmol/L (ref 1.15–1.40)
HCT: 26 % — ABNORMAL LOW (ref 36.0–46.0)
HCT: 26 % — ABNORMAL LOW (ref 36.0–46.0)
HCT: 26 % — ABNORMAL LOW (ref 36.0–46.0)
HCT: 27 % — ABNORMAL LOW (ref 36.0–46.0)
Hemoglobin: 8.8 g/dL — ABNORMAL LOW (ref 12.0–15.0)
Hemoglobin: 8.8 g/dL — ABNORMAL LOW (ref 12.0–15.0)
Hemoglobin: 8.8 g/dL — ABNORMAL LOW (ref 12.0–15.0)
Hemoglobin: 9.2 g/dL — ABNORMAL LOW (ref 12.0–15.0)
O2 Saturation: 61 %
O2 Saturation: 62 %
O2 Saturation: 62 %
O2 Saturation: 67 %
Potassium: 4.4 mmol/L (ref 3.5–5.1)
Potassium: 4.5 mmol/L (ref 3.5–5.1)
Potassium: 4.5 mmol/L (ref 3.5–5.1)
Potassium: 4.5 mmol/L (ref 3.5–5.1)
Sodium: 142 mmol/L (ref 135–145)
Sodium: 142 mmol/L (ref 135–145)
Sodium: 142 mmol/L (ref 135–145)
Sodium: 142 mmol/L (ref 135–145)
TCO2: 21 mmol/L — ABNORMAL LOW (ref 22–32)
TCO2: 21 mmol/L — ABNORMAL LOW (ref 22–32)
TCO2: 22 mmol/L (ref 22–32)
TCO2: 22 mmol/L (ref 22–32)
pCO2, Ven: 39.6 mmHg — ABNORMAL LOW (ref 44–60)
pCO2, Ven: 39.8 mmHg — ABNORMAL LOW (ref 44–60)
pCO2, Ven: 39.8 mmHg — ABNORMAL LOW (ref 44–60)
pCO2, Ven: 40.7 mmHg — ABNORMAL LOW (ref 44–60)
pH, Ven: 7.302 (ref 7.25–7.43)
pH, Ven: 7.306 (ref 7.25–7.43)
pH, Ven: 7.315 (ref 7.25–7.43)
pH, Ven: 7.317 (ref 7.25–7.43)
pO2, Ven: 34 mmHg (ref 32–45)
pO2, Ven: 35 mmHg (ref 32–45)
pO2, Ven: 35 mmHg (ref 32–45)
pO2, Ven: 38 mmHg (ref 32–45)

## 2023-06-26 LAB — GLUCOSE, CAPILLARY
Glucose-Capillary: 102 mg/dL — ABNORMAL HIGH (ref 70–99)
Glucose-Capillary: 92 mg/dL (ref 70–99)

## 2023-06-26 LAB — POCT I-STAT 7, (LYTES, BLD GAS, ICA,H+H)
Acid-base deficit: 7 mmol/L — ABNORMAL HIGH (ref 0.0–2.0)
Bicarbonate: 18.4 mmol/L — ABNORMAL LOW (ref 20.0–28.0)
Calcium, Ion: 1.27 mmol/L (ref 1.15–1.40)
HCT: 26 % — ABNORMAL LOW (ref 36.0–46.0)
Hemoglobin: 8.8 g/dL — ABNORMAL LOW (ref 12.0–15.0)
O2 Saturation: 94 %
Potassium: 4.4 mmol/L (ref 3.5–5.1)
Sodium: 141 mmol/L (ref 135–145)
TCO2: 19 mmol/L — ABNORMAL LOW (ref 22–32)
pCO2 arterial: 35.8 mmHg (ref 32–48)
pH, Arterial: 7.319 — ABNORMAL LOW (ref 7.35–7.45)
pO2, Arterial: 78 mmHg — ABNORMAL LOW (ref 83–108)

## 2023-06-26 LAB — POCT ACTIVATED CLOTTING TIME
Activated Clotting Time: 222 s
Activated Clotting Time: 245 s
Activated Clotting Time: 250 s

## 2023-06-26 SURGERY — RIGHT/LEFT HEART CATH AND CORONARY ANGIOGRAPHY
Anesthesia: LOCAL

## 2023-06-26 MED ORDER — ASPIRIN 81 MG PO CHEW: 81.0000 mg | CHEWABLE_TABLET | ORAL | Status: DC

## 2023-06-26 MED ORDER — MIDAZOLAM HCL 2 MG/2ML IJ SOLN
INTRAMUSCULAR | Status: AC
  Filled 2023-06-26: qty 2

## 2023-06-26 MED ORDER — SODIUM CHLORIDE 0.9% FLUSH: 3.0000 mL | INTRAVENOUS | Status: DC | PRN

## 2023-06-26 MED ORDER — VERAPAMIL HCL 2.5 MG/ML IV SOLN
INTRAVENOUS | Status: AC
  Filled 2023-06-26: qty 2

## 2023-06-26 MED ORDER — NITROGLYCERIN 0.4 MG SL SUBL
0.4000 mg | SUBLINGUAL_TABLET | SUBLINGUAL | 2 refills | Status: DC | PRN
  Filled 2023-06-26: qty 25, 7d supply, fill #0

## 2023-06-26 MED ORDER — VERAPAMIL HCL 2.5 MG/ML IV SOLN
INTRAVENOUS | Status: DC | PRN
  Administered 2023-06-26: 10 mL via INTRA_ARTERIAL

## 2023-06-26 MED ORDER — SODIUM CHLORIDE 0.9 % WEIGHT BASED INFUSION
3.0000 mL/kg/h | INTRAVENOUS | Status: AC
  Administered 2023-06-26: 3 mL/kg/h via INTRAVENOUS

## 2023-06-26 MED ORDER — SODIUM CHLORIDE 0.9 % WEIGHT BASED INFUSION
1.0000 mL/kg/h | INTRAVENOUS | Status: DC
  Administered 2023-06-26: 1 mL/kg/h via INTRAVENOUS

## 2023-06-26 MED ORDER — IOHEXOL 350 MG/ML SOLN
INTRAVENOUS | Status: DC | PRN
  Administered 2023-06-26: 210 mL

## 2023-06-26 MED ORDER — NITROGLYCERIN 1 MG/10 ML FOR IR/CATH LAB
INTRA_ARTERIAL | Status: AC
  Filled 2023-06-26: qty 10

## 2023-06-26 MED ORDER — LIDOCAINE HCL (PF) 1 % IJ SOLN
INTRAMUSCULAR | Status: AC
  Filled 2023-06-26: qty 30

## 2023-06-26 MED ORDER — SODIUM CHLORIDE 0.9% FLUSH: 3.0000 mL | Freq: Two times a day (BID) | INTRAVENOUS | Status: DC

## 2023-06-26 MED ORDER — HEPARIN SODIUM (PORCINE) 1000 UNIT/ML IJ SOLN
INTRAMUSCULAR | Status: AC
  Filled 2023-06-26: qty 10

## 2023-06-26 MED ORDER — SODIUM CHLORIDE 0.9 % IV SOLN: 250.0000 mL | INTRAVENOUS | Status: DC | PRN

## 2023-06-26 MED ORDER — HEPARIN SODIUM (PORCINE) 1000 UNIT/ML IJ SOLN
INTRAMUSCULAR | Status: DC | PRN
Start: 2023-06-26 — End: 2023-06-27
  Administered 2023-06-26 (×2): 3000 [IU] via INTRAVENOUS
  Administered 2023-06-26: 2000 [IU] via INTRAVENOUS
  Administered 2023-06-26: 4000 [IU] via INTRAVENOUS

## 2023-06-26 MED ORDER — ACETAMINOPHEN 325 MG PO TABS: 650.0000 mg | ORAL_TABLET | ORAL | Status: DC | PRN

## 2023-06-26 MED ORDER — HEPARIN (PORCINE) IN NACL 1000-0.9 UT/500ML-% IV SOLN
INTRAVENOUS | Status: DC | PRN
  Administered 2023-06-26 (×2): 500 mL

## 2023-06-26 MED ORDER — FENTANYL CITRATE (PF) 100 MCG/2ML IJ SOLN
INTRAMUSCULAR | Status: AC
Start: 2023-06-26 — End: 2023-06-26
  Filled 2023-06-26: qty 2

## 2023-06-26 MED ORDER — SODIUM CHLORIDE 0.9 % IV SOLN: INTRAVENOUS | Status: DC

## 2023-06-26 MED ORDER — ASPIRIN 81 MG PO CHEW
81.0000 mg | CHEWABLE_TABLET | Freq: Every day | ORAL | 2 refills | Status: AC
  Filled 2023-06-26: qty 90, 90d supply, fill #0

## 2023-06-26 MED ORDER — FENTANYL CITRATE (PF) 100 MCG/2ML IJ SOLN
INTRAMUSCULAR | Status: DC | PRN
  Administered 2023-06-26: 25 ug via INTRAVENOUS

## 2023-06-26 MED ORDER — ASPIRIN 81 MG PO CHEW
81.0000 mg | CHEWABLE_TABLET | Freq: Every day | ORAL | 1 refills | Status: DC
  Filled 2023-06-26: qty 90, 90d supply, fill #0

## 2023-06-26 MED ORDER — ONDANSETRON HCL 4 MG/2ML IJ SOLN: 4.0000 mg | Freq: Four times a day (QID) | INTRAMUSCULAR | Status: DC | PRN

## 2023-06-26 MED ORDER — LIDOCAINE HCL (PF) 1 % IJ SOLN
INTRAMUSCULAR | Status: DC | PRN
  Administered 2023-06-26 (×2): 2 mL

## 2023-06-26 MED ORDER — CLOPIDOGREL BISULFATE 75 MG PO TABS: 75.0000 mg | ORAL_TABLET | ORAL | Status: DC

## 2023-06-26 MED ORDER — MIDAZOLAM HCL 2 MG/2ML IJ SOLN
INTRAMUSCULAR | Status: DC | PRN
  Administered 2023-06-26: 1 mg via INTRAVENOUS

## 2023-06-26 SURGICAL SUPPLY — 18 items
BALLOON SCOREFLEX 2.50X10 (BALLOONS) IMPLANT
CATH INFINITI 5 FR JL3.5 (CATHETERS) IMPLANT
CATH INFINITI AMBI 5FR TG (CATHETERS) IMPLANT
CATH LAUNCHER 5F JL4 (CATHETERS) IMPLANT
CATH LAUNCHER 5F JR4 (CATHETERS) IMPLANT
CATH SWAN GANZ 7F STRAIGHT (CATHETERS) IMPLANT
CATH VISTA GUIDE 6FR XB3.5 EPK (CATHETERS) IMPLANT
DEVICE RAD COMP TR BAND LRG (VASCULAR PRODUCTS) IMPLANT
GLIDESHEATH SLEND SS 6F .021 (SHEATH) IMPLANT
GLIDESHEATH SLENDER 7FR .021G (SHEATH) IMPLANT
GUIDEWIRE .025 260CM (WIRE) IMPLANT
GUIDEWIRE INQWIRE 1.5J.035X260 (WIRE) IMPLANT
KIT ENCORE 26 ADVANTAGE (KITS) IMPLANT
KIT HEMO VALVE WATCHDOG (MISCELLANEOUS) IMPLANT
PACK CARDIAC CATHETERIZATION (CUSTOM PROCEDURE TRAY) ×1 IMPLANT
SET ATX-X65L (MISCELLANEOUS) IMPLANT
STENT SYNERGY XD 3.0X16 (Permanent Stent) IMPLANT
WIRE RUNTHROUGH .014X180CM (WIRE) IMPLANT

## 2023-06-26 NOTE — Discharge Summary (Signed)
 Discharge Summary for Same Day PCI   Patient ID: Gloria Sharp MRN: [id number]; DOB: Jun 01, 1945  Admit date: 06/26/2023 Discharge date: 06/26/2023  Primary Care Provider: Lonie Roa, MD  Primary Cardiologist: Ralene Burger, MD  Primary Electrophysiologist:  None   Discharge Diagnoses    Active Problems:   Hypertension   Diabetes [facility])   Angina pectoris [facility])    Diagnostic Studies/Procedures    Cardiac Catheterization 06/26/2023:  Cardiac Catheterization 06/26/23: Hemodynamic data: Hemodynamic data: LV 172/5, EDP 18 mmHg.  Ao 176/55, mean 96 mmHg.  Peak to peak pressure gradient across the aortic valve at 21.7 with a mean gradient of 22.8 mmHg with calculated aortic valve area of 1.14 cm.   RA 12/10, mean 8 mmHg. RV 45/15, EDP 13 mmHg. PA 47/23, mean 32 mmHg PW 23/22, mean 18 mmHg. CO 6.82, CI 3.58 by Fick.  QP/QS 1.0.  PAPi 2.9.   Angiographic data: LM: Mild calcified but logical vessel. LAD: Large-caliber vessel, gives origin to small D1 and a moderate-sized D2, LAD has mid tandem 40% stenosis, ostial D2 40% stenosis and mid to distal LAD 40% tandem stenoses. LCx: Moderate caliber vessel with ostial 30% stenosis and after the origin of a small OM1 there is a focal 80% stenosis.  Large OM 2. RCA: Mildly diseased in the proximal segment.  Very large caliber vessel, large PDA and large PL branch with a focal mid 90% stenosis.   Intervention data: Successful score flex balloon angioplasty of mid CX with 2.5 x 10 mm balloon stenosis reduced from 80% to 0%.  TIMI-3 to TIMI-3 flow. Successful stenting of the PL branch of RCA with implantation of a 3.0 x 16 mm Synergy XD DES, stenosis reduced from 90% to 0% with TIMI-3 to TIMI-3 flow.  Highly resilient lesion could not get adequate result with balloon angioplasty.      Impression and recommendations: Patient can be discharged home, she did receive 200 mL of contrast, adequate hydration at home will be recommended.  Will  avoid any nephrotoxic agents. _____________   History of Present Illness     Gloria Sharp is a 78 y.o. female with PMH of CAD (catheterization done in December 2024 showed 65% stenosis of LAD, 65% stenosis circumflex, 80% stenosis distal RCA -->treated medically), HTN, HLD who is followed by Dr. Gordan Latina as an outpatient. She also had aortic stenosis, last assessment of the aortic valve was done in form of echocardiogram which moderate to severe arctic valve stenosis mean gradient 23, dimensional index 0.26, overall aortic valve area 0.86, stroke-volume index was 35. She was seen in the office on 6/6 with complaints of chest tightness with a recent ER visit. Given this, she was set up for outpatient cardiac cath.    Hospital Course     The patient underwent cardiac cath as noted above with balloon angioplasty of the left circumflex vessel and DES to PL branch of distal RCA. Plan for DAPT with ASA/Plavi. The patient was seen by cardiac rehab while in short stay. There were no observed complications post cath. Radial cath site was re-evaluated prior to discharge and found to be stable without any complications. Instructions/precautions regarding cath site care were given prior to discharge.  Raj Burdock was seen by Dr. Berry Bristol and determined stable for discharge home. Follow up with our office has been arranged. Medications are listed below. Pertinent changes include addition of aspirin .    _____________  Cath/PCI Registry Performance & Quality Measures: Aspirin  prescribed? - Yes  ADP Receptor Inhibitor (Plavix /Clopidogrel , Brilinta/Ticagrelor or Effient/Prasugrel) prescribed (includes medically managed patients)? - Yes High Intensity Statin (Lipitor  40-80mg  or Crestor 20-40mg ) prescribed? - Yes For EF <40%, was ACEI/ARB prescribed? - Not Applicable (EF >/= 40%) For EF <40%, Aldosterone Antagonist (Spironolactone  or Eplerenone) prescribed? - Not Applicable (EF >/= 40%) Cardiac Rehab Phase II  ordered (Included Medically managed Patients)? - Yes  _____________   Discharge Vitals Blood pressure (!) 123/42, pulse 78, temperature 98.4 F (36.9 C), temperature source Oral, resp. rate 20, height 5' (1.524 m), weight 94.8 kg, SpO2 97%.  Filed Weights   06/26/23 1001  Weight: 94.8 kg    Last Labs & Radiologic Studies    CBC Recent Labs    06/26/23 1422 06/26/23 1426  HGB 9.2*  8.8* 8.8*  HCT 27.0*  26.0* 26.0*   Basic Metabolic Panel Recent Labs    16/10/96 1422 06/26/23 1426  NA 142  142 141  K 4.4  4.5 4.4   Liver Function Tests No results for input(s): AST, ALT, ALKPHOS, BILITOT, PROT, ALBUMIN in the last 72 hours. No results for input(s): LIPASE, AMYLASE in the last 72 hours. High Sensitivity Troponin:   Recent Labs  Lab 06/19/23 1853 06/19/23 2101  TROPONINIHS 16 19*    BNP Invalid input(s): POCBNP D-Dimer No results for input(s): DDIMER in the last 72 hours. Hemoglobin A1C No results for input(s): HGBA1C in the last 72 hours. Fasting Lipid Panel No results for input(s): CHOL, HDL, LDLCALC, TRIG, CHOLHDL, LDLDIRECT in the last 72 hours. Thyroid  Function Tests No results for input(s): TSH, T4TOTAL, T3FREE, THYROIDAB in the last 72 hours.  Invalid input(s): FREET3 _____________  CARDIAC CATHETERIZATION Addendum Date: 06/26/2023 Cardiac Catheterization 06/26/23: Hemodynamic data: Hemodynamic data: LV 172/5, EDP 18 mmHg.  Ao 176/55, mean 96 mmHg.  Peak to peak pressure gradient across the aortic valve at 21.7 with a mean gradient of 22.8 mmHg with calculated aortic valve area of 1.14 cm. RA 12/10, mean 8 mmHg. RV 45/15, EDP 13 mmHg. PA 47/23, mean 32 mmHg PW 23/22, mean 18 mmHg. CO 6.82, CI 3.58 by Fick.  QP/QS 1.0.  PAPi 2.9. Angiographic data: LM: Mild calcified but logical vessel. LAD: Large-caliber vessel, gives origin to small D1 and a moderate-sized D2, LAD has mid tandem 40% stenosis, ostial D2 40%  stenosis and mid to distal LAD 40% tandem stenoses. LCx: Moderate caliber vessel with ostial 30% stenosis and after the origin of a small OM1 there is a focal 80% stenosis.  Large OM 2. RCA: Mildly diseased in the proximal segment.  Very large caliber vessel, large PDA and large PL branch with a focal mid 90% stenosis. Intervention data: Successful score flex balloon angioplasty of mid CX with 2.5 x 10 mm balloon stenosis reduced from 80% to 0%.  TIMI-3 to TIMI-3 flow. Successful stenting of the PL branch of RCA with implantation of a 3.0 x 16 mm Synergy XD DES, stenosis reduced from 90% to 0% with TIMI-3 to TIMI-3 flow.  Highly resilient lesion could not get adequate result with balloon angioplasty. Impression and recommendations: Patient can be discharged home, she did receive 200 mL of contrast, adequate hydration at home will be recommended.  Will avoid any nephrotoxic agents.  Result Date: 06/26/2023 Images from the original result were not included. Cardiac Catheterization 06/26/23: Hemodynamic data: Hemodynamic data: LV 172/5, EDP 18 mmHg.  Ao 176/55, mean 96 mmHg.  Peak to peak pressure gradient across the aortic valve at 21.7 with a mean gradient of 22.8  mmHg with calculated aortic valve area of 1.14 cm. RA 12/10, mean 8 mmHg. RV 45/15, EDP 13 mmHg. PA 47/23, mean 32 mmHg PW 23/22, mean 18 mmHg. CO 6.82, CI 3.58 by Fick.  QP/QS 1.0.  PAPi 2.9. Angiographic data: LM: Mild calcified but logical vessel. LAD: Large-caliber vessel, gives origin to small D1 and a moderate-sized D2, LAD has mid tandem 40% stenosis, ostial D2 40% stenosis and mid to distal LAD 40% tandem stenoses. LCx: Moderate caliber vessel with ostial 30% stenosis and after the origin of a small OM1 there is a focal 80% stenosis.  Large OM 2. RCA: Mildly diseased in the proximal segment.  Very large caliber vessel, large PDA and large PL branch with a focal mid 90% stenosis. Intervention data: Successful score flex balloon angioplasty of mid  CX with 2.5 x 10 mm balloon stenosis reduced from 80% to 0%.  TIMI-3 to TIMI-3 flow. Successful stenting of the PL branch of RCA with implantation of a 3.0 x 16 mm Synergy XD DES, stenosis reduced from 90% to 0% with TIMI-3 to TIMI-3 flow.  Highly resilient lesion could not get adequate result with balloon angioplasty. Impression and recommendations: Patient can be discharged home, she did receive 200 mL of contrast, adequate hydration at home will be recommended.  Will avoid any nephrotoxic agents.   DG Chest Port 1 View Result Date: 06/19/2023 CLINICAL DATA:  Chest pain EXAM: PORTABLE CHEST 1 VIEW COMPARISON:  Chest x-ray 06/17/2023 FINDINGS: The heart size and mediastinal contours are within normal limits. Both lungs are clear. The visualized skeletal structures are unremarkable. IMPRESSION: No active disease. Electronically Signed   By: Tyron Gallon M.D.   On: 06/19/2023 19:25    Disposition   Pt is being discharged home today in good condition.  Follow-up Plans & Appointments     Follow-up Information     Manfred Seed, MD Follow up on 07/22/2023.   Specialty: Cardiology Why: 2:40PM. Cardiology follow up Contact information: [address] [place] [address] [phone]                Discharge Instructions     AMB Referral to Cardiac Rehabilitation - Phase II   Complete by: As directed    Diagnosis: Coronary Stents   After initial evaluation and assessments completed: Virtual Based Care may be provided alone or in conjunction with Phase 2 Cardiac Rehab based on patient barriers.: Yes   Intensive Cardiac Rehabilitation (ICR) [facility] location only OR Traditional Cardiac Rehabilitation (TCR) *If criteria for ICR are not met will enroll in TCR [facility] only): Yes        Discharge Medications   Allergies as of 06/26/2023       Reactions   Celebrex [celecoxib] Itching, Swelling   Swelling throughout body Skin redness    Keflex  [cephalexin ] Itching   09/2018 note:  tolerated Augmentin   Relafen [nabumetone] Itching, Swelling   Cipro [ciprofloxacin Hcl] Hives, Itching, Rash   Flagyl [metronidazole] Itching, Swelling, Rash        Medication List     TAKE these medications    acetaminophen  650 MG CR tablet Commonly known as: TYLENOL  Take 650 mg by mouth daily as needed for pain.   alendronate 35 MG tablet Commonly known as: FOSAMAX Take 35 mg by mouth every 7 (seven) days.   aspirin  81 MG chewable tablet Chew 1 tablet (81 mg total) by mouth daily.   atorvastatin  80 MG tablet Commonly known as: LIPITOR  Take 1 tablet (  80 mg total) by mouth daily.   brimonidine  0.15 % ophthalmic solution Commonly known as: ALPHAGAN  Place 1 drop into both eyes 2 (two) times daily.   cholestyramine  4 g packet Commonly known as: Questran  Take 1 packet (4 g total) by mouth daily. Take at least 2 hours before or after rest of the medications   clopidogrel  75 MG tablet Commonly known as: PLAVIX  Take 1 tablet (75 mg total) by mouth daily.   Dermacloud Oint Apply 1 application  topically 2 (two) times daily as needed (itching).   diclofenac  Sodium 1 % Gel Commonly known as: VOLTAREN  Apply 2 g topically at bedtime as needed (knee pain).   fenofibrate  145 MG tablet Commonly known as: TRICOR  Take 145 mg by mouth daily.   ferrous sulfate 325 (65 FE) MG EC tablet Take 325 mg by mouth daily in the afternoon.   FISH OIL PO Take 1 capsule by mouth daily in the afternoon.   gabapentin  300 MG capsule Commonly known as: NEURONTIN  Take 300 mg by mouth 3 (three) times daily.   MAGNESIUM  PO Take 1 tablet by mouth daily in the afternoon.   metFORMIN 1000 MG tablet Commonly known as: GLUCOPHAGE Take 1,000 mg by mouth 2 (two) times daily with a meal.   metoprolol  succinate 25 MG 24 hr tablet Commonly known as: Toprol  XL Take 0.5 tablets (12.5 mg total) by mouth daily.   nitroGLYCERIN  0.4 MG SL tablet Commonly known as: NITROSTAT  Place 1 tablet (0.4 mg  total) under the tongue every 5 (five) minutes as needed.   OVER THE COUNTER MEDICATION Apply 1 patch topically daily as needed (back pain). Coralite pain patch   pantoprazole  40 MG tablet Commonly known as: PROTONIX  Take 40 mg by mouth daily.   sacubitril -valsartan  24-26 MG Commonly known as: ENTRESTO  Take 1 tablet by mouth 2 (two) times daily.   sertraline  50 MG tablet Commonly known as: ZOLOFT  Take 50 mg by mouth daily.   spironolactone  25 MG tablet Commonly known as: ALDACTONE  Take 0.5 tablets (12.5 mg total) by mouth daily.   tiZANidine  2 MG tablet Commonly known as: ZANAFLEX  Take 2 mg by mouth every 6 (six) hours as needed for muscle spasms.   TURMERIC PO Take 1 tablet by mouth daily in the afternoon.   VITAMIN B-12 PO Take 1 tablet by mouth daily in the afternoon.           Allergies Allergies  Allergen Reactions   Celebrex [Celecoxib] Itching and Swelling    Swelling throughout body Skin redness    Keflex  [Cephalexin ] Itching    09/2018 note: tolerated Augmentin   Relafen [Nabumetone] Itching and Swelling   Cipro [Ciprofloxacin Hcl] Hives, Itching and Rash   Flagyl [Metronidazole] Itching, Swelling and Rash    Outstanding Labs/Studies   N/a   Duration of Discharge Encounter   Greater than 30 minutes including physician time.  Mikael Albright, PA 06/26/2023, 7:25 PM

## 2023-06-26 NOTE — Discharge Instructions (Signed)

## 2023-06-27 ENCOUNTER — Encounter (HOSPITAL_COMMUNITY): Payer: Self-pay | Admitting: Cardiology

## 2023-06-29 ENCOUNTER — Telehealth (HOSPITAL_COMMUNITY): Payer: Self-pay | Admitting: *Deleted

## 2023-06-29 NOTE — Telephone Encounter (Signed)
 CARDIAC REHAB PHASE I   Unable to reach via phone for post stent education. Referral for CRP2 sent to [facility]. Will send written materials to address on file.   Ronny Colas, RN BSN 06/29/2023 10:02 AM

## 2023-07-03 ENCOUNTER — Telehealth: Payer: Self-pay | Admitting: Cardiology

## 2023-07-03 NOTE — Telephone Encounter (Signed)
 Daughter Gloria Sharp) stated she will be dropping off FMLA paperwork to be completed by Dr. Gordan Latina tomorrow.

## 2023-07-05 ENCOUNTER — Encounter: Payer: Self-pay | Admitting: Cardiology

## 2023-07-05 NOTE — Telephone Encounter (Signed)
 Patient's daughter dropped off FMLA forms- patient's daughter informed of forms process and will bring $29 fee in cash or check along with patient signed auth on Monday/kbl 07/05/23

## 2023-07-08 ENCOUNTER — Telehealth: Payer: Self-pay | Admitting: Cardiology

## 2023-07-08 NOTE — Telephone Encounter (Signed)
 Patient's daughter dropping off FMLA forms from [employer] today 07/08/23. $29 fee received in cash and placed in safe. Patient auth and incomplete forms scanned into chart/forms placed in MD box for completion.

## 2023-07-09 ENCOUNTER — Telehealth (HOSPITAL_COMMUNITY): Payer: Self-pay

## 2023-07-09 ENCOUNTER — Encounter (INDEPENDENT_AMBULATORY_CARE_PROVIDER_SITE_OTHER): Payer: Self-pay

## 2023-07-09 NOTE — Telephone Encounter (Signed)
Per phase I cardiac rehab, fax referral to [facility].

## 2023-07-12 ENCOUNTER — Other Ambulatory Visit

## 2023-07-12 ENCOUNTER — Other Ambulatory Visit (HOSPITAL_COMMUNITY): Payer: Self-pay

## 2023-07-12 DIAGNOSIS — I2510 Atherosclerotic heart disease of native coronary artery without angina pectoris: Secondary | ICD-10-CM | POA: Diagnosis not present

## 2023-07-12 DIAGNOSIS — Z6841 Body Mass Index (BMI) 40.0 and over, adult: Secondary | ICD-10-CM | POA: Diagnosis not present

## 2023-07-12 DIAGNOSIS — Z955 Presence of coronary angioplasty implant and graft: Secondary | ICD-10-CM | POA: Diagnosis not present

## 2023-07-12 DIAGNOSIS — R079 Chest pain, unspecified: Secondary | ICD-10-CM | POA: Diagnosis not present

## 2023-07-15 DIAGNOSIS — I152 Hypertension secondary to endocrine disorders: Secondary | ICD-10-CM | POA: Diagnosis not present

## 2023-07-16 DIAGNOSIS — I152 Hypertension secondary to endocrine disorders: Secondary | ICD-10-CM | POA: Diagnosis not present

## 2023-07-16 DIAGNOSIS — E782 Mixed hyperlipidemia: Secondary | ICD-10-CM | POA: Diagnosis not present

## 2023-07-22 ENCOUNTER — Ambulatory Visit: Attending: Cardiology | Admitting: Cardiology

## 2023-07-22 VITALS — BP 142/73 | HR 87 | Ht 60.0 in | Wt 218.0 lb

## 2023-07-22 DIAGNOSIS — I340 Nonrheumatic mitral (valve) insufficiency: Secondary | ICD-10-CM | POA: Diagnosis not present

## 2023-07-22 DIAGNOSIS — I350 Nonrheumatic aortic (valve) stenosis: Secondary | ICD-10-CM

## 2023-07-22 DIAGNOSIS — I209 Angina pectoris, unspecified: Secondary | ICD-10-CM | POA: Diagnosis not present

## 2023-07-22 DIAGNOSIS — D649 Anemia, unspecified: Secondary | ICD-10-CM

## 2023-07-22 DIAGNOSIS — E119 Type 2 diabetes mellitus without complications: Secondary | ICD-10-CM | POA: Diagnosis not present

## 2023-07-22 NOTE — Progress Notes (Signed)
 Cardiology Office Note:    Date:  07/22/2023   ID:  Gloria Sharp, DOB 10-04-1945, MRN [id number]  PCP:  Trinidad Hun, MD  Cardiologist:  Lamar Fitch, MD    Referring MD: Trinidad Hun, MD   Chief Complaint  Patient presents with   Follow-up    Took nitroglycerin  Sat- Chest heavy    History of Present Illness:    Gloria Sharp is a 78 y.o. female past medical history significant for coronary artery disease in 2020 for December she did have cardiac catheterization with multiple borderline lesions she also got aortic stenosis which appears to be moderate to severe.  Recently she started having chest pain and it was difficult to assess if pain is related to her aortic stenosis which is borderline or coronary disease a cardiac catheterization has been performed.  She was found to have significant lesion in the mid circumflex which was fixed as well as PDA.  Since that time she is feeling much better she says she does not have any chest pain.  But for about 2 weeks she was doing great and now she started being a little more active described to have some shortness of breath.  It she thinks it may be related to deconditioning.  Denies have any dizziness or passing out.  I did review her last echocardiogram which showed mean aortic pressure 22.39-dimensional index 0.3 calculi aortic valve area of area 0.78 stroke-volume index 30.8.  Data from cardiac catheterization showed calculated aortic valve area 1.14 cm meter mean gradient 22.8.   Past Medical History:  Diagnosis Date   AKI (acute kidney injury) (HCC) 03/27/2021   Angina pectoris (HCC) 05/15/2022   Aortic stenosis 09/04/2019   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 sciatica 02/12/2022   Class 3 obesity 03/26/2021   CVA (cerebral vascular accident) [facility]) 2015?   Cystocele with uterine prolapse 01/17/2018   Diabetes (HCC)    Dizziness 03/27/2021   Dyslipidemia 10/10/2018   Essential hypertension 10/10/2018   Fibromyalgia    GERD  (gastroesophageal reflux disease)    Heart murmur 10/10/2018   History of CVA (cerebrovascular accident) 10/10/2018   Hydronephrosis of right kidney 03/26/2021   Hypercholesterolemia    Hypertension    Hypokalemia 03/27/2021   Leukocytosis    Mitral regurgitation 11/24/2018   Nonsustained ventricular tachycardia (HCC) 11/24/2018   Obesity    Post-operative nausea and vomiting    hard to wake up   Rectocele 01/17/2018   Sacroiliitis (HCC) 02/12/2022   Sepsis secondary to UTI (HCC) 03/26/2021   Type 2 diabetes mellitus with hyperglycemia (HCC) 03/26/2021   Type 2 diabetes mellitus without complication, without long-term current use of insulin  (HCC) 10/10/2018    Past Surgical History:  Procedure Laterality Date   ABDOMINAL HYSTERECTOMY     partial, left the ovaries   APPENDECTOMY     BREAST CYST ASPIRATION Right    COLONOSCOPY  11/11/2013   Colonioc polyps status post polypectomy. Pancolonic diverticulosis predominately in the sigmoid colon   CORONARY BALLOON ANGIOPLASTY N/A 06/26/2023   Procedure: CORONARY BALLOON ANGIOPLASTY;  Surgeon: Ladona Heinz, MD;  Location: [facility];  Service: Cardiovascular;  Laterality: N/A;   CYSTOSCOPY W/ URETERAL STENT PLACEMENT Right 03/26/2021   Procedure: CYSTOSCOPY WITH RETROGRADE PYELOGRAM/URETERAL STENT PLACEMENT;  Surgeon: Selma Donnice JONELLE, MD;  Location: [facility];  Service: Urology;  Laterality: Right;   CYSTOSCOPY/URETEROSCOPY/HOLMIUM LASER/STENT PLACEMENT Right 04/17/2021   Procedure: CYSTOSCOPY/ RETROGRADE/URETEROSCOPY/HOLMIUM LASER/STENT PLACEMENT;  Surgeon: Selma Donnice JONELLE, MD;  Location:  [facility];  Service: Urology;  Laterality: Right;  ONLY NEEDS 60 MIN   DILATION AND CURETTAGE OF UTERUS     GALLBLADDER SURGERY Right 07/2014   HERNIA REPAIR  02/20/2021   Umbilical   LEFT HEART CATH AND CORONARY ANGIOGRAPHY N/A 05/16/2022   Procedure: LEFT HEART CATH AND CORONARY ANGIOGRAPHY;  Surgeon: Darron Deatrice LABOR, MD;  Location: [facility];  Service: Cardiovascular;  Laterality: N/A;   RIGHT/LEFT HEART CATH AND CORONARY ANGIOGRAPHY N/A 08/30/2022   Procedure: RIGHT/LEFT HEART CATH AND CORONARY ANGIOGRAPHY;  Surgeon: Wendel Lurena POUR, MD;  Location: [facility];  Service: Cardiovascular;  Laterality: N/A;   RIGHT/LEFT HEART CATH AND CORONARY ANGIOGRAPHY N/A 06/26/2023   Procedure: RIGHT/LEFT HEART CATH AND CORONARY ANGIOGRAPHY;  Surgeon: Ladona Heinz, MD;  Location: [facility];  Service: Cardiovascular;  Laterality: N/A;   TEE WITHOUT CARDIOVERSION N/A 09/03/2022   Procedure: TRANSESOPHAGEAL ECHOCARDIOGRAM;  Surgeon: Gardenia Led, DO;  Location: [facility];  Service: Cardiovascular;  Laterality: N/A;   vaginal polyp removal      Current Medications: Current Meds  Medication Sig   acetaminophen  (TYLENOL ) 650 MG CR tablet Take 650 mg by mouth daily as needed for pain.   alendronate (FOSAMAX) 35 MG tablet Take 35 mg by mouth every 7 (seven) days.   aspirin  81 MG chewable tablet Chew 1 tablet (81 mg total) by mouth daily.   atorvastatin  (LIPITOR ) 80 MG tablet Take 1 tablet (80 mg total) by mouth daily.   brimonidine  (ALPHAGAN ) 0.15 % ophthalmic solution Place 1 drop into both eyes 2 (two) times daily.   cholestyramine  (QUESTRAN ) 4 g packet Take 1 packet (4 g total) by mouth daily. Take at least 2 hours before or after rest of the medications   clopidogrel  (PLAVIX ) 75 MG tablet Take 1 tablet (75 mg total) by mouth daily.   Cyanocobalamin (VITAMIN B-12 PO) Take 1 tablet by mouth daily in the afternoon.   diclofenac  Sodium (VOLTAREN ) 1 % GEL Apply 2 g topically at bedtime as needed (knee pain).   fenofibrate  (TRICOR ) 145 MG tablet Take 145 mg by mouth daily.   ferrous sulfate 325 (65 FE) MG EC tablet Take 325 mg by mouth daily in the afternoon.   gabapentin  (NEURONTIN ) 300 MG capsule Take 300 mg by mouth 3 (three) times daily.   Infant Care Products [facility]) OINT Apply 1 application  topically 2 (two)  times daily as needed (itching).   MAGNESIUM  PO Take 1 tablet by mouth daily in the afternoon.   metFORMIN (GLUCOPHAGE) 1000 MG tablet Take 1,000 mg by mouth 2 (two) times daily with a meal.   metoprolol  succinate (TOPROL  XL) 25 MG 24 hr tablet Take 0.5 tablets (12.5 mg total) by mouth daily.   nitroGLYCERIN  (NITROSTAT ) 0.4 MG SL tablet Place 1 tablet (0.4 mg total) under the tongue every 5 (five) minutes as needed.   Omega-3 Fatty Acids (FISH OIL PO) Take 1 capsule by mouth daily in the afternoon.   OVER THE COUNTER MEDICATION Apply 1 patch topically daily as needed (back pain). [place]al[place] pain patch   pantoprazole  (PROTONIX ) 40 MG tablet Take 40 mg by mouth daily.   sacubitril -valsartan  (ENTRESTO ) 24-26 MG Take 1 tablet by mouth 2 (two) times daily.   sertraline  (ZOLOFT ) 50 MG tablet Take 50 mg by mouth daily.   spironolactone  (ALDACTONE ) 25 MG tablet Take 0.5 tablets (12.5 mg total) by mouth daily.   TURMERIC PO Take 1 tablet by mouth daily  in the afternoon.     Allergies:   Celebrex [celecoxib], Keflex  [cephalexin ], Relafen [nabumetone], Cipro [ciprofloxacin hcl], and Flagyl [metronidazole]   Social History   Socioeconomic History   Marital status: Married    Spouse name: Not on file   Number of children: Not on file   Years of education: Not on file   Highest education level: Not on file  Occupational History   Not on file  Tobacco Use   Smoking status: Never   Smokeless tobacco: Never  Vaping Use   Vaping status: Never Used  Substance and Sexual Activity   Alcohol  use: Not Currently   Drug use: Never   Sexual activity: Not on file  Other Topics Concern   Not on file  Social History Narrative   Not on file   Social Drivers of Health   Financial Resource Strain: Not on file  Food Insecurity: No Food Insecurity (05/15/2022)   Hunger Vital Sign    Worried About Running Out of Food in the Last Year: Never true    Ran Out of Food in the Last Year: Never true   Transportation Needs: No Transportation Needs (05/15/2022)   PRAPARE - Administrator, Civil Service (Medical): No    Lack of Transportation (Non-Medical): No  Physical Activity: Not on file  Stress: Not on file  Social Connections: Not on file     Family History: The patient's family history includes Cancer in her mother; Diabetes in her mother; Heart disease in her father; Heart failure in her mother; Hypertension in her father and mother; Stroke in her mother. There is no history of Colon cancer, Esophageal cancer, Stomach cancer, Rectal cancer, or Colon polyps. ROS:   Please see the history of present illness.    All 14 point review of systems negative except as described per history of present illness  EKGs/Labs/Other Studies Reviewed:         Recent Labs: 09/05/2022: Magnesium  1.6 02/22/2023: NT-Pro BNP 410 06/19/2023: ALT 22; B Natriuretic Peptide 95.4; BUN 26; Creatinine, Ser 1.05; Platelets 344 06/26/2023: Hemoglobin 8.8; Potassium 4.4; Sodium 141  Recent Lipid Panel    Component Value Date/Time   CHOL 127 05/16/2022 0059   TRIG 189 (H) 09/03/2022 0428   HDL 38 (L) 05/16/2022 0059   CHOLHDL 3.3 05/16/2022 0059   VLDL 36 05/16/2022 0059   LDLCALC 53 05/16/2022 0059    Physical Exam:    VS:  BP (!) 142/73 (BP Location: Left Arm, Patient Position: Sitting)   Pulse 87   Ht 5' (1.524 m)   Wt 218 lb (98.9 kg)   SpO2 94%   BMI 42.58 kg/m     Wt Readings from Last 3 Encounters:  07/22/23 218 lb (98.9 kg)  06/26/23 209 lb (94.8 kg)  06/21/23 213 lb 9.6 oz (96.9 kg)     GEN:  Well nourished, well developed in no acute distress HEENT: Normal NECK: No JVD; No carotid bruits LYMPHATICS: No lymphadenopathy CARDIAC: RRR, systolic ejection murmur  late peaking, no rubs, no gallops RESPIRATORY:  Clear to auscultation without rales, wheezing or rhonchi  ABDOMEN: Soft, non-tender, non-distended MUSCULOSKELETAL:  No edema; No deformity  SKIN: Warm and dry LOWER  EXTREMITIES: no swelling NEUROLOGIC:  Alert and oriented x 3 PSYCHIATRIC:  Normal affect   ASSESSMENT:    1. Nonrheumatic aortic valve stenosis   2. Angina pectoris (HCC)   3. Nonrheumatic mitral valve regurgitation   4. Type 2 diabetes mellitus without complication, without  long-term current use of insulin  (HCC)    PLAN:    In order of problems listed above:  Nonrheumatic aortic valve stenosis borderline I will ask you to see my colleague Dr. Wonda for evaluation of this complex situation.  I think we at the age of moderate to severe aortic stenosis. Angina pectoris relief after angioplasty but now shortness of breath seems to be leading symptoms could be related to conditioning pressure he was unable to do anything before because of angina after angioplasty Much better. Type 2 diabetes did not follow-up by antimedicine team. Dyslipidemia I did review K PN which show LDL 26 HDL 38   Medication Adjustments/Labs and Tests Ordered: Current medicines are reviewed at length with the patient today.  Concerns regarding medicines are outlined above.  No orders of the defined types were placed in this encounter.  Medication changes: No orders of the defined types were placed in this encounter.   Signed, Lamar DOROTHA Fitch, MD, [facility] 07/22/2023 3:13 PM    [facility]

## 2023-07-22 NOTE — Patient Instructions (Addendum)
 Medication Instructions:  Your physician recommends that you continue on your current medications as directed. Please refer to the Current Medication list given to you today.  *If you need a refill on your cardiac medications before your next appointment, please call your pharmacy*   Lab Work: CBC-today If you have labs (blood work) drawn today and your tests are completely normal, you will receive your results only by: MyChart Message (if you have MyChart) OR A paper copy in the mail If you have any lab test that is abnormal or we need to change your treatment, we will call you to review the results.   Testing/Procedures: None Ordered   Follow-Up: At [facility], you and your health needs are our priority.  As part of our continuing mission to provide you with exceptional heart care, we have created designated Provider Care Teams.  These Care Teams include your primary Cardiologist (physician) and Advanced Practice Providers (APPs -  Physician Assistants and Nurse Practitioners) who all work together to provide you with the care you need, when you need it.  We recommend signing up for the patient portal called MyChart.  Sign up information is provided on this After Visit Summary.  MyChart is used to connect with patients for Virtual Visits (Telemedicine).  Patients are able to view lab/test results, encounter notes, upcoming appointments, etc.  Non-urgent messages can be sent to your provider as well.   To learn more about what you can do with MyChart, go to [email or web address].    Your next appointment:   3 month(s)  The format for your next appointment:   In Person  Provider:   Lamar Fitch, MD    Other Instructions Appt with Dr. Wonda- They will call for appt

## 2023-07-22 NOTE — Telephone Encounter (Signed)
 Upon review, MD Bernie feels this paperwork would be better filled out by PCP. Money (29 dollars cash returned to patient on 07/22/23 at the time of her appointment.)

## 2023-07-23 LAB — CBC
Hematocrit: 28.7 % — ABNORMAL LOW (ref 34.0–46.6)
Hemoglobin: 8.5 g/dL — ABNORMAL LOW (ref 11.1–15.9)
MCH: 27.1 pg (ref 26.6–33.0)
MCHC: 29.6 g/dL — ABNORMAL LOW (ref 31.5–35.7)
MCV: 91 fL (ref 79–97)
Platelets: 389 x10E3/uL (ref 150–450)
RBC: 3.14 x10E6/uL — ABNORMAL LOW (ref 3.77–5.28)
RDW: 13.1 % (ref 11.7–15.4)
WBC: 11.9 x10E3/uL — ABNORMAL HIGH (ref 3.4–10.8)

## 2023-07-26 ENCOUNTER — Ambulatory Visit: Payer: Self-pay | Admitting: Cardiology

## 2023-07-26 ENCOUNTER — Telehealth: Payer: Self-pay

## 2023-07-26 DIAGNOSIS — D649 Anemia, unspecified: Secondary | ICD-10-CM

## 2023-07-26 NOTE — Telephone Encounter (Signed)
  Attempted to reach the pt by phone to discuss structural heart evaluation and the need for repeat echocardiogram in the [facility] system.  The pt's voicemail is full at this time. I will try to reach her again later.       Wonda Sharper, MD  Delores Tinnie ORN, RN Yeah I think an updated echo in our system would be helpful. If we treat her, we might have to put her in [facility]       Previous Messages    ----- Message ----- From: Delores Tinnie ORN, RN Sent: 07/24/2023   5:04 PM EDT To: Sharper Wonda, MD Subject: ? Referral                                    Hey,  I am confused by this referral from Dr Bernie.  Nonrheumatic aortic valve stenosis borderline I will ask you to see my colleague Dr. Wonda for evaluation of this complex situation.  I think we at the age of moderate to severe aortic stenosis.  She had an echo in Feb with mild AS.  An echo was performed at [facility] in June 6th (not always the best quality) and per report mild-mod aortic stenosis.  The cath 6/11 shows: Hemodynamic data: LV 172/5, EDP 18 mmHg.  Ao 176/55, mean 96 mmHg.  Peak to peak pressure gradient across the aortic valve at 21.7 with a mean gradient of 22.8 mmHg with calculated aortic valve area of 1.14 cm.  I can book her to see you but there are discrepancies. I wonder if Bernie would want to do another echo in the [facility] system to look at valve??  Thanks, Tinnie

## 2023-07-30 ENCOUNTER — Other Ambulatory Visit: Payer: Self-pay

## 2023-07-30 DIAGNOSIS — I350 Nonrheumatic aortic (valve) stenosis: Secondary | ICD-10-CM

## 2023-07-30 NOTE — Telephone Encounter (Signed)
 Pt scheduled for echocardiogram on 8/1 and structural heart evaluation with Dr Wonda on 8/18.

## 2023-08-05 ENCOUNTER — Other Ambulatory Visit (HOSPITAL_COMMUNITY): Payer: Self-pay

## 2023-08-06 ENCOUNTER — Telehealth: Payer: Self-pay

## 2023-08-06 NOTE — Telephone Encounter (Signed)
Att x 1. VM full 

## 2023-08-07 ENCOUNTER — Telehealth: Payer: Self-pay

## 2023-08-07 NOTE — Telephone Encounter (Signed)
 Lab Results reviewed with pt as per Dr. Karry note. Pt will come for CBC today.  Pt verbalized understanding and had no additional questions. Routed to PCP

## 2023-08-08 DIAGNOSIS — D649 Anemia, unspecified: Secondary | ICD-10-CM | POA: Diagnosis not present

## 2023-08-09 LAB — CBC
Hematocrit: 26.6 % — ABNORMAL LOW (ref 34.0–46.6)
Hemoglobin: 8 g/dL — ABNORMAL LOW (ref 11.1–15.9)
MCH: 27 pg (ref 26.6–33.0)
MCHC: 30.1 g/dL — ABNORMAL LOW (ref 31.5–35.7)
MCV: 90 fL (ref 79–97)
Platelets: 368 x10E3/uL (ref 150–450)
RBC: 2.96 x10E6/uL — ABNORMAL LOW (ref 3.77–5.28)
RDW: 13.1 % (ref 11.7–15.4)
WBC: 10.9 x10E3/uL — ABNORMAL HIGH (ref 3.4–10.8)

## 2023-08-14 ENCOUNTER — Emergency Department (HOSPITAL_COMMUNITY)
Admission: EM | Admit: 2023-08-14 | Discharge: 2023-08-14 | Disposition: A | Discharge status: Home / Self Care | Attending: Emergency Medicine | Admitting: Emergency Medicine

## 2023-08-14 ENCOUNTER — Emergency Department (HOSPITAL_COMMUNITY)

## 2023-08-14 ENCOUNTER — Encounter (HOSPITAL_COMMUNITY): Payer: Self-pay | Admitting: Emergency Medicine

## 2023-08-14 DIAGNOSIS — Z7982 Long term (current) use of aspirin: Secondary | ICD-10-CM | POA: Diagnosis not present

## 2023-08-14 DIAGNOSIS — E119 Type 2 diabetes mellitus without complications: Secondary | ICD-10-CM | POA: Diagnosis not present

## 2023-08-14 DIAGNOSIS — U071 COVID-19: Secondary | ICD-10-CM | POA: Insufficient documentation

## 2023-08-14 DIAGNOSIS — I700 Atherosclerosis of aorta: Secondary | ICD-10-CM | POA: Diagnosis not present

## 2023-08-14 DIAGNOSIS — Z7984 Long term (current) use of oral hypoglycemic drugs: Secondary | ICD-10-CM | POA: Diagnosis not present

## 2023-08-14 DIAGNOSIS — I509 Heart failure, unspecified: Secondary | ICD-10-CM | POA: Diagnosis not present

## 2023-08-14 DIAGNOSIS — I110 Hypertensive heart disease with heart failure: Secondary | ICD-10-CM | POA: Insufficient documentation

## 2023-08-14 DIAGNOSIS — Z8673 Personal history of transient ischemic attack (TIA), and cerebral infarction without residual deficits: Secondary | ICD-10-CM | POA: Insufficient documentation

## 2023-08-14 DIAGNOSIS — Z794 Long term (current) use of insulin: Secondary | ICD-10-CM | POA: Diagnosis not present

## 2023-08-14 DIAGNOSIS — Z955 Presence of coronary angioplasty implant and graft: Secondary | ICD-10-CM | POA: Diagnosis not present

## 2023-08-14 DIAGNOSIS — Z79899 Other long term (current) drug therapy: Secondary | ICD-10-CM | POA: Insufficient documentation

## 2023-08-14 DIAGNOSIS — J449 Chronic obstructive pulmonary disease, unspecified: Secondary | ICD-10-CM | POA: Insufficient documentation

## 2023-08-14 DIAGNOSIS — R0602 Shortness of breath: Secondary | ICD-10-CM | POA: Diagnosis not present

## 2023-08-14 LAB — RESP PANEL BY RT-PCR (RSV, FLU A&B, COVID)  RVPGX2
Influenza A by PCR: NEGATIVE
Influenza B by PCR: NEGATIVE
Resp Syncytial Virus by PCR: NEGATIVE
SARS Coronavirus 2 by RT PCR: POSITIVE — AB

## 2023-08-14 LAB — BASIC METABOLIC PANEL WITH GFR
Anion gap: 10 (ref 5–15)
BUN: 25 mg/dL — ABNORMAL HIGH (ref 8–23)
CO2: 23 mmol/L (ref 22–32)
Calcium: 9 mg/dL (ref 8.9–10.3)
Chloride: 105 mmol/L (ref 98–111)
Creatinine, Ser: 1 mg/dL (ref 0.44–1.00)
GFR, Estimated: 58 mL/min — ABNORMAL LOW (ref >60)
Glucose, Bld: 95 mg/dL (ref 70–99)
Potassium: 4.4 mmol/L (ref 3.5–5.1)
Sodium: 138 mmol/L (ref 135–145)

## 2023-08-14 LAB — CBC
HCT: 24.1 % — ABNORMAL LOW (ref 36.0–46.0)
Hemoglobin: 7.4 g/dL — ABNORMAL LOW (ref 12.0–15.0)
MCH: 26.4 pg (ref 26.0–34.0)
MCHC: 30.7 g/dL (ref 30.0–36.0)
MCV: 86.1 fL (ref 80.0–100.0)
Platelets: 341 K/uL (ref 150–400)
RBC: 2.8 MIL/uL — ABNORMAL LOW (ref 3.87–5.11)
RDW: 14.2 % (ref 11.5–15.5)
WBC: 9.9 K/uL (ref 4.0–10.5)
nRBC: 0 % (ref 0.0–0.2)

## 2023-08-14 LAB — TROPONIN I (HIGH SENSITIVITY)
Troponin I (High Sensitivity): 16 ng/L (ref <18)
Troponin I (High Sensitivity): 15 ng/L (ref <18)

## 2023-08-14 LAB — D-DIMER, QUANTITATIVE: D-Dimer, Quant: <0.27 ug{FEU}/mL (ref 0.00–0.50)

## 2023-08-14 LAB — BRAIN NATRIURETIC PEPTIDE: B Natriuretic Peptide: 222.8 pg/mL — ABNORMAL HIGH (ref 0.0–100.0)

## 2023-08-14 NOTE — ED Notes (Signed)
 Pt denies cp, dizziness, and N/V  Pt states she has a headache that started this morning. Pt says she usually doesn't get headache.

## 2023-08-14 NOTE — ED Triage Notes (Signed)
 Patient arrives in wheelchair by POV c/o shortness of breath and weakness. States she called her PCP and was told to come here. States she has hx of heart failure but has not noticed any large increase in weight. Denies any sick contacts.

## 2023-08-14 NOTE — ED Notes (Signed)
 Pt noticed the sob get worse with exertion.

## 2023-08-14 NOTE — Discharge Instructions (Addendum)
 Please follow-up with your primary care doctor.  Return to the emergency room if you feel short of breath, or having a harder time walking.

## 2023-08-14 NOTE — ED Provider Triage Note (Signed)
 Emergency Medicine Provider Triage Evaluation Note  Gloria Sharp , a 78 y.o. female  was evaluated in triage.  Pt complains of feeling short of breath when she initially woke up this morning.  Also reports she had some pain in her chest when she was using the restroom.  States that the shortness of breath and chest pain resolved after she has the restroom and she is felt her baseline since then.  Denies any black or bloody stools.  Denies any fever or chills.  Does report mild cough.  Review of Systems  Positive: As above Negative: As above  Physical Exam  BP (!) 127/57 (BP Location: Left Arm)   Pulse 85   Temp 98.3 F (36.8 C)   Resp 18   Ht 5' (1.524 m)   Wt 98 kg   SpO2 96%   BMI 42.18 kg/m  Gen:   Awake, no distress   Resp:  Normal effort  MSK:   Moves extremities without difficulty   Medical Decision Making  Medically screening exam initiated at 1:28 PM.  Appropriate orders placed.  Rosa JONELLE Potters was informed that the remainder of the evaluation will be completed by another provider, this initial triage assessment does not replace that evaluation, and the importance of remaining in the ED until their evaluation is complete.     Veta Palma, PA-C 08/14/23 1328

## 2023-08-14 NOTE — ED Triage Notes (Signed)
 Pt states when she went to bed last night she felt fine. When she woke up this morning she felt weak and sob. Pt states she has been compliant with medications. Pt says she have bad valves that are being monitor.

## 2023-08-14 NOTE — ED Provider Notes (Signed)
 [place] EMERGENCY DEPARTMENT AT [facility] Provider Note  CSN: [id number] Arrival date & time: 08/14/23 1211  Chief Complaint(s) Shortness of Breath and Weakness  HPI Gloria Sharp is a 78 y.o. female who comes to the emergency room today due to shortness of breath that occurred this morning.  She also had some chest pain with that at this time.  Patient has been having increasing dyspnea with exertion.  She had a heart cath on 6/11 with 2 stents placed.  She has a history of moderate to severe aortic stenosis.  She has been having increasing shortness of breath with minor ambulation.  Recently, she has had a bit of a cough and a sore throat.   Past Medical History Past Medical History:  Diagnosis Date   AKI (acute kidney injury) (HCC) 03/27/2021   Angina pectoris (HCC) 05/15/2022   Aortic stenosis 09/04/2019   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 sciatica 02/12/2022   Class 3 obesity 03/26/2021   CVA (cerebral vascular accident) (HCC) 2015?   Cystocele with uterine prolapse 01/17/2018   Diabetes (HCC)    Dizziness 03/27/2021   Dyslipidemia 10/10/2018   Essential hypertension 10/10/2018   Fibromyalgia    GERD (gastroesophageal reflux disease)    Heart murmur 10/10/2018   History of CVA (cerebrovascular accident) 10/10/2018   Hydronephrosis of right kidney 03/26/2021   Hypercholesterolemia    Hypertension    Hypokalemia 03/27/2021   Leukocytosis    Mitral regurgitation 11/24/2018   Nonsustained ventricular tachycardia (HCC) 11/24/2018   Obesity    Post-operative nausea and vomiting    hard to wake up   Rectocele 01/17/2018   Sacroiliitis (HCC) 02/12/2022   Sepsis secondary to UTI (HCC) 03/26/2021   Type 2 diabetes mellitus with hyperglycemia (HCC) 03/26/2021   Type 2 diabetes mellitus without complication, without long-term current use of insulin  (HCC) 10/10/2018   Patient Active Problem List   Diagnosis Date Noted   CHF (congestive heart failure)  (HCC) 09/11/2022   Acute respiratory failure with hypoxia and hypercapnia (HCC) 08/30/2022   Syncope and collapse 08/30/2022   Respiratory arrest (HCC) 08/30/2022   Angina pectoris (HCC) 05/15/2022   Sacroiliitis (HCC) 02/12/2022   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 sciatica 02/12/2022   Hypokalemia 03/27/2021   AKI (acute kidney injury) (HCC) 03/27/2021   Dizziness 03/27/2021   Hydronephrosis of right kidney 03/26/2021   Class 3 obesity 03/26/2021   Type 2 diabetes mellitus with hyperglycemia (HCC) 03/26/2021   Sepsis secondary to UTI (HCC) 03/26/2021   Post-operative nausea and vomiting    Obesity    Leukocytosis    Hypertension    Hypercholesterolemia    GERD (gastroesophageal reflux disease)    Fibromyalgia    Diabetes (HCC)    CVA (cerebral vascular accident) (HCC)    Aortic stenosis 09/04/2019   Mitral regurgitation 11/24/2018   Nonsustained ventricular tachycardia (HCC) 11/24/2018   Heart murmur 10/10/2018   Essential hypertension 10/10/2018   Dyslipidemia 10/10/2018   History of CVA (cerebrovascular accident) 10/10/2018   Type 2 diabetes mellitus without complication, without long-term current use of insulin  (HCC) 10/10/2018   Cystocele with uterine prolapse 01/17/2018   Rectocele 01/17/2018   Home Medication(s) Prior to Admission medications   Medication Sig Start Date End Date Taking? Authorizing Provider  acetaminophen  (TYLENOL ) 650 MG CR tablet Take 650 mg by mouth daily as needed for pain.    [provider]  alendronate (FOSAMAX) 35 MG tablet Take 35 mg by mouth every 7 (  seven) days.    [provider]  aspirin  81 MG chewable tablet Chew 1 tablet (81 mg total) by mouth daily. 06/26/23   Henry Manuelita NOVAK, NP  atorvastatin  (LIPITOR ) 80 MG tablet Take 1 tablet (80 mg total) by mouth daily. 01/31/23   Krasowski, Robert J, MD  brimonidine  (ALPHAGAN ) 0.15 % ophthalmic solution Place 1 drop into both eyes 2 (two) times daily. 08/10/19    [provider]  cholestyramine  (QUESTRAN ) 4 g packet Take 1 packet (4 g total) by mouth daily. Take at least 2 hours before or after rest of the medications 03/25/19   Charlanne Groom, MD  clopidogrel  (PLAVIX ) 75 MG tablet Take 1 tablet (75 mg total) by mouth daily. 05/18/22   Williams, Evan, PA-C  Cyanocobalamin (VITAMIN B-12 PO) Take 1 tablet by mouth daily in the afternoon.    [provider]  diclofenac  Sodium (VOLTAREN ) 1 % GEL Apply 2 g topically at bedtime as needed (knee pain).    [provider]  fenofibrate  (TRICOR ) 145 MG tablet Take 145 mg by mouth daily.    [provider]  ferrous sulfate 325 (65 FE) MG EC tablet Take 325 mg by mouth daily in the afternoon.    [provider]  gabapentin  (NEURONTIN ) 300 MG capsule Take 300 mg by mouth 3 (three) times daily.    [provider]  [employer] [facility]) OINT Apply 1 application  topically 2 (two) times daily as needed (itching).    [provider]  MAGNESIUM  PO Take 1 tablet by mouth daily in the afternoon.    [provider]  metFORMIN (GLUCOPHAGE) 1000 MG tablet Take 1,000 mg by mouth 2 (two) times daily with a meal.    [provider]  metoprolol  succinate (TOPROL  XL) 25 MG 24 hr tablet Take 0.5 tablets (12.5 mg total) by mouth daily. 06/20/23   Theadore Ozell HERO, MD  nitroGLYCERIN  (NITROSTAT ) 0.4 MG SL tablet Place 1 tablet (0.4 mg total) under the tongue every 5 (five) minutes as needed. 06/26/23   Henry Manuelita NOVAK, NP  Omega-3 Fatty Acids (FISH OIL PO) Take 1 capsule by mouth daily in the afternoon.    [provider]  OVER THE COUNTER MEDICATION Apply 1 patch topically daily as needed (back pain). [place]al[place] pain patch    [provider]  pantoprazole  (PROTONIX ) 40 MG tablet Take 40 mg by mouth daily.    [provider]  sacubitril -valsartan  (ENTRESTO ) 24-26 MG Take 1 tablet by mouth 2 (two) times daily. 06/21/23   Krasowski,  Robert J, MD  sertraline  (ZOLOFT ) 50 MG tablet Take 50 mg by mouth daily.    [provider]  spironolactone  (ALDACTONE ) 25 MG tablet Take 0.5 tablets (12.5 mg total) by mouth daily. 03/04/23   Krasowski, Robert J, MD  tiZANidine  (ZANAFLEX ) 2 MG tablet Take 2 mg by mouth every 6 (six) hours as needed for muscle spasms. Patient not taking: Reported on 07/22/2023    [provider]  TURMERIC PO Take 1 tablet by mouth daily in the afternoon.    [provider]  Past Surgical History Past Surgical History:  Procedure Laterality Date   ABDOMINAL HYSTERECTOMY     partial, left the ovaries   APPENDECTOMY     BREAST CYST ASPIRATION Right    COLONOSCOPY  11/11/2013   Colonioc polyps status post polypectomy. Pancolonic diverticulosis predominately in the sigmoid colon   CORONARY BALLOON ANGIOPLASTY N/A 06/26/2023   Procedure: CORONARY BALLOON ANGIOPLASTY;  Surgeon: Ladona Heinz, MD;  Location: [facility];  Service: Cardiovascular;  Laterality: N/A;   CYSTOSCOPY W/ URETERAL STENT PLACEMENT Right 03/26/2021   Procedure: CYSTOSCOPY WITH RETROGRADE PYELOGRAM/URETERAL STENT PLACEMENT;  Surgeon: Selma Donnice SAUNDERS, MD;  Location: [facility];  Service: Urology;  Laterality: Right;   CYSTOSCOPY/URETEROSCOPY/HOLMIUM LASER/STENT PLACEMENT Right 04/17/2021   Procedure: CYSTOSCOPY/ RETROGRADE/URETEROSCOPY/HOLMIUM LASER/STENT PLACEMENT;  Surgeon: Selma Donnice SAUNDERS, MD;  Location: [facility];  Service: Urology;  Laterality: Right;  ONLY NEEDS 60 MIN   DILATION AND CURETTAGE OF UTERUS     GALLBLADDER SURGERY Right 07/2014   HERNIA REPAIR  02/20/2021   Umbilical   LEFT HEART CATH AND CORONARY ANGIOGRAPHY N/A 05/16/2022   Procedure: LEFT HEART CATH AND CORONARY ANGIOGRAPHY;  Surgeon: Darron Deatrice LABOR, MD;  Location: [facility];  Service: Cardiovascular;  Laterality:  N/A;   RIGHT/LEFT HEART CATH AND CORONARY ANGIOGRAPHY N/A 08/30/2022   Procedure: RIGHT/LEFT HEART CATH AND CORONARY ANGIOGRAPHY;  Surgeon: Wendel Lurena POUR, MD;  Location: [facility];  Service: Cardiovascular;  Laterality: N/A;   RIGHT/LEFT HEART CATH AND CORONARY ANGIOGRAPHY N/A 06/26/2023   Procedure: RIGHT/LEFT HEART CATH AND CORONARY ANGIOGRAPHY;  Surgeon: Ladona Heinz, MD;  Location: [facility];  Service: Cardiovascular;  Laterality: N/A;   TEE WITHOUT CARDIOVERSION N/A 09/03/2022   Procedure: TRANSESOPHAGEAL ECHOCARDIOGRAM;  Surgeon: Gardenia Led, DO;  Location: [facility];  Service: Cardiovascular;  Laterality: N/A;   vaginal polyp removal     Family History Family History  Problem Relation Age of Onset   Cancer Mother    Hypertension Mother    Stroke Mother    Diabetes Mother    Heart failure Mother    Hypertension Father    Heart disease Father    Colon cancer Neg Hx    Esophageal cancer Neg Hx    Stomach cancer Neg Hx    Rectal cancer Neg Hx    Colon polyps Neg Hx     Social History Social History   Tobacco Use   Smoking status: Never   Smokeless tobacco: Never  Vaping Use   Vaping status: Never Used  Substance Use Topics   Alcohol  use: Not Currently   Drug use: Never   Allergies Celebrex [celecoxib], Keflex  [cephalexin ], Relafen [nabumetone], Cipro [ciprofloxacin hcl], and Flagyl [metronidazole]  Review of Systems Review of Systems  Physical Exam Vital Signs  I have reviewed the triage vital signs BP (!) 106/48   Pulse 75   Temp 97.7 F (36.5 C) (Oral)   Resp 18   Ht 5' (1.524 m)   Wt 98 kg   SpO2 100%   BMI 42.18 kg/m   Physical Exam Vitals reviewed.  HENT:     Head: Normocephalic and atraumatic.     Mouth/Throat:     Pharynx: No pharyngeal swelling.  Cardiovascular:     Rate and Rhythm: Normal rate.     Heart sounds: Murmur heard.     Comments: Systolic murmur Pulmonary:     Effort: Pulmonary effort is normal.      Breath sounds: No decreased  breath sounds, wheezing or rhonchi.  Musculoskeletal:     Cervical back: Normal range of motion.  Neurological:     General: No focal deficit present.     Mental Status: She is alert.     ED Results and Treatments Labs (all labs ordered are listed, but only abnormal results are displayed) Labs Reviewed  RESP PANEL BY RT-PCR (RSV, FLU A&B, COVID)  RVPGX2 - Abnormal; Notable for the following components:      Result Value   SARS Coronavirus 2 by RT PCR POSITIVE (*)    All other components within normal limits  BASIC METABOLIC PANEL WITH GFR - Abnormal; Notable for the following components:   BUN 25 (*)    GFR, Estimated 58 (*)    All other components within normal limits  CBC - Abnormal; Notable for the following components:   RBC 2.80 (*)    Hemoglobin 7.4 (*)    HCT 24.1 (*)    All other components within normal limits  BRAIN NATRIURETIC PEPTIDE - Abnormal; Notable for the following components:   B Natriuretic Peptide 222.8 (*)    All other components within normal limits  D-DIMER, QUANTITATIVE  TROPONIN I (HIGH SENSITIVITY)  TROPONIN I (HIGH SENSITIVITY)                                                                                                                          Radiology DG Chest 2 View Result Date: 08/14/2023 CLINICAL DATA:  Shortness of breath.  Heart failure. EXAM: CHEST - 2 VIEW COMPARISON:  06/19/2023 FINDINGS: Atherosclerotic calcification of the aortic arch. Thoracic spondylosis. The lungs appear clear. Heart size within normal limits. Mild dextroconvex thoracic scoliosis. No blunting of the costophrenic angles. IMPRESSION: 1. No acute findings. 2. Thoracic spondylosis and mild dextroconvex thoracic scoliosis. Electronically Signed   By: Ryan Salvage M.D.   On: 08/14/2023 12:57    Pertinent labs & imaging results that were available during my care of the patient were reviewed by me and considered in my medical decision  making (see MDM for details).  Medications Ordered in ED Medications - No data to display                                                                                                                                   Procedures Procedures  (including critical care time)  Medical Decision Making / ED  Course   This patient presents to the ED for concern of shortness of breath and chest pain, this involves an extensive number of treatment options, and is a complaint that carries with it a high risk of complications and morbidity.  The differential diagnosis includes structural heart disease, ACS, viral syndrome, pneumonia, heart failure, less likely PE.  MDM: Patient has multiple potential causes for her shortness of breath.  She has known structural heart disease, has a very loud murmur.  This is certainly not helping.  She also has COPD, but her lungs are clear, did not appreciate any wheezing.  Her EKG is without any significant changes from prior.  Her first troponin is negative.  With report of sore throat, have also ordered a COVID swab.  Patient's hemoglobin 7.4, down from 8.06 days ago.  With her COPD and common rheumatic heart disease, her low hemoglobin is probably also not helping her shortness of breath.  Have added a D-dimer and a BNP on the patient.  Reassessment 7:30 PM-patient's D-dimer negative.  Her COVID is positive.  This is likely the cause of her shortness of breath.  She is not requiring any supplemental O2, is appropriate for discharge.  Additional history obtained: -Additional history obtained from husband at bedside -External records from outside source obtained and reviewed including: Chart review including previous notes, labs, imaging, consultation notes   Lab Tests: -I ordered, reviewed, and interpreted labs.   The pertinent results include:   Labs Reviewed  RESP PANEL BY RT-PCR (RSV, FLU A&B, COVID)  RVPGX2 - Abnormal; Notable for the following  components:      Result Value   SARS Coronavirus 2 by RT PCR POSITIVE (*)    All other components within normal limits  BASIC METABOLIC PANEL WITH GFR - Abnormal; Notable for the following components:   BUN 25 (*)    GFR, Estimated 58 (*)    All other components within normal limits  CBC - Abnormal; Notable for the following components:   RBC 2.80 (*)    Hemoglobin 7.4 (*)    HCT 24.1 (*)    All other components within normal limits  BRAIN NATRIURETIC PEPTIDE - Abnormal; Notable for the following components:   B Natriuretic Peptide 222.8 (*)    All other components within normal limits  D-DIMER, QUANTITATIVE  TROPONIN I (HIGH SENSITIVITY)  TROPONIN I (HIGH SENSITIVITY)      EKG left bundle branch block, negative for Sgarbossa criteria.  EKG Interpretation Date/Time:  Wednesday August 14 2023 12:54:45 EDT Ventricular Rate:  80 PR Interval:  142 QRS Duration:  126 QT Interval:  410 QTC Calculation: 472 R Axis:   30  Text Interpretation: Normal sinus rhythm Non-specific intra-ventricular conduction block Cannot rule out Anterior infarct , age undetermined Abnormal ECG When compared with ECG of 26-Jun-2023 15:46, PREVIOUS ECG IS PRESENT Confirmed by Mannie Pac [phone]) on 08/14/2023 5:36:51 PM         Imaging Studies ordered: I ordered imaging studies including chest x-ray I independently visualized and interpreted imaging. I agree with the radiologist interpretation   Medicines ordered and prescription drug management: No orders of the defined types were placed in this encounter.   -I have reviewed the patients home medicines and have made adjustments as needed   Cardiac Monitoring: The patient was maintained on a cardiac monitor.  I personally viewed and interpreted the cardiac monitored which showed an underlying rhythm of: normal sinus rhythm  Social Determinants of Health:  Factors impacting  patients care include: Lack of access to primary  care   Reevaluation: After the interventions noted above, I reevaluated the patient and found that they have :improved  Co morbidities that complicate the patient evaluation  Past Medical History:  Diagnosis Date   AKI (acute kidney injury) (HCC) 03/27/2021   Angina pectoris (HCC) 05/15/2022   Aortic stenosis 09/04/2019   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 sciatica 02/12/2022   Class 3 obesity 03/26/2021   CVA (cerebral vascular accident) [facility]) 2015?   Cystocele with uterine prolapse 01/17/2018   Diabetes (HCC)    Dizziness 03/27/2021   Dyslipidemia 10/10/2018   Essential hypertension 10/10/2018   Fibromyalgia    GERD (gastroesophageal reflux disease)    Heart murmur 10/10/2018   History of CVA (cerebrovascular accident) 10/10/2018   Hydronephrosis of right kidney 03/26/2021   Hypercholesterolemia    Hypertension    Hypokalemia 03/27/2021   Leukocytosis    Mitral regurgitation 11/24/2018   Nonsustained ventricular tachycardia (HCC) 11/24/2018   Obesity    Post-operative nausea and vomiting    hard to wake up   Rectocele 01/17/2018   Sacroiliitis (HCC) 02/12/2022   Sepsis secondary to UTI (HCC) 03/26/2021   Type 2 diabetes mellitus with hyperglycemia (HCC) 03/26/2021   Type 2 diabetes mellitus without complication, without long-term current use of insulin  (HCC) 10/10/2018      Dispostion: I considered admission for this patient, but due to her reassuring vitals I believe she is appropriate for DC.     Final Clinical Impression(s) / ED Diagnoses Final diagnoses:  COVID-19     @PCDICTATION @    Mannie Pac T, DO 08/14/23 1940

## 2023-08-15 DIAGNOSIS — I152 Hypertension secondary to endocrine disorders: Secondary | ICD-10-CM | POA: Diagnosis not present

## 2023-08-16 ENCOUNTER — Ambulatory Visit

## 2023-08-16 DIAGNOSIS — E1159 Type 2 diabetes mellitus with other circulatory complications: Secondary | ICD-10-CM | POA: Diagnosis not present

## 2023-08-16 DIAGNOSIS — E782 Mixed hyperlipidemia: Secondary | ICD-10-CM | POA: Diagnosis not present

## 2023-08-23 ENCOUNTER — Telehealth: Payer: Self-pay

## 2023-08-23 NOTE — Telephone Encounter (Signed)
 LVM for pt to call regarding labs. Review of chart showed HGB in ER on 08/14/23 was 7.4. Advised pt to follow up and to call with any questions.

## 2023-08-25 DIAGNOSIS — M79674 Pain in right toe(s): Secondary | ICD-10-CM | POA: Diagnosis not present

## 2023-08-25 DIAGNOSIS — M25561 Pain in right knee: Secondary | ICD-10-CM | POA: Diagnosis not present

## 2023-08-25 DIAGNOSIS — M797 Fibromyalgia: Secondary | ICD-10-CM | POA: Diagnosis not present

## 2023-08-25 DIAGNOSIS — S7001XA Contusion of right hip, initial encounter: Secondary | ICD-10-CM | POA: Diagnosis not present

## 2023-08-25 DIAGNOSIS — E1159 Type 2 diabetes mellitus with other circulatory complications: Secondary | ICD-10-CM | POA: Diagnosis not present

## 2023-08-25 DIAGNOSIS — M8580 Other specified disorders of bone density and structure, unspecified site: Secondary | ICD-10-CM | POA: Diagnosis not present

## 2023-08-25 DIAGNOSIS — I110 Hypertensive heart disease with heart failure: Secondary | ICD-10-CM | POA: Diagnosis not present

## 2023-08-25 DIAGNOSIS — M25572 Pain in left ankle and joints of left foot: Secondary | ICD-10-CM | POA: Diagnosis not present

## 2023-08-25 DIAGNOSIS — E119 Type 2 diabetes mellitus without complications: Secondary | ICD-10-CM | POA: Diagnosis not present

## 2023-08-25 DIAGNOSIS — M79604 Pain in right leg: Secondary | ICD-10-CM | POA: Diagnosis not present

## 2023-08-25 DIAGNOSIS — I509 Heart failure, unspecified: Secondary | ICD-10-CM | POA: Diagnosis not present

## 2023-08-26 DIAGNOSIS — Z6841 Body Mass Index (BMI) 40.0 and over, adult: Secondary | ICD-10-CM | POA: Diagnosis not present

## 2023-08-26 DIAGNOSIS — W07XXXS Fall from chair, sequela: Secondary | ICD-10-CM | POA: Diagnosis not present

## 2023-08-26 DIAGNOSIS — Z789 Other specified health status: Secondary | ICD-10-CM | POA: Diagnosis not present

## 2023-08-26 DIAGNOSIS — Z7689 Persons encountering health services in other specified circumstances: Secondary | ICD-10-CM | POA: Diagnosis not present

## 2023-09-02 ENCOUNTER — Institutional Professional Consult (permissible substitution): Admitting: Cardiovascular Disease

## 2023-09-02 DIAGNOSIS — M25561 Pain in right knee: Secondary | ICD-10-CM | POA: Diagnosis not present

## 2023-09-02 DIAGNOSIS — M25551 Pain in right hip: Secondary | ICD-10-CM | POA: Diagnosis not present

## 2023-09-02 DIAGNOSIS — R600 Localized edema: Secondary | ICD-10-CM | POA: Diagnosis not present

## 2023-09-02 DIAGNOSIS — M79604 Pain in right leg: Secondary | ICD-10-CM | POA: Diagnosis not present

## 2023-09-03 DIAGNOSIS — M25551 Pain in right hip: Secondary | ICD-10-CM | POA: Diagnosis not present

## 2023-09-03 DIAGNOSIS — M1711 Unilateral primary osteoarthritis, right knee: Secondary | ICD-10-CM | POA: Diagnosis not present

## 2023-09-04 ENCOUNTER — Ambulatory Visit: Attending: Cardiovascular Disease

## 2023-09-04 DIAGNOSIS — I350 Nonrheumatic aortic (valve) stenosis: Secondary | ICD-10-CM | POA: Diagnosis not present

## 2023-09-04 LAB — ECHOCARDIOGRAM COMPLETE
AR max vel: 0.92 cm2
AV Area VTI: 0.93 cm2
AV Area mean vel: 0.87 cm2
AV Mean grad: 25.2 mmHg
AV Peak grad: 41.6 mmHg
Ao pk vel: 3.23 m/s
Area-P 1/2: 3.63 cm2
MV M vel: 5.7 m/s
MV Peak grad: 130 mmHg
MV VTI: 1.43 cm2
Radius: 0.75 cm
S' Lateral: 3.7 cm

## 2023-09-05 ENCOUNTER — Ambulatory Visit: Payer: Self-pay | Admitting: Physician Assistant

## 2023-09-10 ENCOUNTER — Ambulatory Visit: Attending: Cardiovascular Disease | Admitting: Cardiovascular Disease

## 2023-09-10 ENCOUNTER — Encounter: Payer: Self-pay | Admitting: Cardiovascular Disease

## 2023-09-10 VITALS — BP 138/62 | HR 63 | Ht 60.0 in | Wt 218.0 lb

## 2023-09-10 DIAGNOSIS — I350 Nonrheumatic aortic (valve) stenosis: Secondary | ICD-10-CM

## 2023-09-10 DIAGNOSIS — I10 Essential (primary) hypertension: Secondary | ICD-10-CM | POA: Diagnosis not present

## 2023-09-10 NOTE — Progress Notes (Signed)
 Pre Surgical Assessment: 5 M Walk Test  42M=16.78ft  5 Meter Walk Test- trial 1: 12.35 seconds 5 Meter Walk Test- trial 2: 7.57 seconds 5 Meter Walk Test- trial 3: 7.96 seconds 5 Meter Walk Test Average: 9.3 seconds

## 2023-09-10 NOTE — Patient Instructions (Addendum)
 Medication Instructions:  No medication changes were made at this visit. Continue current regimen.   *If you need a refill on your cardiac medications before your next appointment, please call your pharmacy*  Lab Work: None ordered today. If you have labs (blood work) drawn today and your tests are completely normal, you will receive your results only by: MyChart Message (if you have MyChart) OR A paper copy in the mail If you have any lab test that is abnormal or we need to change your treatment, we will call you to review the results.  Testing/Procedures: None ordered today.  Follow-Up: At [facility], you and your health needs are our priority.  As part of our continuing mission to provide you with exceptional heart care, our providers are all part of one team.  This team includes your primary Cardiologist (physician) and Advanced Practice Providers or APPs (Physician Assistants and Nurse Practitioners) who all work together to provide you with the care you need, when you need it.  Your next appointment:   Per Structural Heart Team   We recommend signing up for the patient portal called MyChart.  Sign up information is provided on this After Visit Summary.  MyChart is used to connect with patients for Virtual Visits (Telemedicine).  Patients are able to view lab/test results, encounter notes, upcoming appointments, etc.  Non-urgent messages can be sent to your provider as well.   To learn more about what you can do with MyChart, go to [email or web address].   Other Instructions     Dear Gloria Sharp  You are scheduled for a TEE (Transesophageal Echocardiogram) on Tuesday, September 9 with Dr. Loni.  Please arrive at the [facility] (Main Entrance A) at [facility] [address] [place], [place] [address] at 7:30 AM (This time is 1 hour(s) before your procedure to ensure your preparation). Your procedure is scheduled to begin at 8:30 AM.  Free valet  parking service is available. You will check in at ADMITTING.   *Please Note: You will receive a call the day before your procedure to confirm the appointment time. That time may have changed from the original time based on the schedule for that day.*    DIET:  Nothing to eat or drink after midnight except a sip of water  with medications (see medication instructions below)  Take evening dose of Metformin on 9/8 around 5 PM. HOLD dose of Metformin the morning of your procedure.   MEDICATION INSTRUCTIONS: !!IF ANY NEW MEDICATIONS ARE STARTED AFTER TODAY, PLEASE NOTIFY YOUR PROVIDER AS SOON AS POSSIBLE!!  FYI: Medications such as Semaglutide (Ozempic, [place]), Tirzepatide (Mounjaro, Zepbound), Dulaglutide (Trulicity), etc (GLP1 agonists) AND Canagliflozin (Invokana), Dapagliflozin (Farxiga), Empagliflozin (Jardiance), Ertugliflozin (Steglatro), Bexagliflozin [employer]) or any combination with one of these drugs such as Invokamet (Canagliflozin/Metformin), Synjardy (Empagliflozin/Metformin), etc (SGLT2 inhibitors) must be held around the time of a procedure. This is not a comprehensive list of all of these drugs. Please review all of your medications and talk to your provider if you take any one of these. If you are not sure, ask your provider.     LABS:   Come to today 09/10/23  FYI:  For your safety, and to allow us  to monitor your vital signs accurately during the surgery/procedure we request: If you have artificial nails, gel coating, SNS etc, please have those removed prior to your surgery/procedure. Not having the nail coverings /polish removed may result in cancellation or delay of your surgery/procedure.  Your support  person will be asked to wait in the waiting room during your procedure.  It is OK to have someone drop you off and come back when you are ready to be discharged.  You cannot drive after the procedure and will need someone to drive you home.  Bring your insurance  cards.  *Special Note: Every effort is made to have your procedure done on time. Occasionally there are emergencies that occur at the hospital that may cause delays. Please be patient if a delay does occur.

## 2023-09-10 NOTE — Progress Notes (Signed)
 [facility]   MULTIDISCIPLINARY HEART VALVE TEAM  Date:  09/10/2023   ID:  Gloria Sharp, Gloria 1945/01/29, Gloria Sharp  PCP:  Trinidad Hun, MD   No chief complaint on file.    HISTORY OF PRESENT ILLNESS: Gloria Sharp is a 78 y.o. female who presents for evaluation of aortic stenosis and mitral regurgitation, referred by Dr Bernie  The patient's recent echocardiogram September 04, 2023 shows normal LVEF 60 to 65%, grade 1 diastolic dysfunction, normal RV function, and report severe mitral regurgitation and no mitral stenosis.  No aortic stenosis is reported but the mean transaortic gradient is 25 mmHg and calculated aortic valve area 0.9 cm.  The patient also has coronary artery disease and she underwent right and left heart catheterization in June 2025 showing an mean right atrial pressure of 8 mmHg, mean PA pressure of 32 mmHg, mean wedge pressure of 18 mmHg, and multivessel calcific coronary artery disease.  She underwent scoring balloon angioplasty of the mid circumflex and stenting of the posterolateral branch of the RCA.  She was noted to have moderate diffuse mid LAD stenosis with recommendations for medical therapy.  The patient is here with her husband today. With exertion, she reports a 'heaviness' in her chest. Symptoms resolve after resting for a few minutes. She's had modest improvement following PCI.  She also complains of shortness of breath with activity even with ADL's such as getting dressed.  No orthopnea or PND.  No leg edema.  She does complain of generalized fatigue.  The patient has previously had regular dental care and reports no problems at present. States it has been > 1 year since her last visit.  Past Medical History:  Diagnosis Date   AKI (acute kidney injury) (HCC) 03/27/2021   Angina pectoris (HCC) 05/15/2022   Aortic stenosis 09/04/2019   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 sciatica 02/12/2022   Class 3 obesity 03/26/2021   CVA  (cerebral vascular accident) [facility]) 2015?   Cystocele with uterine prolapse 01/17/2018   Diabetes (HCC)    Dizziness 03/27/2021   Dyslipidemia 10/10/2018   Essential hypertension 10/10/2018   Fibromyalgia    GERD (gastroesophageal reflux disease)    Heart murmur 10/10/2018   History of CVA (cerebrovascular accident) 10/10/2018   Hydronephrosis of right kidney 03/26/2021   Hypercholesterolemia    Hypertension    Hypokalemia 03/27/2021   Leukocytosis    Mitral regurgitation 11/24/2018   Nonsustained ventricular tachycardia (HCC) 11/24/2018   Obesity    Post-operative nausea and vomiting    hard to wake up   Rectocele 01/17/2018   Sacroiliitis (HCC) 02/12/2022   Sepsis secondary to UTI (HCC) 03/26/2021   Type 2 diabetes mellitus with hyperglycemia (HCC) 03/26/2021   Type 2 diabetes mellitus without complication, without long-term current use of insulin  (HCC) 10/10/2018    Current Outpatient Medications  Medication Sig Dispense Refill   acetaminophen  (TYLENOL ) 650 MG CR tablet Take 650 mg by mouth daily as needed for pain.     alendronate (FOSAMAX) 35 MG tablet Take 35 mg by mouth every 7 (seven) days.     aspirin  81 MG chewable tablet Chew 1 tablet (81 mg total) by mouth daily. 90 tablet 2   atorvastatin  (LIPITOR ) 80 MG tablet Take 1 tablet (80 mg total) by mouth daily. 90 tablet 3   brimonidine  (ALPHAGAN ) 0.15 % ophthalmic solution Place 1 drop into both eyes 2 (two) times daily.     cholestyramine  (QUESTRAN ) 4 g packet Take  1 packet (4 g total) by mouth daily. Take at least 2 hours before or after rest of the medications 30 each 11   clopidogrel  (PLAVIX ) 75 MG tablet Take 1 tablet (75 mg total) by mouth daily. 30 tablet 3   Cyanocobalamin  (VITAMIN B-12 PO) Take 1 tablet by mouth daily in the afternoon.     diclofenac  Sodium (VOLTAREN ) 1 % GEL Apply 2 g topically at bedtime as needed (knee pain).     ferrous sulfate  325 (65 FE) MG EC tablet Take 325 mg by mouth daily in the  afternoon.     gabapentin  (NEURONTIN ) 300 MG capsule Take 300 mg by mouth 3 (three) times daily.     HYDROcodone -acetaminophen  (NORCO/VICODIN) 5-325 MG tablet Take 1 tablet by mouth every 4 (four) hours as needed.     Inf[employer] [facility]) OINT Apply 1 application  topically 2 (two) times daily as needed (itching).     MAGNESIUM  PO Take 1 tablet by mouth daily in the afternoon.     metFORMIN (GLUCOPHAGE) 1000 MG tablet Take 1,000 mg by mouth 2 (two) times daily with a meal.     metoprolol  succinate (TOPROL  XL) 25 MG 24 hr tablet Take 0.5 tablets (12.5 mg total) by mouth daily. 45 tablet 3   nitroGLYCERIN  (NITROSTAT ) 0.4 MG SL tablet Place 1 tablet (0.4 mg total) under the tongue every 5 (five) minutes as needed. 25 tablet 2   Omega-3 Fatty Acids (FISH OIL PO) Take 1 capsule by mouth daily in the afternoon.     OVER THE COUNTER MEDICATION Apply 1 patch topically daily as needed (back pain). Coralite pain patch     pantoprazole  (PROTONIX ) 40 MG tablet Take 40 mg by mouth daily.     sacubitril -valsartan  (ENTRESTO ) 24-26 MG Take 1 tablet by mouth 2 (two) times daily. 180 tablet 1   sertraline  (ZOLOFT ) 50 MG tablet Take 50 mg by mouth daily.     spironolactone  (ALDACTONE ) 25 MG tablet Take 0.5 tablets (12.5 mg total) by mouth daily.     tiZANidine  (ZANAFLEX ) 2 MG tablet Take 2 mg by mouth every 6 (six) hours as needed for muscle spasms.     TURMERIC PO Take 1 tablet by mouth daily in the afternoon.     fenofibrate  (TRICOR ) 145 MG tablet Take 145 mg by mouth daily.     No current facility-administered medications for this visit.    ALLERGIES:   Celebrex [celecoxib], Keflex  [cephalexin ], Relafen [nabumetone], Cipro [ciprofloxacin hcl], and Flagyl [metronidazole]   SOCIAL HISTORY:  The patient  reports that she has never smoked. She has never used smokeless tobacco. She reports that she does not currently use alcohol . She reports that she does not use drugs.   FAMILY HISTORY:  The  patient's family history includes Cancer in her mother; Diabetes in her mother; Heart disease in her father; Heart failure in her mother; Hypertension in her father and mother; Stroke in her mother.   REVIEW OF SYSTEMS:  Positive for low back pain, recent fall.   All other systems are reviewed and negative.   PHYSICAL EXAM: VS:  BP 138/62   Pulse 63   Ht 5' (1.524 m)   Wt 218 lb (98.9 kg)   SpO2 98%   BMI 42.58 kg/m  , BMI Body mass index is 42.58 kg/m. GEN: Pleasant, obese woman, in no acute distress HEENT: normal Neck: No JVD. carotids 2+ with bilateral bruits Cardiac: The heart is RRR with 3/6 harsh crescendo decrescendo murmur  at the right upper sternal border, diminished A2. No edema. Pedal pulses 2+ = bilaterally  Respiratory:  clear to auscultation bilaterally GI: soft, nontender, nondistended, + BS MS: no deformity or atrophy Skin: warm and dry, no rash Neuro:  Strength and sensation are intact Psych: euthymic mood, full affect  EKG:  EKG from today reviewed and demonstrates normal sinus rhythm with left bundle branch block, heart rate 72 bpm  RECENT LABS: 02/22/2023: NT-Pro BNP 410 06/19/2023: ALT 22 08/14/2023: B Natriuretic Peptide 222.8; BUN 25; Creatinine, Ser 1.00; Hemoglobin 7.4; Platelets 341; Potassium 4.4; Sodium 138  No results found for requested labs within last 365 days.   CrCl cannot be calculated (Patient's most recent lab result is older than the maximum 21 days allowed.).   Wt Readings from Last 3 Encounters:  09/10/23 218 lb (98.9 kg)  08/14/23 216 lb (98 kg)  07/22/23 218 lb (98.9 kg)     CARDIAC STUDIES: Cardiac Studies & Procedures   ______________________________________________________________________________________________ CARDIAC CATHETERIZATION  CARDIAC CATHETERIZATION 06/26/2023  Conclusion Images from the original result were not included. Cardiac Catheterization 06/26/23: Hemodynamic data: Hemodynamic data: LV 172/5, EDP 18 mmHg.   Ao 176/55, mean 96 mmHg.  Peak to peak pressure gradient across the aortic valve at 21.7 with a mean gradient of 22.8 mmHg with calculated aortic valve area of 1.14 cm.  RA 12/10, mean 8 mmHg. RV 45/15, EDP 13 mmHg. PA 47/23, mean 32 mmHg PW 23/22, mean 18 mmHg. CO 6.82, CI 3.58 by Fick.  QP/QS 1.0.  PAPi 2.9.  Angiographic data: LM: Mild calcified but logical vessel. LAD: Large-caliber vessel, gives origin to small D1 and a moderate-sized D2, LAD has mid tandem 40% stenosis, ostial D2 40% stenosis and mid to distal LAD 40% tandem stenoses. LCx: Moderate caliber vessel with ostial 30% stenosis and after the origin of a small OM1 there is a focal 80% stenosis.  Large OM 2. RCA: Mildly diseased in the proximal segment.  Very large caliber vessel, large PDA and large PL branch with a focal mid 90% stenosis.  Intervention data: Successful score flex balloon angioplasty of mid CX with 2.5 x 10 mm balloon stenosis reduced from 80% to 0%.  TIMI-3 to TIMI-3 flow. Successful stenting of the PL branch of RCA with implantation of a 3.0 x 16 mm Synergy XD DES, stenosis reduced from 90% to 0% with TIMI-3 to TIMI-3 flow.  Highly resilient lesion could not get adequate result with balloon angioplasty.    Impression and recommendations: Patient can be discharged home, she did receive 200 mL of contrast, adequate hydration at home will be recommended.  Will avoid any nephrotoxic agents.  Findings Coronary Findings Diagnostic  Dominance: Right  Left Main Vessel is angiographically normal.  Left Anterior Descending Mid LAD-1 lesion is 40% stenosed. The lesion is mildly calcified. Mid LAD-2 lesion is 60% stenosed. Dist LAD lesion is 40% stenosed.  Second Diagonal Branch 2nd Diag lesion is 40% stenosed.  Third Diagonal Branch Vessel is small in size. Vessel is angiographically normal.  Left Circumflex Ost Cx to Prox Cx lesion is 30% stenosed. Mid Cx lesion is 80% stenosed. The lesion is type  A.  Right Coronary Artery Vessel is large. Prox RCA-1 lesion is 30% stenosed. Prox RCA-2 lesion is 20% stenosed.  Acute Marginal Branch  Right Posterior Atrioventricular Artery RPAV lesion is 90% stenosed. The lesion is type B1 and concentric.  Intervention  Mid Cx lesion Angioplasty Lesion length:  8 mm. CATH VISTA GUIDE 6FR XB3.5  EPK guide catheter was inserted. WIRE RUNTHROUGH .[id number] guidewire used to cross lesion. Scoring balloon angioplasty was performed using a BALLOON SCOREFLEX 2.50X10. Maximum pressure: 16 atm. Inflation time: 60 sec. 3 inflations Post-Intervention Lesion Assessment The intervention was successful. Pre-interventional TIMI flow is 3. Post-intervention TIMI flow is 3. No complications occurred at this lesion. There is a 0% residual stenosis post intervention.  RPAV lesion Stent Lesion length:  14 mm. CATH LAUNCHER 22F JR4 guide catheter was inserted. Lesion crossed with guidewire using a WIRE RUNTHROUGH .[id number]. A drug-eluting stent was successfully placed using a STENT SYNERGY XD 3.0X16. Maximum pressure: 16 atm. Inflation time: 60 sec. Stent strut is well apposed. Post-stent angioplasty was not performed. Stent deployed at 14 atmospheric pressure, for 1 minute followed by reinflation to 16 atmospheric pressure for 60 seconds. Post-Intervention Lesion Assessment The intervention was successful. Pre-interventional TIMI flow is 3. Post-intervention TIMI flow is 3. No complications occurred at this lesion. There is a 0% residual stenosis post intervention.   CARDIAC CATHETERIZATION  CARDIAC CATHETERIZATION 08/30/2022  Conclusion   Mid LAD lesion is 65% stenosed.   Dist Cx lesion is 65% stenosed.   Ost Cx to Prox Cx lesion is 30% stenosed.   Prox RCA-2 lesion is 20% stenosed.   Prox RCA-1 lesion is 30% stenosed.   2nd Diag lesion is 40% stenosed.   RPAV lesion is 80% stenosed.  1.  Unchanged moderate multivessel disease with no acute occlusions. 2.   Fick cardiac output of 7.8 L/min and Fick cardiac index of 4.1 L/min/m with thermodilution cardiac output of 6.3 L/min and thermodilution cardiac index of 3.3 L/min/m with the following hemodynamics: Right atrial pressure mean of 12 mmHg Right ventricular pressure 47/12 with an end-diastolic pressure of 19 mmHg Wedge pressure mean 24 mmHg PA pressure 54/28 with a mean of 40 mmHg Pulmonary vascular resistance of 2 Woods units 3.  LVEDP of 30 mmHg  Recommendation: Continue diuresis and goal-directed medical therapy for acute systolic heart failure.  Results reviewed with spouse.  Findings Coronary Findings Diagnostic  Dominance: Right  Left Main Vessel is angiographically normal.  Left Anterior Descending Mid LAD lesion is 65% stenosed. The lesion is mildly calcified.  Second Diagonal Branch 2nd Diag lesion is 40% stenosed.  Third Diagonal Branch Vessel is small in size. Vessel is angiographically normal.  Left Circumflex Ost Cx to Prox Cx lesion is 30% stenosed. Dist Cx lesion is 65% stenosed.  Right Coronary Artery Vessel is large. Prox RCA-1 lesion is 30% stenosed. Prox RCA-2 lesion is 20% stenosed.  Right Posterior Atrioventricular Artery RPAV lesion is 80% stenosed.  Intervention  No interventions have been documented.   STRESS TESTS  MYOCARDIAL PERFUSION IMAGING 05/14/2022   ECHOCARDIOGRAM  ECHOCARDIOGRAM COMPLETE 09/04/2023  Narrative ECHOCARDIOGRAM REPORT    Patient Name:   Gloria Sharp Date of Exam: 09/04/2023 Medical Rec #:  Sharp     Height:       60.0 in Accession #:    [id number]    Weight:       216.0 lb Date of Birth:  March 16, 1945     BSA:          1.929 m Patient Age:    78 years      BP:           126/81 mmHg Patient Gender: F             HR:           80 bpm. Exam Location:  [facility]  Procedure: 2D Echo, Cardiac Doppler, Color Doppler and Strain Analysis (Both Spectral and Color Flow Doppler were utilized during  procedure).  Indications:    Nonrheumatic aortic valve stenosis [I35.0 (ICD-10-CM)]  History:        Patient has prior history of Echocardiogram examinations, most recent 06/21/2023. Aortic Valve Disease and Mitral Valve Disease; Risk Factors:Diabetes and Dyslipidemia.  Sonographer:    Charlie Jointer RDCS Referring Phys: [phone] Rand Etchison   Sonographer Comments: Image acquisition challenging due to patient body habitus. IMPRESSIONS   1. Left ventricular ejection fraction, by estimation, is 60 to 65%. The left ventricle has normal function. The left ventricle has no regional wall motion abnormalities. Left ventricular diastolic parameters are consistent with Grade I diastolic dysfunction (impaired relaxation). The average left ventricular global longitudinal strain is -9.6 %. The global longitudinal strain is abnormal. 2. Right ventricular systolic function is normal. The right ventricular size is normal. There is mildly elevated pulmonary artery systolic pressure. 3. Left atrial size was moderately dilated. 4. The mitral valve is normal in structure. Severe mitral valve regurgitation. No evidence of mitral stenosis. 5. The aortic valve is normal in structure. Aortic valve regurgitation is not visualized. No aortic stenosis is present. 6. The inferior vena cava is normal in size with greater than 50% respiratory variability, suggesting right atrial pressure of 3 mmHg.  FINDINGS Left Ventricle: Left ventricular ejection fraction, by estimation, is 60 to 65%. The left ventricle has normal function. The left ventricle has no regional wall motion abnormalities. The average left ventricular global longitudinal strain is -9.6 %. Strain was performed and the global longitudinal strain is abnormal. The left ventricular internal cavity size was normal in size. There is no left ventricular hypertrophy. Left ventricular diastolic parameters are consistent with Grade I diastolic dysfunction (impaired  relaxation).  Right Ventricle: The right ventricular size is normal. No increase in right ventricular wall thickness. Right ventricular systolic function is normal. There is mildly elevated pulmonary artery systolic pressure. The tricuspid regurgitant velocity is 3.15 m/s, and with an assumed right atrial pressure of 3 mmHg, the estimated right ventricular systolic pressure is 42.7 mmHg.  Left Atrium: Left atrial size was moderately dilated.  Right Atrium: Right atrial size was normal in size.  Pericardium: There is no evidence of pericardial effusion.  Mitral Valve: The mitral valve is normal in structure. Severe mitral valve regurgitation. No evidence of mitral valve stenosis. MV peak gradient, 12.7 mmHg. The mean mitral valve gradient is 7.0 mmHg.  Tricuspid Valve: The tricuspid valve is normal in structure. Tricuspid valve regurgitation is not demonstrated. No evidence of tricuspid stenosis.  Aortic Valve: The aortic valve is normal in structure. Aortic valve regurgitation is not visualized. No aortic stenosis is present. Aortic valve mean gradient measures 25.2 mmHg. Aortic valve peak gradient measures 41.6 mmHg. Aortic valve area, by VTI measures 0.93 cm.  Pulmonic Valve: The pulmonic valve was normal in structure. Pulmonic valve regurgitation is not visualized. No evidence of pulmonic stenosis.  Aorta: The aortic root is normal in size and structure.  Venous: The inferior vena cava is normal in size with greater than 50% respiratory variability, suggesting right atrial pressure of 3 mmHg.  IAS/Shunts: No atrial level shunt detected by color flow Doppler.   LEFT VENTRICLE PLAX 2D LVIDd:         5.40 cm   Diastology LVIDs:         3.70 cm   LV e' medial:    5.33 cm/s  LV PW:         1.00 cm   LV E/e' medial:  29.5 LV IVS:        1.40 cm   LV e' lateral:   7.62 cm/s LVOT diam:     2.00 cm   LV E/e' lateral: 20.6 LV SV:         66 LV SV Index:   34        2D Longitudinal  Strain LVOT Area:     3.14 cm  2D Strain GLS Avg:     -9.6 %   RIGHT VENTRICLE             IVC RV Basal diam:  4.10 cm     IVC diam: 2.30 cm RV Mid diam:    3.40 cm RV S prime:     16.10 cm/s TAPSE (M-mode): 2.7 cm  LEFT ATRIUM             Index        RIGHT ATRIUM           Index LA diam:        4.30 cm 2.23 cm/m   RA Area:     16.50 cm LA Vol (A2C):   63.3 ml 32.82 ml/m  RA Volume:   48.80 ml  25.30 ml/m LA Vol (A4C):   88.8 ml 46.04 ml/m LA Biplane Vol: 75.6 ml 39.20 ml/m AORTIC VALVE AV Area (Vmax):    0.92 cm AV Area (Vmean):   0.87 cm AV Area (VTI):     0.93 cm AV Vmax:           322.60 cm/s AV Vmean:          236.200 cm/s AV VTI:            0.708 m AV Peak Grad:      41.6 mmHg AV Mean Grad:      25.2 mmHg LVOT Vmax:         94.77 cm/s LVOT Vmean:        65.200 cm/s LVOT VTI:          0.209 m LVOT/AV VTI ratio: 0.29  AORTA Ao Root diam: 3.00 cm Ao Asc diam:  2.90 cm Ao Desc diam: 2.50 cm  MITRAL VALVE                  TRICUSPID VALVE MV Area (PHT): 3.63 cm       TR Peak grad:   39.7 mmHg MV Area VTI:   1.43 cm       TR Vmax:        315.00 cm/s MV Peak grad:  12.7 mmHg MV Mean grad:  7.0 mmHg       SHUNTS MV Vmax:       1.78 m/s       Systemic VTI:  0.21 m MV Vmean:      124.0 cm/s     Systemic Diam: 2.00 cm MV Decel Time: 209 msec MR Peak grad:    130.0 mmHg MR Mean grad:    85.0 mmHg MR Vmax:         570.00 cm/s MR Vmean:        438.0 cm/s MR PISA:         3.53 cm MR PISA Eff ROA: 24 mm MR PISA Radius:  0.75 cm MV E velocity: 157.00 cm/s MV A velocity: 167.00 cm/s MV E/A ratio:  0.94  Jennifer Crape MD  Electronically signed by Jennifer Crape MD Signature Date/Time: 09/04/2023/12:41:11 PM    Final   TEE  ECHO TEE 09/03/2022  Narrative TRANSESOPHOGEAL ECHO REPORT    Patient Name:   Gloria Sharp Date of Exam: 09/03/2022 Medical Rec #:  Sharp     Height:       60.0 in Accession #:    [id number]    Weight:       209.4 lb Date  of Birth:  08-09-1945     BSA:          1.904 m Patient Age:    77 years      BP:           139/54 mmHg Patient Gender: F             HR:           93 bpm. Exam Location:  Inpatient  Procedure: Cardiac Doppler, Color Doppler and Transesophageal Echo  Indications:     AV stenosis  History:         Patient has prior history of Echocardiogram examinations, most recent 08/30/2022.  Sonographer:     Tinnie Gosling RDCS Referring Phys:  [id number] EZRA RAMAN MCLEAN Diagnosing Phys: Ria Commander  PROCEDURE: After discussion of the risks and benefits of a TEE, an informed consent was obtained from the patient. The transesophogeal probe was passed without difficulty through the esophogus of the patient. Sedation performed by different physician. The patient's vital signs; including heart rate, blood pressure, and oxygen  saturation; remained stable throughout the procedure. The patient developed no complications during the procedure.  IMPRESSIONS   1. Left ventricular ejection fraction, by estimation, is 55 to 60%. The left ventricle has normal function. The left ventricle has no regional wall motion abnormalities. 2. Right ventricular systolic function is normal. The right ventricular size is normal. 3. No left atrial/left atrial appendage thrombus was detected. 4. The mitral valve is normal in structure. Trivial mitral valve regurgitation. No evidence of mitral stenosis. 5. The aortic valve is normal in structure. There is mild calcification of the aortic valve. There is mild thickening of the aortic valve. Aortic valve regurgitation is trivial. No aortic stenosis is present.  Conclusion(s)/Recommendation(s): Normal biventricular function without evidence of hemodynamically significant valvular heart disease.  FINDINGS Left Ventricle: Left ventricular ejection fraction, by estimation, is 55 to 60%. The left ventricle has normal function. The left ventricle has no regional wall motion abnormalities.  The left ventricular internal cavity size was normal in size. There is no left ventricular hypertrophy.  Right Ventricle: The right ventricular size is normal. No increase in right ventricular wall thickness. Right ventricular systolic function is normal.  Left Atrium: Left atrial size was normal in size. No left atrial/left atrial appendage thrombus was detected.  Right Atrium: Right atrial size was normal in size.  Pericardium: There is no evidence of pericardial effusion.  Mitral Valve: The mitral valve is normal in structure. Trivial mitral valve regurgitation. No evidence of mitral valve stenosis.  Tricuspid Valve: The tricuspid valve is normal in structure. Tricuspid valve regurgitation is trivial. No evidence of tricuspid stenosis.  Aortic Valve: The aortic valve is normal in structure. There is mild calcification of the aortic valve. There is mild thickening of the aortic valve. Aortic valve regurgitation is trivial. No aortic stenosis is present. Aortic valve mean gradient measures 13.0 mmHg. Aortic valve peak gradient measures 22.1 mmHg.  Pulmonic Valve: The pulmonic valve was normal in structure. Pulmonic valve regurgitation is  not visualized. No evidence of pulmonic stenosis.  Aorta: The aortic root is normal in size and structure.  IAS/Shunts: No atrial level shunt detected by color flow Doppler.   LEFT VENTRICLE PLAX 2D LVOT diam:     1.80 cm LVOT Area:     2.54 cm   AORTIC VALVE AV Vmax:      235.00 cm/s AV Vmean:     168.000 cm/s AV VTI:       0.402 m AV Peak Grad: 22.1 mmHg AV Mean Grad: 13.0 mmHg  AORTA Ao Root diam: 3.00 cm Ao Asc diam:  2.90 cm   SHUNTS Systemic Diam: 1.80 cm  Aditya Sabharwal Electronically signed by Ria Commander Signature Date/Time: 09/24/2022/2:26:09 PM    Final  MONITORS  LONG TERM MONITOR (3-14 DAYS) 11/18/2018  Narrative Gloria Sharp, Gloria Sharp, Gloria Sharp  HOLTER MONITOR REPORT:    Date of test:                  10/18/2018 Duration of test:           13 days Indication:                    Palpitations Ordering physician:  Lamar JINNY Fitch, MD Referring physician:  Lamar JINNY Fitch, MD   Baseline rhythm: Sinus  Minimum heart rate: 58 BPM.  Average heart rate: 85 BPM.  Maximal heart rate 160 BPM.  Atrial arrhythmia: Infrequent PACs, 3 runs of narrow complex tachycardia fastest episode 7 beats at rate of 160.  Ventricular arrhythmia: 1 run of wide-complex tachycardia total of 8 beats at rate of 109.  Asymptomatic.  Conduction abnormality: None  Symptoms: Few triggered events but every single time rhythm was normal.   Conclusion: Nonsustained ventricular tachycardia 8 beats at rates of 109 bpm.  Asymptomatic.  Interpreting  cardiologist: Lamar Fitch, MD Date: 11/23/2018 7:01 PM     CARDIAC MRI  MR CARDIAC MORPHOLOGY W WO CONTRAST 09/03/2022  Narrative CLINICAL DATA:  Cardiomyopathy of uncertain etiology  EXAM: CARDIAC MRI  TECHNIQUE: The patient was scanned on a 1.5 Tesla GE magnet. A dedicated cardiac coil was used. Functional imaging was done using [employer] sequences. 2,3, and 4 chamber views were done to assess for RWMA's. Modified Simpson's rule using a short axis stack was used to calculate an ejection fraction on a dedicated work [employer]. The patient received 8 cc of Gadavist . After 10 minutes inversion recovery sequences were used to assess for infiltration and scar tissue.  FINDINGS: Limited images of the lung fields showed no gross abnormalities.  Normal left ventricular size with mild LV hypertrophy. Septal-lateral dyssynchrony consistent with LBBB, diffuse hypokinesis worse in the inferolateral wall with LV EF 36%. Normal right ventricular size with decreased systolic function, EF 37%. Normal left and right atrial sizes. Trileaflet aortic valve with thickening and moderate restriction, no significant regurgitation. By  regurgitant fraction calculation, there was only trivial MR with regurgitant fraction 4%, but visually mitral regurgitation looked at least mild.  On delayed enhancement imaging, there was >50% wall thickness primarily subendocardial late gadolinium enhancement (LGE) in the basal to mid inferolateral wall.  MEASURMENTS: MEASURMENTS LVEDV 133 mL  LVEDVi 66 mL/m2  LVSV 48 mL  LVEF 36%  RVEDV 79 mL  RVEDVi 39 mL/m2  RVSV 29 mL RVEF 37%  Aortic forward volume 46 mL  Aortic regurgitant fraction 4%  T1 1133, ECV 25%  IMPRESSION: 1. Normal LV size with mild LV hypertrophy.  Diffuse hypokinesis appearing worse in the inferolateral wall, septal-lateral dyssynchrony consistent with LBBB, LV EF calculated to be 36%.  2.  Normal RV size with EF 37%.  3. Thickened and trileaflet aortic valve. Visually moderate aortic stenosis. Would confirm with echo doppler.  4. Visually at least mild mitral regurgitation but regurgitant fraction calculation was only 4%.  5. Delayed enhancement showed subendocardial >50% wall thickness LGE in the basal to mid inferolateral wall. This is a coronary-type pattern and suggests possible prior MI involving LCx territory.  6.  Normal extracellular volume percentage.  Dalton Mclean   Electronically Signed By: Ezra Shuck M.D. 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  I have personally reviewed her echo images which demonstrate significant calcification and restriction of her aortic valve leaflets with peak and mean gradients  of 45 and 29 mmHg, dimensionless index of 0.29, and aortic valve area 0.9 cm.  Her mitral regurgitation by color appears moderately severe (3+) with an EROA of less than 0.3 more suggestive of moderate mitral regurgitation.  Her dominant murmur on exam is 1 of aortic stenosis.  I suspect this is her primary problem.  I also suspect that her anemia has significantly contributed to her symptoms but she appears to have issues with AVMs and she is followed closely by GI. I have reviewed the natural history of aortic stenosis with the patient and their family members who are present today. We have discussed the limitations of medical therapy and the poor prognosis associated with symptomatic aortic stenosis. We have reviewed potential treatment options, including palliative medical therapy, conventional surgical aortic valve replacement, and transcatheter aortic valve replacement. We discussed treatment options in the context of the patient's specific comorbid medical conditions.    For further evaluation, I recommended a transesophageal echocardiogram to better assess the mechanism and severity of her mitral regurgitation.  I reviewed her TEE images from 1 year ago when she had only mild mitral regurgitation.  Her surface echo shows clear worsening of her MR and this may have been related to her volume status at the time.  I also recommended a gated CTA of the heart to evaluate her anatomy for TAVR and to quantitate her aortic valve calcium  score.  Once her studies are completed, her case will be reviewed by our multidisciplinary heart valve team and appropriate follow-up will be arranged.  I have also reviewed her cardiac catheterization study today and the films that are available from her recent interventional procedure.  She is counseled about the natural history of aortic stenosis and mitral regurgitation today.  We reviewed potential treatment options as outlined above.  Even if she required transcatheter  treatment for her mitral valve disease, her valve area may be an issue as her transmitral gradient at baseline was 7 mmHg on her last study.  I suspect her best treatment option will be TAVR for treatment of aortic stenosis and medical therapy for her residual valvular and coronary artery disease.  Informed Consent   Shared Decision Making/Informed Consent   The risks [esophageal damage, perforation (1:10,000 risk), bleeding, pharyngeal hematoma as well as other potential complications associated with conscious sedation including aspiration, arrhythmia, respiratory failure  and death], benefits (treatment guidance and diagnostic support) and alternatives of a transesophageal echocardiogram were discussed in detail with Gloria Sharp and she is willing to proceed.        Gloria Sharp 09/10/2023 11:31 AM      [phone] (office) [phone] (fax)

## 2023-09-11 ENCOUNTER — Telehealth: Payer: Self-pay | Admitting: Student

## 2023-09-11 ENCOUNTER — Other Ambulatory Visit: Payer: Self-pay

## 2023-09-11 ENCOUNTER — Emergency Department (HOSPITAL_COMMUNITY)

## 2023-09-11 ENCOUNTER — Inpatient Hospital Stay (HOSPITAL_COMMUNITY)
Admission: EM | Admit: 2023-09-11 | Discharge: 2023-09-13 | DRG: 378 | Disposition: A | Discharge status: Home / Self Care | Source: Ambulatory Visit

## 2023-09-11 DIAGNOSIS — E538 Deficiency of other specified B group vitamins: Secondary | ICD-10-CM | POA: Diagnosis present

## 2023-09-11 DIAGNOSIS — I5030 Unspecified diastolic (congestive) heart failure: Secondary | ICD-10-CM | POA: Diagnosis not present

## 2023-09-11 DIAGNOSIS — M25551 Pain in right hip: Secondary | ICD-10-CM | POA: Diagnosis present

## 2023-09-11 DIAGNOSIS — Z7983 Long term (current) use of bisphosphonates: Secondary | ICD-10-CM

## 2023-09-11 DIAGNOSIS — Z833 Family history of diabetes mellitus: Secondary | ICD-10-CM | POA: Diagnosis not present

## 2023-09-11 DIAGNOSIS — D62 Acute posthemorrhagic anemia: Secondary | ICD-10-CM | POA: Diagnosis present

## 2023-09-11 DIAGNOSIS — Z955 Presence of coronary angioplasty implant and graft: Secondary | ICD-10-CM

## 2023-09-11 DIAGNOSIS — Z881 Allergy status to other antibiotic agents status: Secondary | ICD-10-CM | POA: Diagnosis not present

## 2023-09-11 DIAGNOSIS — I5022 Chronic systolic (congestive) heart failure: Secondary | ICD-10-CM | POA: Diagnosis not present

## 2023-09-11 DIAGNOSIS — E1165 Type 2 diabetes mellitus with hyperglycemia: Secondary | ICD-10-CM | POA: Diagnosis present

## 2023-09-11 DIAGNOSIS — M5416 Radiculopathy, lumbar region: Secondary | ICD-10-CM | POA: Diagnosis not present

## 2023-09-11 DIAGNOSIS — I10 Essential (primary) hypertension: Secondary | ICD-10-CM | POA: Diagnosis not present

## 2023-09-11 DIAGNOSIS — D509 Iron deficiency anemia, unspecified: Secondary | ICD-10-CM

## 2023-09-11 DIAGNOSIS — G8929 Other chronic pain: Secondary | ICD-10-CM | POA: Diagnosis present

## 2023-09-11 DIAGNOSIS — R531 Weakness: Secondary | ICD-10-CM | POA: Diagnosis not present

## 2023-09-11 DIAGNOSIS — I350 Nonrheumatic aortic (valve) stenosis: Secondary | ICD-10-CM | POA: Diagnosis not present

## 2023-09-11 DIAGNOSIS — Z90710 Acquired absence of both cervix and uterus: Secondary | ICD-10-CM

## 2023-09-11 DIAGNOSIS — M797 Fibromyalgia: Secondary | ICD-10-CM | POA: Diagnosis present

## 2023-09-11 DIAGNOSIS — E7800 Pure hypercholesterolemia, unspecified: Secondary | ICD-10-CM | POA: Diagnosis present

## 2023-09-11 DIAGNOSIS — D500 Iron deficiency anemia secondary to blood loss (chronic): Secondary | ICD-10-CM

## 2023-09-11 DIAGNOSIS — Z87440 Personal history of urinary (tract) infections: Secondary | ICD-10-CM

## 2023-09-11 DIAGNOSIS — D649 Anemia, unspecified: Secondary | ICD-10-CM | POA: Diagnosis not present

## 2023-09-11 DIAGNOSIS — Z7982 Long term (current) use of aspirin: Secondary | ICD-10-CM

## 2023-09-11 DIAGNOSIS — E1141 Type 2 diabetes mellitus with diabetic mononeuropathy: Secondary | ICD-10-CM | POA: Diagnosis present

## 2023-09-11 DIAGNOSIS — K317 Polyp of stomach and duodenum: Secondary | ICD-10-CM | POA: Diagnosis present

## 2023-09-11 DIAGNOSIS — Z6841 Body Mass Index (BMI) 40.0 and over, adult: Secondary | ICD-10-CM | POA: Diagnosis not present

## 2023-09-11 DIAGNOSIS — Z9049 Acquired absence of other specified parts of digestive tract: Secondary | ICD-10-CM

## 2023-09-11 DIAGNOSIS — I340 Nonrheumatic mitral (valve) insufficiency: Secondary | ICD-10-CM | POA: Diagnosis not present

## 2023-09-11 DIAGNOSIS — E119 Type 2 diabetes mellitus without complications: Secondary | ICD-10-CM

## 2023-09-11 DIAGNOSIS — I517 Cardiomegaly: Secondary | ICD-10-CM | POA: Diagnosis not present

## 2023-09-11 DIAGNOSIS — K3189 Other diseases of stomach and duodenum: Secondary | ICD-10-CM | POA: Diagnosis not present

## 2023-09-11 DIAGNOSIS — K31811 Angiodysplasia of stomach and duodenum with bleeding: Principal | ICD-10-CM | POA: Diagnosis present

## 2023-09-11 DIAGNOSIS — I447 Left bundle-branch block, unspecified: Secondary | ICD-10-CM | POA: Diagnosis present

## 2023-09-11 DIAGNOSIS — E66813 Obesity, class 3: Secondary | ICD-10-CM | POA: Diagnosis present

## 2023-09-11 DIAGNOSIS — K31819 Angiodysplasia of stomach and duodenum without bleeding: Secondary | ICD-10-CM | POA: Diagnosis not present

## 2023-09-11 DIAGNOSIS — F419 Anxiety disorder, unspecified: Secondary | ICD-10-CM | POA: Diagnosis present

## 2023-09-11 DIAGNOSIS — Z79899 Other long term (current) drug therapy: Secondary | ICD-10-CM | POA: Diagnosis not present

## 2023-09-11 DIAGNOSIS — I110 Hypertensive heart disease with heart failure: Secondary | ICD-10-CM | POA: Diagnosis present

## 2023-09-11 DIAGNOSIS — K922 Gastrointestinal hemorrhage, unspecified: Secondary | ICD-10-CM

## 2023-09-11 DIAGNOSIS — Z8249 Family history of ischemic heart disease and other diseases of the circulatory system: Secondary | ICD-10-CM

## 2023-09-11 DIAGNOSIS — M545 Low back pain, unspecified: Secondary | ICD-10-CM | POA: Diagnosis present

## 2023-09-11 DIAGNOSIS — Z886 Allergy status to analgesic agent status: Secondary | ICD-10-CM

## 2023-09-11 DIAGNOSIS — Z01818 Encounter for other preprocedural examination: Secondary | ICD-10-CM | POA: Diagnosis not present

## 2023-09-11 DIAGNOSIS — Z8673 Personal history of transient ischemic attack (TIA), and cerebral infarction without residual deficits: Secondary | ICD-10-CM

## 2023-09-11 DIAGNOSIS — Z7902 Long term (current) use of antithrombotics/antiplatelets: Secondary | ICD-10-CM | POA: Diagnosis not present

## 2023-09-11 DIAGNOSIS — Z7984 Long term (current) use of oral hypoglycemic drugs: Secondary | ICD-10-CM

## 2023-09-11 DIAGNOSIS — Z9181 History of falling: Secondary | ICD-10-CM

## 2023-09-11 DIAGNOSIS — I080 Rheumatic disorders of both mitral and aortic valves: Secondary | ICD-10-CM | POA: Diagnosis not present

## 2023-09-11 DIAGNOSIS — D131 Benign neoplasm of stomach: Secondary | ICD-10-CM | POA: Diagnosis not present

## 2023-09-11 DIAGNOSIS — F32A Depression, unspecified: Secondary | ICD-10-CM | POA: Diagnosis present

## 2023-09-11 DIAGNOSIS — I2510 Atherosclerotic heart disease of native coronary artery without angina pectoris: Secondary | ICD-10-CM

## 2023-09-11 DIAGNOSIS — R42 Dizziness and giddiness: Secondary | ICD-10-CM

## 2023-09-11 DIAGNOSIS — J40 Bronchitis, not specified as acute or chronic: Secondary | ICD-10-CM | POA: Diagnosis not present

## 2023-09-11 DIAGNOSIS — I5020 Unspecified systolic (congestive) heart failure: Secondary | ICD-10-CM | POA: Insufficient documentation

## 2023-09-11 DIAGNOSIS — Z8619 Personal history of other infectious and parasitic diseases: Secondary | ICD-10-CM

## 2023-09-11 DIAGNOSIS — R011 Cardiac murmur, unspecified: Secondary | ICD-10-CM | POA: Diagnosis present

## 2023-09-11 DIAGNOSIS — Z823 Family history of stroke: Secondary | ICD-10-CM

## 2023-09-11 DIAGNOSIS — K219 Gastro-esophageal reflux disease without esophagitis: Secondary | ICD-10-CM | POA: Diagnosis present

## 2023-09-11 LAB — CBC WITH DIFFERENTIAL/PLATELET
Abs Granulocyte: 6 K/uL (ref 1.5–6.5)
Abs Immature Granulocytes: 0.05 K/uL (ref 0.00–0.07)
Basophils Absolute: 0 K/uL (ref 0.0–0.1)
Basophils Relative: 1 %
Eosinophils Absolute: 0.3 K/uL (ref 0.0–0.5)
Eosinophils Relative: 4 %
HCT: 23.5 % — ABNORMAL LOW (ref 36.0–46.0)
Hemoglobin: 7 g/dL — ABNORMAL LOW (ref 12.0–15.0)
Immature Granulocytes: 1 %
Lymphocytes Relative: 16 %
Lymphs Abs: 1.4 K/uL (ref 0.7–4.0)
MCH: 24.6 pg — ABNORMAL LOW (ref 26.0–34.0)
MCHC: 29.8 g/dL — ABNORMAL LOW (ref 30.0–36.0)
MCV: 82.5 fL (ref 80.0–100.0)
Monocytes Absolute: 0.7 K/uL (ref 0.1–1.0)
Monocytes Relative: 8 %
Neutro Abs: 6 K/uL (ref 1.7–7.7)
Neutrophils Relative %: 70 %
Platelets: 411 K/uL — ABNORMAL HIGH (ref 150–400)
RBC: 2.85 MIL/uL — ABNORMAL LOW (ref 3.87–5.11)
RDW: 14.7 % (ref 11.5–15.5)
WBC: 8.4 K/uL (ref 4.0–10.5)
nRBC: 0 % (ref 0.0–0.2)

## 2023-09-11 LAB — BASIC METABOLIC PANEL WITH GFR
BUN/Creatinine Ratio: 27 (ref 12–28)
BUN: 28 mg/dL — AB (ref 8–27)
CO2: 19 mmol/L — AB (ref 20–29)
Calcium: 9.4 mg/dL (ref 8.7–10.3)
Chloride: 108 mmol/L — AB (ref 96–106)
Creatinine, Ser: 1.02 mg/dL — AB (ref 0.57–1.00)
Glucose: 110 mg/dL — AB (ref 70–99)
Potassium: 5.7 mmol/L — AB (ref 3.5–5.2)
Sodium: 140 mmol/L (ref 134–144)
eGFR: 57 mL/min/1.73 — AB (ref >59)

## 2023-09-11 LAB — COMPREHENSIVE METABOLIC PANEL WITH GFR
ALT: 13 U/L (ref 0–44)
AST: 17 U/L (ref 15–41)
Albumin: 3.3 g/dL — ABNORMAL LOW (ref 3.5–5.0)
Alkaline Phosphatase: 76 U/L (ref 38–126)
Anion gap: 8 (ref 5–15)
BUN: 24 mg/dL — ABNORMAL HIGH (ref 8–23)
CO2: 20 mmol/L — ABNORMAL LOW (ref 22–32)
Calcium: 8.8 mg/dL — ABNORMAL LOW (ref 8.9–10.3)
Chloride: 109 mmol/L (ref 98–111)
Creatinine, Ser: 0.99 mg/dL (ref 0.44–1.00)
GFR, Estimated: 59 mL/min — ABNORMAL LOW (ref >60)
Glucose, Bld: 126 mg/dL — ABNORMAL HIGH (ref 70–99)
Potassium: 4.9 mmol/L (ref 3.5–5.1)
Sodium: 137 mmol/L (ref 135–145)
Total Bilirubin: 0.6 mg/dL (ref 0.0–1.2)
Total Protein: 6.1 g/dL — ABNORMAL LOW (ref 6.5–8.1)

## 2023-09-11 LAB — IRON AND TIBC
Iron: 19 ug/dL — ABNORMAL LOW (ref 28–170)
Saturation Ratios: 5 % — ABNORMAL LOW (ref 10.4–31.8)
TIBC: 407 ug/dL (ref 250–450)
UIBC: 388 ug/dL

## 2023-09-11 LAB — PREPARE RBC (CROSSMATCH)

## 2023-09-11 LAB — CBC
Hematocrit: 23.5 % — ABNORMAL LOW (ref 34.0–46.6)
Hemoglobin: 6.7 g/dL — CL (ref 11.1–15.9)
MCH: 23.9 pg — ABNORMAL LOW (ref 26.6–33.0)
MCHC: 28.5 g/dL — ABNORMAL LOW (ref 31.5–35.7)
MCV: 84 fL (ref 79–97)
Platelets: 399 x10E3/uL (ref 150–450)
RBC: 2.8 x10E6/uL — ABNORMAL LOW (ref 3.77–5.28)
RDW: 13.9 % (ref 11.7–15.4)
WBC: 8.6 x10E3/uL (ref 3.4–10.8)

## 2023-09-11 LAB — RETICULOCYTES
Immature Retic Fract: 21.4 % — ABNORMAL HIGH (ref 2.3–15.9)
RBC.: 3.25 MIL/uL — ABNORMAL LOW (ref 3.87–5.11)
Retic Count, Absolute: 47 K/uL (ref 19.0–186.0)
Retic Ct Pct: 1.4 % (ref 0.4–3.1)

## 2023-09-11 LAB — FOLATE: Folate: >20 ng/mL (ref >5.9)

## 2023-09-11 LAB — POC OCCULT BLOOD, ED: Fecal Occult Bld: POSITIVE — AB

## 2023-09-11 LAB — ABO/RH: ABO/RH(D): O POS

## 2023-09-11 LAB — PROTIME-INR
INR: 1.3 — ABNORMAL HIGH (ref 0.8–1.2)
Prothrombin Time: 16.7 s — ABNORMAL HIGH (ref 11.4–15.2)

## 2023-09-11 LAB — FERRITIN: Ferritin: 4 ng/mL — ABNORMAL LOW (ref 11–307)

## 2023-09-11 LAB — VITAMIN B12: Vitamin B-12: 206 pg/mL (ref 180–914)

## 2023-09-11 MED ORDER — FERROUS SULFATE 325 (65 FE) MG PO TBEC
325.0000 mg | DELAYED_RELEASE_TABLET | Freq: Every day | ORAL | Status: DC
  Filled 2023-09-11 (×2): qty 1

## 2023-09-11 MED ORDER — SACUBITRIL-VALSARTAN 24-26 MG PO TABS
1.0000 | ORAL_TABLET | Freq: Two times a day (BID) | ORAL | Status: DC
  Administered 2023-09-11 – 2023-09-13 (×4): 1 via ORAL
  Filled 2023-09-11 (×5): qty 1

## 2023-09-11 MED ORDER — SODIUM CHLORIDE 0.9% IV SOLUTION
Freq: Once | INTRAVENOUS | Status: DC
Start: 2023-09-11 — End: 2023-09-13

## 2023-09-11 MED ORDER — SPIRONOLACTONE 12.5 MG HALF TABLET
12.5000 mg | ORAL_TABLET | Freq: Every day | ORAL | Status: DC
  Administered 2023-09-12 – 2023-09-13 (×2): 12.5 mg via ORAL
  Filled 2023-09-11 (×2): qty 1

## 2023-09-11 MED ORDER — PANTOPRAZOLE SODIUM 40 MG IV SOLR
40.0000 mg | Freq: Once | INTRAVENOUS | Status: AC
  Administered 2023-09-11: 40 mg via INTRAVENOUS
  Filled 2023-09-11: qty 10

## 2023-09-11 MED ORDER — ACETAMINOPHEN 500 MG PO TABS: 500.0000 mg | ORAL_TABLET | Freq: Three times a day (TID) | ORAL | Status: DC | PRN

## 2023-09-11 MED ORDER — FERROUS SULFATE 325 (65 FE) MG PO TABS
325.0000 mg | ORAL_TABLET | Freq: Every day | ORAL | Status: DC
  Administered 2023-09-12 – 2023-09-13 (×2): 325 mg via ORAL
  Filled 2023-09-11 (×2): qty 1

## 2023-09-11 MED ORDER — HYDROCODONE-ACETAMINOPHEN 5-325 MG PO TABS
1.0000 | ORAL_TABLET | ORAL | Status: DC | PRN
  Administered 2023-09-11 – 2023-09-12 (×2): 1 via ORAL
  Filled 2023-09-11 (×2): qty 1

## 2023-09-11 MED ORDER — SERTRALINE HCL 50 MG PO TABS
50.0000 mg | ORAL_TABLET | Freq: Every day | ORAL | Status: DC
  Administered 2023-09-12 – 2023-09-13 (×2): 50 mg via ORAL
  Filled 2023-09-11 (×2): qty 1

## 2023-09-11 MED ORDER — ATORVASTATIN CALCIUM 80 MG PO TABS
80.0000 mg | ORAL_TABLET | Freq: Every day | ORAL | Status: DC
  Administered 2023-09-12 – 2023-09-13 (×2): 80 mg via ORAL
  Filled 2023-09-11 (×2): qty 1

## 2023-09-11 MED ORDER — SODIUM CHLORIDE 0.9% IV SOLUTION: Freq: Once | INTRAVENOUS | Status: AC

## 2023-09-11 MED ORDER — SODIUM CHLORIDE (PF) 0.9 % IJ SOLN
INTRAMUSCULAR | Status: AC
  Administered 2023-09-11: 10 mL
  Filled 2023-09-11: qty 10

## 2023-09-11 MED ORDER — GABAPENTIN 300 MG PO CAPS
300.0000 mg | ORAL_CAPSULE | Freq: Three times a day (TID) | ORAL | Status: DC
Start: 2023-09-11 — End: 2023-09-13
  Administered 2023-09-11 – 2023-09-13 (×6): 300 mg via ORAL
  Filled 2023-09-11 (×6): qty 1

## 2023-09-11 MED ORDER — PANTOPRAZOLE SODIUM 40 MG IV SOLR
40.0000 mg | Freq: Two times a day (BID) | INTRAVENOUS | Status: DC
  Administered 2023-09-11 – 2023-09-13 (×4): 40 mg via INTRAVENOUS
  Filled 2023-09-11 (×4): qty 10

## 2023-09-11 MED ORDER — METOPROLOL SUCCINATE ER 25 MG PO TB24
12.5000 mg | ORAL_TABLET | Freq: Every day | ORAL | Status: DC
  Administered 2023-09-11 – 2023-09-12 (×2): 12.5 mg via ORAL
  Filled 2023-09-11 (×2): qty 1

## 2023-09-11 NOTE — H&P (View-Only) (Signed)
 Consultation  Referring Provider: ERMD/ Dean Primary Care Physician:  Trinidad Hun, MD Primary Gastroenterologist:  Dr.Misenheimer/Mathews  Reason for Consultation: Severe anemia/heme positive stool in setting of Plavix  and aspirin   HPI: Gloria Sharp is a 78 y.o. female with prior history of CVA, on chronic Plavix  and aspirin , diabetes mellitus, hypertension, fibromyalgia, obesity, and coronary artery disease.  Patient had admission in June 2025 with angina and underwent cardiac catheterization with angioplasty to the mid circumflex and stent to the RCA.  She was continued on aspirin  and Plavix . She was just seen by cardiology yesterday/Dr. Wonda in follow-up and with complaints of fatigue, shortness of breath with exertion and heaviness in her chest which will resolve with rest.  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 She is planned for gated CTA and apparently a TEE.  Had labs done through cardiology yesterday showing hemoglobin of 6.7, and then was directed to the emergency room this morning.  Patient does have history of very chronic anemia and reports that she stays on an oral iron  supplement at home.  She has required transfusions in the past but the last was probably 2-1/2 to 3 years ago.  He is known to Dr. Larene Bessie and reports that she had a series of EGDs done 2 to 3 years ago and was found to have oozing blood vessels in her stomach lining which required cauterization.  She has not had capsule endoscopy, her last colonoscopy had been done by Dr. Charlanne in [place] 6 to 7 years ago.  She reports significant diverticulosis, no polyps at that time.  Labs today in the ER with hemoglobin of 7/hematocrit 23.5/MCV 82.5/platelets 411 Pro time 16.7/INR 1.3 Potassium 4.9/BUN 24/creatinine 0.99 LFTs within normal limits Stool for occult  blood positive per ER MD  Reviewing recent labs hemoglobin was 8.5 on 07/22/2023 Hemoglobin 8.8-24 June 2023 Hemoglobin 8.5 25 August 2022  Patient says that she drinks dark chocolate Glucerna which she thinks makes her stools a bit darker, and when she takes her iron  tablets she will notice that her stools are a bit darker but other than that has not noted any overt melena or hematochezia, no regular complaints of abdominal pain occasionally says she will have some mid abdominal discomfort postprandially, no nausea or vomiting, no heartburn or indigestion, she does stay on Protonix  40 mg daily. Occasionally also gets a sharp pain at her umbilicus where she has had prior umbilical hernia repair.  She has also had previous cholecystectomy and inguinal hernia repair.    Past Medical History:  Diagnosis Date   AKI (acute kidney injury) (HCC) 03/27/2021   Angina pectoris (HCC) 05/15/2022   Aortic stenosis 09/04/2019   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 sciatica 02/12/2022   Class 3 obesity 03/26/2021   CVA (cerebral vascular accident) [facility]) 2015?   Cystocele with uterine prolapse 01/17/2018   Diabetes (HCC)    Dizziness 03/27/2021   Dyslipidemia 10/10/2018   Essential hypertension 10/10/2018   Fibromyalgia    GERD (gastroesophageal reflux disease)    Heart murmur 10/10/2018   History of CVA (cerebrovascular accident) 10/10/2018   Hydronephrosis of right kidney 03/26/2021   Hypercholesterolemia    Hypertension    Hypokalemia 03/27/2021   Leukocytosis    Mitral regurgitation 11/24/2018   Nonsustained ventricular tachycardia (HCC) 11/24/2018   Obesity    Post-operative nausea and vomiting  hard to wake up   Rectocele 01/17/2018   Sacroiliitis (HCC) 02/12/2022   Sepsis secondary to UTI (HCC) 03/26/2021   Type 2 diabetes mellitus with hyperglycemia (HCC) 03/26/2021   Type 2 diabetes mellitus without complication, without long-term current use of insulin  (HCC) 10/10/2018     Past Surgical History:  Procedure Laterality Date   ABDOMINAL HYSTERECTOMY     partial, left the ovaries   APPENDECTOMY     BREAST CYST ASPIRATION Right    COLONOSCOPY  11/11/2013   Colonioc polyps status post polypectomy. Pancolonic diverticulosis predominately in the sigmoid colon   CORONARY BALLOON ANGIOPLASTY N/A 06/26/2023   Procedure: CORONARY BALLOON ANGIOPLASTY;  Surgeon: Ladona Heinz, MD;  Location: [facility];  Service: Cardiovascular;  Laterality: N/A;   CYSTOSCOPY W/ URETERAL STENT PLACEMENT Right 03/26/2021   Procedure: CYSTOSCOPY WITH RETROGRADE PYELOGRAM/URETERAL STENT PLACEMENT;  Surgeon: Selma Donnice SAUNDERS, MD;  Location: [facility];  Service: Urology;  Laterality: Right;   CYSTOSCOPY/URETEROSCOPY/HOLMIUM LASER/STENT PLACEMENT Right 04/17/2021   Procedure: CYSTOSCOPY/ RETROGRADE/URETEROSCOPY/HOLMIUM LASER/STENT PLACEMENT;  Surgeon: Selma Donnice SAUNDERS, MD;  Location: [facility];  Service: Urology;  Laterality: Right;  ONLY NEEDS 60 MIN   DILATION AND CURETTAGE OF UTERUS     GALLBLADDER SURGERY Right 07/2014   HERNIA REPAIR  02/20/2021   Umbilical   LEFT HEART CATH AND CORONARY ANGIOGRAPHY N/A 05/16/2022   Procedure: LEFT HEART CATH AND CORONARY ANGIOGRAPHY;  Surgeon: Darron Deatrice LABOR, MD;  Location: [facility];  Service: Cardiovascular;  Laterality: N/A;   RIGHT/LEFT HEART CATH AND CORONARY ANGIOGRAPHY N/A 08/30/2022   Procedure: RIGHT/LEFT HEART CATH AND CORONARY ANGIOGRAPHY;  Surgeon: Wendel Lurena POUR, MD;  Location: [facility];  Service: Cardiovascular;  Laterality: N/A;   RIGHT/LEFT HEART CATH AND CORONARY ANGIOGRAPHY N/A 06/26/2023   Procedure: RIGHT/LEFT HEART CATH AND CORONARY ANGIOGRAPHY;  Surgeon: Ladona Heinz, MD;  Location: [facility];  Service: Cardiovascular;  Laterality: N/A;   TEE WITHOUT CARDIOVERSION N/A 09/03/2022   Procedure: TRANSESOPHAGEAL ECHOCARDIOGRAM;  Surgeon: Gardenia Led, DO;  Location: [facility];  Service:  Cardiovascular;  Laterality: N/A;   vaginal polyp removal      Prior to Admission medications   Medication Sig Start Date End Date Taking? Authorizing Provider  acetaminophen  (TYLENOL ) 650 MG CR tablet Take 1,300 mg by mouth daily. May take an additional 650 as needed for pain   Yes [provider]  aspirin  81 MG chewable tablet Chew 1 tablet (81 mg total) by mouth daily. 06/26/23  Yes Henry Manuelita NOVAK, NP  atorvastatin  (LIPITOR ) 80 MG tablet Take 1 tablet (80 mg total) by mouth daily. 01/31/23  Yes Krasowski, Robert J, MD  brimonidine  (ALPHAGAN ) 0.15 % ophthalmic solution Place 1 drop into both eyes 2 (two) times daily. 08/10/19  Yes [provider]  cholestyramine  (QUESTRAN ) 4 g packet Take 1 packet (4 g total) by mouth daily. Take at least 2 hours before or after rest of the medications Patient taking differently: Take 4 g by mouth daily as needed (diarrhea). 03/25/19  Yes Charlanne Groom, MD  clopidogrel  (PLAVIX ) 75 MG tablet Take 1 tablet (75 mg total) by mouth daily. 05/18/22  Yes Williams, Evan, PA-C  Cyanocobalamin  (VITAMIN B-12 PO) Take 1 tablet by mouth daily in the afternoon.   Yes [provider]  ferrous sulfate  325 (65 FE) MG EC tablet Take 325 mg by mouth daily in the afternoon.   Yes [provider]  gabapentin  (NEURONTIN ) 300 MG capsule  Take 300 mg by mouth 3 (three) times daily.   Yes [provider]  HYDROcodone -acetaminophen  (NORCO/VICODIN) 5-325 MG tablet Take 1 tablet by mouth every 4 (four) hours as needed for severe pain (pain score 7-10) or moderate pain (pain score 4-6). 09/03/23  Yes [provider]  MAGNESIUM  PO Take 1 tablet by mouth at bedtime.   Yes [provider]  metFORMIN (GLUCOPHAGE) 1000 MG tablet Take 1,000 mg by mouth 2 (two) times daily with a meal.   Yes [provider]  metoprolol  succinate (TOPROL  XL) 25 MG 24 hr tablet Take 0.5 tablets (12.5 mg total) by mouth daily. Patient taking  differently: Take 12.5 mg by mouth at bedtime. 06/20/23  Yes Theadore Ozell HERO, MD  nitroGLYCERIN  (NITROSTAT ) 0.4 MG SL tablet Place 1 tablet (0.4 mg total) under the tongue every 5 (five) minutes as needed. Patient taking differently: Place 0.4 mg under the tongue every 5 (five) minutes as needed for chest pain. 06/26/23  Yes Henry Manuelita NOVAK, NP  Omega-3 Fatty Acids (FISH OIL PO) Take 1 capsule by mouth daily in the afternoon.   Yes [provider]  OVER THE COUNTER MEDICATION Apply 1 patch topically daily as needed (back pain). [place] pain patch   Yes [provider]  pantoprazole  (PROTONIX ) 40 MG tablet Take 40 mg by mouth daily.   Yes [provider]  sacubitril -valsartan  (ENTRESTO ) 24-26 MG Take 1 tablet by mouth 2 (two) times daily. 06/21/23  Yes Krasowski, Robert J, MD  sertraline  (ZOLOFT ) 50 MG tablet Take 50 mg by mouth daily.   Yes [provider]  spironolactone  (ALDACTONE ) 25 MG tablet Take 0.5 tablets (12.5 mg total) by mouth daily. 03/04/23  Yes Krasowski, Robert J, MD  TURMERIC PO Take 1 tablet by mouth daily in the afternoon.   Yes [provider]  alendronate (FOSAMAX) 35 MG tablet Take 35 mg by mouth every 7 (seven) days. Patient not taking: Reported on 09/11/2023    [provider]  traMADol (ULTRAM) 50 MG tablet Take 50 mg by mouth every 6 (six) hours as needed for moderate pain (pain score 4-6) or severe pain (pain score 7-10).    [provider]    Current Facility-Administered Medications  Medication Dose Route Frequency Provider Last Rate Last Admin   0.9 %  sodium chloride  infusion (Manually program via Guardrails IV Fluids)   Intravenous Once Haviland, Julie, MD       pantoprazole  (PROTONIX ) injection 40 mg  40 mg Intravenous Q12H Jolaine Pac, DO       Current Outpatient Medications  Medication Sig Dispense Refill   acetaminophen  (TYLENOL ) 650 MG CR tablet Take 1,300 mg by mouth daily. May take an  additional 650 as needed for pain     aspirin  81 MG chewable tablet Chew 1 tablet (81 mg total) by mouth daily. 90 tablet 2   atorvastatin  (LIPITOR ) 80 MG tablet Take 1 tablet (80 mg total) by mouth daily. 90 tablet 3   brimonidine  (ALPHAGAN ) 0.15 % ophthalmic solution Place 1 drop into both eyes 2 (two) times daily.     cholestyramine  (QUESTRAN ) 4 g packet Take 1 packet (4 g total) by mouth daily. Take at least 2 hours before or after rest of the medications (Patient taking differently: Take 4 g by mouth daily as needed (diarrhea).) 30 each 11   clopidogrel  (PLAVIX ) 75 MG tablet Take 1 tablet (75 mg total) by mouth daily. 30 tablet 3   Cyanocobalamin  (VITAMIN B-12 PO)  Take 1 tablet by mouth daily in the afternoon.     ferrous sulfate  325 (65 FE) MG EC tablet Take 325 mg by mouth daily in the afternoon.     gabapentin  (NEURONTIN ) 300 MG capsule Take 300 mg by mouth 3 (three) times daily.     HYDROcodone -acetaminophen  (NORCO/VICODIN) 5-325 MG tablet Take 1 tablet by mouth every 4 (four) hours as needed for severe pain (pain score 7-10) or moderate pain (pain score 4-6).     MAGNESIUM  PO Take 1 tablet by mouth at bedtime.     metFORMIN (GLUCOPHAGE) 1000 MG tablet Take 1,000 mg by mouth 2 (two) times daily with a meal.     metoprolol  succinate (TOPROL  XL) 25 MG 24 hr tablet Take 0.5 tablets (12.5 mg total) by mouth daily. (Patient taking differently: Take 12.5 mg by mouth at bedtime.) 45 tablet 3   nitroGLYCERIN  (NITROSTAT ) 0.4 MG SL tablet Place 1 tablet (0.4 mg total) under the tongue every 5 (five) minutes as needed. (Patient taking differently: Place 0.4 mg under the tongue every 5 (five) minutes as needed for chest pain.) 25 tablet 2   Omega-3 Fatty Acids (FISH OIL PO) Take 1 capsule by mouth daily in the afternoon.     OVER THE COUNTER MEDICATION Apply 1 patch topically daily as needed (back pain). [place] pain patch     pantoprazole  (PROTONIX ) 40 MG tablet Take 40 mg by mouth daily.      sacubitril -valsartan  (ENTRESTO ) 24-26 MG Take 1 tablet by mouth 2 (two) times daily. 180 tablet 1   sertraline  (ZOLOFT ) 50 MG tablet Take 50 mg by mouth daily.     spironolactone  (ALDACTONE ) 25 MG tablet Take 0.5 tablets (12.5 mg total) by mouth daily.     TURMERIC PO Take 1 tablet by mouth daily in the afternoon.     alendronate (FOSAMAX) 35 MG tablet Take 35 mg by mouth every 7 (seven) days. (Patient not taking: Reported on 09/11/2023)     traMADol (ULTRAM) 50 MG tablet Take 50 mg by mouth every 6 (six) hours as needed for moderate pain (pain score 4-6) or severe pain (pain score 7-10).      Allergies as of 09/11/2023 - Review Complete 09/11/2023  Allergen Reaction Noted   Celebrex [celecoxib] Itching and Swelling 03/02/2017   Keflex  [cephalexin ] Itching    Relafen [nabumetone] Itching and Swelling 03/02/2017   Cipro [ciprofloxacin hcl] Hives, Itching, and Rash    Flagyl [metronidazole] Itching, Swelling, and Rash 10/07/2018    Family History  Problem Relation Age of Onset   Cancer Mother    Hypertension Mother    Stroke Mother    Diabetes Mother    Heart failure Mother    Hypertension Father    Heart disease Father    Colon cancer Neg Hx    Esophageal cancer Neg Hx    Stomach cancer Neg Hx    Rectal cancer Neg Hx    Colon polyps Neg Hx     Social History   Socioeconomic History   Marital status: Married    Spouse name: Not on file   Number of children: Not on file   Years of education: Not on file   Highest education level: Not on file  Occupational History   Not on file  Tobacco Use   Smoking status: Never   Smokeless tobacco: Never  Vaping Use   Vaping status: Never Used  Substance and Sexual Activity   Alcohol  use: Not Currently   Drug use:  Never   Sexual activity: Not on file  Other Topics Concern   Not on file  Social History Narrative   Not on file   Social Drivers of Health   Financial Resource Strain: Not on file  Food Insecurity: No Food  Insecurity (05/15/2022)   Hunger Vital Sign    Worried About Running Out of Food in the Last Year: Never true    Ran Out of Food in the Last Year: Never true  Transportation Needs: No Transportation Needs (05/15/2022)   PRAPARE - Administrator, Civil Service (Medical): No    Lack of Transportation (Non-Medical): No  Physical Activity: Not on file  Stress: Not on file  Social Connections: Not on file  Intimate Partner Violence: Not At Risk (05/15/2022)   Humiliation, Afraid, Rape, and Kick questionnaire    Fear of Current or Ex-Partner: No    Emotionally Abused: No    Physically Abused: No    Sexually Abused: No    Review of Systems: Pertinent positive and negative review of systems were noted in the above HPI section.  All other review of systems was otherwise negative.   Physical Exam: Vital signs in last 24 hours: Temp:  [97.7 F (36.5 C)-98 F (36.7 C)] 97.7 F (36.5 C) (08/27 1236) Pulse Rate:  [70-74] 74 (08/27 1236) Resp:  [18-19] 19 (08/27 1236) BP: (131-145)/(58-66) 145/58 (08/27 1236) SpO2:  [98 %-100 %] 100 % (08/27 1235)   General:   Alert,  Well-developed, well-nourished, elderly white female pleasant and cooperative in NAD husband at bedside Head:  Normocephalic and atraumatic. Eyes:  Sclera clear, no icterus.   Conjunctiva pale Ears:  Normal auditory acuity. Nose:  No deformity, discharge,  or lesions. Mouth:  No deformity or lesions.   Neck:  Supple; no masses or thyromegaly. Lungs:  Clear throughout to auscultation.   No wheezes, crackles, or rhonchi.  Heart:  Regular rate and rhythm; prominent systolic murmur  Abdomen:  Soft, obese, nontender, BS active,nonpalp mass or hsm.   Rectal: Done, documented heme positive this a.m. Msk:  Symmetrical without gross deformities. . Pulses:  Normal pulses noted. Extremities:  Without clubbing or edema. Neurologic:  Alert and  oriented x4;  grossly normal neurologically. Skin:  Intact without significant  lesions or rashes.. Psych:  Alert and cooperative. Normal mood and affect.  Intake/Output from previous day: No intake/output data recorded. Intake/Output this shift: No intake/output data recorded.  Lab Results: Recent Labs    09/10/23 1300 09/11/23 0941  WBC 8.6 8.4  HGB 6.7* 7.0*  HCT 23.5* 23.5*  PLT 399 411*   BMET Recent Labs    09/10/23 1300 09/11/23 0941  NA 140 137  K 5.7* 4.9  CL 108* 109  CO2 19* 20*  GLUCOSE 110* 126*  BUN 28* 24*  CREATININE 1.02* 0.99  CALCIUM  9.4 8.8*   LFT Recent Labs    09/11/23 0941  PROT 6.1*  ALBUMIN 3.3*  AST 17  ALT 13  ALKPHOS 76  BILITOT 0.6   PT/INR Recent Labs    09/11/23 0941  LABPROT 16.7*  INR 1.3*    IMPRESSION:  #28 78 year old white female with history of chronic anemia, with gradual decline in hemoglobin of about 2 g over the past couple of months, heme positive in the setting of aspirin  and Plavix .  Outpatient hemoglobin yesterday 6.7 and directed to the ER, repeat hemoglobin here 7.0. 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 Larene in [place] with a series of EGDs with cauterization 2 to 3 years ago.  Suspect she has intermittent subacute GI blood loss possibly from recurrent AVMs, chronic gastropathy etc.  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  We will need to discuss with cardiology regarding ability to hold Plavix  for 3 to 4 days in setting of very recent stent, and can coordinate timing of procedures based on that conversation. GI will follow with you.   Amy Esterwood PA-C 09/11/2023, 12:43 PM    ATTENDING ADDENDUM 78 y/o female with CAD with recent stent placed in June on aspirin  and Plavix , severe AS, severe MR - admitted to the hospital with symptomatic anemia. Hgb 7. Stool occult blood positive, dark stools, on iron  chronically. History of upper GI bleeding in the past, lat EGD a few years ago where she reported some cauterization of vessels. Colonoscopy done 6-7 years ago reportedly.   Discussed anemia, IDA, I suspected more than upper tract bleeding given her history. Offered her an EGD / enteroscopy to further evaluate. I think this can be done in the setting of recent Plavix  use to further evaluate and treat endoscopically if needed, as opposed to waiting a full 5 days for Plavix  washout in light of her recent stenting, at risk for in stent thrombosis if we do this. In this light, I offered her an EGD tomorrow. Discussed risks / benefits, they understand and want to proceed. Would hold Plavix  for now, continue IV PPI, give PRBC transfusion today, and plan for EGD tomorrow. Clear liquid diet okay now, but make NPO after MN.   Call with questions or changes in her status in the interim.  Marcey Naval, MD [facility]

## 2023-09-11 NOTE — ED Notes (Signed)
 Per phleb, draw labs after transfusion complete

## 2023-09-11 NOTE — Hospital Course (Addendum)
 Gloria Sharp is a 78 y.o. with a pertinent PMH of CAD s/p PCI 06/2023, aortic stenosis undergoing evaluation for TAVR, mitral regurgitation, HFrEF, type 2 diabetes, hypertension, prior CVA, and lumbar radiculopathy who presented with progressive dyspnea, fatigue, and postural dizziness and is admitted for symptomatic anemia due to likely GI bleed.    #Acute blood loss anemia d/t GAVE #History of upper GI bleed #Iron  deficency Patient initially presented to the ED with dark stools and hemoglobin down to 6.7. She has a reported history of upper GI bleed treated with argon laser photocoagulation. No other bleeding symptoms. During her hospitalization, she received 2 units of PRBCs with improvement of hemoglobin to 9.0 on day of discharge. She was found to be iron  deficient with a low-normal vitamin B12 level. She received 1 IV iron  infusion with plans for outpatient IV iron  in the setting of TEE for TAVR planning. Due to her recent PCI in June 2025, the patient's DAPT was stopped for 1 day for  EGD by GI this admission which showed GAVE treated with APC with good result. Aspirin  and Plavix  restarted on same evening following EGD. Upon discharge, the patient was placed on Carafate  1g 3 times daily with pantoprazole  40 mg twice daily for 30 days then transition to once daily. She also received oral vitamin B12 supplementation. After EGD, the patient was placed on clear liquid diet and advanced to full diet without complications. On day of discharge, the patient was no longer having dark stools or any other bleeding symptoms. She felt great.   #CAD s/p PCI 06/2023 on DAPT #HFrEF #Severe aortic stenosis #Hypertension Patient remained euvolemic with no signs of heart failure exacerbation during this admission. On exam she was warm and well-perfused. She has a TEE scheduled on 09/24/23 for potential TAVR. Cardiology was consulted this admission given acute bleed in the setting of her DAPT. Cardiology originally  recommended DAPT for 6 months but this was held for a few days for her enteroscopy. We continued her home medications including spironolactone  12.5mg  daily, Entresto  24-26mg  bid, metoprolol  succinate 12.5mg  daily, and atorvastatin  80mg  daily.  #Type 2 diabetes Last A1c 5.9 on 05/15/2022. Updated A1c increased to 6.6 on 09/12/2023. Home meds include metformin 1000mg  bid. During her admission, we held her metformin with plans for sliding scale if necessary, however this was not started. She should follow-up outpatient with PCP for any changes to her antihyperglycemic regimen.   #Chronic back pain w/ lumbar radiculopathy  She has a history of chronic back pain and did not have any increased pain during this admission. No acute concerns at this time. We continued her home regimen of gabapentin , Tylenol , and Norco. After 1 dose of her Norco, she was saying that she felt drowsy and wanted more conservative options. She noted using menthol patches at home so we gave her as needed Bengay cream.  #Anxiety/depression During this admission mood was stable and there was no concern acute concerns. We continued her home medication of sertraline  50 mg daily.

## 2023-09-11 NOTE — Consult Note (Addendum)
 Cardiology Consultation   Patient ID: WHITNIE DELEON MRN: [id number]; DOB: 11-Nov-1945  Admit date: 09/11/2023 Date of Consult: 09/11/2023  PCP:  Trinidad Hun, MD   [facility] Providers Cardiologist:  Lamar Fitch, MD      Patient Profile: Gloria Sharp is a 78 y.o. female with a hx of CAD s/p PCI , HTN, HLD, severe AS, CVA, MR, DM who is being seen 09/11/2023 for the evaluation of GI bleed with recent PCI at the request of Dr. Rosan.  History of Present Illness: Gloria Sharp is a 78 year old female with past medical history noted above.  She has been followed by Dr. Fitch as an outpatient.  Was found to have severe aortic stenosis with a mean gradient of 23, dimensionless index of 0.26 and an aortic valve area of 0.86 in the outpatient setting.  She was seen in the office on 6/6 with complaints of chest tightness and set up for outpatient cardiac catheterization.  She underwent right and left heart cath 6/11 with Dr. Ladona with balloon angioplasty of circumflex and DES x 1 to PL branch of distal RCA.  Recommendations for DAPT with aspirin /Plavix  for 6 months.  She was referred to the structural heart team for consideration of TAVR and seen in the office yesterday with Dr. Wonda.  Noted history of's issues with AVMs and followed closely by GI.  CBC was obtained and found to have a hemoglobin of 6.7.  She was instructed to present to the ED today for further evaluation.   In the ED labs sodium 137, potassium 4.9, creatinine 0.99, WBC 8.4, hemoglobin 7.  EKG shows sinus rhythm with left bundle branch block. FOBT positive. Chest x-ray with mild to moderate bronchitic changes, faint nodular opacity in the right mid lung zone which may represent overlapping ribs and vessels with recommendations for follow-up PA and LAT chest films in 3 to 4 weeks.  Admitted to internal medicine for further management.  Ordered for 2 units PRBCs.  Home aspirin  and Plavix  currently held.  Cardiology asked  to evaluate given the need to hold DAPT with recent PCI.  Seen by GI with recommendations for EGD/enteroscopy.    Past Medical History:  Diagnosis Date   AKI (acute kidney injury) (HCC) 03/27/2021   Angina pectoris (HCC) 05/15/2022   Aortic stenosis 09/04/2019   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 sciatica 02/12/2022   Class 3 obesity 03/26/2021   CVA (cerebral vascular accident) [facility]) 2015?   Cystocele with uterine prolapse 01/17/2018   Diabetes (HCC)    Dizziness 03/27/2021   Dyslipidemia 10/10/2018   Essential hypertension 10/10/2018   Fibromyalgia    GERD (gastroesophageal reflux disease)    Heart murmur 10/10/2018   History of CVA (cerebrovascular accident) 10/10/2018   Hydronephrosis of right kidney 03/26/2021   Hypercholesterolemia    Hypertension    Hypokalemia 03/27/2021   Leukocytosis    Mitral regurgitation 11/24/2018   Nonsustained ventricular tachycardia (HCC) 11/24/2018   Obesity    Post-operative nausea and vomiting    hard to wake up   Rectocele 01/17/2018   Sacroiliitis (HCC) 02/12/2022   Sepsis secondary to UTI (HCC) 03/26/2021   Type 2 diabetes mellitus with hyperglycemia (HCC) 03/26/2021   Type 2 diabetes mellitus without complication, without long-term current use of insulin  (HCC) 10/10/2018    Past Surgical History:  Procedure Laterality Date   ABDOMINAL HYSTERECTOMY     partial, left the ovaries   APPENDECTOMY     BREAST CYST ASPIRATION Right  COLONOSCOPY  11/11/2013   Colonioc polyps status post polypectomy. Pancolonic diverticulosis predominately in the sigmoid colon   CORONARY BALLOON ANGIOPLASTY N/A 06/26/2023   Procedure: CORONARY BALLOON ANGIOPLASTY;  Surgeon: Ladona Heinz, MD;  Location: [facility];  Service: Cardiovascular;  Laterality: N/A;   CYSTOSCOPY W/ URETERAL STENT PLACEMENT Right 03/26/2021   Procedure: CYSTOSCOPY WITH RETROGRADE PYELOGRAM/URETERAL STENT PLACEMENT;  Surgeon: Selma Donnice SAUNDERS, MD;  Location: [facility];   Service: Urology;  Laterality: Right;   CYSTOSCOPY/URETEROSCOPY/HOLMIUM LASER/STENT PLACEMENT Right 04/17/2021   Procedure: CYSTOSCOPY/ RETROGRADE/URETEROSCOPY/HOLMIUM LASER/STENT PLACEMENT;  Surgeon: Selma Donnice SAUNDERS, MD;  Location: [facility];  Service: Urology;  Laterality: Right;  ONLY NEEDS 60 MIN   DILATION AND CURETTAGE OF UTERUS     GALLBLADDER SURGERY Right 07/2014   HERNIA REPAIR  02/20/2021   Umbilical   LEFT HEART CATH AND CORONARY ANGIOGRAPHY N/A 05/16/2022   Procedure: LEFT HEART CATH AND CORONARY ANGIOGRAPHY;  Surgeon: Darron Deatrice LABOR, MD;  Location: [facility];  Service: Cardiovascular;  Laterality: N/A;   RIGHT/LEFT HEART CATH AND CORONARY ANGIOGRAPHY N/A 08/30/2022   Procedure: RIGHT/LEFT HEART CATH AND CORONARY ANGIOGRAPHY;  Surgeon: Wendel Lurena POUR, MD;  Location: [facility];  Service: Cardiovascular;  Laterality: N/A;   RIGHT/LEFT HEART CATH AND CORONARY ANGIOGRAPHY N/A 06/26/2023   Procedure: RIGHT/LEFT HEART CATH AND CORONARY ANGIOGRAPHY;  Surgeon: Ladona Heinz, MD;  Location: [facility];  Service: Cardiovascular;  Laterality: N/A;   TEE WITHOUT CARDIOVERSION N/A 09/03/2022   Procedure: TRANSESOPHAGEAL ECHOCARDIOGRAM;  Surgeon: Gardenia Led, DO;  Location: [facility];  Service: Cardiovascular;  Laterality: N/A;   vaginal polyp removal      Scheduled Meds:  sodium chloride    Intravenous Once   [START ON 09/12/2023] atorvastatin   80 mg Oral Daily   ferrous sulfate   325 mg Oral Q1500   gabapentin   300 mg Oral TID   metoprolol  succinate  12.5 mg Oral QHS   pantoprazole  (PROTONIX ) IV  40 mg Intravenous Q12H   sacubitril -valsartan   1 tablet Oral BID   [START ON 09/12/2023] sertraline   50 mg Oral Daily   [START ON 09/12/2023] spironolactone   12.5 mg Oral Daily   Continuous Infusions:  PRN Meds: acetaminophen , HYDROcodone -acetaminophen   Allergies:    Allergies  Allergen Reactions   Celebrex [Celecoxib] Itching and Swelling    Swelling  throughout body Skin redness    Keflex  [Cephalexin ] Itching    09/2018 note: tolerated Augmentin   Relafen [Nabumetone] Itching and Swelling   Cipro [Ciprofloxacin Hcl] Hives, Itching and Rash   Flagyl [Metronidazole] Itching, Swelling and Rash    Social History:   Social History   Socioeconomic History   Marital status: Married    Spouse name: Not on file   Number of children: Not on file   Years of education: Not on file   Highest education level: Not on file  Occupational History   Not on file  Tobacco Use   Smoking status: Never   Smokeless tobacco: Never  Vaping Use   Vaping status: Never Used  Substance and Sexual Activity   Alcohol  use: Not Currently   Drug use: Never   Sexual activity: Not on file  Other Topics Concern   Not on file  Social History Narrative   Not on file   Social Drivers of Health   Financial Resource Strain: Not on file  Food Insecurity: No Food Insecurity (05/15/2022)   Hunger Vital Sign    Worried About Running  Out of Food in the Last Year: Never true    Ran Out of Food in the Last Year: Never true  Transportation Needs: No Transportation Needs (05/15/2022)   PRAPARE - Administrator, Civil Service (Medical): No    Lack of Transportation (Non-Medical): No  Physical Activity: Not on file  Stress: Not on file  Social Connections: Not on file  Intimate Partner Violence: Not At Risk (05/15/2022)   Humiliation, Afraid, Rape, and Kick questionnaire    Fear of Current or Ex-Partner: No    Emotionally Abused: No    Physically Abused: No    Sexually Abused: No    Family History:    Family History  Problem Relation Age of Onset   Cancer Mother    Hypertension Mother    Stroke Mother    Diabetes Mother    Heart failure Mother    Hypertension Father    Heart disease Father    Colon cancer Neg Hx    Esophageal cancer Neg Hx    Stomach cancer Neg Hx    Rectal cancer Neg Hx    Colon polyps Neg Hx      ROS:  Please see the  history of present illness.   All other ROS reviewed and negative.     Physical Exam/Data: Vitals:   09/11/23 1255 09/11/23 1325 09/11/23 1439 09/11/23 1442  BP: (!) 152/55 (!) 148/67 (!) 144/64 121/81  Pulse: 75 72 70 72  Resp: (!) 22 16 14 14   Temp:   97.8 F (36.6 C) 97.8 F (36.6 C)  TempSrc:   Oral Oral  SpO2: 100% 100% 100% 97%    Intake/Output Summary (Last 24 hours) at 09/11/2023 1520 Last data filed at 09/11/2023 1442 Gross per 24 hour  Intake 100 ml  Output --  Net 100 ml      09/10/2023   11:14 AM 08/14/2023   12:18 PM 07/22/2023    2:50 PM  Last 3 Weights  Weight (lbs) 218 lb 216 lb 218 lb  Weight (kg) 98.884 kg 97.977 kg 98.884 kg     There is no height or weight on file to calculate BMI.  General:  Well nourished, well developed, in no acute distress HEENT: normal Neck: no JVD Vascular: No carotid bruits; Distal pulses 2+ bilaterally Cardiac:  normal S1, S2; RRR; 3/6 systolic murmur RUSB Lungs:  clear to auscultation bilaterally, no wheezing, rhonchi or rales  Abd: soft, nontender, no hepatomegaly  Ext: no edema Musculoskeletal:  No deformities, BUE and BLE strength normal and equal Skin: warm and dry  Neuro:  CNs 2-12 intact, no focal abnormalities noted Psych:  Normal affect   EKG:  The EKG was personally reviewed and demonstrates:  sinus rhythm with left bundle branch block Telemetry:  Telemetry was personally reviewed and demonstrates:  Sinus Rhythm   Relevant CV Studies:  Cardiac Catheterization 06/26/23: Hemodynamic data: Hemodynamic data: LV 172/5, EDP 18 mmHg.  Ao 176/55, mean 96 mmHg.  Peak to peak pressure gradient across the aortic valve at 21.7 with a mean gradient of 22.8 mmHg with calculated aortic valve area of 1.14 cm.   RA 12/10, mean 8 mmHg. RV 45/15, EDP 13 mmHg. PA 47/23, mean 32 mmHg PW 23/22, mean 18 mmHg. CO 6.82, CI 3.58 by Fick.  QP/QS 1.0.  PAPi 2.9.   Angiographic data: LM: Mild calcified but logical vessel. LAD:  Large-caliber vessel, gives origin to small D1 and a moderate-sized D2, LAD has mid tandem 40%  stenosis, ostial D2 40% stenosis and mid to distal LAD 40% tandem stenoses. LCx: Moderate caliber vessel with ostial 30% stenosis and after the origin of a small OM1 there is a focal 80% stenosis.  Large OM 2. RCA: Mildly diseased in the proximal segment.  Very large caliber vessel, large PDA and large PL branch with a focal mid 90% stenosis.   Intervention data: Successful score flex balloon angioplasty of mid CX with 2.5 x 10 mm balloon stenosis reduced from 80% to 0%.  TIMI-3 to TIMI-3 flow. Successful stenting of the PL branch of RCA with implantation of a 3.0 x 16 mm Synergy XD DES, stenosis reduced from 90% to 0% with TIMI-3 to TIMI-3 flow.  Highly resilient lesion could not get adequate result with balloon angioplasty.      Impression and recommendations: Patient can be discharged home, she did receive 200 mL of contrast, adequate hydration at home will be recommended.  Will avoid any nephrotoxic agents.  Laboratory Data: High Sensitivity Troponin:   Recent Labs  Lab 08/14/23 1259 08/14/23 1524  TROPONINIHS 16 15     Chemistry Recent Labs  Lab 09/10/23 1300 09/11/23 0941  NA 140 137  K 5.7* 4.9  CL 108* 109  CO2 19* 20*  GLUCOSE 110* 126*  BUN 28* 24*  CREATININE 1.02* 0.99  CALCIUM  9.4 8.8*  GFRNONAA  --  59*  ANIONGAP  --  8    Recent Labs  Lab 09/11/23 0941  PROT 6.1*  ALBUMIN 3.3*  AST 17  ALT 13  ALKPHOS 76  BILITOT 0.6   Lipids No results for input(s): CHOL, TRIG, HDL, LABVLDL, LDLCALC, CHOLHDL in the last 168 hours.  Hematology Recent Labs  Lab 09/10/23 1300 09/11/23 0941  WBC 8.6 8.4  RBC 2.80* 2.85*  HGB 6.7* 7.0*  HCT 23.5* 23.5*  MCV 84 82.5  MCH 23.9* 24.6*  MCHC 28.5* 29.8*  RDW 13.9 14.7  PLT 399 411*   Thyroid  No results for input(s): TSH, FREET4 in the last 168 hours.  BNPNo results for input(s): BNP, PROBNP in the  last 168 hours.  DDimer No results for input(s): DDIMER in the last 168 hours.  Radiology/Studies:  DG Chest 1 View Result Date: 09/11/2023 CLINICAL DATA:  Gastrointestinal bleeding. Ongoing weakness and shortness of breath. EXAM: CHEST  1 VIEW COMPARISON:  08/14/2023 FINDINGS: Stable enlarged cardiac silhouette. Decreased inspiration with clear lungs and normal vascularity. Interval mild-to-moderate peribronchial thickening. Interval faint nodular opacity in the right mid lung zone which appears to represent overlapping ribs and vessels. No acute bony abnormality. IMPRESSION: 1. Interval mild to moderate bronchitic changes. 2. Stable cardiomegaly. 3. Interval faint nodular opacity in the right mid lung zone which appears to represent overlapping ribs and vessels. Recommend follow-up PA and lateral chest radiographs in 3-4 weeks. Electronically Signed   By: Elspeth Bathe M.D.   On: 09/11/2023 10:36     Assessment and Plan:  Gloria Sharp is a 78 y.o. female with a hx of CAD s/p PCI, HTN, HLD, severe AS, CVA, MR, DM, HFimpEF who is being seen 09/11/2023 for the evaluation of GI bleed with recent PCI at the request of Dr. Rosan.  Acute blood loss anemia -- seen in the office yesterday, CBC ordered with Hgb 6.7. Denies any dark tarry stools -- Iron  panel pending -- seen by GI, rec's for EGD/enteroscopy possibly tomorrow. GI ok with continuing plavix  to resume tomorrow evening pending work up -- on IV protonix    CAD  s/p PCI of PL/angioplasty of Lcx -- cath 6/11 with Dr. Ladona with balloon angioplasty of circumflex and DES x 1 to PL branch of distal RCA.  Recommendations for DAPT with aspirin /Plavix  for 6 months. Now presenting with GIB, last dose of plavis/asa this morning. -- no anginal symptoms PTA. Plan to resume tomorrow afternoon pending GI work up -- on statin   Severe AS Moderate mitral regurgitation  -- Echo 09/05/2023 reviewed by Dr. Wonda and noted to have significant calcification  and restriction of her aortic valve leaflets with peak and mean gradients of 45 and 29 mmHg with dimensionless index of 0.29 and aortic valve area of 0.9 cm.  MR was felt to be at least moderately severe with an ER OA of less than 0.31 were suggestive of moderate MR -- Seen in the office yesterday with recommendations for  outpatient TEE. 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   GEN: No acute distress, AO x 3 HEENT:  MMM, no JVD, no scleral icterus Cardiac: RRR, 3/6 SEM,  Respiratory: Clear to auscultation bilaterally. GI: Soft, nontender, non-distended  MS: No edema; No deformity. Neuro:  Nonfocal  Vasc:  +2 radial pulses  The patient is a very pleasant 77 year old female with a history of coronary artery disease status post PCI of the right posterior lateral ventricular branch with 1 drug-eluting stent and mid left circumflex balloon angioplasty in June 2025 due to exertional angina, hypertension, hyperlipidemia, severe aortic stenosis, moderate to severe mitral regurgitation, and type 2 diabetes who presents with a GI bleed.  The patient has noticed increasing shortness of breath and fatigue over the last several weeks.  Her hemoglobin during outpatient evaluation was found to be less than 7.  She was  instructed to proceed to the emergency department.  In the emergency department she was started on a blood transfusion.  Aspirin  and Plavix  are currently being held.  We are consulted in regards to DAPT management around endoscopy.  The patient is scheduled for endoscopy tomorrow.  I agree with holding her aspirin  and Plavix  today and proceeding with endoscopic procedures tomorrow with resumption of both aspirin  and Plavix  thereafter.  The patient does not appear to be in heart failure and is not suffering from angina or cardiac rhythm disorders.  While she does have moderate and perhaps a more severe grade of mitral regurgitation and a TEE is being considered, unfortunately we cannot perform this TEE with the specialized reader needed while she is in-house.  For now would continue her medications for her cardiomyopathy including Toprol  12.5 mg, Entresto  24 x 26 twice daily, and spironolactone  12.5 mg daily.  Will sign off.  Call with questions.  Lurena Red, MD Pager [phone]

## 2023-09-11 NOTE — H&P (View-Only) (Signed)
 Cardiology Consultation   Patient ID: WHITNIE DELEON MRN: [id number]; DOB: 11-Nov-1945  Admit date: 09/11/2023 Date of Consult: 09/11/2023  PCP:  Trinidad Hun, MD   [facility] Providers Cardiologist:  Lamar Fitch, MD      Patient Profile: Gloria Sharp is a 78 y.o. female with a hx of CAD s/p PCI , HTN, HLD, severe AS, CVA, MR, DM who is being seen 09/11/2023 for the evaluation of GI bleed with recent PCI at the request of Dr. Rosan.  History of Present Illness: Gloria Sharp is a 78 year old female with past medical history noted above.  She has been followed by Dr. Fitch as an outpatient.  Was found to have severe aortic stenosis with a mean gradient of 23, dimensionless index of 0.26 and an aortic valve area of 0.86 in the outpatient setting.  She was seen in the office on 6/6 with complaints of chest tightness and set up for outpatient cardiac catheterization.  She underwent right and left heart cath 6/11 with Dr. Ladona with balloon angioplasty of circumflex and DES x 1 to PL branch of distal RCA.  Recommendations for DAPT with aspirin /Plavix  for 6 months.  She was referred to the structural heart team for consideration of TAVR and seen in the office yesterday with Dr. Wonda.  Noted history of's issues with AVMs and followed closely by GI.  CBC was obtained and found to have a hemoglobin of 6.7.  She was instructed to present to the ED today for further evaluation.   In the ED labs sodium 137, potassium 4.9, creatinine 0.99, WBC 8.4, hemoglobin 7.  EKG shows sinus rhythm with left bundle branch block. FOBT positive. Chest x-ray with mild to moderate bronchitic changes, faint nodular opacity in the right mid lung zone which may represent overlapping ribs and vessels with recommendations for follow-up PA and LAT chest films in 3 to 4 weeks.  Admitted to internal medicine for further management.  Ordered for 2 units PRBCs.  Home aspirin  and Plavix  currently held.  Cardiology asked  to evaluate given the need to hold DAPT with recent PCI.  Seen by GI with recommendations for EGD/enteroscopy.    Past Medical History:  Diagnosis Date   AKI (acute kidney injury) (HCC) 03/27/2021   Angina pectoris (HCC) 05/15/2022   Aortic stenosis 09/04/2019   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 sciatica 02/12/2022   Class 3 obesity 03/26/2021   CVA (cerebral vascular accident) [facility]) 2015?   Cystocele with uterine prolapse 01/17/2018   Diabetes (HCC)    Dizziness 03/27/2021   Dyslipidemia 10/10/2018   Essential hypertension 10/10/2018   Fibromyalgia    GERD (gastroesophageal reflux disease)    Heart murmur 10/10/2018   History of CVA (cerebrovascular accident) 10/10/2018   Hydronephrosis of right kidney 03/26/2021   Hypercholesterolemia    Hypertension    Hypokalemia 03/27/2021   Leukocytosis    Mitral regurgitation 11/24/2018   Nonsustained ventricular tachycardia (HCC) 11/24/2018   Obesity    Post-operative nausea and vomiting    hard to wake up   Rectocele 01/17/2018   Sacroiliitis (HCC) 02/12/2022   Sepsis secondary to UTI (HCC) 03/26/2021   Type 2 diabetes mellitus with hyperglycemia (HCC) 03/26/2021   Type 2 diabetes mellitus without complication, without long-term current use of insulin  (HCC) 10/10/2018    Past Surgical History:  Procedure Laterality Date   ABDOMINAL HYSTERECTOMY     partial, left the ovaries   APPENDECTOMY     BREAST CYST ASPIRATION Right  COLONOSCOPY  11/11/2013   Colonioc polyps status post polypectomy. Pancolonic diverticulosis predominately in the sigmoid colon   CORONARY BALLOON ANGIOPLASTY N/A 06/26/2023   Procedure: CORONARY BALLOON ANGIOPLASTY;  Surgeon: Ladona Heinz, MD;  Location: [facility];  Service: Cardiovascular;  Laterality: N/A;   CYSTOSCOPY W/ URETERAL STENT PLACEMENT Right 03/26/2021   Procedure: CYSTOSCOPY WITH RETROGRADE PYELOGRAM/URETERAL STENT PLACEMENT;  Surgeon: Selma Donnice SAUNDERS, MD;  Location: [facility];   Service: Urology;  Laterality: Right;   CYSTOSCOPY/URETEROSCOPY/HOLMIUM LASER/STENT PLACEMENT Right 04/17/2021   Procedure: CYSTOSCOPY/ RETROGRADE/URETEROSCOPY/HOLMIUM LASER/STENT PLACEMENT;  Surgeon: Selma Donnice SAUNDERS, MD;  Location: [facility];  Service: Urology;  Laterality: Right;  ONLY NEEDS 60 MIN   DILATION AND CURETTAGE OF UTERUS     GALLBLADDER SURGERY Right 07/2014   HERNIA REPAIR  02/20/2021   Umbilical   LEFT HEART CATH AND CORONARY ANGIOGRAPHY N/A 05/16/2022   Procedure: LEFT HEART CATH AND CORONARY ANGIOGRAPHY;  Surgeon: Darron Deatrice LABOR, MD;  Location: [facility];  Service: Cardiovascular;  Laterality: N/A;   RIGHT/LEFT HEART CATH AND CORONARY ANGIOGRAPHY N/A 08/30/2022   Procedure: RIGHT/LEFT HEART CATH AND CORONARY ANGIOGRAPHY;  Surgeon: Wendel Lurena POUR, MD;  Location: [facility];  Service: Cardiovascular;  Laterality: N/A;   RIGHT/LEFT HEART CATH AND CORONARY ANGIOGRAPHY N/A 06/26/2023   Procedure: RIGHT/LEFT HEART CATH AND CORONARY ANGIOGRAPHY;  Surgeon: Ladona Heinz, MD;  Location: [facility];  Service: Cardiovascular;  Laterality: N/A;   TEE WITHOUT CARDIOVERSION N/A 09/03/2022   Procedure: TRANSESOPHAGEAL ECHOCARDIOGRAM;  Surgeon: Gardenia Led, DO;  Location: [facility];  Service: Cardiovascular;  Laterality: N/A;   vaginal polyp removal      Scheduled Meds:  sodium chloride    Intravenous Once   [START ON 09/12/2023] atorvastatin   80 mg Oral Daily   ferrous sulfate   325 mg Oral Q1500   gabapentin   300 mg Oral TID   metoprolol  succinate  12.5 mg Oral QHS   pantoprazole  (PROTONIX ) IV  40 mg Intravenous Q12H   sacubitril -valsartan   1 tablet Oral BID   [START ON 09/12/2023] sertraline   50 mg Oral Daily   [START ON 09/12/2023] spironolactone   12.5 mg Oral Daily   Continuous Infusions:  PRN Meds: acetaminophen , HYDROcodone -acetaminophen   Allergies:    Allergies  Allergen Reactions   Celebrex [Celecoxib] Itching and Swelling    Swelling  throughout body Skin redness    Keflex  [Cephalexin ] Itching    09/2018 note: tolerated Augmentin   Relafen [Nabumetone] Itching and Swelling   Cipro [Ciprofloxacin Hcl] Hives, Itching and Rash   Flagyl [Metronidazole] Itching, Swelling and Rash    Social History:   Social History   Socioeconomic History   Marital status: Married    Spouse name: Not on file   Number of children: Not on file   Years of education: Not on file   Highest education level: Not on file  Occupational History   Not on file  Tobacco Use   Smoking status: Never   Smokeless tobacco: Never  Vaping Use   Vaping status: Never Used  Substance and Sexual Activity   Alcohol  use: Not Currently   Drug use: Never   Sexual activity: Not on file  Other Topics Concern   Not on file  Social History Narrative   Not on file   Social Drivers of Health   Financial Resource Strain: Not on file  Food Insecurity: No Food Insecurity (05/15/2022)   Hunger Vital Sign    Worried About Running  Out of Food in the Last Year: Never true    Ran Out of Food in the Last Year: Never true  Transportation Needs: No Transportation Needs (05/15/2022)   PRAPARE - Administrator, Civil Service (Medical): No    Lack of Transportation (Non-Medical): No  Physical Activity: Not on file  Stress: Not on file  Social Connections: Not on file  Intimate Partner Violence: Not At Risk (05/15/2022)   Humiliation, Afraid, Rape, and Kick questionnaire    Fear of Current or Ex-Partner: No    Emotionally Abused: No    Physically Abused: No    Sexually Abused: No    Family History:    Family History  Problem Relation Age of Onset   Cancer Mother    Hypertension Mother    Stroke Mother    Diabetes Mother    Heart failure Mother    Hypertension Father    Heart disease Father    Colon cancer Neg Hx    Esophageal cancer Neg Hx    Stomach cancer Neg Hx    Rectal cancer Neg Hx    Colon polyps Neg Hx      ROS:  Please see the  history of present illness.   All other ROS reviewed and negative.     Physical Exam/Data: Vitals:   09/11/23 1255 09/11/23 1325 09/11/23 1439 09/11/23 1442  BP: (!) 152/55 (!) 148/67 (!) 144/64 121/81  Pulse: 75 72 70 72  Resp: (!) 22 16 14 14   Temp:   97.8 F (36.6 C) 97.8 F (36.6 C)  TempSrc:   Oral Oral  SpO2: 100% 100% 100% 97%    Intake/Output Summary (Last 24 hours) at 09/11/2023 1520 Last data filed at 09/11/2023 1442 Gross per 24 hour  Intake 100 ml  Output --  Net 100 ml      09/10/2023   11:14 AM 08/14/2023   12:18 PM 07/22/2023    2:50 PM  Last 3 Weights  Weight (lbs) 218 lb 216 lb 218 lb  Weight (kg) 98.884 kg 97.977 kg 98.884 kg     There is no height or weight on file to calculate BMI.  General:  Well nourished, well developed, in no acute distress HEENT: normal Neck: no JVD Vascular: No carotid bruits; Distal pulses 2+ bilaterally Cardiac:  normal S1, S2; RRR; 3/6 systolic murmur RUSB Lungs:  clear to auscultation bilaterally, no wheezing, rhonchi or rales  Abd: soft, nontender, no hepatomegaly  Ext: no edema Musculoskeletal:  No deformities, BUE and BLE strength normal and equal Skin: warm and dry  Neuro:  CNs 2-12 intact, no focal abnormalities noted Psych:  Normal affect   EKG:  The EKG was personally reviewed and demonstrates:  sinus rhythm with left bundle branch block Telemetry:  Telemetry was personally reviewed and demonstrates:  Sinus Rhythm   Relevant CV Studies:  Cardiac Catheterization 06/26/23: Hemodynamic data: Hemodynamic data: LV 172/5, EDP 18 mmHg.  Ao 176/55, mean 96 mmHg.  Peak to peak pressure gradient across the aortic valve at 21.7 with a mean gradient of 22.8 mmHg with calculated aortic valve area of 1.14 cm.   RA 12/10, mean 8 mmHg. RV 45/15, EDP 13 mmHg. PA 47/23, mean 32 mmHg PW 23/22, mean 18 mmHg. CO 6.82, CI 3.58 by Fick.  QP/QS 1.0.  PAPi 2.9.   Angiographic data: LM: Mild calcified but logical vessel. LAD:  Large-caliber vessel, gives origin to small D1 and a moderate-sized D2, LAD has mid tandem 40%  stenosis, ostial D2 40% stenosis and mid to distal LAD 40% tandem stenoses. LCx: Moderate caliber vessel with ostial 30% stenosis and after the origin of a small OM1 there is a focal 80% stenosis.  Large OM 2. RCA: Mildly diseased in the proximal segment.  Very large caliber vessel, large PDA and large PL branch with a focal mid 90% stenosis.   Intervention data: Successful score flex balloon angioplasty of mid CX with 2.5 x 10 mm balloon stenosis reduced from 80% to 0%.  TIMI-3 to TIMI-3 flow. Successful stenting of the PL branch of RCA with implantation of a 3.0 x 16 mm Synergy XD DES, stenosis reduced from 90% to 0% with TIMI-3 to TIMI-3 flow.  Highly resilient lesion could not get adequate result with balloon angioplasty.      Impression and recommendations: Patient can be discharged home, she did receive 200 mL of contrast, adequate hydration at home will be recommended.  Will avoid any nephrotoxic agents.  Laboratory Data: High Sensitivity Troponin:   Recent Labs  Lab 08/14/23 1259 08/14/23 1524  TROPONINIHS 16 15     Chemistry Recent Labs  Lab 09/10/23 1300 09/11/23 0941  NA 140 137  K 5.7* 4.9  CL 108* 109  CO2 19* 20*  GLUCOSE 110* 126*  BUN 28* 24*  CREATININE 1.02* 0.99  CALCIUM  9.4 8.8*  GFRNONAA  --  59*  ANIONGAP  --  8    Recent Labs  Lab 09/11/23 0941  PROT 6.1*  ALBUMIN 3.3*  AST 17  ALT 13  ALKPHOS 76  BILITOT 0.6   Lipids No results for input(s): CHOL, TRIG, HDL, LABVLDL, LDLCALC, CHOLHDL in the last 168 hours.  Hematology Recent Labs  Lab 09/10/23 1300 09/11/23 0941  WBC 8.6 8.4  RBC 2.80* 2.85*  HGB 6.7* 7.0*  HCT 23.5* 23.5*  MCV 84 82.5  MCH 23.9* 24.6*  MCHC 28.5* 29.8*  RDW 13.9 14.7  PLT 399 411*   Thyroid  No results for input(s): TSH, FREET4 in the last 168 hours.  BNPNo results for input(s): BNP, PROBNP in the  last 168 hours.  DDimer No results for input(s): DDIMER in the last 168 hours.  Radiology/Studies:  DG Chest 1 View Result Date: 09/11/2023 CLINICAL DATA:  Gastrointestinal bleeding. Ongoing weakness and shortness of breath. EXAM: CHEST  1 VIEW COMPARISON:  08/14/2023 FINDINGS: Stable enlarged cardiac silhouette. Decreased inspiration with clear lungs and normal vascularity. Interval mild-to-moderate peribronchial thickening. Interval faint nodular opacity in the right mid lung zone which appears to represent overlapping ribs and vessels. No acute bony abnormality. IMPRESSION: 1. Interval mild to moderate bronchitic changes. 2. Stable cardiomegaly. 3. Interval faint nodular opacity in the right mid lung zone which appears to represent overlapping ribs and vessels. Recommend follow-up PA and lateral chest radiographs in 3-4 weeks. Electronically Signed   By: Elspeth Bathe M.D.   On: 09/11/2023 10:36     Assessment and Plan:  Gloria Sharp is a 78 y.o. female with a hx of CAD s/p PCI, HTN, HLD, severe AS, CVA, MR, DM, HFimpEF who is being seen 09/11/2023 for the evaluation of GI bleed with recent PCI at the request of Dr. Rosan.  Acute blood loss anemia -- seen in the office yesterday, CBC ordered with Hgb 6.7. Denies any dark tarry stools -- Iron  panel pending -- seen by GI, rec's for EGD/enteroscopy possibly tomorrow. GI ok with continuing plavix  to resume tomorrow evening pending work up -- on IV protonix    CAD  s/p PCI of PL/angioplasty of Lcx -- cath 6/11 with Dr. Ladona with balloon angioplasty of circumflex and DES x 1 to PL branch of distal RCA.  Recommendations for DAPT with aspirin /Plavix  for 6 months. Now presenting with GIB, last dose of plavis/asa this morning. -- no anginal symptoms PTA. Plan to resume tomorrow afternoon pending GI work up -- on statin   Severe AS Moderate mitral regurgitation  -- Echo 09/05/2023 reviewed by Dr. Wonda and noted to have significant calcification  and restriction of her aortic valve leaflets with peak and mean gradients of 45 and 29 mmHg with dimensionless index of 0.29 and aortic valve area of 0.9 cm.  MR was felt to be at least moderately severe with an ER OA of less than 0.31 were suggestive of moderate MR -- Seen in the office yesterday with recommendations for  outpatient TEE. 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   GEN: No acute distress, AO x 3 HEENT:  MMM, no JVD, no scleral icterus Cardiac: RRR, 3/6 SEM,  Respiratory: Clear to auscultation bilaterally. GI: Soft, nontender, non-distended  MS: No edema; No deformity. Neuro:  Nonfocal  Vasc:  +2 radial pulses  The patient is a very pleasant 77 year old female with a history of coronary artery disease status post PCI of the right posterior lateral ventricular branch with 1 drug-eluting stent and mid left circumflex balloon angioplasty in June 2025 due to exertional angina, hypertension, hyperlipidemia, severe aortic stenosis, moderate to severe mitral regurgitation, and type 2 diabetes who presents with a GI bleed.  The patient has noticed increasing shortness of breath and fatigue over the last several weeks.  Her hemoglobin during outpatient evaluation was found to be less than 7.  She was  instructed to proceed to the emergency department.  In the emergency department she was started on a blood transfusion.  Aspirin  and Plavix  are currently being held.  We are consulted in regards to DAPT management around endoscopy.  The patient is scheduled for endoscopy tomorrow.  I agree with holding her aspirin  and Plavix  today and proceeding with endoscopic procedures tomorrow with resumption of both aspirin  and Plavix  thereafter.  The patient does not appear to be in heart failure and is not suffering from angina or cardiac rhythm disorders.  While she does have moderate and perhaps a more severe grade of mitral regurgitation and a TEE is being considered, unfortunately we cannot perform this TEE with the specialized reader needed while she is in-house.  For now would continue her medications for her cardiomyopathy including Toprol  12.5 mg, Entresto  24 x 26 twice daily, and spironolactone  12.5 mg daily.  Will sign off.  Call with questions.  Lurena Red, MD Pager [phone]

## 2023-09-11 NOTE — Progress Notes (Signed)
 NEW ADMISSION NOTE New Admission Note:   Arrival Method: Patient arrived from ED Mental Orientation: alert and oriented x 4 Telemetry: [id number]-03 Assessment: Completed Skin: warm and dry IV: R FA infusing blood. Pain: Denies any pain. Tubes: None. Safety Measures: Safety Fall Prevention Plan has been given, discussed and implemented. Admission: Completed [facility] Orientation: Patient has been orientated to the room, unit and staff.  Family: Husband at bedside  Orders have been reviewed and implemented. Will continue to monitor the patient. Call light has been placed within reach and bed alarm has been activated.   Franciso Bodily, RN

## 2023-09-11 NOTE — Telephone Encounter (Signed)
 Was informed by the over night fellow that they were contacted by [facility] because the patient had a hemoglobin of 6.7. It appears like her hemoglobin has been trending downwards for the last 2-3 months. I contacted the patient and her husband. Patients husband reported that she has a history of stomach bleeding that has been cauterized by GI in the past. She received PCI to the RCA on 06/26/23 and is on aspirin  and plavix . Recommended that the patient go to the emergency department to get a blood transfusion. Patients husband reported that they are going to the office to get labs redrawn and will likely go to the ER following that.  Signed,  Morse Clause, PA-C 09/11/2023, 8:15 AM

## 2023-09-11 NOTE — ED Notes (Signed)
 Patient refused second IV placement

## 2023-09-11 NOTE — Progress Notes (Incomplete)
 HD#1 SUBJECTIVE:  Patient Summary: Gloria Sharp is a 78 y.o. with a pertinent PMH of CAD s/p PCI 06/2023, aortic stenosis undergoing evaluation for TAVR, mitral regurgitation, HFrEF, type 2 diabetes, hypertension, prior CVA, and lumbar radiculopathy who presented with progressive  dyspnea, fatigue, and postural dizziness and is admitted for symptomatic anemia due to likely GI bleed.   Overnight Events: None  Interim History: Patient reports she is not having any abdominal or chest pain. Procedure went well this morning and just had questions regarding the results. No nausea or vomiting, tolerating her clear liquid diet well. She is complaining of hip pain from a pinched nerve in her back which has been a chronic issue for her. Received one Norco last night.  OBJECTIVE:  Vital Signs: Vitals:   09/12/23 0801 09/12/23 0914 09/12/23 0920 09/12/23 0930  BP: (!) 151/66 125/66 135/69 125/68  Pulse:  93 89 83  Resp: 16  20 15   Temp: (!) 97.5 F (36.4 C) (!) 97 F (36.1 C)    TempSrc: Temporal Temporal    SpO2: 93% 100% 95% 95%  Weight: 100 kg     Height: 5' (1.524 m)      Supplemental O2: Room Air SpO2: 95 %  Filed Weights   09/11/23 1858 09/12/23 0801  Weight: 100 kg 100 kg     Intake/Output Summary (Last 24 hours) at 09/12/2023 1143 Last data filed at 09/12/2023 0951 Gross per 24 hour  Intake 901.33 ml  Output 0 ml  Net 901.33 ml   Net IO Since Admission: 901.33 mL [09/12/23 1143]  Physical Exam: Constitutional: well-appearing, well-nourished, in no acute distress HENT: normocephalic atraumatic, mucous membranes moist Eyes: conjunctiva non-erythematous, PERRL, no scleral icterus Cardiovascular: regular rate and rhythm; harsh systolic murmur heard diffusely Pulmonary/Chest: normal work of breathing on room air, anterior lung fields CTAB Abdominal: soft, non-tender, non-distended, bowel sounds normal MSK: normal bulk and tone Neurological: alert & oriented x3  Skin: warm  and dry Extremities: no edema or cyanosis; peripheral pulses intact Psych: normal mood and affect, thought content normal  Patient Lines/Drains/Airways Status     Active Line/Drains/Airways     Name Placement date Placement time Site Days   Peripheral IV 09/11/23 20 G Anterior;Proximal;Right Forearm 09/11/23  1118  Forearm  less than 1            Pertinent labs and imaging:      Latest Ref Rng & Units 09/12/2023    5:58 AM 09/12/2023   12:53 AM 09/11/2023    9:41 AM  CBC  WBC 4.0 - 10.5 K/uL 9.9   8.4   Hemoglobin 12.0 - 15.0 g/dL 9.0  8.3  7.0   Hematocrit 36.0 - 46.0 % 28.4  26.7  23.5   Platelets 150 - 400 K/uL 374   411        Latest Ref Rng & Units 09/12/2023    5:58 AM 09/11/2023    9:41 AM 09/10/2023    1:00 PM  CMP  Glucose 70 - 99 mg/dL 88  873  889   BUN 8 - 23 mg/dL 16  24  28    Creatinine 0.44 - 1.00 mg/dL 9.04  9.00  8.97   Sodium 135 - 145 mmol/L 139  137  140   Potassium 3.5 - 5.1 mmol/L 4.3  4.9  5.7   Chloride 98 - 111 mmol/L 108  109  108   CO2 22 - 32 mmol/L 22  20  19  Calcium  8.9 - 10.3 mg/dL 8.9  8.8  9.4   Total Protein 6.5 - 8.1 g/dL  6.1    Total Bilirubin 0.0 - 1.2 mg/dL  0.6    Alkaline Phos 38 - 126 U/L  76    AST 15 - 41 U/L  17    ALT 0 - 44 U/L  13      No results found.   ASSESSMENT/PLAN:  Assessment: Principal Problem:   Acute blood loss anemia Active Problems:   Essential hypertension   Aortic stenosis   Diabetes (HCC)   Dizziness   HFrEF (heart failure with reduced ejection fraction) (HCC)   CAD (coronary artery disease)   Symptomatic anemia   Upper GI bleed   Antiplatelet or antithrombotic long-term use   Gastric polyp   GAVE (gastric antral vascular ectasia)  Gloria Sharp is a 78 y.o. with a pertinent PMH of CAD s/p PCI 06/2023, aortic stenosis undergoing evaluation for TAVR, mitral regurgitation, HFrEF, type 2 diabetes, hypertension, prior CVA, and lumbar radiculopathy who presented with progressive  dyspnea,  fatigue, and postural dizziness and is admitted for symptomatic anemia due to likely GI bleed.   Plan: #Acute blood loss anemia d/t GAVE #History of upper GI bleed #Iron  deficency Patient reports feeling well, tolerating CLD well. No complaints of N/V/D. No other bleeding symptoms. Enteroscopy showed GAVE which was treated with APC ablation with good results. Today Hgb increased to 9.0. Iron  deficiency anemia in the setting of GI bleed as ferritin of 4 and low saturation ratios. Folate wnl but vitamin B12 low-normal. Unconfirmed report of prior upper GI bleed treated with APC. She has been on aspirin  and Plavix  for the past 2 months after repeat PCI with plans to continue dual antiplatelet therapy for 6 months minimum. Given significant cardiac comorbidities goal Hgb is 8. GI is following. -GI consulted, appreciate recs:  -s/p Enteroscopy this AM: GAVE tx with APC  -Carafate  1g tid  -Resume CLD and advance as tolerated   -resume Aspirin  + Plavix  this evening  -continue IV protonix  40mg  bid for 30 days then change to daily  -follow up with outpatient GI -s/p 2 units pRBCs on 8/27; goal is Hgb > 8 -IV Iron  infusion, will need referral for outpatient IV iron  as well -PO Vitamin B12 1000mcg -Continue SCDs   #CAD s/p PCI 06/2023 on DAPT Recent balloon angioplasty during catheterization with Dr. Ladona on 6/11. Recommendations were to continue Aspirin  and Plavix  for 6 months. Will be holding in the setting of acute GI bleed for enteroscopy and resume after procedure. -Resume aspirin  81mg  at bedtime -Resume plavix  75mg  at bedtime -Continue atorvastatin  80 mg daily  #Severe aortic stenosis # Moderate to Severe mitral regurgitation Echo on 09/05/23 showing significant aortic valve calcification with flow restriction. Cardiology may be considering TAVR and current plan is for outpatient TEE scheduled on 09/24/23. Not able to be completed this admission.   #Chronic HFrEF EF as low as 25% but has  recovered a bit. She is not volume overloaded on exam. No concerns regarding heart failure at this moment. -Continue spironolactone  12.5 mg daily -Continue Entresto  24-26 mg bid -Continue metoprolol  succinate 12.5 mg daily  #Hypertension Due to cardiology recommending 6 months of uninterrupted DAPT following balloon angioplasty of mid circumflex and stent of the PL branch of the RCA on 06/26/2023 we will consult cardiology. In addition to this she has been getting outpatient workup for severe aortic stenosis with planned TEE on 09/24/2023.  Not able  to get TEE done during this admission, plan is to proceed with outpatient study. No signs of heart failure exacerbation at the moment and she is warm and well-perfused. Will clarify how long patient can be off DAPT in setting of acute bleed.  #Type 2 diabetes Last A1c 5.9 on 05/15/2022. Updated A1c pending. Home meds include metformin 1000mg  bid. -Hold home metformin -Repeat A1c pending -Will add SSI if any significant hyperglycemia during admission   #Chronic back pain w/ lumbar radiculopathy  Mentions some increased pain in her right hip after her procedure. Notes that she uses menthol patches at home. Will add additional prn for menthol cream since we do not have patches on formulary. -Pain regimen: -Gabapentin  300 mg tid -Tylenol  500 mg q8h prn for mild pain -Hydrocodone /Acetaminophen  5/325 mg q4h prn for severe pain -Bengay cream prn  #Anxiety/depression Mood is stable. No acute concerns. Can continue home medication. -continue sertraline  50 mg daily  Best Practice: Diet: NPO for procedure VTE: SCDs Start: 09/11/23 1158 Code: Full  Disposition planning: DISPO: Anticipated discharge tomorrow pending Hgb recheck  Signature:  Letha Cheadle, MD [facility]  11:43 AM, 09/12/2023  On Call pager [phone]

## 2023-09-11 NOTE — ED Triage Notes (Signed)
 Pt sent from PCP due to hgb 6.7 from labs drawn yesterday. Pt does endorse weakness and SOB for awhile. Denies blood in stool.

## 2023-09-11 NOTE — H&P (Cosign Needed)
 Date: 09/11/2023               Patient Name:  Gloria Sharp MRN: [id number]  DOB: [birth year] Age / Sex: 78 y.o., female   PCP: Trinidad Hun, MD         Medical Service: Internal Medicine Teaching Service         Attending Physician: Dr. Rosan Dayton BROCKS, DO      First Contact: Dr. Letha Cheadle, MD    Second Contact: Dr. Roetta Chars, MD         After Hours (After 5p/  First Contact Pager: [phone]  weekends / holidays): Second Contact Pager: [phone]   SUBJECTIVE   Chief Complaint: Dyspnea, fatigue, and postural dizziness  History of Present Illness:  Gloria Sharp is a 78 year old female with a past medical history of CAD s/p PCI 06/2023, aortic stenosis undergoing evaluation for TAVR, mitral regurgitation, HFrEF, type 2 diabetes, hypertension, prior CVA, and lumbar radiculopathy who presents with weeks of progressive fatigue, dyspnea on exertion, and postural dizziness without syncope.  She was sent in after getting lab work with her cardiology office which showed her hemoglobin down to 6.7.  She reports having a similar problem in the past and underwent EGD with reported gastritis versus gastric AVMs which were treated with lasers (unable to see corresponding procedure or other reports to confirm).  She has intermittent dark-colored stools but notes this only happens when she drinks chocolate Glucerna shakes and is also on iron  pills.  She has about 5-10 bowel movements a day which is stable for and she takes cholestyramine  with some benefit.  No recent increase in stool number or amount.  No recent increase in number of dark-colored stools or any other noted red blood in her stool.  She has significant dyspnea on exertion that is complicated by her aortic stenosis but does think that this has been getting worse over the last several weeks.  She has had some dizziness when standing up from a seated position but denies any related falls or syncope.  She did fall in the shower a couple weeks ago  but this was due to the chair moving out from under her.  She has some mild intermittent lower abdominal pain that occurs primarily after eating and some intermittent nausea without  vomiting.  Otherwise she denies any other associated symptoms including fever, dyspepsia, other abnormal bleeding, change in urinary habits, orthopnea, or increased lower extremity edema.  ED Course: Presented as above hemodynamically stable and lab work showed hemoglobin of 7.0 compared to 6.7 yesterday in the office.  Metabolic panel without significant abnormality and platelets 411 with INR 1.3 and fecal occult blood positive.  Past Medical History CAD s/p CI most recently 06/2023 Aortic stenosis, severe, undergoing evaluation for TAVR Mitral regurgitation HFrEF Type 2 diabetes Hypertension Prior CVA Lumbar to radiculopathy Fibromyalgia GERD  Meds:  Aspirin  81 mg daily Atorvastatin  80 mg daily Clopidogrel  75 mg daily Gabapentin  300 mg 3 times daily Metformin 1000 mg twice daily Metoprolol  succinate 12.5 mg daily Nitroglycerin  sublingual tablets as needed Pantoprazole  40 mg daily Sacubitril /valsartan  24/26 mg twice daily Spironolactone  12.5 mg daily Hydrocodone /acetaminophen  5/325 mg every 4 hours as needed Herbal pain remedy with turmeric and it Ferrous sulfate  325 mg daily Cholestyramine  4 g daily as needed Sertraline  50 mg daily  Past Surgical History Past Surgical History:  Procedure Laterality Date   ABDOMINAL HYSTERECTOMY     partial, left the ovaries   APPENDECTOMY  BREAST CYST ASPIRATION Right    COLONOSCOPY  11/11/2013   Colonioc polyps status post polypectomy. Pancolonic diverticulosis predominately in the sigmoid colon   CORONARY BALLOON ANGIOPLASTY N/A 06/26/2023   Procedure: CORONARY BALLOON ANGIOPLASTY;  Surgeon: Ladona Heinz, MD;  Location: [facility];  Service: Cardiovascular;  Laterality: N/A;   CYSTOSCOPY W/ URETERAL STENT PLACEMENT Right 03/26/2021   Procedure:  CYSTOSCOPY WITH RETROGRADE PYELOGRAM/URETERAL STENT PLACEMENT;  Surgeon: Selma Donnice SAUNDERS, MD;  Location: [facility];  Service: Urology;  Laterality: Right;   CYSTOSCOPY/URETEROSCOPY/HOLMIUM LASER/STENT PLACEMENT Right 04/17/2021   Procedure: CYSTOSCOPY/ RETROGRADE/URETEROSCOPY/HOLMIUM LASER/STENT PLACEMENT;  Surgeon: Selma Donnice SAUNDERS, MD;  Location: [facility];  Service: Urology;  Laterality: Right;  ONLY NEEDS 60 MIN   DILATION AND CURETTAGE OF UTERUS     GALLBLADDER SURGERY Right 07/2014   HERNIA REPAIR  02/20/2021   Umbilical   LEFT HEART CATH AND CORONARY ANGIOGRAPHY N/A 05/16/2022   Procedure: LEFT HEART CATH AND CORONARY ANGIOGRAPHY;  Surgeon: Darron Deatrice LABOR, MD;  Location: [facility];  Service: Cardiovascular;  Laterality: N/A;   RIGHT/LEFT HEART CATH AND CORONARY ANGIOGRAPHY N/A 08/30/2022   Procedure: RIGHT/LEFT HEART CATH AND CORONARY ANGIOGRAPHY;  Surgeon: Wendel Lurena POUR, MD;  Location: [facility];  Service: Cardiovascular;  Laterality: N/A;   RIGHT/LEFT HEART CATH AND CORONARY ANGIOGRAPHY N/A 06/26/2023   Procedure: RIGHT/LEFT HEART CATH AND CORONARY ANGIOGRAPHY;  Surgeon: Ladona Heinz, MD;  Location: [facility];  Service: Cardiovascular;  Laterality: N/A;   TEE WITHOUT CARDIOVERSION N/A 09/03/2022   Procedure: TRANSESOPHAGEAL ECHOCARDIOGRAM;  Surgeon: Gardenia Led, DO;  Location: [facility];  Service: Cardiovascular;  Laterality: N/A;   vaginal polyp removal      Social:  Lives at home with husband and grandson.  Independent in ADLs but dependent in some IADLs.  Uses a walker for ambulation. PCP: Trinidad Hun, MD Substances: Denies current or prior use of tobacco, alcohol , or drugs.  Family History:  Family History  Problem Relation Age of Onset   Cancer Mother    Hypertension Mother    Stroke Mother    Diabetes Mother    Heart failure Mother    Hypertension Father    Heart disease Father    Colon cancer Neg Hx    Esophageal cancer Neg Hx     Stomach cancer Neg Hx    Rectal cancer Neg Hx    Colon polyps Neg Hx     Allergies: Allergies as of 09/11/2023 - Review Complete 09/11/2023  Allergen Reaction Noted   Celebrex [celecoxib] Itching and Swelling 03/02/2017   Keflex  [cephalexin ] Itching    Relafen [nabumetone] Itching and Swelling 03/02/2017   Cipro [ciprofloxacin hcl] Hives, Itching, and Rash    Flagyl [metronidazole] Itching, Swelling, and Rash 10/07/2018    Review of Systems: A complete ROS was negative except as per HPI.   OBJECTIVE:   Physical Exam: Blood pressure 131/66, pulse 70, temperature 98 F (36.7 C), temperature source Oral, resp. rate 18, SpO2 98%.  Constitutional: Comfortable appearing elderly female laying in bed. In no acute distress. HENT: Normocephalic, atraumatic,  Eyes: Sclera non-icteric, PERRL, EOM intact Cardio:Regular rate and rhythm. 2+ bilateral radial and dorsalis pedis  pulses. Pulm:Clear to auscultation bilaterally in the anterior fields. Normal work of breathing on room air. Abdomen: Soft, non-tender, non-distended, positive bowel sounds. MSK: Trace lower extremity edema bilaterally Skin:Warm and dry. Neuro:Alert and oriented x3. No focal deficit noted. Psych:Pleasant mood and affect.  Labs: CBC  Component Value Date/Time   WBC 8.4 09/11/2023 0941   RBC 2.85 (L) 09/11/2023 0941   HGB 7.0 (L) 09/11/2023 0941   HGB 6.7 (LL) 09/10/2023 1300   HCT 23.5 (L) 09/11/2023 0941   HCT 23.5 (L) 09/10/2023 1300   PLT 411 (H) 09/11/2023 0941   PLT 399 09/10/2023 1300   MCV 82.5 09/11/2023 0941   MCV 84 09/10/2023 1300   MCH 24.6 (L) 09/11/2023 0941   MCHC 29.8 (L) 09/11/2023 0941   RDW 14.7 09/11/2023 0941   RDW 13.9 09/10/2023 1300   LYMPHSABS 1.4 09/11/2023 0941   MONOABS 0.7 09/11/2023 0941   EOSABS 0.3 09/11/2023 0941   BASOSABS 0.0 09/11/2023 0941     CMP     Component Value Date/Time   NA 137 09/11/2023 0941   NA 140 09/10/2023 1300   K 4.9 09/11/2023 0941   CL 109  09/11/2023 0941   CO2 20 (L) 09/11/2023 0941   GLUCOSE 126 (H) 09/11/2023 0941   BUN 24 (H) 09/11/2023 0941   BUN 28 (H) 09/10/2023 1300   CREATININE 0.99 09/11/2023 0941   CALCIUM  8.8 (L) 09/11/2023 0941   PROT 6.1 (L) 09/11/2023 0941   ALBUMIN 3.3 (L) 09/11/2023 0941   AST 17 09/11/2023 0941   ALT 13 09/11/2023 0941   ALKPHOS 76 09/11/2023 0941   BILITOT 0.6 09/11/2023 0941   GFRNONAA 59 (L) 09/11/2023 0941   GFRAA 67 03/08/2020 1013    Imaging: DG Chest 1 View Result Date: 09/11/2023 CLINICAL DATA:  Gastrointestinal bleeding. Ongoing weakness and shortness of breath. EXAM: CHEST  1 VIEW COMPARISON:  08/14/2023 FINDINGS: Stable enlarged cardiac silhouette. Decreased inspiration with clear lungs and normal vascularity. Interval mild-to-moderate peribronchial thickening. Interval faint nodular opacity in the right mid lung zone which appears to represent overlapping ribs and vessels. No acute bony abnormality. IMPRESSION: 1. Interval mild to moderate bronchitic changes. 2. Stable cardiomegaly. 3. Interval faint nodular opacity in the right mid lung zone which appears to represent overlapping ribs and vessels. Recommend follow-up PA and lateral chest radiographs in 3-4 weeks. Electronically Signed   By: Elspeth Bathe M.D.   On: 09/11/2023 10:36     EKG: personally reviewed my interpretation is sinus rhythm with stable left bundle branch block. Consistent with prior EKG.  ASSESSMENT & PLAN:   Assessment & Plan by Problem: Principal Problem:   Acute blood loss anemia Active Problems:   Essential hypertension   Aortic stenosis   Diabetes (HCC)   Dizziness   HFrEF (heart failure with reduced ejection fraction) (HCC)   CAD (coronary artery disease)   JOYCELYNN FRITSCHE is a 78 y.o. female with pertinent PMH of CAD s/p PCI 06/2023, aortic stenosis undergoing evaluation for TAVR, mitral regurgitation, HFrEF, type 2 diabetes, hypertension, prior CVA, and lumbar radiculopathy who presented with  progressive  dyspnea, fatigue, and postural dizziness and is admitted for symptomatic anemia due to likely GI bleed.  Symptomatic acute blood loss anemia Most likely due to slow chronic GI bleed with persistent decreases in hemoglobin down to 6.7 yesterday and up to 7.0 today from 9 about 2 months ago.  Unclear of melenic stools due to iron  supplement and reported normal colored stools when she is not drinking chocolate Glucerna shakes.  Unconfirmed report of prior upper GI bleed treated with APC.  She has been on aspirin  and Plavix  for the past 2 months after repeat PCI with plans to continue dual antiplatelet therapy for 6 months minimum.  GI consulted  in the ED. - Transfuse 2 units PRBCs, check posttransfusion H&H.  Transfusion if hemoglobin less than 8 - Reticulocyte count, iron  panel, folate - Clear liquid diet - IV PPI twice daily - Hold aspirin  and Plavix  and discuss with cardiology about resuming (last dose this morning) - Hold DVT prophylaxis - Orthostatic vital signs  CAD s/p PCI 06/2023 on DAPT HFrEF Severe aortic stenosis Hypertension Due to cardiology recommending 6 months of uninterrupted DAPT following balloon angioplasty of mid circumflex and stent of the PL branch of the RCA on 06/26/2023 we will consult cardiology.  In addition to this she has been getting outpatient workup for severe aortic stenosis with planned TEE on 09/24/2023.  Discussed with our GI team and card master about potentially doing the TEE in conjunction with EGD/colonoscopy.  No signs of heart failure exacerbation at the moment and she is warm and well-perfused. - Continue spironolactone  12.5 mg daily, Entresto  24-26 mg twice daily, metoprolol  succinate 12.5 mg daily, and atorvastatin  80 mg daily - Clarify maximum time off of DAPT in the setting of symptomatic acute blood loss anemia  Type 2 diabetes Last A1c 5.9 on 05/15/2022.  On metformin alone. - Repeat A1c - Will add SSI if any significant hyperglycemia  during admission  Anxiety/depression Continue home medication of sertraline  50 mg daily.  Chronic back pain and lumbar radiculopathy No increased pain recently. - Continue gabapentin  300 mg 3 times daily and hydrocodone /acetaminophen  5/325 mg every 4 hours as needed for severe pain with Tylenol  500 mg every 8 hours as needed for mild pain  Diet: Clear liquid diet VTE: SCDs Code: Full  Dispo: Admit patient to Inpatient with expected length of stay greater than 2 midnights.  Signed: Fairy Pool, DO Internal Medicine Resident, PGY-3 Please contact the on call pager at [phone] for any urgent or emergent needs. 1:25 PM 09/11/2023

## 2023-09-11 NOTE — ED Notes (Signed)
 Pharmacy messaged regarding missing ferrous sulfate  tablet

## 2023-09-11 NOTE — ED Notes (Signed)
 Phleb messaged regarding lab collection

## 2023-09-11 NOTE — Consult Note (Addendum)
 Consultation  Referring Provider: ERMD/ Dean Primary Care Physician:  Trinidad Hun, MD Primary Gastroenterologist:  Dr.Misenheimer/Mathews  Reason for Consultation: Severe anemia/heme positive stool in setting of Plavix  and aspirin   HPI: Gloria Sharp is a 78 y.o. female with prior history of CVA, on chronic Plavix  and aspirin , diabetes mellitus, hypertension, fibromyalgia, obesity, and coronary artery disease.  Patient had admission in June 2025 with angina and underwent cardiac catheterization with angioplasty to the mid circumflex and stent to the RCA.  She was continued on aspirin  and Plavix . She was just seen by cardiology yesterday/Dr. Wonda in follow-up and with complaints of fatigue, shortness of breath with exertion and heaviness in her chest which will resolve with rest.  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 She is planned for gated CTA and apparently a TEE.  Had labs done through cardiology yesterday showing hemoglobin of 6.7, and then was directed to the emergency room this morning.  Patient does have history of very chronic anemia and reports that she stays on an oral iron  supplement at home.  She has required transfusions in the past but the last was probably 2-1/2 to 3 years ago.  He is known to Dr. Larene Bessie and reports that she had a series of EGDs done 2 to 3 years ago and was found to have oozing blood vessels in her stomach lining which required cauterization.  She has not had capsule endoscopy, her last colonoscopy had been done by Dr. Charlanne in [place] 6 to 7 years ago.  She reports significant diverticulosis, no polyps at that time.  Labs today in the ER with hemoglobin of 7/hematocrit 23.5/MCV 82.5/platelets 411 Pro time 16.7/INR 1.3 Potassium 4.9/BUN 24/creatinine 0.99 LFTs within normal limits Stool for occult  blood positive per ER MD  Reviewing recent labs hemoglobin was 8.5 on 07/22/2023 Hemoglobin 8.8-24 June 2023 Hemoglobin 8.5 25 August 2022  Patient says that she drinks dark chocolate Glucerna which she thinks makes her stools a bit darker, and when she takes her iron  tablets she will notice that her stools are a bit darker but other than that has not noted any overt melena or hematochezia, no regular complaints of abdominal pain occasionally says she will have some mid abdominal discomfort postprandially, no nausea or vomiting, no heartburn or indigestion, she does stay on Protonix  40 mg daily. Occasionally also gets a sharp pain at her umbilicus where she has had prior umbilical hernia repair.  She has also had previous cholecystectomy and inguinal hernia repair.    Past Medical History:  Diagnosis Date   AKI (acute kidney injury) (HCC) 03/27/2021   Angina pectoris (HCC) 05/15/2022   Aortic stenosis 09/04/2019   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 sciatica 02/12/2022   Class 3 obesity 03/26/2021   CVA (cerebral vascular accident) [facility]) 2015?   Cystocele with uterine prolapse 01/17/2018   Diabetes (HCC)    Dizziness 03/27/2021   Dyslipidemia 10/10/2018   Essential hypertension 10/10/2018   Fibromyalgia    GERD (gastroesophageal reflux disease)    Heart murmur 10/10/2018   History of CVA (cerebrovascular accident) 10/10/2018   Hydronephrosis of right kidney 03/26/2021   Hypercholesterolemia    Hypertension    Hypokalemia 03/27/2021   Leukocytosis    Mitral regurgitation 11/24/2018   Nonsustained ventricular tachycardia (HCC) 11/24/2018   Obesity    Post-operative nausea and vomiting  hard to wake up   Rectocele 01/17/2018   Sacroiliitis (HCC) 02/12/2022   Sepsis secondary to UTI (HCC) 03/26/2021   Type 2 diabetes mellitus with hyperglycemia (HCC) 03/26/2021   Type 2 diabetes mellitus without complication, without long-term current use of insulin  (HCC) 10/10/2018     Past Surgical History:  Procedure Laterality Date   ABDOMINAL HYSTERECTOMY     partial, left the ovaries   APPENDECTOMY     BREAST CYST ASPIRATION Right    COLONOSCOPY  11/11/2013   Colonioc polyps status post polypectomy. Pancolonic diverticulosis predominately in the sigmoid colon   CORONARY BALLOON ANGIOPLASTY N/A 06/26/2023   Procedure: CORONARY BALLOON ANGIOPLASTY;  Surgeon: Ladona Heinz, MD;  Location: [facility];  Service: Cardiovascular;  Laterality: N/A;   CYSTOSCOPY W/ URETERAL STENT PLACEMENT Right 03/26/2021   Procedure: CYSTOSCOPY WITH RETROGRADE PYELOGRAM/URETERAL STENT PLACEMENT;  Surgeon: Selma Donnice SAUNDERS, MD;  Location: [facility];  Service: Urology;  Laterality: Right;   CYSTOSCOPY/URETEROSCOPY/HOLMIUM LASER/STENT PLACEMENT Right 04/17/2021   Procedure: CYSTOSCOPY/ RETROGRADE/URETEROSCOPY/HOLMIUM LASER/STENT PLACEMENT;  Surgeon: Selma Donnice SAUNDERS, MD;  Location: [facility];  Service: Urology;  Laterality: Right;  ONLY NEEDS 60 MIN   DILATION AND CURETTAGE OF UTERUS     GALLBLADDER SURGERY Right 07/2014   HERNIA REPAIR  02/20/2021   Umbilical   LEFT HEART CATH AND CORONARY ANGIOGRAPHY N/A 05/16/2022   Procedure: LEFT HEART CATH AND CORONARY ANGIOGRAPHY;  Surgeon: Darron Deatrice LABOR, MD;  Location: [facility];  Service: Cardiovascular;  Laterality: N/A;   RIGHT/LEFT HEART CATH AND CORONARY ANGIOGRAPHY N/A 08/30/2022   Procedure: RIGHT/LEFT HEART CATH AND CORONARY ANGIOGRAPHY;  Surgeon: Wendel Lurena POUR, MD;  Location: [facility];  Service: Cardiovascular;  Laterality: N/A;   RIGHT/LEFT HEART CATH AND CORONARY ANGIOGRAPHY N/A 06/26/2023   Procedure: RIGHT/LEFT HEART CATH AND CORONARY ANGIOGRAPHY;  Surgeon: Ladona Heinz, MD;  Location: [facility];  Service: Cardiovascular;  Laterality: N/A;   TEE WITHOUT CARDIOVERSION N/A 09/03/2022   Procedure: TRANSESOPHAGEAL ECHOCARDIOGRAM;  Surgeon: Gardenia Led, DO;  Location: [facility];  Service:  Cardiovascular;  Laterality: N/A;   vaginal polyp removal      Prior to Admission medications   Medication Sig Start Date End Date Taking? Authorizing Provider  acetaminophen  (TYLENOL ) 650 MG CR tablet Take 1,300 mg by mouth daily. May take an additional 650 as needed for pain   Yes [provider]  aspirin  81 MG chewable tablet Chew 1 tablet (81 mg total) by mouth daily. 06/26/23  Yes Henry Manuelita NOVAK, NP  atorvastatin  (LIPITOR ) 80 MG tablet Take 1 tablet (80 mg total) by mouth daily. 01/31/23  Yes Krasowski, Robert J, MD  brimonidine  (ALPHAGAN ) 0.15 % ophthalmic solution Place 1 drop into both eyes 2 (two) times daily. 08/10/19  Yes [provider]  cholestyramine  (QUESTRAN ) 4 g packet Take 1 packet (4 g total) by mouth daily. Take at least 2 hours before or after rest of the medications Patient taking differently: Take 4 g by mouth daily as needed (diarrhea). 03/25/19  Yes Charlanne Groom, MD  clopidogrel  (PLAVIX ) 75 MG tablet Take 1 tablet (75 mg total) by mouth daily. 05/18/22  Yes Williams, Evan, PA-C  Cyanocobalamin  (VITAMIN B-12 PO) Take 1 tablet by mouth daily in the afternoon.   Yes [provider]  ferrous sulfate  325 (65 FE) MG EC tablet Take 325 mg by mouth daily in the afternoon.   Yes [provider]  gabapentin  (NEURONTIN ) 300 MG capsule  Take 300 mg by mouth 3 (three) times daily.   Yes [provider]  HYDROcodone -acetaminophen  (NORCO/VICODIN) 5-325 MG tablet Take 1 tablet by mouth every 4 (four) hours as needed for severe pain (pain score 7-10) or moderate pain (pain score 4-6). 09/03/23  Yes [provider]  MAGNESIUM  PO Take 1 tablet by mouth at bedtime.   Yes [provider]  metFORMIN (GLUCOPHAGE) 1000 MG tablet Take 1,000 mg by mouth 2 (two) times daily with a meal.   Yes [provider]  metoprolol  succinate (TOPROL  XL) 25 MG 24 hr tablet Take 0.5 tablets (12.5 mg total) by mouth daily. Patient taking  differently: Take 12.5 mg by mouth at bedtime. 06/20/23  Yes Theadore Ozell HERO, MD  nitroGLYCERIN  (NITROSTAT ) 0.4 MG SL tablet Place 1 tablet (0.4 mg total) under the tongue every 5 (five) minutes as needed. Patient taking differently: Place 0.4 mg under the tongue every 5 (five) minutes as needed for chest pain. 06/26/23  Yes Henry Manuelita NOVAK, NP  Omega-3 Fatty Acids (FISH OIL PO) Take 1 capsule by mouth daily in the afternoon.   Yes [provider]  OVER THE COUNTER MEDICATION Apply 1 patch topically daily as needed (back pain). [place] pain patch   Yes [provider]  pantoprazole  (PROTONIX ) 40 MG tablet Take 40 mg by mouth daily.   Yes [provider]  sacubitril -valsartan  (ENTRESTO ) 24-26 MG Take 1 tablet by mouth 2 (two) times daily. 06/21/23  Yes Krasowski, Robert J, MD  sertraline  (ZOLOFT ) 50 MG tablet Take 50 mg by mouth daily.   Yes [provider]  spironolactone  (ALDACTONE ) 25 MG tablet Take 0.5 tablets (12.5 mg total) by mouth daily. 03/04/23  Yes Krasowski, Robert J, MD  TURMERIC PO Take 1 tablet by mouth daily in the afternoon.   Yes [provider]  alendronate (FOSAMAX) 35 MG tablet Take 35 mg by mouth every 7 (seven) days. Patient not taking: Reported on 09/11/2023    [provider]  traMADol (ULTRAM) 50 MG tablet Take 50 mg by mouth every 6 (six) hours as needed for moderate pain (pain score 4-6) or severe pain (pain score 7-10).    [provider]    Current Facility-Administered Medications  Medication Dose Route Frequency Provider Last Rate Last Admin   0.9 %  sodium chloride  infusion (Manually program via Guardrails IV Fluids)   Intravenous Once Haviland, Julie, MD       pantoprazole  (PROTONIX ) injection 40 mg  40 mg Intravenous Q12H Jolaine Pac, DO       Current Outpatient Medications  Medication Sig Dispense Refill   acetaminophen  (TYLENOL ) 650 MG CR tablet Take 1,300 mg by mouth daily. May take an  additional 650 as needed for pain     aspirin  81 MG chewable tablet Chew 1 tablet (81 mg total) by mouth daily. 90 tablet 2   atorvastatin  (LIPITOR ) 80 MG tablet Take 1 tablet (80 mg total) by mouth daily. 90 tablet 3   brimonidine  (ALPHAGAN ) 0.15 % ophthalmic solution Place 1 drop into both eyes 2 (two) times daily.     cholestyramine  (QUESTRAN ) 4 g packet Take 1 packet (4 g total) by mouth daily. Take at least 2 hours before or after rest of the medications (Patient taking differently: Take 4 g by mouth daily as needed (diarrhea).) 30 each 11   clopidogrel  (PLAVIX ) 75 MG tablet Take 1 tablet (75 mg total) by mouth daily. 30 tablet 3   Cyanocobalamin  (VITAMIN B-12 PO)  Take 1 tablet by mouth daily in the afternoon.     ferrous sulfate  325 (65 FE) MG EC tablet Take 325 mg by mouth daily in the afternoon.     gabapentin  (NEURONTIN ) 300 MG capsule Take 300 mg by mouth 3 (three) times daily.     HYDROcodone -acetaminophen  (NORCO/VICODIN) 5-325 MG tablet Take 1 tablet by mouth every 4 (four) hours as needed for severe pain (pain score 7-10) or moderate pain (pain score 4-6).     MAGNESIUM  PO Take 1 tablet by mouth at bedtime.     metFORMIN (GLUCOPHAGE) 1000 MG tablet Take 1,000 mg by mouth 2 (two) times daily with a meal.     metoprolol  succinate (TOPROL  XL) 25 MG 24 hr tablet Take 0.5 tablets (12.5 mg total) by mouth daily. (Patient taking differently: Take 12.5 mg by mouth at bedtime.) 45 tablet 3   nitroGLYCERIN  (NITROSTAT ) 0.4 MG SL tablet Place 1 tablet (0.4 mg total) under the tongue every 5 (five) minutes as needed. (Patient taking differently: Place 0.4 mg under the tongue every 5 (five) minutes as needed for chest pain.) 25 tablet 2   Omega-3 Fatty Acids (FISH OIL PO) Take 1 capsule by mouth daily in the afternoon.     OVER THE COUNTER MEDICATION Apply 1 patch topically daily as needed (back pain). [place] pain patch     pantoprazole  (PROTONIX ) 40 MG tablet Take 40 mg by mouth daily.      sacubitril -valsartan  (ENTRESTO ) 24-26 MG Take 1 tablet by mouth 2 (two) times daily. 180 tablet 1   sertraline  (ZOLOFT ) 50 MG tablet Take 50 mg by mouth daily.     spironolactone  (ALDACTONE ) 25 MG tablet Take 0.5 tablets (12.5 mg total) by mouth daily.     TURMERIC PO Take 1 tablet by mouth daily in the afternoon.     alendronate (FOSAMAX) 35 MG tablet Take 35 mg by mouth every 7 (seven) days. (Patient not taking: Reported on 09/11/2023)     traMADol (ULTRAM) 50 MG tablet Take 50 mg by mouth every 6 (six) hours as needed for moderate pain (pain score 4-6) or severe pain (pain score 7-10).      Allergies as of 09/11/2023 - Review Complete 09/11/2023  Allergen Reaction Noted   Celebrex [celecoxib] Itching and Swelling 03/02/2017   Keflex  [cephalexin ] Itching    Relafen [nabumetone] Itching and Swelling 03/02/2017   Cipro [ciprofloxacin hcl] Hives, Itching, and Rash    Flagyl [metronidazole] Itching, Swelling, and Rash 10/07/2018    Family History  Problem Relation Age of Onset   Cancer Mother    Hypertension Mother    Stroke Mother    Diabetes Mother    Heart failure Mother    Hypertension Father    Heart disease Father    Colon cancer Neg Hx    Esophageal cancer Neg Hx    Stomach cancer Neg Hx    Rectal cancer Neg Hx    Colon polyps Neg Hx     Social History   Socioeconomic History   Marital status: Married    Spouse name: Not on file   Number of children: Not on file   Years of education: Not on file   Highest education level: Not on file  Occupational History   Not on file  Tobacco Use   Smoking status: Never   Smokeless tobacco: Never  Vaping Use   Vaping status: Never Used  Substance and Sexual Activity   Alcohol  use: Not Currently   Drug use:  Never   Sexual activity: Not on file  Other Topics Concern   Not on file  Social History Narrative   Not on file   Social Drivers of Health   Financial Resource Strain: Not on file  Food Insecurity: No Food  Insecurity (05/15/2022)   Hunger Vital Sign    Worried About Running Out of Food in the Last Year: Never true    Ran Out of Food in the Last Year: Never true  Transportation Needs: No Transportation Needs (05/15/2022)   PRAPARE - Administrator, Civil Service (Medical): No    Lack of Transportation (Non-Medical): No  Physical Activity: Not on file  Stress: Not on file  Social Connections: Not on file  Intimate Partner Violence: Not At Risk (05/15/2022)   Humiliation, Afraid, Rape, and Kick questionnaire    Fear of Current or Ex-Partner: No    Emotionally Abused: No    Physically Abused: No    Sexually Abused: No    Review of Systems: Pertinent positive and negative review of systems were noted in the above HPI section.  All other review of systems was otherwise negative.   Physical Exam: Vital signs in last 24 hours: Temp:  [97.7 F (36.5 C)-98 F (36.7 C)] 97.7 F (36.5 C) (08/27 1236) Pulse Rate:  [70-74] 74 (08/27 1236) Resp:  [18-19] 19 (08/27 1236) BP: (131-145)/(58-66) 145/58 (08/27 1236) SpO2:  [98 %-100 %] 100 % (08/27 1235)   General:   Alert,  Well-developed, well-nourished, elderly white female pleasant and cooperative in NAD husband at bedside Head:  Normocephalic and atraumatic. Eyes:  Sclera clear, no icterus.   Conjunctiva pale Ears:  Normal auditory acuity. Nose:  No deformity, discharge,  or lesions. Mouth:  No deformity or lesions.   Neck:  Supple; no masses or thyromegaly. Lungs:  Clear throughout to auscultation.   No wheezes, crackles, or rhonchi.  Heart:  Regular rate and rhythm; prominent systolic murmur  Abdomen:  Soft, obese, nontender, BS active,nonpalp mass or hsm.   Rectal: Done, documented heme positive this a.m. Msk:  Symmetrical without gross deformities. . Pulses:  Normal pulses noted. Extremities:  Without clubbing or edema. Neurologic:  Alert and  oriented x4;  grossly normal neurologically. Skin:  Intact without significant  lesions or rashes.. Psych:  Alert and cooperative. Normal mood and affect.  Intake/Output from previous day: No intake/output data recorded. Intake/Output this shift: No intake/output data recorded.  Lab Results: Recent Labs    09/10/23 1300 09/11/23 0941  WBC 8.6 8.4  HGB 6.7* 7.0*  HCT 23.5* 23.5*  PLT 399 411*   BMET Recent Labs    09/10/23 1300 09/11/23 0941  NA 140 137  K 5.7* 4.9  CL 108* 109  CO2 19* 20*  GLUCOSE 110* 126*  BUN 28* 24*  CREATININE 1.02* 0.99  CALCIUM  9.4 8.8*   LFT Recent Labs    09/11/23 0941  PROT 6.1*  ALBUMIN 3.3*  AST 17  ALT 13  ALKPHOS 76  BILITOT 0.6   PT/INR Recent Labs    09/11/23 0941  LABPROT 16.7*  INR 1.3*    IMPRESSION:  #28 78 year old white female with history of chronic anemia, with gradual decline in hemoglobin of about 2 g over the past couple of months, heme positive in the setting of aspirin  and Plavix .  Outpatient hemoglobin yesterday 6.7 and directed to the ER, repeat hemoglobin here 7.0. 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 Larene in [place] with a series of EGDs with cauterization 2 to 3 years ago.  Suspect she has intermittent subacute GI blood loss possibly from recurrent AVMs, chronic gastropathy etc.  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  We will need to discuss with cardiology regarding ability to hold Plavix  for 3 to 4 days in setting of very recent stent, and can coordinate timing of procedures based on that conversation. GI will follow with you.   Amy Esterwood PA-C 09/11/2023, 12:43 PM    ATTENDING ADDENDUM 78 y/o female with CAD with recent stent placed in June on aspirin  and Plavix , severe AS, severe MR - admitted to the hospital with symptomatic anemia. Hgb 7. Stool occult blood positive, dark stools, on iron  chronically. History of upper GI bleeding in the past, lat EGD a few years ago where she reported some cauterization of vessels. Colonoscopy done 6-7 years ago reportedly.   Discussed anemia, IDA, I suspected more than upper tract bleeding given her history. Offered her an EGD / enteroscopy to further evaluate. I think this can be done in the setting of recent Plavix  use to further evaluate and treat endoscopically if needed, as opposed to waiting a full 5 days for Plavix  washout in light of her recent stenting, at risk for in stent thrombosis if we do this. In this light, I offered her an EGD tomorrow. Discussed risks / benefits, they understand and want to proceed. Would hold Plavix  for now, continue IV PPI, give PRBC transfusion today, and plan for EGD tomorrow. Clear liquid diet okay now, but make NPO after MN.   Call with questions or changes in her status in the interim.  Marcey Naval, MD [facility]

## 2023-09-11 NOTE — ED Provider Notes (Signed)
 [place] [facility] AT [facility] Provider Note   CSN: [id number] Arrival date & time: 09/11/23  9066     Patient presents with: Abnormal Lab   Gloria Sharp is a 78 y.o. female.   Pt is a 78 yo female with pmhx significant for GERD, HLD, HTN, DM2, Fibromyalgia, Obesity, CVA, HTN, NSVT, sciatica, hx GIB.  Pt presents to the ED today with anemia.  Pt's hgb has been down trending for a few months.  She had a hgb of 7.4 on 7/30 and was told to f/u with pcp.  Yesterday, she had an office visit with her cardiologist and hgb was 6.7.  She was sent to the ED for a blood transfusion and to see GI.  Pt has seen GI in the past (Dr. Larene in [place]) and was told that her stomach was bleeding.  They had to cauterize bleeders.  She has seen black stool at times, but does take iron .        Prior to Admission medications   Medication Sig Start Date End Date Taking? Authorizing Provider  acetaminophen  (TYLENOL ) 650 MG CR tablet Take 650 mg by mouth daily as needed for pain.    [provider]  alendronate (FOSAMAX) 35 MG tablet Take 35 mg by mouth every 7 (seven) days.    [provider]  aspirin  81 MG chewable tablet Chew 1 tablet (81 mg total) by mouth daily. 06/26/23   Henry Manuelita NOVAK, NP  atorvastatin  (LIPITOR ) 80 MG tablet Take 1 tablet (80 mg total) by mouth daily. 01/31/23   Krasowski, Robert J, MD  brimonidine  (ALPHAGAN ) 0.15 % ophthalmic solution Place 1 drop into both eyes 2 (two) times daily. 08/10/19   [provider]  cholestyramine  (QUESTRAN ) 4 g packet Take 1 packet (4 g total) by mouth daily. Take at least 2 hours before or after rest of the medications 03/25/19   Charlanne Groom, MD  clopidogrel  (PLAVIX ) 75 MG tablet Take 1 tablet (75 mg total) by mouth daily. 05/18/22   Williams, Evan, PA-C  Cyanocobalamin  (VITAMIN B-12 PO) Take 1 tablet by mouth daily in the afternoon.    [provider]  diclofenac  Sodium (VOLTAREN ) 1 %  GEL Apply 2 g topically at bedtime as needed (knee pain).    [provider]  fenofibrate  (TRICOR ) 145 MG tablet Take 145 mg by mouth daily.    [provider]  ferrous sulfate  325 (65 FE) MG EC tablet Take 325 mg by mouth daily in the afternoon.    [provider]  gabapentin  (NEURONTIN ) 300 MG capsule Take 300 mg by mouth 3 (three) times daily.    [provider]  HYDROcodone -acetaminophen  (NORCO/VICODIN) 5-325 MG tablet Take 1 tablet by mouth every 4 (four) hours as needed. 09/03/23   [provider]  Infant [employer] [facility]) OINT Apply 1 application  topically 2 (two) times daily as needed (itching).    [provider]  MAGNESIUM  PO Take 1 tablet by mouth daily in the afternoon.    [provider]  metFORMIN (GLUCOPHAGE) 1000 MG tablet Take 1,000 mg by mouth 2 (two) times daily with a meal.    [provider]  metoprolol  succinate (TOPROL  XL) 25 MG 24 hr tablet Take 0.5 tablets (12.5 mg total) by mouth daily. 06/20/23   Theadore Ozell HERO, MD  nitroGLYCERIN  (NITROSTAT ) 0.4 MG SL tablet Place 1 tablet (0.4 mg total) under the tongue every 5 (five) minutes as needed. 06/26/23  Henry Manuelita NOVAK, NP  Omega-3 Fatty Acids (FISH OIL PO) Take 1 capsule by mouth daily in the afternoon.    [provider]  OVER THE COUNTER MEDICATION Apply 1 patch topically daily as needed (back pain). Coralite pain patch    [provider]  pantoprazole  (PROTONIX ) 40 MG tablet Take 40 mg by mouth daily.    [provider]  sacubitril -valsartan  (ENTRESTO ) 24-26 MG Take 1 tablet by mouth 2 (two) times daily. 06/21/23   Krasowski, Robert J, MD  sertraline  (ZOLOFT ) 50 MG tablet Take 50 mg by mouth daily.    [provider]  spironolactone  (ALDACTONE ) 25 MG tablet Take 0.5 tablets (12.5 mg total) by mouth daily. 03/04/23   Krasowski, Robert J, MD  tiZANidine  (ZANAFLEX ) 2 MG tablet Take 2 mg by mouth every 6 (six) hours  as needed for muscle spasms.    [provider]  TURMERIC PO Take 1 tablet by mouth daily in the afternoon.    [provider]    Allergies: Celebrex [celecoxib], Keflex  [cephalexin ], Relafen [nabumetone], Cipro [ciprofloxacin hcl], and Flagyl [metronidazole]    Review of Systems  Respiratory:  Positive for shortness of breath.   All other systems reviewed and are negative.   Updated Vital Signs BP 131/66 (BP Location: Left Arm)   Pulse 70   Temp 98 F (36.7 C) (Oral)   Resp 18   SpO2 98%   Physical Exam Vitals and nursing note reviewed.  Constitutional:      Appearance: Normal appearance.  HENT:     Head: Normocephalic and atraumatic.     Right Ear: External ear normal.     Left Ear: External ear normal.     Nose: Nose normal.     Mouth/Throat:     Mouth: Mucous membranes are moist.     Pharynx: Oropharynx is clear.  Eyes:     Extraocular Movements: Extraocular movements intact.     Conjunctiva/sclera: Conjunctivae normal.     Pupils: Pupils are equal, round, and reactive to light.  Cardiovascular:     Rate and Rhythm: Normal rate and regular rhythm.     Pulses: Normal pulses.     Heart sounds: Normal heart sounds.  Pulmonary:     Effort: Pulmonary effort is normal.     Breath sounds: Normal breath sounds.  Abdominal:     General: Abdomen is flat. Bowel sounds are normal.     Palpations: Abdomen is soft.  Genitourinary:    Rectum: Guaiac result positive.  Musculoskeletal:        General: Normal range of motion.     Cervical back: Normal range of motion and neck supple.  Skin:    General: Skin is warm.     Capillary Refill: Capillary refill takes less than 2 seconds.  Neurological:     General: No focal deficit present.     Mental Status: She is alert and oriented to person, place, and time.  Psychiatric:        Mood and Affect: Mood normal.        Behavior: Behavior normal.        Thought Content: Thought content normal.        Judgment:  Judgment normal.     (all labs ordered are listed, but only abnormal results are displayed) Labs Reviewed  COMPREHENSIVE METABOLIC PANEL WITH GFR - Abnormal; Notable for the following components:      Result Value   CO2 20 (*)    Glucose,  Bld 126 (*)    BUN 24 (*)    Calcium  8.8 (*)    Total Protein 6.1 (*)    Albumin 3.3 (*)    GFR, Estimated 59 (*)    All other components within normal limits  CBC WITH DIFFERENTIAL/PLATELET - Abnormal; Notable for the following components:   RBC 2.85 (*)    Hemoglobin 7.0 (*)    HCT 23.5 (*)    MCH 24.6 (*)    MCHC 29.8 (*)    Platelets 411 (*)    All other components within normal limits  PROTIME-INR - Abnormal; Notable for the following components:   Prothrombin Time 16.7 (*)    INR 1.3 (*)    All other components within normal limits  POC OCCULT BLOOD, ED - Abnormal; Notable for the following components:   Fecal Occult Bld POSITIVE (*)    All other components within normal limits  TYPE AND SCREEN  ABO/RH  PREPARE RBC (CROSSMATCH)    EKG: EKG Interpretation Date/Time:  Wednesday September 11 2023 10:58:22 EDT Ventricular Rate:  71 PR Interval:  149 QRS Duration:  138 QT Interval:  397 QTC Calculation: 432 R Axis:   75  Text Interpretation: Sinus rhythm Left bundle branch block No significant change since last tracing Confirmed by Dean Clarity [phone]) on 09/11/2023 11:45:14 AM  Radiology: [facility] Chest 1 View Result Date: 09/11/2023 CLINICAL DATA:  Gastrointestinal bleeding. Ongoing weakness and shortness of breath. EXAM: CHEST  1 VIEW COMPARISON:  08/14/2023 FINDINGS: Stable enlarged cardiac silhouette. Decreased inspiration with clear lungs and normal vascularity. Interval mild-to-moderate peribronchial thickening. Interval faint nodular opacity in the right mid lung zone which appears to represent overlapping ribs and vessels. No acute bony abnormality. IMPRESSION: 1. Interval mild to moderate bronchitic changes. 2. Stable  cardiomegaly. 3. Interval faint nodular opacity in the right mid lung zone which appears to represent overlapping ribs and vessels. Recommend follow-up PA and lateral chest radiographs in 3-4 weeks. Electronically Signed   By: Elspeth Bathe M.D.   On: 09/11/2023 10:36     Procedures   Medications Ordered in the ED  0.9 %  sodium chloride  infusion (Manually program via Guardrails IV Fluids) (has no administration in time range)  pantoprazole  (PROTONIX ) injection 40 mg (40 mg Intravenous Given 09/11/23 1121)                                    Medical Decision Making Amount and/or Complexity of Data Reviewed Labs: ordered. Radiology: ordered.  Risk Prescription drug management. Decision regarding hospitalization.   This patient presents to the ED for concern of anemia, this involves an extensive number of treatment options, and is a complaint that carries with it a high risk of complications and morbidity.  The differential diagnosis includes anemia, gi bleed   Co morbidities that complicate the patient evaluation  GERD, HLD, HTN, DM2, Fibromyalgia, Obesity, CVA, HTN, NSVT, sciatica, hx GIB   Additional history obtained:  Additional history obtained from epic chart review External records from outside source obtained and reviewed including husband   Lab Tests:  I Ordered, and personally interpreted labs.  The pertinent results include:  cbc with hgb 7 (7.4 on 7/30 and 9.2 on 6/11); inr 1.3; cmp with bun sl elevated at 24   Imaging Studies ordered:  I ordered imaging studies including cxr  I independently visualized and interpreted imaging which showed   Interval mild  to moderate bronchitic changes.  2. Stable cardiomegaly.  3. Interval faint nodular opacity in the right mid lung zone which  appears to represent overlapping ribs and vessels. Recommend  follow-up PA and lateral chest radiographs in 3-4 weeks.   I agree with the radiologist interpretation   Cardiac  Monitoring:  The patient was maintained on a cardiac monitor.  I personally viewed and interpreted the cardiac monitored which showed an underlying rhythm of: nsr   Medicines ordered and prescription drug management:  I ordered medication including protonix   for sx  Reevaluation of the patient after these medicines showed that the patient improved I have reviewed the patients home medicines and have made adjustments as needed   Test Considered:  ct   Critical Interventions:  transfusion   Consultations Obtained:  I requested consultation with [facility] (Amy Esterwood),  and discussed lab and imaging findings as well as pertinent plan - she will see pt Pt d/w IMTS for admission.   Problem List / ED Course:  Upper GI bleed:  hgb has been down trending for 2 months.  Stool is guaiac +.  Pt is symptomatic from her anemia.  1 unit blood for transfusion has been ordered.   Reevaluation:  After the interventions noted above, I reevaluated the patient and found that they have :improved   Social Determinants of Health:  Lives at home   Dispostion:  After consideration of the diagnostic results and the patients response to treatment, I feel that the patent would benefit from admission.  CRITICAL CARE Performed by: Mliss Boyers   Total critical care time: 30 minutes  Critical care time was exclusive of separately billable procedures and treating other patients.  Critical care was necessary to treat or prevent imminent or life-threatening deterioration.  Critical care was time spent personally by me on the following activities: development of treatment plan with patient and/or surrogate as well as nursing, discussions with consultants, evaluation of patient's response to treatment, examination of patient, obtaining history from patient or surrogate, ordering and performing treatments and interventions, ordering and review of laboratory studies, ordering and review of  radiographic studies, pulse oximetry and re-evaluation of patient's condition.        Final diagnoses:  Symptomatic anemia  Upper GI bleed    ED Discharge Orders     None          Boyers Mliss, MD 09/11/23 1146

## 2023-09-11 NOTE — ED Notes (Signed)
 [facility] called

## 2023-09-11 NOTE — ED Notes (Signed)
 This paramedic sat with patient for first 15 minutes of transfusion with no complications. GI now at bedside.

## 2023-09-12 ENCOUNTER — Telehealth (HOSPITAL_COMMUNITY): Payer: Self-pay | Admitting: Pharmacy Technician

## 2023-09-12 ENCOUNTER — Inpatient Hospital Stay (HOSPITAL_COMMUNITY): Admitting: Anesthesiology

## 2023-09-12 ENCOUNTER — Other Ambulatory Visit (HOSPITAL_COMMUNITY): Payer: Self-pay | Admitting: Internal Medicine

## 2023-09-12 ENCOUNTER — Telehealth (HOSPITAL_COMMUNITY): Payer: Self-pay | Admitting: Internal Medicine

## 2023-09-12 ENCOUNTER — Encounter (HOSPITAL_COMMUNITY): Payer: Self-pay | Admitting: Internal Medicine

## 2023-09-12 ENCOUNTER — Encounter (HOSPITAL_COMMUNITY)
Admission: EM | Disposition: A | Discharge status: Home / Self Care | Payer: Self-pay | Source: Ambulatory Visit | Attending: Internal Medicine

## 2023-09-12 DIAGNOSIS — D131 Benign neoplasm of stomach: Secondary | ICD-10-CM | POA: Diagnosis not present

## 2023-09-12 DIAGNOSIS — F419 Anxiety disorder, unspecified: Secondary | ICD-10-CM

## 2023-09-12 DIAGNOSIS — I2510 Atherosclerotic heart disease of native coronary artery without angina pectoris: Secondary | ICD-10-CM | POA: Diagnosis not present

## 2023-09-12 DIAGNOSIS — K317 Polyp of stomach and duodenum: Secondary | ICD-10-CM

## 2023-09-12 DIAGNOSIS — K31819 Angiodysplasia of stomach and duodenum without bleeding: Secondary | ICD-10-CM

## 2023-09-12 DIAGNOSIS — I340 Nonrheumatic mitral (valve) insufficiency: Secondary | ICD-10-CM

## 2023-09-12 DIAGNOSIS — G8929 Other chronic pain: Secondary | ICD-10-CM

## 2023-09-12 DIAGNOSIS — D649 Anemia, unspecified: Secondary | ICD-10-CM

## 2023-09-12 DIAGNOSIS — Z7982 Long term (current) use of aspirin: Secondary | ICD-10-CM

## 2023-09-12 DIAGNOSIS — Z79899 Other long term (current) drug therapy: Secondary | ICD-10-CM

## 2023-09-12 DIAGNOSIS — I110 Hypertensive heart disease with heart failure: Secondary | ICD-10-CM

## 2023-09-12 DIAGNOSIS — E119 Type 2 diabetes mellitus without complications: Secondary | ICD-10-CM

## 2023-09-12 DIAGNOSIS — M5416 Radiculopathy, lumbar region: Secondary | ICD-10-CM

## 2023-09-12 DIAGNOSIS — I5022 Chronic systolic (congestive) heart failure: Secondary | ICD-10-CM

## 2023-09-12 DIAGNOSIS — F32A Depression, unspecified: Secondary | ICD-10-CM

## 2023-09-12 DIAGNOSIS — D62 Acute posthemorrhagic anemia: Secondary | ICD-10-CM

## 2023-09-12 HISTORY — PX: ENTEROSCOPY: SHX5533

## 2023-09-12 LAB — BASIC METABOLIC PANEL WITH GFR
Anion gap: 9 (ref 5–15)
BUN: 16 mg/dL (ref 8–23)
CO2: 22 mmol/L (ref 22–32)
Calcium: 8.9 mg/dL (ref 8.9–10.3)
Chloride: 108 mmol/L (ref 98–111)
Creatinine, Ser: 0.95 mg/dL (ref 0.44–1.00)
GFR, Estimated: >60 mL/min (ref >60)
Glucose, Bld: 88 mg/dL (ref 70–99)
Potassium: 4.3 mmol/L (ref 3.5–5.1)
Sodium: 139 mmol/L (ref 135–145)

## 2023-09-12 LAB — TYPE AND SCREEN
ABO/RH(D): O POS
Antibody Screen: NEGATIVE
Unit division: 0
Unit division: 0

## 2023-09-12 LAB — HEMOGLOBIN A1C
Hgb A1c MFr Bld: 6.6 % — ABNORMAL HIGH (ref 4.8–5.6)
Mean Plasma Glucose: 143 mg/dL

## 2023-09-12 LAB — BPAM RBC
Blood Product Expiration Date: 202509192359
Blood Product Expiration Date: 202509192359
ISSUE DATE / TIME: 202508271230
ISSUE DATE / TIME: 202508271524
Unit Type and Rh: 5100
Unit Type and Rh: 5100

## 2023-09-12 LAB — CBC
HCT: 28.4 % — ABNORMAL LOW (ref 36.0–46.0)
Hemoglobin: 9 g/dL — ABNORMAL LOW (ref 12.0–15.0)
MCH: 25.7 pg — ABNORMAL LOW (ref 26.0–34.0)
MCHC: 31.7 g/dL (ref 30.0–36.0)
MCV: 81.1 fL (ref 80.0–100.0)
Platelets: 374 K/uL (ref 150–400)
RBC: 3.5 MIL/uL — ABNORMAL LOW (ref 3.87–5.11)
RDW: 14.9 % (ref 11.5–15.5)
WBC: 9.9 K/uL (ref 4.0–10.5)
nRBC: 0 % (ref 0.0–0.2)

## 2023-09-12 LAB — HEMOGLOBIN AND HEMATOCRIT, BLOOD
HCT: 26.7 % — ABNORMAL LOW (ref 36.0–46.0)
Hemoglobin: 8.3 g/dL — ABNORMAL LOW (ref 12.0–15.0)

## 2023-09-12 SURGERY — ENTEROSCOPY
Anesthesia: Monitor Anesthesia Care

## 2023-09-12 MED ORDER — VITAMIN B-12 1000 MCG PO TABS
1000.0000 ug | ORAL_TABLET | Freq: Every day | ORAL | Status: DC
  Administered 2023-09-12 – 2023-09-13 (×2): 1000 ug via ORAL
  Filled 2023-09-12 (×2): qty 1

## 2023-09-12 MED ORDER — IRON SUCROSE 200 MG IVPB - SIMPLE MED: 200.0000 mg | Status: DC

## 2023-09-12 MED ORDER — LIDOCAINE 2% (20 MG/ML) 5 ML SYRINGE
INTRAMUSCULAR | Status: DC | PRN
  Administered 2023-09-12: 60 mg via INTRAVENOUS

## 2023-09-12 MED ORDER — PHENYLEPHRINE 80 MCG/ML (10ML) SYRINGE FOR IV PUSH (FOR BLOOD PRESSURE SUPPORT)
PREFILLED_SYRINGE | INTRAVENOUS | Status: DC | PRN
  Administered 2023-09-12: 160 ug via INTRAVENOUS

## 2023-09-12 MED ORDER — SODIUM CHLORIDE 0.9 % IV SOLN
200.0000 mg | Freq: Once | INTRAVENOUS | Status: AC
  Administered 2023-09-12: 200 mg via INTRAVENOUS
  Filled 2023-09-12: qty 10

## 2023-09-12 MED ORDER — SODIUM CHLORIDE 0.9 % IV SOLN
INTRAVENOUS | Status: AC | PRN
  Administered 2023-09-12: 500 mL via INTRAVENOUS

## 2023-09-12 MED ORDER — PROPOFOL 10 MG/ML IV BOLUS
INTRAVENOUS | Status: DC | PRN
  Administered 2023-09-12 (×2): 20 mg via INTRAVENOUS
  Administered 2023-09-12: 40 mg via INTRAVENOUS
  Administered 2023-09-12: 125 ug/kg/min via INTRAVENOUS
  Administered 2023-09-12: 20 mg via INTRAVENOUS

## 2023-09-12 MED ORDER — SUCRALFATE 1 G PO TABS
1.0000 g | ORAL_TABLET | Freq: Three times a day (TID) | ORAL | Status: DC
  Administered 2023-09-12 – 2023-09-13 (×4): 1 g via ORAL
  Filled 2023-09-12 (×6): qty 1

## 2023-09-12 MED ORDER — ASPIRIN 81 MG PO TBEC
81.0000 mg | DELAYED_RELEASE_TABLET | Freq: Every day | ORAL | Status: DC
  Administered 2023-09-12: 81 mg via ORAL
  Filled 2023-09-12: qty 1

## 2023-09-12 MED ORDER — CLOPIDOGREL BISULFATE 75 MG PO TABS
75.0000 mg | ORAL_TABLET | Freq: Every day | ORAL | Status: DC
  Administered 2023-09-12: 75 mg via ORAL
  Filled 2023-09-12: qty 1

## 2023-09-12 MED ORDER — IRON SUCROSE 200 MG IVPB - SIMPLE MED
200.0000 mg | Freq: Once | Status: DC
  Filled 2023-09-12: qty 110

## 2023-09-12 MED ORDER — MUSCLE RUB 10-15 % EX CREA: TOPICAL_CREAM | CUTANEOUS | Status: DC | PRN

## 2023-09-12 NOTE — Evaluation (Signed)
 Physical Therapy Evaluation & Discharge Patient Details Name: Gloria Sharp MRN: [id number] DOB: 1945-05-23 Today's Date: 09/12/2023  History of Present Illness  Pt is 78 y.o. female who presented 09/11/23 from her cardiologist's office after labs showed her hemoglobin was down to 6.7 after presenting with progressive fatigue, DOE, and postural dizziness without syncope for the past several weeks. Admitted with acute blood loss anemia. S/p EGD 8/28. PMH - chronic/rt leg pain, DM2, HTN, CVA, aortic stenosis undergoing evaluation for TAVR, mitral regurgitation, HFrEF, fibromyalgia, IBS, asthma, CAD s/p PCI 06/2023, lumbar radiculopathy, angina pectoris   Clinical Impression  Pt presents with condition above. PTA, she was mod I for functional mobility, utilizing her RW and intermittently her cane inside her home but her rollator outside. She was living with her husband and grandson in a 1-level house with a ramped entrance. Currently, the pt reports and appears to be functioning at her baseline. She was able to transfer to stand and ambulate without LOB or assistance while utilizing a RW for stability. She reports chronic back and radicular R leg pain that impacts her activity tolerance and posture when standing. She reports her back impacts her strength and sensation in her R leg. Pt reports she is already scheduled to start OPPT to manage her chronic back and R leg pain, which she could benefit from. No acute PT needs identified. All education completed and questions answered. PT will sign off. Thank you for this referral.       If plan is discharge home, recommend the following: Assistance with cooking/housework   Can travel by private vehicle        Equipment Recommendations None recommended by PT  Recommendations for Other Services       Functional Status Assessment Patient has not had a recent decline in their functional status     Precautions / Restrictions Precautions Precautions:  Fall Restrictions Weight Bearing Restrictions Per Provider Order: No      Mobility  Bed Mobility               General bed mobility comments: Pt sitting in recliner upon arrival. Ambulating with OT end of session.    Transfers Overall transfer level: Modified independent Equipment used: Rolling walker (2 wheels)               General transfer comment: Slow to power up to stand with use of UEs to push up and steady self, but no LOB or assistance needed.    Ambulation/Gait Ambulation/Gait assistance: Supervision Gait Distance (Feet): 110 Feet Assistive device: Rolling walker (2 wheels) Gait Pattern/deviations: Step-through pattern, Decreased step length - right, Decreased step length - left, Decreased stride length, Trunk flexed Gait velocity: reduced Gait velocity interpretation: <1.8 ft/sec, indicate of risk for recurrent falls   General Gait Details: Pt ambulates slowly but steadily with small, slow steps and a flexed posture. Pt able to momentarily correct her posture when cued but returns to a flexed posture a moment later due to back pain. Supervision for safety  Stairs            Wheelchair Mobility     Tilt Bed    Modified Rankin (Stroke Patients Only)       Balance Overall balance assessment: Needs assistance Sitting-balance support: No upper extremity supported, Feet supported Sitting balance-Leahy Scale: Good Sitting balance - Comments: Able to reach off COG to manage sock without LOB or assistance, limited in reach by back pain though   Standing balance support:  Bilateral upper extremity supported, During functional activity, No upper extremity supported Standing balance-Leahy Scale: Fair Standing balance comment: Able to stand statically without UE support but reliant on RW to ambulate                             Pertinent Vitals/Pain Pain Assessment Pain Assessment: Faces Faces Pain Scale: Hurts even more Pain Location:  chronic back and R leg pain Pain Descriptors / Indicators: Discomfort, Grimacing, Guarding, Shooting, Sore, Numbness, Burning Pain Intervention(s): Limited activity within patient's tolerance, Monitored during session, Repositioned    Home Living Family/patient expects to be discharged to:: Private residence Living Arrangements: Spouse/significant other;Other relatives (grandson) Available Help at Discharge: Family;Available 24 hours/day Type of Home: House Home Access: Ramped entrance       Home Layout: One level Home Equipment: Agricultural consultant (2 wheels);Rollator (4 wheels);Cane - single point;BSC/3in1;Shower seat;Transport chair      Prior Function Prior Level of Function : Independent/Modified Independent;Driving             Mobility Comments: Uses RW and intermittently cane inside, uses rollator outside; fell x2 weeks ago in the shower when shower chair went out from under her but otherwise no other falls in the past 6 months ADLs Comments: Mod I using shower chair for bathing     Extremity/Trunk Assessment   Upper Extremity Assessment Upper Extremity Assessment: Defer to OT evaluation    Lower Extremity Assessment Lower Extremity Assessment: RLE deficits/detail;Generalized weakness RLE Deficits / Details: Pt reports hx of R leg numbness and shooting/burning nerve pain from her back; MMT score of 4/5 quads (5/5 on L for comparison), and 4+/5 anterior tibialis bil RLE Sensation: decreased light touch    Cervical / Trunk Assessment Cervical / Trunk Assessment: Kyphotic  Communication   Communication Communication: Impaired Factors Affecting Communication: Hearing impaired    Cognition Arousal: Alert Behavior During Therapy: WFL for tasks assessed/performed   PT - Cognitive impairments: No apparent impairments                         Following commands: Intact       Cueing Cueing Techniques: Verbal cues     General Comments General comments  (skin integrity, edema, etc.): BP 135/61 sitting, 163/82 standing    Exercises     Assessment/Plan    PT Assessment All further PT needs can be met in the next venue of care  PT Problem List Decreased strength;Decreased activity tolerance;Decreased balance;Decreased mobility;Impaired sensation;Pain       PT Treatment Interventions      PT Goals (Current goals can be found in the Care Plan section)  Acute Rehab PT Goals Patient Stated Goal: to improve her back and R leg pain PT Goal Formulation: All assessment and education complete, DC therapy Time For Goal Achievement: 09/13/23 Potential to Achieve Goals: Good    Frequency       Co-evaluation               AM-PAC PT 6 Clicks Mobility  Outcome Measure Help needed turning from your back to your side while in a flat bed without using bedrails?: None Help needed moving from lying on your back to sitting on the side of a flat bed without using bedrails?: None Help needed moving to and from a bed to a chair (including a wheelchair)?: None Help needed standing up from a chair using your arms (e.g., wheelchair  or bedside chair)?: None Help needed to walk in hospital room?: A Little Help needed climbing 3-5 steps with a railing? : A Little 6 Click Score: 22    End of Session   Activity Tolerance: Patient tolerated treatment well;Patient limited by pain Patient left: Other (comment) (ambulating back into room with OT)   PT Visit Diagnosis: Unsteadiness on feet (R26.81);Other abnormalities of gait and mobility (R26.89);Muscle weakness (generalized) (M62.81);Pain;Difficulty in walking, not elsewhere classified (R26.2) Pain - Right/Left: Right Pain - part of body: Leg (and back)    Time: 1147-1200 PT Time Calculation (min) (ACUTE ONLY): 13 min   Charges:   PT Evaluation $PT Eval Low Complexity: 1 Low   PT General Charges $$ ACUTE PT VISIT: 1 Visit         Theo Ferretti, PT, DPT Acute Rehabilitation  Services  Office: [phone]   Theo CHRISTELLA Ferretti 09/12/2023, 12:12 PM

## 2023-09-12 NOTE — Transfer of Care (Signed)
 Immediate Anesthesia Transfer of Care Note  Patient: Gloria Sharp  Procedure(s) Performed: ENTEROSCOPY  Patient Location: PACU  Anesthesia Type:MAC  Level of Consciousness: drowsy  Airway & Oxygen  Therapy: Patient Spontanous Breathing  Post-op Assessment: Report given to RN and Post -op Vital signs reviewed and stable  Post vital signs: Reviewed and stable  Last Vitals:  Vitals Value Taken Time  BP 125/66 09/12/23 09:14  Temp 36.1 C 09/12/23 09:14  Pulse 86 09/12/23 09:17  Resp 20 09/12/23 09:17  SpO2 97 % 09/12/23 09:17  Vitals shown include unfiled device data.  Last Pain:  Vitals:   09/12/23 0914  TempSrc: Temporal  PainSc: 0-No pain      Patients Stated Pain Goal: 7 (09/12/23 0801)  Complications: There were no known notable events for this encounter.

## 2023-09-12 NOTE — Progress Notes (Signed)
 PT Cancellation Note  Patient Details Name: [name] MRN: [id number] DOB: 04/24/1945   Cancelled Treatment:    Reason Eval/Treat Not Completed: (P) Patient at procedure or test/unavailable. Will plan to follow-up later as time permits.   Theo Ferretti, PT, DPT Acute Rehabilitation Services  Office: [phone]    Theo CHRISTELLA Ferretti 09/12/2023, 8:47 AM

## 2023-09-12 NOTE — Telephone Encounter (Signed)
 Auth Submission: NO AUTH NEEDED Site of care: [facility] Payer: [employer] Medicare Medication & CPT/J Code(s) submitted: Venofer  (Iron  Sucrose) J1756 Diagnosis Code: D50.0 Route of submission (phone, fax, portal):  Phone # Fax # Auth type: Buy/Bill HB Units/visits requested: 200mg  x 2 doses Reference number:  Approval from: 09/12/23 to 01/15/24    Dagoberto Armour, CPhT [facility] Phone: [phone] 09/12/2023

## 2023-09-12 NOTE — Anesthesia Preprocedure Evaluation (Addendum)
 Anesthesia Evaluation  Patient identified by MRN, date of birth, ID band Patient awake    Reviewed: Allergy & Precautions, NPO status , Patient's Chart, lab work & pertinent test results, reviewed documented beta blocker date and time   History of Anesthesia Complications (+) PONV and history of anesthetic complications  Airway Mallampati: II  TM Distance: >3 FB Neck ROM: Full    Dental  (+) Dental Advisory Given, Teeth Intact   Pulmonary neg pulmonary ROS   Pulmonary exam normal breath sounds clear to auscultation       Cardiovascular hypertension, Pt. on home beta blockers and Pt. on medications + angina  + CAD and +CHF  Normal cardiovascular exam+ Valvular Problems/Murmurs (severe MR) MR and AS  Rhythm:Regular Rate:Normal  TTE 2025 1. Left ventricular ejection fraction, by estimation, is 60 to 65%. The  left ventricle has normal function. The left ventricle has no regional  wall motion abnormalities. Left ventricular diastolic parameters are  consistent with Grade I diastolic  dysfunction (impaired relaxation). The average left ventricular global  longitudinal strain is -9.6 %. The global longitudinal strain is abnormal.   2. Right ventricular systolic function is normal. The right ventricular  size is normal. There is mildly elevated pulmonary artery systolic  pressure.   3. Left atrial size was moderately dilated.   4. The mitral valve is normal in structure. Severe mitral valve  regurgitation. No evidence of mitral stenosis.   5. The aortic valve is normal in structure. Aortic valve regurgitation is  not visualized. No aortic stenosis is present.   6. The inferior vena cava is normal in size with greater than 50%  respiratory variability, suggesting right atrial pressure of 3 mmHg.     Neuro/Psych CVA, No Residual Symptoms  negative psych ROS   GI/Hepatic Neg liver ROS,GERD  ,,  Endo/Other  diabetes, Type 2, Oral  Hypoglycemic Agents  Class 3 obesity (BMI 43)  Renal/GU negative Renal ROS  negative genitourinary   Musculoskeletal  (+)  Fibromyalgia -  Abdominal   Peds  Hematology  (+) Blood dyscrasia (plavix ), anemia Lab Results      Component                Value               Date                      WBC                      9.9                 09/12/2023                HGB                      9.0 (L)             09/12/2023                HCT                      28.4 (L)            09/12/2023                MCV                      81.1  09/12/2023                PLT                      374                 09/12/2023              Anesthesia Other Findings   Reproductive/Obstetrics                              Anesthesia Physical Anesthesia Plan  ASA: 4  Anesthesia Plan: MAC   Post-op Pain Management:    Induction: Intravenous  PONV Risk Score and Plan: Propofol  infusion and Treatment may vary due to age or medical condition  Airway Management Planned: Natural Airway  Additional Equipment:   Intra-op Plan:   Post-operative Plan:   Informed Consent: I have reviewed the patients History and Physical, chart, labs and discussed the procedure including the risks, benefits and alternatives for the proposed anesthesia with the patient or authorized representative who has indicated his/her understanding and acceptance.     Dental advisory given  Plan Discussed with: CRNA  Anesthesia Plan Comments:          Anesthesia Quick Evaluation

## 2023-09-12 NOTE — Anesthesia Postprocedure Evaluation (Signed)
 Anesthesia Post Note  Patient: Gloria Sharp  Procedure(s) Performed: ENTEROSCOPY     Patient location during evaluation: Endoscopy Anesthesia Type: MAC Level of consciousness: awake and alert Pain management: pain level controlled Vital Signs Assessment: post-procedure vital signs reviewed and stable Respiratory status: spontaneous breathing, nonlabored ventilation, respiratory function stable and patient connected to nasal cannula oxygen  Cardiovascular status: blood pressure returned to baseline and stable Postop Assessment: no apparent nausea or vomiting Anesthetic complications: no   There were no known notable events for this encounter.  Last Vitals:  Vitals:   09/12/23 0920 09/12/23 0930  BP: 135/69 125/68  Pulse: 89 83  Resp: 20 15  Temp:    SpO2: 95% 95%    Last Pain:  Vitals:   09/12/23 0930  TempSrc:   PainSc: 10-Worst pain ever                 Juline Sanderford L Nariyah Osias

## 2023-09-12 NOTE — Discharge Instructions (Addendum)
 To Gloria Sharp or their caretakers,  You were recently admitted to [facility] for anemia due to upper GI bleeding. 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 This was treated with a technique called Argon laser photocoagulation. We have placed a laboratory order to check your blood count in 1 week.  We have also placed a referral for outpatient IV iron  infusions as your iron  level was very low during your hospital stay.  Continue taking your home medications with the following changes:  Start taking Sucralfate  (Carafate ) 1g three times daily Cyanocobalamin  (Vitamin B12) 1000 mcg tablet once daily Change how you take:  Pantoprazole  (Protonix ) 40mg  twice daily for 30 days, then once daily Continue taking Tylenol  1300 mg daily for pain as needed Alendronate (Fosamax) 35 mg once weekly Aspirin  81 mg chewable tablet daily Atorvastatin  (Lipitor ) 80 mg daily Brimonidine  (Alphagan  aspirin  0.15% ophthalmic solution Cholestyramine  (Questran ) 4 g packet daily Clopidogrel  (Plavix ) 75 mg daily Ferrous sulfate  325 (65 Fe) MG tablet daily Omega-3 fatty acids (fish oil) 1 capsule by mouth daily Gabapentin  (Neurontin ) 300 mg 3 times daily Hydrocodone -acetaminophen  (Norco/Vicodin) 5-325 mg tablet every 4 hours as needed for severe pain Magnesium  p.o. 1 tablet at bedtime Metformin (Glucophage) 1000 mg twice daily Metoprolol  succinate (Toprol -XL) 25 mg, take 1/2 tablet by mouth daily Nitroglycerin  (Nitrostat ) 0.4 mg sublingual tablet every 5 minutes as needed Sacubitril -valsartan  (Entresto ) 24-26 mg 1 tablet by mouth 2 times daily Sertraline  (Zoloft ) 50 mg daily Spironolactone  (Aldactone ) 25 m, take 0.5 tablets by mouth daily Tramadol (Ultram) 50 mg by mouth every 6 hours as needed for moderate pain Turmeric PO, take 1 tablet daily  You should seek further medical care  if you experience dark stools, bright red blood in your stool, vomiting blood, excessive fatigue, shortness of breath, or chest pain.  Please follow up with the following doctors/specialties: Outpatient Physical Therapy Outpatient IV iron  infusions  We recommend that you also see your primary care doctor in about a week to make sure that you continue to improve. We are so glad that you are feeling better.  Sincerely,  Gloria [facility]

## 2023-09-12 NOTE — Op Note (Signed)
 [facility] Patient Name: Gloria Sharp Procedure Date : 09/12/2023 MRN: [id number] Attending MD: Elspeth SQUIBB. Leigh , MD, [id number] Date of Birth: 1945/09/14 CSN: [id number] Age: 78 Admit Type: Inpatient Procedure:                Small bowel enteroscopy Indications:              Symptomatic anemia - dark stools, heme positive,                            history of IDA - on Plavix  / Aspirin  for CAD, stent                            placed in June - Enteroscopy done ON Plavix  to                            further evaluate Providers:                Elspeth SQUIBB. Leigh, MD, Heather Ng, RN,                            Curtistine Bishop, Technician Referring MD:              Medicines:                Monitored Anesthesia Care Complications:            No immediate complications. Estimated blood loss:                            Minimal. Estimated Blood Loss:     Estimated blood loss was minimal. Procedure:                Pre-Anesthesia Assessment:                           - Prior to the procedure, a History and Physical                            was performed, and patient medications and                            allergies were reviewed. The patient's tolerance of                            previous anesthesia was also reviewed. The risks                            and benefits of the procedure and the sedation                            options and risks were discussed with the patient.                            All questions were answered, and informed consent  was obtained. Prior Anticoagulants: The patient has                            taken Plavix  (clopidogrel ), last dose was 1 day                            prior to procedure. ASA Grade Assessment: IV - A                            patient with severe systemic disease that is a                            constant threat to life. After reviewing the risks                            and  benefits, the patient was deemed in                            satisfactory condition to undergo the procedure.                           After obtaining informed consent, the endoscope was                            passed under direct vision. Throughout the                            procedure, the patient's blood pressure, pulse, and                            oxygen  saturations were monitored continuously. The                            [id number]-H190TL ([id number] Olympus slim colonoscope was                            introduced through the mouth and advanced to the                            proximal jejunum. The small bowel enteroscopy was                            accomplished without difficulty. The patient                            tolerated the procedure well. Scope In: Scope Out: Findings:      Esophagogastric landmarks were identified: the Z-line was found at 40       cm, the gastroesophageal junction was found at 40 cm and the upper       extent of the gastric folds was found at 40 cm from the incisors.      The exam of the esophagus was otherwise normal.      Moderate gastric antral vascular ectasia was present in the gastric  antrum. Fulguration to ablate the lesion to prevent bleeding by argon       plasma was successful.      A single 4 to 5 mm sessile polyp was found in the gastric antrum.       Biopsies were taken with a cold forceps for histology, rule out adenoma.      The exam of the stomach was otherwise normal.      There was no evidence of significant pathology in the entire examined       duodenum.      There was no evidence of significant pathology in the proximal jejunum. Impression:               - Esophagogastric landmarks identified.                           - Esophagus.                           - Gastric antral vascular ectasia. Treated with                            argon plasma coagulation (APC).                           - A single gastric polyp.  Biopsied.                           - Normal stomach otherwise.                           - Normal examined duodenum.                           - The examined portion of the jejunum was normal.                           GAVE is the likely cause of her anemia / symptoms.                            Treated with APC with a good result, hopefully this                            provides benefit moving forward. Recommendation:           - Return patient to hospital ward for ongoing care.                           - Full liquid diet now and advance as tolerated                            later today                           - Continue present medications including Plavix                            - Continue protonix  40mg  twice daily for  2 weeks to                            reduce risk for post APC ulcer bleeding, then                            resume at once daily                           - Avoid NSAIDs other than aspirin                            - Start Carafate  tablets - 1 tab TID for the next                            week                           - Await pathology results.                           - Trend Hgb, monitor response to therapy                           - Follow up with primary GI MD - Dr. Larene,                            post discharge                           - GI service will sign off for now, please call                            with questions moving forward Procedure Code(s):        --- Professional ---                           [phone], 59, Small intestinal endoscopy, enteroscopy                            beyond second portion of duodenum, not including                            ileum; with control of bleeding (eg, injection,                            bipolar cautery, unipolar cautery, laser, heater                            probe, stapler, plasma coagulator)                           [id number], Small intestinal endoscopy, enteroscopy                             beyond second portion of  duodenum, not including                            ileum; with biopsy, single or multiple Diagnosis Code(s):        --- Professional ---                           K31.819, Angiodysplasia of stomach and duodenum                            without bleeding                           K31.7, Polyp of stomach and duodenum                           D64.9, Anemia, unspecified CPT copyright 2022 American Medical Association. All rights reserved. The codes documented in this report are preliminary and upon coder review may  be revised to meet current compliance requirements. Elspeth P. Leigh, MD 09/12/2023 9:23:54 AM This report has been signed electronically. Number of Addenda: 0

## 2023-09-12 NOTE — Plan of Care (Signed)
   Problem: Education: Goal: Knowledge of General Education information will improve Description: Including pain rating scale, medication(s)/side effects and non-pharmacologic comfort measures Outcome: Completed/Met

## 2023-09-12 NOTE — Telephone Encounter (Signed)
 Patient referred to infusion pharmacy team for ambulatory infusion of IV iron .  Insurance - [employer]  Site of care - Site of care: [facility] INF Dx code - D50.0  IV Iron  Therapy - Venofer  200 mg IV x 4. Patient received Venofer  200 mg IV x 1 inpatient.  Infusion appointments - Scheduling team will schedule patient as soon as possible.    Vaniah Chambers D. Elizabella Nolet, PharmD

## 2023-09-12 NOTE — Interval H&P Note (Signed)
 History and Physical Interval Note: Patient here for enteroscopy to evaluate anemia, occult positive stools, dark stools, history of IDA. Prior history of upper tract loss. Case done on Plavix  - see consult note for details. She feels improved post transfusion. She understands risks / benefits, wishes to proceed. All questions answered, no interval changes.   09/12/2023 8:12 AM  Gloria Sharp  has presented today for surgery, with the diagnosis of anemia, suspected upper GI bleed.  The various methods of treatment have been discussed with the patient and family. After consideration of risks, benefits and other options for treatment, the patient has consented to  Procedure(s): ENTEROSCOPY (N/A) as a surgical intervention.  The patient's history has been reviewed, patient examined, no change in status, stable for surgery.  I have reviewed the patient's chart and labs.  Questions were answered to the patient's satisfaction.     [name]

## 2023-09-12 NOTE — Evaluation (Signed)
 Occupational Therapy Evaluation Patient Details Name: Gloria Sharp MRN: [id number] DOB: 1945-12-28 Today's Date: 09/12/2023   History of Present Illness   Pt is 78 y.o. female who presented 09/11/23 from her cardiologist's office after labs showed her hemoglobin was down to 6.7 after presenting with progressive fatigue, DOE, and postural dizziness without syncope for the past several weeks. Admitted with acute blood loss anemia. S/p EGD 8/28. PMH - chronic/rt leg pain, DM2, HTN, CVA, aortic stenosis undergoing evaluation for TAVR, mitral regurgitation, HFrEF, fibromyalgia, IBS, asthma, CAD s/p PCI 06/2023, lumbar radiculopathy, angina pectoris     Clinical Impressions Pt is functioning at or near her baseline in ADLs and mobility with RW. She has all necessary DME at home and excellent support of her family. No further OT needs.      If plan is discharge home, recommend the following:   Assistance with cooking/housework     Functional Status Assessment   Patient has not had a recent decline in their functional status     Equipment Recommendations   None recommended by OT     Recommendations for Other Services         Precautions/Restrictions   Precautions Precautions: Fall Recall of Precautions/Restrictions: Intact     Mobility Bed Mobility               General bed mobility comments: received walking in hall    Transfers Overall transfer level: Modified independent Equipment used: Rolling walker (2 wheels)                      Balance Overall balance assessment: Needs assistance   Sitting balance-Leahy Scale: Good     Standing balance support: Bilateral upper extremity supported, During functional activity, No upper extremity supported Standing balance-Leahy Scale: Fair                             ADL either performed or assessed with clinical judgement   ADL Overall ADL's : At baseline                                              Vision Baseline Vision/History: 1 Wears glasses Ability to See in Adequate Light: 0 Adequate Patient Visual Report: No change from baseline       Perception         Praxis         Pertinent Vitals/Pain Pain Assessment Pain Assessment: Faces Faces Pain Scale: Hurts little more Pain Location: chronic back and R leg pain Pain Descriptors / Indicators: Discomfort, Radiating Pain Intervention(s): Repositioned, Premedicated before session     Extremity/Trunk Assessment Upper Extremity Assessment Upper Extremity Assessment: Overall WFL for tasks assessed   Lower Extremity Assessment Lower Extremity Assessment: Defer to PT evaluation RLE Deficits / Details: Pt reports hx of R leg numbness and shooting/burning nerve pain from her back; MMT score of 4/5 quads (5/5 on L for comparison), and 4+/5 anterior tibialis bil RLE Sensation: decreased light touch   Cervical / Trunk Assessment Cervical / Trunk Assessment: Kyphotic;Other exceptions (chronic back pain)   Communication Communication Communication: Impaired Factors Affecting Communication: Hearing impaired   Cognition Arousal: Alert Behavior During Therapy: WFL for tasks assessed/performed Cognition: No apparent impairments  Following commands: Intact       Cueing  General Comments   Cueing Techniques: Verbal cues  BP 135/61 sitting, 163/82 standing   Exercises     Shoulder Instructions      Home Living Family/patient expects to be discharged to:: Private residence Living Arrangements: Spouse/significant other;Other relatives (78 year old grandson) Available Help at Discharge: Family;Available 24 hours/day Type of Home: House Home Access: Ramped entrance     Home Layout: One level     Bathroom Shower/Tub: Chief Strategy Officer: Handicapped height     Home Equipment: Agricultural consultant (2 wheels);Rollator (4 wheels);Cane -  single point;BSC/3in1;Transport chair;Tub bench          Prior Functioning/Environment Prior Level of Function : Independent/Modified Independent;Driving             Mobility Comments: cane or RW indoors, rollator outdoors ADLs Comments: Mod I using shower chair for bathing, recently purchased a tub bench, active with her church    OT Problem List:     OT Treatment/Interventions:        OT Goals(Current goals can be found in the care plan section)       OT Frequency:       Co-evaluation              AM-PAC OT 6 Clicks Daily Activity     Outcome Measure Help from another person eating meals?: None Help from another person taking care of personal grooming?: None Help from another person toileting, which includes using toliet, bedpan, or urinal?: None Help from another person bathing (including washing, rinsing, drying)?: None Help from another person to put on and taking off regular upper body clothing?: None Help from another person to put on and taking off regular lower body clothing?: None 6 Click Score: 24   End of Session Equipment Utilized During Treatment: Rolling walker (2 wheels)  Activity Tolerance: Patient tolerated treatment well Patient left: in chair;with call bell/phone within reach  OT Visit Diagnosis: Unsteadiness on feet (R26.81);Pain                Time: 1210-1227 OT Time Calculation (min): 17 min Charges:  OT General Charges $OT Visit: 1 Visit OT Evaluation $OT Eval Low Complexity: 1 Low Gloria Sharp, OTR/L Acute Rehabilitation Services Office: [phone]   Kennth Gloria Helling 09/12/2023, 1:32 PM

## 2023-09-12 NOTE — TOC CM/SW Note (Signed)
 Transition of Care [facility]) - Inpatient Brief Assessment   Patient Details  Name: Gloria Sharp MRN: [id number] Date of Birth: 06/22/1945  [facility] CM/SW Contact:    Gloria Sharp, Gloria Muskrat, RN Phone Number: 09/12/2023, 1:45 PM   Clinical Narrative:  Patient presented to the ED from her PCP d/t Hgb 6.7 from labs drawn at their office with Generalized Weakness and Shortness Of Breath. Admitted with Acute Blood Loss Anemia. Patient received 2U PRBC, hgb today at 9.0. Patient recently underwent Lt Heart Cath on 06/26/23. DAPT with aspirin /Plavix  was recommended for 6 months. Cardiology and GI consulted. Patient underwent EGD/Enteroscopy today 09/12/23.     CM spoke with patient, husband Gloria Sharp and daughter Gloria Sharp at bedside about needs for post hospital transition.  Patient is from home with husband, has 2 living children and three step children. Modified independent d/t a pinched nerve to her Hip. Has all necessary DME's at home.  PCP is Gloria Hun, MD and uses [facility] in [facility].   Outpatient PT/OT recommended, patient states she is active with [facility] and would like to continue. Order and resumption of care referral on AVS.  Patient not Medically ready for discharge.  CM will continue to follow as patient progresses with care towards discharge.       Transition of Care Asessment: Insurance and Status: Insurance coverage has been reviewed Patient has primary care physician: Yes Home environment has been reviewed: Yes Prior level of function:: Modified Independent Prior/Current Home Services: No current home services Social Drivers of Health Review: SDOH reviewed no interventions necessary Readmission risk has been reviewed: Yes Transition of care needs: transition of care needs identified, TOC will continue to follow

## 2023-09-13 ENCOUNTER — Other Ambulatory Visit: Payer: Self-pay

## 2023-09-13 ENCOUNTER — Other Ambulatory Visit (HOSPITAL_COMMUNITY): Payer: Self-pay

## 2023-09-13 DIAGNOSIS — K31811 Angiodysplasia of stomach and duodenum with bleeding: Principal | ICD-10-CM

## 2023-09-13 DIAGNOSIS — K922 Gastrointestinal hemorrhage, unspecified: Secondary | ICD-10-CM

## 2023-09-13 DIAGNOSIS — K31819 Angiodysplasia of stomach and duodenum without bleeding: Secondary | ICD-10-CM

## 2023-09-13 LAB — CBC
HCT: 27.8 % — ABNORMAL LOW (ref 36.0–46.0)
Hemoglobin: 9 g/dL — ABNORMAL LOW (ref 12.0–15.0)
MCH: 26 pg (ref 26.0–34.0)
MCHC: 32.4 g/dL (ref 30.0–36.0)
MCV: 80.3 fL (ref 80.0–100.0)
Platelets: 369 K/uL (ref 150–400)
RBC: 3.46 MIL/uL — ABNORMAL LOW (ref 3.87–5.11)
RDW: 15.4 % (ref 11.5–15.5)
WBC: 11.9 K/uL — ABNORMAL HIGH (ref 4.0–10.5)
nRBC: 0 % (ref 0.0–0.2)

## 2023-09-13 LAB — BASIC METABOLIC PANEL WITH GFR
Anion gap: 9 (ref 5–15)
BUN: 13 mg/dL (ref 8–23)
CO2: 24 mmol/L (ref 22–32)
Calcium: 9 mg/dL (ref 8.9–10.3)
Chloride: 104 mmol/L (ref 98–111)
Creatinine, Ser: 1.05 mg/dL — ABNORMAL HIGH (ref 0.44–1.00)
GFR, Estimated: 55 mL/min — ABNORMAL LOW (ref >60)
Glucose, Bld: 140 mg/dL — ABNORMAL HIGH (ref 70–99)
Potassium: 4.3 mmol/L (ref 3.5–5.1)
Sodium: 137 mmol/L (ref 135–145)

## 2023-09-13 LAB — SURGICAL PATHOLOGY

## 2023-09-13 MED ORDER — NITROGLYCERIN 0.4 MG SL SUBL
0.4000 mg | SUBLINGUAL_TABLET | SUBLINGUAL | 2 refills | Status: AC | PRN
  Filled 2023-09-13: qty 75, 21d supply, fill #0

## 2023-09-13 MED ORDER — PANTOPRAZOLE SODIUM 40 MG PO TBEC
40.0000 mg | DELAYED_RELEASE_TABLET | Freq: Two times a day (BID) | ORAL | 0 refills | Status: AC
  Filled 2023-09-13: qty 60, 30d supply, fill #0

## 2023-09-13 MED ORDER — VITAMIN B-12 1000 MCG PO TABS
1000.0000 ug | ORAL_TABLET | Freq: Every day | ORAL | 0 refills | Status: DC
  Filled 2023-09-13: qty 30, 30d supply, fill #0

## 2023-09-13 MED ORDER — SUCRALFATE 1 G PO TABS
1.0000 g | ORAL_TABLET | Freq: Three times a day (TID) | ORAL | 0 refills | Status: AC
  Filled 2023-09-13: qty 90, 30d supply, fill #0

## 2023-09-13 NOTE — Discharge Summary (Signed)
 Name: Gloria Sharp MRN: [id number] DOB: Apr 01, 1945 78 y.o. PCP: Gloria Hun, MD  Date of Admission: 09/11/2023  9:36 AM Date of Discharge: 09/13/2023  Attending Physician: Dr. Jone Sharp  Discharge Diagnosis: Principal Problem:   Acute blood loss anemia Active Problems:   Essential hypertension   Aortic stenosis   Diabetes (HCC)   Dizziness   HFrEF (heart failure with reduced ejection fraction) (HCC)   CAD (coronary artery disease)   Symptomatic anemia   Upper GI bleed   Antiplatelet or antithrombotic long-term use   Gastric polyp   GAVE (gastric antral vascular ectasia)    Discharge Medications: Allergies as of 09/13/2023       Reactions   Celebrex [celecoxib] Itching, Swelling   Swelling throughout body Skin redness    Keflex  [cephalexin ] Itching   09/2018 note: tolerated Augmentin   Relafen [nabumetone] Itching, Swelling   Cipro [ciprofloxacin Hcl] Hives, Itching, Rash   Flagyl [metronidazole] Itching, Swelling, Rash        Medication List     TAKE these medications    acetaminophen  650 MG CR tablet Commonly known as: TYLENOL  Take 1,300 mg by mouth daily. May take an additional 650 as needed for pain   alendronate 35 MG tablet Commonly known as: FOSAMAX Take 35 mg by mouth every 7 (seven) days.   aspirin  81 MG chewable tablet Chew 1 tablet (81 mg total) by mouth daily.   atorvastatin  80 MG tablet Commonly known as: LIPITOR  Take 1 tablet (80 mg total) by mouth daily.   brimonidine  0.15 % ophthalmic solution Commonly known as: ALPHAGAN  Place 1 drop into both eyes 2 (two) times daily.   cholestyramine  4 g packet Commonly known as: Questran  Take 1 packet (4 g total) by mouth daily. Take at least 2 hours before or after rest of the medications What changed:  when to take this reasons to take this additional instructions   clopidogrel  75 MG tablet Commonly known as: PLAVIX  Take 1 tablet (75 mg total) by mouth daily.   cyanocobalamin  1000 MCG  tablet Commonly known as: VITAMIN B12 Take 1 tablet (1,000 mcg total) by mouth daily. What changed:  medication strength how much to take when to take this   ferrous sulfate  325 (65 FE) MG EC tablet Take 325 mg by mouth daily in the afternoon.   FISH OIL PO Take 1 capsule by mouth daily in the afternoon.   gabapentin  300 MG capsule Commonly known as: NEURONTIN  Take 300 mg by mouth 3 (three) times daily.   HYDROcodone -acetaminophen  5-325 MG tablet Commonly known as: NORCO/VICODIN Take 1 tablet by mouth every 4 (four) hours as needed for severe pain (pain score 7-10) or moderate pain (pain score 4-6).   MAGNESIUM  PO Take 1 tablet by mouth at bedtime.   metFORMIN 1000 MG tablet Commonly known as: GLUCOPHAGE Take 1,000 mg by mouth 2 (two) times daily with a meal.   metoprolol  succinate 25 MG 24 hr tablet Commonly known as: Toprol  XL Take 0.5 tablets (12.5 mg total) by mouth daily. What changed: when to take this   nitroGLYCERIN  0.4 MG SL tablet Commonly known as: NITROSTAT  Place 1 tablet (0.4 mg total) under the tongue every 5 (five) minutes as needed. What changed: reasons to take this   OVER THE COUNTER MEDICATION Apply 1 patch topically daily as needed (back pain). [place] pain patch   pantoprazole  40 MG tablet Commonly known as: PROTONIX  Take 1 tablet (40 mg total) by mouth 2 (two) times daily.  What changed: when to take this   sacubitril -valsartan  24-26 MG Commonly known as: ENTRESTO  Take 1 tablet by mouth 2 (two) times daily.   sertraline  50 MG tablet Commonly known as: ZOLOFT  Take 50 mg by mouth daily.   spironolactone  25 MG tablet Commonly known as: ALDACTONE  Take 0.5 tablets (12.5 mg total) by mouth daily.   sucralfate  1 g tablet Commonly known as: CARAFATE  Take 1 tablet (1 g total) by mouth 3 (three) times daily.   traMADol 50 MG tablet Commonly known as: ULTRAM Take 50 mg by mouth every 6 (six) hours as needed for moderate pain (pain score 4-6)  or severe pain (pain score 7-10).   TURMERIC PO Take 1 tablet by mouth daily in the afternoon.        Disposition and follow-up:   Gloria Sharp was discharged from [facility] in Good condition.  At the hospital follow up visit please address:  1.  Follow-up:  a. GAVE/Anemia - She received 2u pRBCs with improvement of Hgb to 9.0. She should follow up with a CBC in 1 week to monitor for further bleeding. We have given her 1 IV iron  infusion with follow-up for outpatient IV iron  infusions. Inpatient GI changed regimen of pantoprazole  to 40 mg twice daily with sucralfate  1g 3 times daily. She should follow-up with her outpatient gastroenterologist.    b.  Aortic stenosis - she still has outpatient TEE scheduled on 09/24/2023.   c.  Vitamin B12 deficiency -B12 level was found to be low-normal.  We have started her back on oral supplementation with 1000 mcg cyanocobalamin .   d.  Diabetes mellitus -A1c of 6.6, still at goal. We did not make any adjustments to her medications. Please address any necessary changes to antihyperglycemic regimen.  2.  Labs / imaging needed at time of follow-up: CBC  3.  Pending labs/ test needing follow-up: surgical pathology from enteroscopy   Follow-up Appointments:  Follow-up Information     Gloria Sharp Follow up.   Why: Call to schedule your resumption of care visit. Contact information: [address] [place] [address] [phone]                 Hospital Course by problem list: Gloria Sharp is a 78 y.o. with a pertinent PMH of CAD s/p PCI 06/2023, aortic stenosis undergoing evaluation for TAVR, mitral regurgitation, HFrEF, type 2 diabetes, hypertension, prior CVA, and lumbar radiculopathy who presented with progressive dyspnea, fatigue, and postural dizziness and is admitted for symptomatic anemia due to likely GI bleed.    #Acute blood loss anemia d/t GAVE #History of upper GI bleed #Iron   deficency Patient initially presented to the ED with dark stools and hemoglobin down to 6.7. She has a reported history of upper GI bleed treated with argon laser photocoagulation. No other bleeding symptoms. During her hospitalization, she received 2 units of PRBCs with improvement of hemoglobin to 9.0 on day of discharge. She was found to be iron  deficient with a low-normal vitamin B12 level. She received 1 IV iron  infusion with plans for outpatient IV iron  in the setting of TEE for TAVR planning. Due to her recent PCI in June 2025, the patient's DAPT was stopped for 1 day for  EGD by GI this admission which showed GAVE treated with APC with good result. Aspirin  and Plavix  restarted on same evening following EGD. Upon discharge, the patient was placed on Carafate  1g 3 times daily with pantoprazole  40 mg  twice daily for 30 days then transition to once daily. She also received oral vitamin B12 supplementation. After EGD, the patient was placed on clear liquid diet and advanced to full diet without complications. On day of discharge, the patient was no longer having dark stools or any other bleeding symptoms. She felt great.   #CAD s/p PCI 06/2023 on DAPT #HFrEF #Severe aortic stenosis #Hypertension Patient remained euvolemic with no signs of heart failure exacerbation during this admission. On exam she was warm and well-perfused. She has a TEE scheduled on 09/24/23 for potential TAVR. Cardiology was consulted this admission given acute bleed in the setting of her DAPT. Cardiology originally recommended DAPT for 6 months but this was held for a few days for her enteroscopy. We continued her home medications including spironolactone  12.5mg  daily, Entresto  24-26mg  bid, metoprolol  succinate 12.5mg  daily, and atorvastatin  80mg  daily.  #Type 2 diabetes Last A1c 5.9 on 05/15/2022. Updated A1c increased to 6.6 on 09/12/2023. Home meds include metformin 1000mg  bid. During her admission, we held her metformin with plans  for sliding scale if necessary, however this was not started. She should follow-up outpatient with PCP for any changes to her antihyperglycemic regimen.   #Chronic back pain w/ lumbar radiculopathy  She has a history of chronic back pain and did not have any increased pain during this admission. No acute concerns at this time. We continued her home regimen of gabapentin , Tylenol , and Norco. After 1 dose of her Norco, she was saying that she felt drowsy and wanted more conservative options. She noted using menthol patches at home so we gave her as needed Bengay cream.  #Anxiety/depression During this admission mood was stable and there was no concern acute concerns. We continued her home medication of sertraline  50 mg daily.   Discharge Subjective: Ms.Gloria Sharp reports feeling much better and ready for discharge. She is not having any abdominal pain, N/V/D, hematemesis, melena, hematochezia. Also denies chest pain or shortness of breath. Patient asked if she could attend a crafting show tomorrow. Advised the patient to listen to her body and reengage in activity slowly.  Patient is medically ready for discharge.  Discharge Exam:   BP (!) 143/64 (BP Location: Left Arm)   Pulse 70   Temp 98.1 F (36.7 C)   Resp 19   Ht 5' (1.524 m)   Wt 102.2 kg   SpO2 97%   BMI 44.00 kg/m  Constitutional: well-appearing obese woman lying in hospital bed, in no acute distress;  HENT: normocephalic atraumatic, mucous membranes moist Eyes: conjunctiva non-erythematous Neck: supple Cardiovascular: regular rate and rhythm, harsh systolic murmur, unchanged from prior exam Pulmonary/Chest: normal work of breathing on room air, lungs clear to auscultation bilaterally Abdominal: soft, non-tender, non-distended MSK: normal bulk and tone Neurological: alert & oriented x 3 Skin: warm and dry Psych: Normal mood which is congruent with affect   Pertinent Labs, Studies, and Procedures:     Latest Ref Rng &  Units 09/13/2023    4:10 AM 09/12/2023    5:58 AM 09/12/2023   12:53 AM  CBC  WBC 4.0 - 10.5 K/uL 11.9  9.9    Hemoglobin 12.0 - 15.0 g/dL 9.0  9.0  8.3   Hematocrit 36.0 - 46.0 % 27.8  28.4  26.7   Platelets 150 - 400 K/uL 369  374         Latest Ref Rng & Units 09/13/2023    4:10 AM 09/12/2023    5:58 AM 09/11/2023  9:41 AM  CMP  Glucose 70 - 99 mg/dL 859  88  873   BUN 8 - 23 mg/dL 13  16  24    Creatinine 0.44 - 1.00 mg/dL 8.94  9.04  9.00   Sodium 135 - 145 mmol/L 137  139  137   Potassium 3.5 - 5.1 mmol/L 4.3  4.3  4.9   Chloride 98 - 111 mmol/L 104  108  109   CO2 22 - 32 mmol/L 24  22  20    Calcium  8.9 - 10.3 mg/dL 9.0  8.9  8.8   Total Protein 6.5 - 8.1 g/dL   6.1   Total Bilirubin 0.0 - 1.2 mg/dL   0.6   Alkaline Phos 38 - 126 U/L   76   AST 15 - 41 U/L   17   ALT 0 - 44 U/L   13     DG Chest 1 View Result Date: 09/11/2023 CLINICAL DATA:  Gastrointestinal bleeding. Ongoing weakness and shortness of breath. EXAM: CHEST  1 VIEW COMPARISON:  08/14/2023 FINDINGS: Stable enlarged cardiac silhouette. Decreased inspiration with clear lungs and normal vascularity. Interval mild-to-moderate peribronchial thickening. Interval faint nodular opacity in the right mid lung zone which appears to represent overlapping ribs and vessels. No acute bony abnormality. IMPRESSION: 1. Interval mild to moderate bronchitic changes. 2. Stable cardiomegaly. 3. Interval faint nodular opacity in the right mid lung zone which appears to represent overlapping ribs and vessels. Recommend follow-up PA and lateral chest radiographs in 3-4 weeks. Electronically Signed   By: Elspeth Bathe M.D.   On: 09/11/2023 10:36    Discharge Instructions:   Discharge Instructions      To MONTOYA WATKIN or their caretakers,  You were recently admitted to [facility] for anemia due to upper GI bleeding. The cause of your bleeding was GAVE syndrome (gastric antral vascular ectasia) which is a condition involving  abnormalities in the blood vessels of your lower stomach. This was treated with a technique called Argon laser photocoagulation. We have placed a laboratory order to check your blood count in 1 week.  We have also placed a referral for outpatient IV iron  infusions as your iron  level was very low during your hospital stay.  Continue taking your home medications with the following changes:  Start taking Sucralfate  (Carafate ) 1g three times daily Cyanocobalamin  (Vitamin B12) 1000 mcg tablet once daily Change how you take:  Pantoprazole  (Protonix ) 40mg  twice daily for 30 days, then once daily Continue taking Tylenol  1300 mg daily for pain as needed Alendronate (Fosamax) 35 mg once weekly Aspirin  81 mg chewable tablet daily Atorvastatin  (Lipitor ) 80 mg daily Brimonidine  (Alphagan  aspirin  0.15% ophthalmic solution Cholestyramine  (Questran ) 4 g packet daily Clopidogrel  (Plavix ) 75 mg daily Ferrous sulfate  325 (65 Fe) MG tablet daily Omega-3 fatty acids (fish oil) 1 capsule by mouth daily Gabapentin  (Neurontin ) 300 mg 3 times daily Hydrocodone -acetaminophen  (Norco/Vicodin) 5-325 mg tablet every 4 hours as needed for severe pain Magnesium  p.o. 1 tablet at bedtime Metformin (Glucophage) 1000 mg twice daily Metoprolol  succinate (Toprol -XL) 25 mg, take 1/2 tablet by mouth daily Nitroglycerin  (Nitrostat ) 0.4 mg sublingual tablet every 5 minutes as needed Sacubitril -valsartan  (Entresto ) 24-26 mg 1 tablet by mouth 2 times daily Sertraline  (Zoloft ) 50 mg daily Spironolactone  (Aldactone ) 25 m, take 0.5 tablets by mouth daily Tramadol (Ultram) 50 mg by mouth every 6 hours as needed for moderate pain Turmeric PO, take 1 tablet daily  You should seek further medical care if you  experience dark stools, bright red blood in your stool, vomiting blood, excessive fatigue, shortness of breath, or chest pain.  Please follow up with the following doctors/specialties: Outpatient Physical Therapy Outpatient IV  iron  infusions  We recommend that you also see your primary care doctor in about a week to make sure that you continue to improve. We are so glad that you are feeling better.  Sincerely,  [facility]      Signed:  Letha Cheadle, MD Internal Medicine Resident, PGY-1 09/13/2023, 1:32 PM Please contact the on call pager after 5 pm and on weekends at [phone].

## 2023-09-13 NOTE — Progress Notes (Signed)
 Mobility Specialist Progress Note:    09/13/23 1200  Mobility  Activity Ambulated with assistance  Level of Assistance Contact guard assist, steadying assist  Assistive Device Front wheel walker  Distance Ambulated (ft) 200 ft  Activity Response Tolerated well  Mobility Referral Yes  Mobility visit 1 Mobility  Mobility Specialist Start Time (ACUTE ONLY) 1123  Mobility Specialist Stop Time (ACUTE ONLY) 1136  Mobility Specialist Time Calculation (min) (ACUTE ONLY) 13 min   Received pt in chair c/o abdominal pain but agreeable to mobility. Pt was asymptomatic throughout ambulation and returned to room w/o fault. Left in chair w/ call bell in reach and all needs met.   Thersia Minder Mobility Specialist  Please contact vis Secure Chat or  Rehab Office [phone]

## 2023-09-14 ENCOUNTER — Ambulatory Visit: Payer: Self-pay | Admitting: Gastroenterology

## 2023-09-15 DIAGNOSIS — I152 Hypertension secondary to endocrine disorders: Secondary | ICD-10-CM | POA: Diagnosis not present

## 2023-09-16 DIAGNOSIS — I152 Hypertension secondary to endocrine disorders: Secondary | ICD-10-CM | POA: Diagnosis not present

## 2023-09-16 DIAGNOSIS — E1159 Type 2 diabetes mellitus with other circulatory complications: Secondary | ICD-10-CM | POA: Diagnosis not present

## 2023-09-17 ENCOUNTER — Ambulatory Visit: Payer: Self-pay | Admitting: Cardiovascular Disease

## 2023-09-17 ENCOUNTER — Encounter (HOSPITAL_COMMUNITY): Payer: Self-pay | Admitting: Gastroenterology

## 2023-09-18 ENCOUNTER — Other Ambulatory Visit: Payer: Self-pay

## 2023-09-18 DIAGNOSIS — Z6841 Body Mass Index (BMI) 40.0 and over, adult: Secondary | ICD-10-CM | POA: Diagnosis not present

## 2023-09-18 DIAGNOSIS — Z9181 History of falling: Secondary | ICD-10-CM | POA: Diagnosis not present

## 2023-09-18 DIAGNOSIS — I350 Nonrheumatic aortic (valve) stenosis: Secondary | ICD-10-CM

## 2023-09-18 DIAGNOSIS — Z789 Other specified health status: Secondary | ICD-10-CM | POA: Diagnosis not present

## 2023-09-18 DIAGNOSIS — D649 Anemia, unspecified: Secondary | ICD-10-CM | POA: Diagnosis not present

## 2023-09-18 DIAGNOSIS — I340 Nonrheumatic mitral (valve) insufficiency: Secondary | ICD-10-CM | POA: Diagnosis not present

## 2023-09-18 DIAGNOSIS — Z1331 Encounter for screening for depression: Secondary | ICD-10-CM | POA: Diagnosis not present

## 2023-09-24 ENCOUNTER — Ambulatory Visit (HOSPITAL_BASED_OUTPATIENT_CLINIC_OR_DEPARTMENT_OTHER)

## 2023-09-24 ENCOUNTER — Ambulatory Visit (HOSPITAL_COMMUNITY)
Admission: RE | Admit: 2023-09-24 | Discharge: 2023-09-24 | Disposition: A | Discharge status: Home / Self Care | Source: Ambulatory Visit | Attending: Internal Medicine | Admitting: Internal Medicine

## 2023-09-24 ENCOUNTER — Encounter (HOSPITAL_COMMUNITY): Payer: Self-pay | Admitting: Internal Medicine

## 2023-09-24 ENCOUNTER — Encounter (HOSPITAL_COMMUNITY)
Admission: RE | Disposition: A | Discharge status: Home / Self Care | Payer: Self-pay | Source: Home / Self Care | Attending: Internal Medicine

## 2023-09-24 ENCOUNTER — Ambulatory Visit (HOSPITAL_COMMUNITY)

## 2023-09-24 ENCOUNTER — Other Ambulatory Visit: Payer: Self-pay

## 2023-09-24 ENCOUNTER — Ambulatory Visit (HOSPITAL_COMMUNITY)
Admission: RE | Admit: 2023-09-24 | Discharge: 2023-09-24 | Disposition: A | Discharge status: Home / Self Care | Attending: Internal Medicine | Admitting: Internal Medicine

## 2023-09-24 DIAGNOSIS — Z955 Presence of coronary angioplasty implant and graft: Secondary | ICD-10-CM | POA: Insufficient documentation

## 2023-09-24 DIAGNOSIS — K219 Gastro-esophageal reflux disease without esophagitis: Secondary | ICD-10-CM | POA: Diagnosis not present

## 2023-09-24 DIAGNOSIS — E119 Type 2 diabetes mellitus without complications: Secondary | ICD-10-CM | POA: Insufficient documentation

## 2023-09-24 DIAGNOSIS — I342 Nonrheumatic mitral (valve) stenosis: Secondary | ICD-10-CM | POA: Diagnosis not present

## 2023-09-24 DIAGNOSIS — I5030 Unspecified diastolic (congestive) heart failure: Secondary | ICD-10-CM | POA: Diagnosis not present

## 2023-09-24 DIAGNOSIS — I5020 Unspecified systolic (congestive) heart failure: Secondary | ICD-10-CM

## 2023-09-24 DIAGNOSIS — Z8673 Personal history of transient ischemic attack (TIA), and cerebral infarction without residual deficits: Secondary | ICD-10-CM | POA: Insufficient documentation

## 2023-09-24 DIAGNOSIS — I340 Nonrheumatic mitral (valve) insufficiency: Secondary | ICD-10-CM | POA: Diagnosis not present

## 2023-09-24 DIAGNOSIS — I110 Hypertensive heart disease with heart failure: Secondary | ICD-10-CM

## 2023-09-24 DIAGNOSIS — I080 Rheumatic disorders of both mitral and aortic valves: Secondary | ICD-10-CM | POA: Diagnosis not present

## 2023-09-24 DIAGNOSIS — F32A Depression, unspecified: Secondary | ICD-10-CM | POA: Diagnosis not present

## 2023-09-24 DIAGNOSIS — Z79899 Other long term (current) drug therapy: Secondary | ICD-10-CM | POA: Diagnosis not present

## 2023-09-24 DIAGNOSIS — I361 Nonrheumatic tricuspid (valve) insufficiency: Secondary | ICD-10-CM

## 2023-09-24 DIAGNOSIS — G8929 Other chronic pain: Secondary | ICD-10-CM | POA: Diagnosis not present

## 2023-09-24 DIAGNOSIS — M549 Dorsalgia, unspecified: Secondary | ICD-10-CM | POA: Diagnosis not present

## 2023-09-24 DIAGNOSIS — I2510 Atherosclerotic heart disease of native coronary artery without angina pectoris: Secondary | ICD-10-CM

## 2023-09-24 HISTORY — PX: TRANSESOPHAGEAL ECHOCARDIOGRAM (CATH LAB): EP1270

## 2023-09-24 LAB — ECHO TEE
AR max vel: 0.73 cm2
AV Area VTI: 0.7 cm2
AV Area mean vel: 0.76 cm2
AV Mean grad: 21 mmHg
AV Peak grad: 34.3 mmHg
Ao pk vel: 2.93 m/s
MV M vel: 5.12 m/s
MV Peak grad: 104.8 mmHg

## 2023-09-24 LAB — GLUCOSE, CAPILLARY: Glucose-Capillary: 110 mg/dL — ABNORMAL HIGH (ref 70–99)

## 2023-09-24 SURGERY — TRANSESOPHAGEAL ECHOCARDIOGRAM (TEE) (CATHLAB)
Anesthesia: Monitor Anesthesia Care

## 2023-09-24 MED ORDER — LIDOCAINE 2% (20 MG/ML) 5 ML SYRINGE
INTRAMUSCULAR | Status: DC | PRN
  Administered 2023-09-24: 40 mg via INTRAVENOUS

## 2023-09-24 MED ORDER — PROPOFOL 500 MG/50ML IV EMUL
INTRAVENOUS | Status: DC | PRN
  Administered 2023-09-24: 50 ug/kg/min via INTRAVENOUS

## 2023-09-24 MED ORDER — PROPOFOL 10 MG/ML IV BOLUS
INTRAVENOUS | Status: DC | PRN
  Administered 2023-09-24: 30 mg via INTRAVENOUS
  Administered 2023-09-24 (×4): 10 mg via INTRAVENOUS

## 2023-09-24 MED ORDER — SODIUM CHLORIDE 0.9 % IV SOLN: INTRAVENOUS | Status: DC

## 2023-09-24 NOTE — Anesthesia Preprocedure Evaluation (Signed)
 Anesthesia Evaluation  Patient identified by MRN, date of birth, ID band Patient awake    Reviewed: Allergy & Precautions, NPO status , Patient's Chart, lab work & pertinent test results  History of Anesthesia Complications (+) PONV and history of anesthetic complications  Airway Mallampati: II  TM Distance: >3 FB Neck ROM: Full    Dental  (+) Dental Advisory Given, Teeth Intact, Missing,    Pulmonary neg shortness of breath, neg sleep apnea, neg COPD, neg recent URI   breath sounds clear to auscultation       Cardiovascular hypertension, Pt. on medications and Pt. on home beta blockers (-) angina + CAD, + Cardiac Stents and +CHF  + Valvular Problems/Murmurs MR  Rhythm:Regular  Cardiac Catheterization 06/26/23: Hemodynamic data: Hemodynamic data: LV 172/5, EDP 18 mmHg.  Ao 176/55, mean 96 mmHg.  Peak to peak pressure gradient across the aortic valve at 21.7 with a mean gradient of 22.8 mmHg with calculated aortic valve area of 1.14 cm.   RA 12/10, mean 8 mmHg. RV 45/15, EDP 13 mmHg. PA 47/23, mean 32 mmHg PW 23/22, mean 18 mmHg. CO 6.82, CI 3.58 by Fick.  QP/QS 1.0.  PAPi 2.9.   Angiographic data: LM: Mild calcified but logical vessel. LAD: Large-caliber vessel, gives origin to small D1 and a moderate-sized D2, LAD has mid tandem 40% stenosis, ostial D2 40% stenosis and mid to distal LAD 40% tandem stenoses. LCx: Moderate caliber vessel with ostial 30% stenosis and after the origin of a small OM1 there is a focal 80% stenosis.  Large OM 2. RCA: Mildly diseased in the proximal segment.  Very large caliber vessel, large PDA and large PL branch with a focal mid 90% stenosis.   Intervention data: Successful score flex balloon angioplasty of mid CX with 2.5 x 10 mm balloon stenosis reduced from 80% to 0%.  TIMI-3 to TIMI-3 flow. Successful stenting of the PL branch of RCA with implantation of a 3.0 x 16 mm Synergy XD DES, stenosis  reduced from 90% to 0% with TIMI-3 to TIMI-3 flow.  Highly resilient lesion could not get adequate result with balloon angioplasty.      Neuro/Psych  Neuromuscular disease CVA, No Residual Symptoms  negative psych ROS   GI/Hepatic ,GERD  Medicated and Controlled,,  Endo/Other  diabetes    Renal/GU Renal diseaseLab Results      Component                Value               Date                      NA                       137                 09/13/2023                K                        4.3                 09/13/2023                CO2                      24  09/13/2023                GLUCOSE                  140 (H)             09/13/2023                BUN                      13                  09/13/2023                CREATININE               1.05 (H)            09/13/2023                CALCIUM                   9.0                 09/13/2023                EGFR                     57 (L)              09/10/2023                GFRNONAA                 55 (L)              09/13/2023                Musculoskeletal  (+)  Fibromyalgia -  Abdominal   Peds  Hematology  (+) Blood dyscrasia, anemia Lab Results      Component                Value               Date                      WBC                      11.9 (H)            09/13/2023                HGB                      9.0 (L)             09/13/2023                HCT                      27.8 (L)            09/13/2023                MCV                      80.3                09/13/2023  PLT                      369                 09/13/2023              Anesthesia Other Findings   Reproductive/Obstetrics                              Anesthesia Physical Anesthesia Plan  ASA: 3  Anesthesia Plan: MAC   Post-op Pain Management: Minimal or no pain anticipated   Induction: Intravenous  PONV Risk Score and Plan: 3 and Propofol  infusion and  Treatment may vary due to age or medical condition  Airway Management Planned: Nasal Cannula, Natural Airway and Simple Face Mask  Additional Equipment: None  Intra-op Plan:   Post-operative Plan:   Informed Consent: I have reviewed the patients History and Physical, chart, labs and discussed the procedure including the risks, benefits and alternatives for the proposed anesthesia with the patient or authorized representative who has indicated his/her understanding and acceptance.     Dental advisory given  Plan Discussed with: CRNA  Anesthesia Plan Comments:         Anesthesia Quick Evaluation

## 2023-09-24 NOTE — Discharge Instructions (Signed)

## 2023-09-24 NOTE — Transfer of Care (Signed)
 Immediate Anesthesia Transfer of Care Note  Patient: Gloria Sharp  Procedure(s) Performed: TRANSESOPHAGEAL ECHOCARDIOGRAM  Patient Location: Cath Lab  Anesthesia Type:MAC  Level of Consciousness: awake, alert , oriented, and patient cooperative  Airway & Oxygen  Therapy: Patient Spontanous Breathing  Post-op Assessment: Report given to RN and Post -op Vital signs reviewed and stable  Post vital signs: Reviewed and stable  Last Vitals:  Vitals Value Taken Time  BP 122/47 09/24/23 10:31  Temp 36.5 C 09/24/23 10:31  Pulse 74 09/24/23 10:32  Resp 14 09/24/23 10:32  SpO2 100 % 09/24/23 10:32  Vitals shown include unfiled device data.  Last Pain:  Vitals:   09/24/23 1031  TempSrc: Tympanic  PainSc: Asleep         Complications: No notable events documented.

## 2023-09-24 NOTE — Progress Notes (Signed)
  Echocardiogram Echocardiogram Transesophageal has been performed.  Koleen KANDICE Popper, RDCS 09/24/2023, 10:38 AM

## 2023-09-24 NOTE — CV Procedure (Addendum)
 INDICATIONS: MR/AS  PROCEDURE:   Informed consent was obtained prior to the procedure. The risks, benefits and alternatives for the procedure were discussed and the patient comprehended these risks.  Risks include, but are not limited to, cough, sore throat, vomiting, nausea, somnolence, esophageal and stomach trauma or perforation, bleeding, low blood pressure, aspiration, pneumonia, infection, trauma to the teeth and death.    Procedural time out performed.   During this procedure the patient was administered propofol  per anesthesia.  The patient's heart rate, blood pressure, and oxygen  saturation were monitored continuously during the procedure. The period of conscious sedation was 30 minutes, of which I was present face-to-face 100% of this time.  The transesophageal probe was inserted in the esophagus and stomach without difficulty and multiple views were obtained.  The patient was kept under observation until the patient left the procedure room.  The patient left the procedure room in stable condition.   Agitated microbubble saline contrast was administered.  COMPLICATIONS:    There were no immediate complications.  FINDINGS:   FORMAL ECHOCARDIOGRAM REPORT PENDING EF normal MR is mild-moderate, degenerative and calcified valve.  MS is at least mild-moderate, await formal report, MG 6-7 mmHg at HR 75-80 bpm. AS is at least moderate, await formal report No PFO. Trivial TR, PI   Time Spent Directly with the Patient:  60 minutes   Gloria Sharp A Tanor Glaspy 09/24/2023, 10:39 AM

## 2023-09-24 NOTE — Interval H&P Note (Signed)
 History and Physical Interval Note:  09/24/2023 9:32 AM  Gloria Sharp  has presented today for surgery, with the diagnosis of MR and AS.  The various methods of treatment have been discussed with the patient and family. After consideration of risks, benefits and other options for treatment, the patient has consented to  Procedure(s): TRANSESOPHAGEAL ECHOCARDIOGRAM (N/A) as a surgical intervention.  The patient's history has been reviewed, patient examined, no change in status, stable for surgery.  I have reviewed the patient's chart and labs.  Questions were answered to the patient's satisfaction.     Tita Terhaar A Londen Lorge

## 2023-09-25 ENCOUNTER — Encounter (HOSPITAL_COMMUNITY)
Admit: 2023-09-25 | Discharge: 2023-09-25 | Disposition: A | Discharge status: Home / Self Care | Source: Ambulatory Visit | Attending: Internal Medicine | Admitting: Internal Medicine

## 2023-09-25 ENCOUNTER — Ambulatory Visit (HOSPITAL_BASED_OUTPATIENT_CLINIC_OR_DEPARTMENT_OTHER)
Admission: RE | Admit: 2023-09-25 | Discharge: 2023-09-25 | Disposition: A | Discharge status: Home / Self Care | Source: Ambulatory Visit | Attending: Cardiovascular Disease | Admitting: Cardiovascular Disease

## 2023-09-25 ENCOUNTER — Inpatient Hospital Stay (HOSPITAL_COMMUNITY): Admission: RE | Admit: 2023-09-25 | Source: Ambulatory Visit

## 2023-09-25 ENCOUNTER — Encounter (HOSPITAL_COMMUNITY)

## 2023-09-25 VITALS — BP 147/62 | HR 71 | Temp 97.8°F | Resp 16

## 2023-09-25 DIAGNOSIS — I129 Hypertensive chronic kidney disease with stage 1 through stage 4 chronic kidney disease, or unspecified chronic kidney disease: Secondary | ICD-10-CM | POA: Diagnosis present

## 2023-09-25 DIAGNOSIS — M549 Dorsalgia, unspecified: Secondary | ICD-10-CM | POA: Diagnosis not present

## 2023-09-25 DIAGNOSIS — D509 Iron deficiency anemia, unspecified: Secondary | ICD-10-CM | POA: Diagnosis present

## 2023-09-25 DIAGNOSIS — G8929 Other chronic pain: Secondary | ICD-10-CM | POA: Diagnosis present

## 2023-09-25 DIAGNOSIS — R918 Other nonspecific abnormal finding of lung field: Secondary | ICD-10-CM | POA: Diagnosis not present

## 2023-09-25 DIAGNOSIS — R0602 Shortness of breath: Secondary | ICD-10-CM | POA: Diagnosis not present

## 2023-09-25 DIAGNOSIS — Z6841 Body Mass Index (BMI) 40.0 and over, adult: Secondary | ICD-10-CM | POA: Diagnosis not present

## 2023-09-25 DIAGNOSIS — E66813 Obesity, class 3: Secondary | ICD-10-CM | POA: Diagnosis present

## 2023-09-25 DIAGNOSIS — E1122 Type 2 diabetes mellitus with diabetic chronic kidney disease: Secondary | ICD-10-CM | POA: Diagnosis not present

## 2023-09-25 DIAGNOSIS — R6521 Severe sepsis with septic shock: Secondary | ICD-10-CM | POA: Diagnosis not present

## 2023-09-25 DIAGNOSIS — Z9049 Acquired absence of other specified parts of digestive tract: Secondary | ICD-10-CM | POA: Diagnosis not present

## 2023-09-25 DIAGNOSIS — J9601 Acute respiratory failure with hypoxia: Secondary | ICD-10-CM | POA: Diagnosis not present

## 2023-09-25 DIAGNOSIS — A419 Sepsis, unspecified organism: Secondary | ICD-10-CM | POA: Diagnosis not present

## 2023-09-25 DIAGNOSIS — I701 Atherosclerosis of renal artery: Secondary | ICD-10-CM | POA: Diagnosis not present

## 2023-09-25 DIAGNOSIS — R609 Edema, unspecified: Secondary | ICD-10-CM | POA: Diagnosis not present

## 2023-09-25 DIAGNOSIS — I10 Essential (primary) hypertension: Secondary | ICD-10-CM | POA: Diagnosis not present

## 2023-09-25 DIAGNOSIS — I447 Left bundle-branch block, unspecified: Secondary | ICD-10-CM | POA: Diagnosis not present

## 2023-09-25 DIAGNOSIS — N10 Acute tubulo-interstitial nephritis: Secondary | ICD-10-CM | POA: Diagnosis not present

## 2023-09-25 DIAGNOSIS — I3139 Other pericardial effusion (noninflammatory): Secondary | ICD-10-CM | POA: Diagnosis not present

## 2023-09-25 DIAGNOSIS — N1831 Chronic kidney disease, stage 3a: Secondary | ICD-10-CM | POA: Diagnosis not present

## 2023-09-25 DIAGNOSIS — E7800 Pure hypercholesterolemia, unspecified: Secondary | ICD-10-CM | POA: Diagnosis present

## 2023-09-25 DIAGNOSIS — J690 Pneumonitis due to inhalation of food and vomit: Secondary | ICD-10-CM | POA: Diagnosis not present

## 2023-09-25 DIAGNOSIS — I080 Rheumatic disorders of both mitral and aortic valves: Secondary | ICD-10-CM | POA: Diagnosis present

## 2023-09-25 DIAGNOSIS — Z4682 Encounter for fitting and adjustment of non-vascular catheter: Secondary | ICD-10-CM | POA: Diagnosis not present

## 2023-09-25 DIAGNOSIS — I2489 Other forms of acute ischemic heart disease: Secondary | ICD-10-CM | POA: Diagnosis not present

## 2023-09-25 DIAGNOSIS — J90 Pleural effusion, not elsewhere classified: Secondary | ICD-10-CM | POA: Diagnosis not present

## 2023-09-25 DIAGNOSIS — I2510 Atherosclerotic heart disease of native coronary artery without angina pectoris: Secondary | ICD-10-CM | POA: Diagnosis present

## 2023-09-25 DIAGNOSIS — F32A Depression, unspecified: Secondary | ICD-10-CM | POA: Diagnosis present

## 2023-09-25 DIAGNOSIS — R1084 Generalized abdominal pain: Secondary | ICD-10-CM | POA: Diagnosis not present

## 2023-09-25 DIAGNOSIS — E872 Acidosis, unspecified: Secondary | ICD-10-CM | POA: Diagnosis present

## 2023-09-25 DIAGNOSIS — I350 Nonrheumatic aortic (valve) stenosis: Secondary | ICD-10-CM

## 2023-09-25 DIAGNOSIS — I3481 Nonrheumatic mitral (valve) annulus calcification: Secondary | ICD-10-CM | POA: Diagnosis not present

## 2023-09-25 DIAGNOSIS — J189 Pneumonia, unspecified organism: Secondary | ICD-10-CM | POA: Diagnosis not present

## 2023-09-25 DIAGNOSIS — E119 Type 2 diabetes mellitus without complications: Secondary | ICD-10-CM | POA: Diagnosis not present

## 2023-09-25 DIAGNOSIS — R079 Chest pain, unspecified: Secondary | ICD-10-CM | POA: Diagnosis not present

## 2023-09-25 DIAGNOSIS — N170 Acute kidney failure with tubular necrosis: Secondary | ICD-10-CM | POA: Diagnosis not present

## 2023-09-25 DIAGNOSIS — N12 Tubulo-interstitial nephritis, not specified as acute or chronic: Secondary | ICD-10-CM | POA: Diagnosis not present

## 2023-09-25 DIAGNOSIS — I472 Ventricular tachycardia, unspecified: Secondary | ICD-10-CM | POA: Diagnosis present

## 2023-09-25 DIAGNOSIS — J984 Other disorders of lung: Secondary | ICD-10-CM | POA: Diagnosis not present

## 2023-09-25 DIAGNOSIS — D649 Anemia, unspecified: Secondary | ICD-10-CM | POA: Insufficient documentation

## 2023-09-25 DIAGNOSIS — I70202 Unspecified atherosclerosis of native arteries of extremities, left leg: Secondary | ICD-10-CM | POA: Diagnosis not present

## 2023-09-25 DIAGNOSIS — I5022 Chronic systolic (congestive) heart failure: Secondary | ICD-10-CM | POA: Insufficient documentation

## 2023-09-25 DIAGNOSIS — E785 Hyperlipidemia, unspecified: Secondary | ICD-10-CM | POA: Diagnosis not present

## 2023-09-25 DIAGNOSIS — Z01810 Encounter for preprocedural cardiovascular examination: Secondary | ICD-10-CM | POA: Diagnosis not present

## 2023-09-25 DIAGNOSIS — R109 Unspecified abdominal pain: Secondary | ICD-10-CM | POA: Diagnosis not present

## 2023-09-25 DIAGNOSIS — G4489 Other headache syndrome: Secondary | ICD-10-CM | POA: Diagnosis not present

## 2023-09-25 DIAGNOSIS — N3289 Other specified disorders of bladder: Secondary | ICD-10-CM | POA: Diagnosis not present

## 2023-09-25 DIAGNOSIS — K5790 Diverticulosis of intestine, part unspecified, without perforation or abscess without bleeding: Secondary | ICD-10-CM | POA: Diagnosis not present

## 2023-09-25 DIAGNOSIS — E1165 Type 2 diabetes mellitus with hyperglycemia: Secondary | ICD-10-CM | POA: Diagnosis present

## 2023-09-25 DIAGNOSIS — R0902 Hypoxemia: Secondary | ICD-10-CM | POA: Diagnosis not present

## 2023-09-25 DIAGNOSIS — R6520 Severe sepsis without septic shock: Secondary | ICD-10-CM | POA: Diagnosis not present

## 2023-09-25 MED ORDER — IOHEXOL 350 MG/ML SOLN
100.0000 mL | Freq: Once | INTRAVENOUS | Status: AC | PRN
  Administered 2023-09-25: 100 mL via INTRAVENOUS

## 2023-09-25 MED ORDER — IRON SUCROSE 200 MG IVPB - SIMPLE MED
200.0000 mg | Freq: Once | Status: AC
  Administered 2023-09-25: 200 mg via INTRAVENOUS
  Filled 2023-09-25: qty 200

## 2023-09-25 NOTE — Progress Notes (Signed)
 Procedure Type: Isolated AVR Perioperative Outcome Estimate % Operative Mortality 5.89% Morbidity & Mortality 23.6% Stroke 2.3% Renal Failure 8.89% Reoperation 4.17% Prolonged Ventilation 15.6% Deep Sternal Wound Infection 0.252% Long Hospital Stay (>14 days) 14.4% [facility] Stay (<6 days)* 16.5%

## 2023-09-26 ENCOUNTER — Ambulatory Visit: Payer: Self-pay | Admitting: Cardiovascular Disease

## 2023-09-27 ENCOUNTER — Encounter (HOSPITAL_COMMUNITY)

## 2023-09-27 ENCOUNTER — Inpatient Hospital Stay (HOSPITAL_COMMUNITY)
Admit: 2023-09-27 | Discharge: 2023-09-27 | Disposition: A | Discharge status: Home / Self Care | Attending: Internal Medicine

## 2023-09-27 VITALS — BP 138/55 | HR 77 | Temp 97.7°F | Resp 16

## 2023-09-27 DIAGNOSIS — D649 Anemia, unspecified: Secondary | ICD-10-CM

## 2023-09-27 DIAGNOSIS — I5022 Chronic systolic (congestive) heart failure: Secondary | ICD-10-CM

## 2023-09-27 MED ORDER — IRON SUCROSE 200 MG IVPB - SIMPLE MED
200.0000 mg | Freq: Once | Status: AC
  Administered 2023-09-27: 200 mg via INTRAVENOUS
  Filled 2023-09-27: qty 200

## 2023-09-28 ENCOUNTER — Other Ambulatory Visit: Payer: Self-pay

## 2023-09-28 ENCOUNTER — Inpatient Hospital Stay (HOSPITAL_COMMUNITY)

## 2023-09-28 ENCOUNTER — Emergency Department (HOSPITAL_COMMUNITY)

## 2023-09-28 ENCOUNTER — Inpatient Hospital Stay (HOSPITAL_COMMUNITY)
Admission: EM | Admit: 2023-09-28 | Discharge: 2023-10-03 | DRG: 871 | Disposition: A | Discharge status: Home Health | Source: Other Acute Inpatient Hospital | Attending: Family Medicine | Admitting: Family Medicine

## 2023-09-28 DIAGNOSIS — Z6841 Body Mass Index (BMI) 40.0 and over, adult: Secondary | ICD-10-CM

## 2023-09-28 DIAGNOSIS — I472 Ventricular tachycardia, unspecified: Secondary | ICD-10-CM | POA: Diagnosis present

## 2023-09-28 DIAGNOSIS — J690 Pneumonitis due to inhalation of food and vomit: Secondary | ICD-10-CM | POA: Diagnosis present

## 2023-09-28 DIAGNOSIS — Z8249 Family history of ischemic heart disease and other diseases of the circulatory system: Secondary | ICD-10-CM

## 2023-09-28 DIAGNOSIS — I2489 Other forms of acute ischemic heart disease: Secondary | ICD-10-CM | POA: Diagnosis present

## 2023-09-28 DIAGNOSIS — E872 Acidosis, unspecified: Secondary | ICD-10-CM | POA: Diagnosis present

## 2023-09-28 DIAGNOSIS — E7800 Pure hypercholesterolemia, unspecified: Secondary | ICD-10-CM | POA: Diagnosis present

## 2023-09-28 DIAGNOSIS — J9601 Acute respiratory failure with hypoxia: Principal | ICD-10-CM | POA: Diagnosis present

## 2023-09-28 DIAGNOSIS — J189 Pneumonia, unspecified organism: Secondary | ICD-10-CM | POA: Diagnosis present

## 2023-09-28 DIAGNOSIS — E1165 Type 2 diabetes mellitus with hyperglycemia: Secondary | ICD-10-CM | POA: Diagnosis present

## 2023-09-28 DIAGNOSIS — I080 Rheumatic disorders of both mitral and aortic valves: Secondary | ICD-10-CM | POA: Diagnosis present

## 2023-09-28 DIAGNOSIS — N10 Acute tubulo-interstitial nephritis: Secondary | ICD-10-CM | POA: Diagnosis present

## 2023-09-28 DIAGNOSIS — Z888 Allergy status to other drugs, medicaments and biological substances status: Secondary | ICD-10-CM

## 2023-09-28 DIAGNOSIS — G8929 Other chronic pain: Secondary | ICD-10-CM | POA: Diagnosis present

## 2023-09-28 DIAGNOSIS — N1831 Chronic kidney disease, stage 3a: Secondary | ICD-10-CM

## 2023-09-28 DIAGNOSIS — M797 Fibromyalgia: Secondary | ICD-10-CM | POA: Diagnosis present

## 2023-09-28 DIAGNOSIS — Z7984 Long term (current) use of oral hypoglycemic drugs: Secondary | ICD-10-CM

## 2023-09-28 DIAGNOSIS — N12 Tubulo-interstitial nephritis, not specified as acute or chronic: Secondary | ICD-10-CM | POA: Diagnosis present

## 2023-09-28 DIAGNOSIS — D75839 Thrombocytosis, unspecified: Secondary | ICD-10-CM | POA: Diagnosis present

## 2023-09-28 DIAGNOSIS — E538 Deficiency of other specified B group vitamins: Secondary | ICD-10-CM | POA: Diagnosis present

## 2023-09-28 DIAGNOSIS — D509 Iron deficiency anemia, unspecified: Secondary | ICD-10-CM | POA: Diagnosis present

## 2023-09-28 DIAGNOSIS — I129 Hypertensive chronic kidney disease with stage 1 through stage 4 chronic kidney disease, or unspecified chronic kidney disease: Secondary | ICD-10-CM | POA: Diagnosis present

## 2023-09-28 DIAGNOSIS — E1122 Type 2 diabetes mellitus with diabetic chronic kidney disease: Secondary | ICD-10-CM | POA: Diagnosis present

## 2023-09-28 DIAGNOSIS — R0902 Hypoxemia: Secondary | ICD-10-CM | POA: Diagnosis not present

## 2023-09-28 DIAGNOSIS — I10 Essential (primary) hypertension: Secondary | ICD-10-CM | POA: Diagnosis not present

## 2023-09-28 DIAGNOSIS — E119 Type 2 diabetes mellitus without complications: Secondary | ICD-10-CM

## 2023-09-28 DIAGNOSIS — F32A Depression, unspecified: Secondary | ICD-10-CM | POA: Diagnosis present

## 2023-09-28 DIAGNOSIS — R609 Edema, unspecified: Secondary | ICD-10-CM | POA: Diagnosis not present

## 2023-09-28 DIAGNOSIS — R0602 Shortness of breath: Secondary | ICD-10-CM | POA: Diagnosis not present

## 2023-09-28 DIAGNOSIS — R6521 Severe sepsis with septic shock: Secondary | ICD-10-CM | POA: Diagnosis present

## 2023-09-28 DIAGNOSIS — A419 Sepsis, unspecified organism: Principal | ICD-10-CM | POA: Diagnosis present

## 2023-09-28 DIAGNOSIS — Z7982 Long term (current) use of aspirin: Secondary | ICD-10-CM

## 2023-09-28 DIAGNOSIS — Z4682 Encounter for fitting and adjustment of non-vascular catheter: Secondary | ICD-10-CM | POA: Diagnosis not present

## 2023-09-28 DIAGNOSIS — D631 Anemia in chronic kidney disease: Secondary | ICD-10-CM | POA: Diagnosis present

## 2023-09-28 DIAGNOSIS — N170 Acute kidney failure with tubular necrosis: Secondary | ICD-10-CM | POA: Diagnosis present

## 2023-09-28 DIAGNOSIS — E66813 Obesity, class 3: Secondary | ICD-10-CM | POA: Diagnosis present

## 2023-09-28 DIAGNOSIS — Z79899 Other long term (current) drug therapy: Secondary | ICD-10-CM

## 2023-09-28 DIAGNOSIS — I2510 Atherosclerotic heart disease of native coronary artery without angina pectoris: Secondary | ICD-10-CM | POA: Diagnosis present

## 2023-09-28 DIAGNOSIS — B9620 Unspecified Escherichia coli [E. coli] as the cause of diseases classified elsewhere: Secondary | ICD-10-CM | POA: Diagnosis present

## 2023-09-28 DIAGNOSIS — Z823 Family history of stroke: Secondary | ICD-10-CM

## 2023-09-28 DIAGNOSIS — R918 Other nonspecific abnormal finding of lung field: Secondary | ICD-10-CM | POA: Diagnosis not present

## 2023-09-28 DIAGNOSIS — Z8673 Personal history of transient ischemic attack (TIA), and cerebral infarction without residual deficits: Secondary | ICD-10-CM

## 2023-09-28 DIAGNOSIS — I447 Left bundle-branch block, unspecified: Secondary | ICD-10-CM | POA: Diagnosis present

## 2023-09-28 DIAGNOSIS — Z7983 Long term (current) use of bisphosphonates: Secondary | ICD-10-CM

## 2023-09-28 DIAGNOSIS — Z7902 Long term (current) use of antithrombotics/antiplatelets: Secondary | ICD-10-CM

## 2023-09-28 DIAGNOSIS — E785 Hyperlipidemia, unspecified: Secondary | ICD-10-CM | POA: Diagnosis not present

## 2023-09-28 DIAGNOSIS — Z881 Allergy status to other antibiotic agents status: Secondary | ICD-10-CM

## 2023-09-28 DIAGNOSIS — M5416 Radiculopathy, lumbar region: Secondary | ICD-10-CM | POA: Diagnosis present

## 2023-09-28 DIAGNOSIS — Z833 Family history of diabetes mellitus: Secondary | ICD-10-CM

## 2023-09-28 DIAGNOSIS — Z90711 Acquired absence of uterus with remaining cervical stump: Secondary | ICD-10-CM

## 2023-09-28 DIAGNOSIS — Z9861 Coronary angioplasty status: Secondary | ICD-10-CM

## 2023-09-28 LAB — COMPREHENSIVE METABOLIC PANEL WITH GFR
ALT: 15 U/L (ref 0–44)
ALT: 13 U/L (ref 0–44)
AST: 18 U/L (ref 15–41)
AST: 22 U/L (ref 15–41)
Albumin: 3.4 g/dL — ABNORMAL LOW (ref 3.5–5.0)
Albumin: 3.2 g/dL — ABNORMAL LOW (ref 3.5–5.0)
Alkaline Phosphatase: 77 U/L (ref 38–126)
Alkaline Phosphatase: 90 U/L (ref 38–126)
Anion gap: 8 (ref 5–15)
Anion gap: 16 — ABNORMAL HIGH (ref 5–15)
BUN: 21 mg/dL (ref 8–23)
BUN: 19 mg/dL (ref 8–23)
CO2: 24 mmol/L (ref 22–32)
CO2: 16 mmol/L — ABNORMAL LOW (ref 22–32)
Calcium: 8.8 mg/dL — ABNORMAL LOW (ref 8.9–10.3)
Calcium: 8.2 mg/dL — ABNORMAL LOW (ref 8.9–10.3)
Chloride: 106 mmol/L (ref 98–111)
Chloride: 106 mmol/L (ref 98–111)
Creatinine, Ser: 1.09 mg/dL — ABNORMAL HIGH (ref 0.44–1.00)
Creatinine, Ser: 1.24 mg/dL — ABNORMAL HIGH (ref 0.44–1.00)
GFR, Estimated: 53 mL/min — ABNORMAL LOW (ref >60)
GFR, Estimated: 45 mL/min — ABNORMAL LOW (ref >60)
Glucose, Bld: 223 mg/dL — ABNORMAL HIGH (ref 70–99)
Glucose, Bld: 280 mg/dL — ABNORMAL HIGH (ref 70–99)
Potassium: 4.3 mmol/L (ref 3.5–5.1)
Potassium: 3.9 mmol/L (ref 3.5–5.1)
Sodium: 138 mmol/L (ref 135–145)
Sodium: 138 mmol/L (ref 135–145)
Total Bilirubin: 0.8 mg/dL (ref 0.0–1.2)
Total Bilirubin: 0.9 mg/dL (ref 0.0–1.2)
Total Protein: 5.9 g/dL — ABNORMAL LOW (ref 6.5–8.1)
Total Protein: 6.1 g/dL — ABNORMAL LOW (ref 6.5–8.1)

## 2023-09-28 LAB — CBC WITH DIFFERENTIAL/PLATELET
Abs Immature Granulocytes: 0.14 K/uL — ABNORMAL HIGH (ref 0.00–0.07)
Basophils Absolute: 0.1 K/uL (ref 0.0–0.1)
Basophils Absolute: 0 K/uL (ref 0.0–0.1)
Basophils Relative: 0 %
Basophils Relative: 0 %
Eosinophils Absolute: 0.2 K/uL (ref 0.0–0.5)
Eosinophils Absolute: 0.3 K/uL (ref 0.0–0.5)
Eosinophils Relative: 1 %
Eosinophils Relative: 1 %
HCT: 31.9 % — ABNORMAL LOW (ref 36.0–46.0)
HCT: 38.6 % (ref 36.0–46.0)
Hemoglobin: 9.6 g/dL — ABNORMAL LOW (ref 12.0–15.0)
Hemoglobin: 11.2 g/dL — ABNORMAL LOW (ref 12.0–15.0)
Immature Granulocytes: 1 %
Lymphocytes Relative: 4 %
Lymphocytes Relative: 6 %
Lymphs Abs: 0.7 K/uL (ref 0.7–4.0)
Lymphs Abs: 1.7 K/uL (ref 0.7–4.0)
MCH: 26 pg (ref 26.0–34.0)
MCH: 26 pg (ref 26.0–34.0)
MCHC: 30.1 g/dL (ref 30.0–36.0)
MCHC: 29 g/dL — ABNORMAL LOW (ref 30.0–36.0)
MCV: 86.4 fL (ref 80.0–100.0)
MCV: 89.6 fL (ref 80.0–100.0)
Monocytes Absolute: 1.2 K/uL — ABNORMAL HIGH (ref 0.1–1.0)
Monocytes Absolute: 1.2 K/uL — ABNORMAL HIGH (ref 0.1–1.0)
Monocytes Relative: 6 %
Monocytes Relative: 4 %
Neutro Abs: 16.6 K/uL — ABNORMAL HIGH (ref 1.7–7.7)
Neutro Abs: 25.9 K/uL — ABNORMAL HIGH (ref 1.7–7.7)
Neutrophils Relative %: 88 %
Neutrophils Relative %: 89 %
Platelets: 384 K/uL (ref 150–400)
Platelets: 493 K/uL — ABNORMAL HIGH (ref 150–400)
RBC: 3.69 MIL/uL — ABNORMAL LOW (ref 3.87–5.11)
RBC: 4.31 MIL/uL (ref 3.87–5.11)
RDW: 17.9 % — ABNORMAL HIGH (ref 11.5–15.5)
RDW: 18.2 % — ABNORMAL HIGH (ref 11.5–15.5)
WBC: 18.7 K/uL — ABNORMAL HIGH (ref 4.0–10.5)
WBC: 29.1 K/uL — ABNORMAL HIGH (ref 4.0–10.5)
nRBC: 0 % (ref 0.0–0.2)
nRBC: 0 % (ref 0.0–0.2)

## 2023-09-28 LAB — I-STAT CHEM 8, ED
BUN: 21 mg/dL (ref 8–23)
BUN: 23 mg/dL (ref 8–23)
Calcium, Ion: 1.21 mmol/L (ref 1.15–1.40)
Calcium, Ion: 1.12 mmol/L — ABNORMAL LOW (ref 1.15–1.40)
Chloride: 105 mmol/L (ref 98–111)
Chloride: 106 mmol/L (ref 98–111)
Creatinine, Ser: 1.1 mg/dL — ABNORMAL HIGH (ref 0.44–1.00)
Creatinine, Ser: 1.3 mg/dL — ABNORMAL HIGH (ref 0.44–1.00)
Glucose, Bld: 220 mg/dL — ABNORMAL HIGH (ref 70–99)
Glucose, Bld: 285 mg/dL — ABNORMAL HIGH (ref 70–99)
HCT: 31 % — ABNORMAL LOW (ref 36.0–46.0)
HCT: 38 % (ref 36.0–46.0)
Hemoglobin: 10.5 g/dL — ABNORMAL LOW (ref 12.0–15.0)
Hemoglobin: 12.9 g/dL (ref 12.0–15.0)
Potassium: 4.4 mmol/L (ref 3.5–5.1)
Potassium: 3.9 mmol/L (ref 3.5–5.1)
Sodium: 142 mmol/L (ref 135–145)
Sodium: 140 mmol/L (ref 135–145)
TCO2: 23 mmol/L (ref 22–32)
TCO2: 19 mmol/L — ABNORMAL LOW (ref 22–32)

## 2023-09-28 LAB — URINALYSIS, W/ REFLEX TO CULTURE (INFECTION SUSPECTED)
Bilirubin Urine: NEGATIVE
Glucose, UA: NEGATIVE mg/dL
Ketones, ur: NEGATIVE mg/dL
Nitrite: POSITIVE — AB
Protein, ur: 30 mg/dL — AB
Specific Gravity, Urine: 1.017 (ref 1.005–1.030)
WBC, UA: >50 WBC/hpf (ref 0–5)
pH: 5 (ref 5.0–8.0)

## 2023-09-28 LAB — I-STAT CG4 LACTIC ACID, ED
Lactic Acid, Venous: 2.4 mmol/L (ref 0.5–1.9)
Lactic Acid, Venous: 2.2 mmol/L (ref 0.5–1.9)
Lactic Acid, Venous: 6.4 mmol/L (ref 0.5–1.9)

## 2023-09-28 LAB — TROPONIN I (HIGH SENSITIVITY)
Troponin I (High Sensitivity): 18 ng/L — ABNORMAL HIGH (ref <18)
Troponin I (High Sensitivity): 28 ng/L — ABNORMAL HIGH (ref <18)

## 2023-09-28 LAB — PROTIME-INR
INR: 1.3 — ABNORMAL HIGH (ref 0.8–1.2)
Prothrombin Time: 16.6 s — ABNORMAL HIGH (ref 11.4–15.2)

## 2023-09-28 MED ORDER — LACTATED RINGERS IV BOLUS
1000.0000 mL | Freq: Once | INTRAVENOUS | Status: AC
  Administered 2023-09-28: 1000 mL via INTRAVENOUS

## 2023-09-28 MED ORDER — FENTANYL 2500MCG IN NS 250ML (10MCG/ML) PREMIX INFUSION
0.0000 ug/h | INTRAVENOUS | Status: DC
  Administered 2023-09-28: 50 ug/h via INTRAVENOUS
  Filled 2023-09-28: qty 250

## 2023-09-28 MED ORDER — SODIUM CHLORIDE 0.9 % IV SOLN
2.0000 g | Freq: Once | INTRAVENOUS | Status: AC
  Administered 2023-09-28: 2 g via INTRAVENOUS
  Filled 2023-09-28: qty 12.5

## 2023-09-28 MED ORDER — VANCOMYCIN HCL IN DEXTROSE 1-5 GM/200ML-% IV SOLN: 1000.0000 mg | Freq: Once | INTRAVENOUS | Status: DC

## 2023-09-28 MED ORDER — HEPARIN BOLUS VIA INFUSION
3000.0000 [IU] | Freq: Once | INTRAVENOUS | Status: AC
  Administered 2023-09-29: 3000 [IU] via INTRAVENOUS
  Filled 2023-09-28: qty 3000

## 2023-09-28 MED ORDER — FENTANYL CITRATE PF 50 MCG/ML IJ SOSY: 50.0000 ug | PREFILLED_SYRINGE | Freq: Once | INTRAMUSCULAR | Status: DC

## 2023-09-28 MED ORDER — NOREPINEPHRINE 4 MG/250ML-% IV SOLN
0.0000 ug/min | INTRAVENOUS | Status: DC
  Administered 2023-09-29: 5 ug/min via INTRAVENOUS
  Administered 2023-09-29: 9 ug/min via INTRAVENOUS
  Filled 2023-09-28 (×3): qty 250

## 2023-09-28 MED ORDER — TAB-A-VITE/IRON PO TABS
1.0000 | ORAL_TABLET | Freq: Every day | ORAL | Status: DC
  Administered 2023-09-29 – 2023-10-03 (×4): 1 via ORAL
  Filled 2023-09-28 (×5): qty 1

## 2023-09-28 MED ORDER — VITAMIN B-12 1000 MCG PO TABS: 500.0000 ug | ORAL_TABLET | Freq: Every day | ORAL | Status: DC

## 2023-09-28 MED ORDER — CLOPIDOGREL BISULFATE 75 MG PO TABS: 75.0000 mg | ORAL_TABLET | Freq: Every day | ORAL | Status: DC

## 2023-09-28 MED ORDER — FENTANYL CITRATE PF 50 MCG/ML IJ SOSY
50.0000 ug | PREFILLED_SYRINGE | Freq: Once | INTRAMUSCULAR | Status: AC
  Administered 2023-09-28: 50 ug via INTRAVENOUS
  Filled 2023-09-28: qty 1

## 2023-09-28 MED ORDER — ASPIRIN 81 MG PO CHEW: 81.0000 mg | CHEWABLE_TABLET | Freq: Every day | ORAL | Status: DC

## 2023-09-28 MED ORDER — NITROGLYCERIN IN D5W 200-5 MCG/ML-% IV SOLN
0.0000 ug/min | INTRAVENOUS | Status: DC
  Administered 2023-09-29: 5 ug/min via INTRAVENOUS
  Filled 2023-09-28: qty 250

## 2023-09-28 MED ORDER — CALCIUM GLUCONATE-NACL 1-0.675 GM/50ML-% IV SOLN
1.0000 g | Freq: Once | INTRAVENOUS | Status: AC
  Administered 2023-09-28: 1000 mg via INTRAVENOUS
  Filled 2023-09-28: qty 50

## 2023-09-28 MED ORDER — SODIUM CHLORIDE 0.9 % IV BOLUS (SEPSIS)
1000.0000 mL | Freq: Once | INTRAVENOUS | Status: AC
  Administered 2023-09-28: 1000 mL via INTRAVENOUS

## 2023-09-28 MED ORDER — MAGNESIUM SULFATE 2 GM/50ML IV SOLN
2.0000 g | Freq: Once | INTRAVENOUS | Status: AC
  Administered 2023-09-28: 2 g via INTRAVENOUS
  Filled 2023-09-28: qty 50

## 2023-09-28 MED ORDER — PROPOFOL 1000 MG/100ML IV EMUL
0.0000 ug/kg/min | INTRAVENOUS | Status: DC
  Administered 2023-09-28: 10 ug/kg/min via INTRAVENOUS

## 2023-09-28 MED ORDER — NITROGLYCERIN IN D5W 200-5 MCG/ML-% IV SOLN
0.0000 ug/min | INTRAVENOUS | Status: DC
  Filled 2023-09-28: qty 250

## 2023-09-28 MED ORDER — IOHEXOL 350 MG/ML SOLN
75.0000 mL | Freq: Once | INTRAVENOUS | Status: AC | PRN
  Administered 2023-09-28: 75 mL via INTRAVENOUS

## 2023-09-28 MED ORDER — FENTANYL CITRATE PF 50 MCG/ML IJ SOSY
PREFILLED_SYRINGE | INTRAMUSCULAR | Status: AC
  Administered 2023-09-28: 50 ug via INTRAVENOUS
  Filled 2023-09-28: qty 1

## 2023-09-28 MED ORDER — LACTATED RINGERS IV SOLN: INTRAVENOUS | Status: DC

## 2023-09-28 MED ORDER — ATORVASTATIN CALCIUM 80 MG PO TABS: 80.0000 mg | ORAL_TABLET | Freq: Every day | ORAL | Status: DC

## 2023-09-28 MED ORDER — CHLORHEXIDINE GLUCONATE CLOTH 2 % EX PADS
6.0000 | MEDICATED_PAD | Freq: Every day | CUTANEOUS | Status: DC
  Administered 2023-09-29 – 2023-10-01 (×3): 6 via TOPICAL

## 2023-09-28 MED ORDER — ACETAMINOPHEN 325 MG PO TABS
650.0000 mg | ORAL_TABLET | Freq: Once | ORAL | Status: AC
  Administered 2023-09-28: 650 mg via ORAL
  Filled 2023-09-28: qty 2

## 2023-09-28 MED ORDER — INSULIN ASPART 100 UNIT/ML IJ SOLN
0.0000 [IU] | INTRAMUSCULAR | Status: DC
  Administered 2023-09-29 (×2): 5 [IU] via SUBCUTANEOUS
  Administered 2023-09-29: 8 [IU] via SUBCUTANEOUS
  Administered 2023-09-29: 3 [IU] via SUBCUTANEOUS
  Administered 2023-09-29: 2 [IU] via SUBCUTANEOUS
  Administered 2023-09-29 – 2023-09-30 (×3): 3 [IU] via SUBCUTANEOUS
  Administered 2023-09-30: 1 [IU] via SUBCUTANEOUS
  Administered 2023-09-30 – 2023-10-01 (×4): 2 [IU] via SUBCUTANEOUS
  Administered 2023-10-01 (×2): 5 [IU] via SUBCUTANEOUS
  Administered 2023-10-01: 3 [IU] via SUBCUTANEOUS
  Administered 2023-10-02: 2 [IU] via SUBCUTANEOUS
  Administered 2023-10-02: 3 [IU] via SUBCUTANEOUS
  Administered 2023-10-02: 2 [IU] via SUBCUTANEOUS
  Administered 2023-10-02: 3 [IU] via SUBCUTANEOUS

## 2023-09-28 MED ORDER — ROCURONIUM BROMIDE 10 MG/ML (PF) SYRINGE
PREFILLED_SYRINGE | INTRAVENOUS | Status: AC | PRN
  Administered 2023-09-28: 70 mg via INTRAVENOUS

## 2023-09-28 MED ORDER — DOCUSATE SODIUM 50 MG/5ML PO LIQD: 100.0000 mg | Freq: Two times a day (BID) | ORAL | Status: DC | PRN

## 2023-09-28 MED ORDER — HEPARIN (PORCINE) 25000 UT/250ML-% IV SOLN
1600.0000 [IU]/h | INTRAVENOUS | Status: DC
  Administered 2023-09-29: 1000 [IU]/h via INTRAVENOUS
  Administered 2023-09-30 – 2023-10-02 (×3): 1600 [IU]/h via INTRAVENOUS
  Filled 2023-09-28 (×5): qty 250

## 2023-09-28 MED ORDER — AMIODARONE HCL 150 MG/3ML IV SOLN
INTRAVENOUS | Status: AC | PRN
  Administered 2023-09-28: 300 mg via INTRAVENOUS

## 2023-09-28 MED ORDER — PANTOPRAZOLE SODIUM 40 MG IV SOLR
40.0000 mg | Freq: Two times a day (BID) | INTRAVENOUS | Status: DC
  Administered 2023-09-28: 40 mg via INTRAVENOUS
  Filled 2023-09-28: qty 10

## 2023-09-28 MED ORDER — FENTANYL 2500MCG IN NS 250ML (10MCG/ML) PREMIX INFUSION
0.0000 ug/h | INTRAVENOUS | Status: DC
  Administered 2023-09-28: 50 ug/h via INTRAVENOUS
  Administered 2023-09-29: 100 ug/h via INTRAVENOUS

## 2023-09-28 MED ORDER — ETOMIDATE 2 MG/ML IV SOLN
INTRAVENOUS | Status: AC | PRN
  Administered 2023-09-28: 20 mg via INTRAVENOUS

## 2023-09-28 MED ORDER — POLYETHYLENE GLYCOL 3350 17 G PO PACK: 17.0000 g | PACK | Freq: Every day | ORAL | Status: DC | PRN

## 2023-09-28 MED ORDER — VANCOMYCIN HCL 1500 MG/300ML IV SOLN
1500.0000 mg | Freq: Once | INTRAVENOUS | Status: AC
  Administered 2023-09-28: 1500 mg via INTRAVENOUS
  Filled 2023-09-28: qty 300

## 2023-09-28 MED ORDER — SUCRALFATE 1 GM/10ML PO SUSP
1.0000 g | Freq: Three times a day (TID) | ORAL | Status: DC
  Administered 2023-09-29: 1 g
  Filled 2023-09-28: qty 10

## 2023-09-28 MED ORDER — FENTANYL CITRATE PF 50 MCG/ML IJ SOSY: 50.0000 ug | PREFILLED_SYRINGE | Freq: Once | INTRAMUSCULAR | Status: AC

## 2023-09-28 MED ORDER — SODIUM CHLORIDE 0.9 % IV SOLN
1.0000 g | INTRAVENOUS | Status: DC
  Administered 2023-09-29: 1 g via INTRAVENOUS
  Filled 2023-09-28: qty 10

## 2023-09-28 MED ORDER — DOCUSATE SODIUM 100 MG PO CAPS: 100.0000 mg | ORAL_CAPSULE | Freq: Two times a day (BID) | ORAL | Status: DC | PRN

## 2023-09-28 NOTE — H&P (Signed)
 NAME:  Gloria Sharp, MRN:  [id number], DOB:  1945-12-29, LOS: 0 ADMISSION DATE:  09/28/2023, CONSULTATION DATE:  09/28/23 REFERRING MD:  EDP, CHIEF COMPLAINT:  abdominal pain    History of Present Illness:  Gloria Sharp is a 78 y.o. F with PMH significant for DM, CVA, HTN, Fibromyalgia, moderate to severe AS, moderate MS and preserved EF, CAD s/p PCI L Circ and DES to distal RCA on DAPT and Asa, currently undergoing consideration for TAVR with recent anemia and EGD who was sitting in church on the day of admission and began having severe abdominal pain that radiated to the flank with chills.  She had been feeling fine up to that point.  She presented to urgent care and then was sent to the ED.   Pt was initially alert and stable and underwent CT abd/pelvis showing increased perinephric stranding surrounding the right kidney without obstructive change or calculi.  UA with leuks and bacteria and labs significant for WBC 18.7, creatinine 1.09, lactic acid 2.4.  Troponin was unremarkable at 18.  She was given 1.5L IVF  During her ED course, she suddenly vomited, developed non-sustained vtach and hypoxia requiring intubation.   EKG had some concern for ST elevation, cardiology reviewed and felt that this was BBB in the setting of sepsis.   PCCM consulted for admission in this setting.  Family reports no recent new symptoms other than pain and chills today without chest pain or shortness of breath      Pertinent  Medical History   has a past medical history of AKI (acute kidney injury) (HCC) (03/27/2021), Angina pectoris (HCC) (05/15/2022), Aortic stenosis (09/04/2019), 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 sciatica (02/12/2022), Class 3 obesity (03/26/2021), CVA (cerebral vascular accident) (HCC) (2015?), Cystocele with uterine prolapse (01/17/2018), Diabetes (HCC), Dizziness (03/27/2021), Dyslipidemia (10/10/2018), Essential hypertension (10/10/2018), Fibromyalgia, GERD (gastroesophageal reflux  disease), Heart murmur (10/10/2018), History of CVA (cerebrovascular accident) (10/10/2018), Hydronephrosis of right kidney (03/26/2021), Hypercholesterolemia, Hypertension, Hypokalemia (03/27/2021), Leukocytosis, Mitral regurgitation (11/24/2018), Nonsustained ventricular tachycardia (HCC) (11/24/2018), Obesity, Post-operative nausea and vomiting, Rectocele (01/17/2018), Sacroiliitis (HCC) (02/12/2022), Sepsis secondary to UTI (HCC) (03/26/2021), Type 2 diabetes mellitus with hyperglycemia (HCC) (03/26/2021), and Type 2 diabetes mellitus without complication, without long-term current use of insulin  (HCC) (10/10/2018).   Significant Hospital Events: Including procedures, antibiotic start and stop dates in addition to other pertinent events   9/13 admit with pyelonephritis and acute hypoxic respiratory failure   Interim History / Subjective:  BP stable   Objective    Blood pressure 121/67, pulse 78, temperature (!) 101.2 F (38.4 C), temperature source Axillary, resp. rate 16, height 5' (1.524 m), weight 102.2 kg, SpO2 (!) 14%.       No intake or output data in the 24 hours ending 09/28/23 2221 Filed Weights   09/28/23 1622  Weight: 102.2 kg    General:  obese F intubated and sedated  HEENT: MM pink/moist, ETT in place, sclera aniceteric  Neuro: examined on sedation, RASS -4 CV: s1s2 tachycardic, sinus, no m/r/g PULM:  diminished air entry in the bilateral bases without rhonchi or wheezing  GI: soft, obese Extremities: warm/dry, trace LE edema     Resolved problem list   Assessment and Plan   Sepsis secondary to UTI Acute Hypoxic Respiratory Failure  -received broad spectrum abx in the ED, continue with ceftriaxone  and follow blood and urine cultures  -check CTA chest to rule out PE, suspect flash pulmonary edema vs aspiration -received 1.5L IVF -trend troponin  -fentanyl   and propofol  for PAD protocol --Maintain full vent support with SAT/SBT as tolerated -titrate Vent  setting to maintain SpO2 greater than or equal to 90%. -HOB elevated 30 degrees. -Plateau pressures less than 30 cm H20.  -Follow chest x-ray, ABG prn.   -Bronchial hygiene and RT/bronchodilator protocol.    CAD, severe AS and MR,  HL HTN Non-sustained Vtach s/p PCI L Circ and DES to distal RCA 06/26/23 on DAPT and Asa and undergoing evaluation for TAVR, had recent TEE with preserved EF 9/9 -EKG reviewed by cardiology and felt not consistent with STEMI -trend troponin, check BNP -received amio bolus in the ED, tele and replete electrolytes prn  -monitor volume status -trend troponin, heparin  gtt, hold home beta blocker, spironolactone  and Entresto  in the setting of sepsis  -continue home statin, plavix  and asa   Fibromyalgia -hold home neurontin  and norco for now   Type 2 DM -SSI, hold metformin   Recent GIB Endoscopy on 8/28 showed 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 Fulguration to ablate the lesion to prevent bleeding by argon plasma was successful. -trend hgb and monitor for recurrence on heparin   Labs   CBC: Recent Labs  Lab 09/28/23 1641 09/28/23 1647 09/28/23 2113 09/28/23 2132  WBC 18.7*  --  29.1*  --   NEUTROABS 16.6*  --  25.9*  --   HGB 9.6* 10.5* 11.2* 12.9  HCT 31.9* 31.0* 38.6 38.0  MCV 86.4  --  89.6  --   PLT 384  --  493*  --     Basic Metabolic Panel: Recent Labs  Lab 09/28/23 1641 09/28/23 1647 09/28/23 2113 09/28/23 2132  NA 138 142 138 140  K 4.3 4.4 3.9 3.9  CL 106 105 106 106  CO2 24  --  16*  --   GLUCOSE 223* 220* 280* 285*  BUN 21 21 19 23   CREATININE 1.09* 1.10* 1.24* 1.30*  CALCIUM  8.8*  --  8.2*  --    GFR: Estimated Creatinine Clearance: 39.6 mL/min (A) (by C-G formula based on SCr of 1.3 mg/dL (H)). Recent Labs  Lab 09/28/23 1641 09/28/23 1647 09/28/23 1900 09/28/23 2113 09/28/23 2132  WBC 18.7*  --   --  29.1*  --   LATICACIDVEN  --  2.4* 2.2*  --  6.4*    Liver  Function Tests: Recent Labs  Lab 09/28/23 1641 09/28/23 2113  AST 18 22  ALT 15 13  ALKPHOS 77 90  BILITOT 0.8 0.9  PROT 5.9* 6.1*  ALBUMIN 3.4* 3.2*   No results for input(s): LIPASE, AMYLASE in the last 168 hours. No results for input(s): AMMONIA in the last 168 hours.  ABG    Component Value Date/Time   PHART 7.319 (L) 06/26/2023 1426   PCO2ART 35.8 06/26/2023 1426   PO2ART 78 (L) 06/26/2023 1426   HCO3 18.4 (L) 06/26/2023 1426   TCO2 19 (L) 09/28/2023 2132   ACIDBASEDEF 7.0 (H) 06/26/2023 1426   O2SAT 94 06/26/2023 1426     Coagulation Profile: Recent Labs  Lab 09/28/23 1641  INR 1.3*    Cardiac Enzymes: No results for input(s): CKTOTAL, CKMB, CKMBINDEX, TROPONINI in the last 168 hours.  HbA1C: Hgb A1c MFr Bld  Date/Time Value Ref Range Status  09/12/2023 05:58 AM 6.6 (H) 4.8 - 5.6 % Final    Comment:    (NOTE)         Prediabetes: 5.7 - 6.4  Diabetes: >6.4         Glycemic control for adults with diabetes: <7.0   05/15/2022 06:30 PM 5.9 (H) 4.8 - 5.6 % Final    Comment:    (NOTE) Pre diabetes:          5.7%-6.4%  Diabetes:              >6.4%  Glycemic control for   <7.0% adults with diabetes     CBG: Recent Labs  Lab 09/24/23 0854  GLUCAP 110*    Review of Systems:   Unable to obtain   Past Medical History:  She,  has a past medical history of AKI (acute kidney injury) (HCC) (03/27/2021), Angina pectoris (HCC) (05/15/2022), Aortic stenosis (09/04/2019), 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 sciatica (02/12/2022), Class 3 obesity (03/26/2021), CVA (cerebral vascular accident) (HCC) (2015?), Cystocele with uterine prolapse (01/17/2018), Diabetes (HCC), Dizziness (03/27/2021), Dyslipidemia (10/10/2018), Essential hypertension (10/10/2018), Fibromyalgia, GERD (gastroesophageal reflux disease), Heart murmur (10/10/2018), History of CVA (cerebrovascular accident) (10/10/2018), Hydronephrosis of right kidney (03/26/2021),  Hypercholesterolemia, Hypertension, Hypokalemia (03/27/2021), Leukocytosis, Mitral regurgitation (11/24/2018), Nonsustained ventricular tachycardia (HCC) (11/24/2018), Obesity, Post-operative nausea and vomiting, Rectocele (01/17/2018), Sacroiliitis (HCC) (02/12/2022), Sepsis secondary to UTI (HCC) (03/26/2021), Type 2 diabetes mellitus with hyperglycemia (HCC) (03/26/2021), and Type 2 diabetes mellitus without complication, without long-term current use of insulin  (HCC) (10/10/2018).   Surgical History:   Past Surgical History:  Procedure Laterality Date   ABDOMINAL HYSTERECTOMY     partial, left the ovaries   APPENDECTOMY     BREAST CYST ASPIRATION Right    COLONOSCOPY  11/11/2013   Colonioc polyps status post polypectomy. Pancolonic diverticulosis predominately in the sigmoid colon   CORONARY BALLOON ANGIOPLASTY N/A 06/26/2023   Procedure: CORONARY BALLOON ANGIOPLASTY;  Surgeon: Ladona Heinz, MD;  Location: [facility];  Service: Cardiovascular;  Laterality: N/A;   CYSTOSCOPY W/ URETERAL STENT PLACEMENT Right 03/26/2021   Procedure: CYSTOSCOPY WITH RETROGRADE PYELOGRAM/URETERAL STENT PLACEMENT;  Surgeon: Selma Donnice SAUNDERS, MD;  Location: [facility];  Service: Urology;  Laterality: Right;   CYSTOSCOPY/URETEROSCOPY/HOLMIUM LASER/STENT PLACEMENT Right 04/17/2021   Procedure: CYSTOSCOPY/ RETROGRADE/URETEROSCOPY/HOLMIUM LASER/STENT PLACEMENT;  Surgeon: Selma Donnice SAUNDERS, MD;  Location: [facility];  Service: Urology;  Laterality: Right;  ONLY NEEDS 60 MIN   DILATION AND CURETTAGE OF UTERUS     ENTEROSCOPY N/A 09/12/2023   Procedure: ENTEROSCOPY;  Surgeon: Leigh Elspeth SQUIBB, MD;  Location[facility];  Service: Gastroenterology;  Laterality: N/A;   GALLBLADDER SURGERY Right 07/2014   HERNIA REPAIR  02/20/2021   Umbilical   LEFT HEART CATH AND CORONARY ANGIOGRAPHY N/A 05/16/2022   Procedure: LEFT HEART CATH AND CORONARY ANGIOGRAPHY;  Surgeon: Darron Deatrice LABOR, MD;  Location: [facility];   Service: Cardiovascular;  Laterality: N/A;   RIGHT/LEFT HEART CATH AND CORONARY ANGIOGRAPHY N/A 08/30/2022   Procedure: RIGHT/LEFT HEART CATH AND CORONARY ANGIOGRAPHY;  Surgeon: Wendel Lurena POUR, MD;  Location: [facility];  Service: Cardiovascular;  Laterality: N/A;   RIGHT/LEFT HEART CATH AND CORONARY ANGIOGRAPHY N/A 06/26/2023   Procedure: RIGHT/LEFT HEART CATH AND CORONARY ANGIOGRAPHY;  Surgeon: Ladona Heinz, MD;  Location: [facility];  Service: Cardiovascular;  Laterality: N/A;   TEE WITHOUT CARDIOVERSION N/A 09/03/2022   Procedure: TRANSESOPHAGEAL ECHOCARDIOGRAM;  Surgeon: Gardenia Led, DO;  Location: [facility];  Service: Cardiovascular;  Laterality: N/A;   TRANSESOPHAGEAL ECHOCARDIOGRAM (CATH LAB) N/A 09/24/2023   Procedure: TRANSESOPHAGEAL ECHOCARDIOGRAM;  Surgeon: Loni Soyla LABOR, MD;  Location: [facility];  Service: Cardiovascular;  Laterality: N/A;   vaginal polyp removal       Social History:   reports that she has never smoked. She has never used smokeless tobacco. She reports that she does not currently use alcohol . She reports that she does not use drugs.   Family History:  Her family history includes Cancer in her mother; Diabetes in her mother; Heart disease in her father; Heart failure in her mother; Hypertension in her father and mother; Stroke in her mother. There is no history of Colon cancer, Esophageal cancer, Stomach cancer, Rectal cancer, or Colon polyps.   Allergies Allergies  Allergen Reactions   Celebrex [Celecoxib] Itching and Swelling    Swelling throughout body Skin redness    Keflex  [Cephalexin ] Itching    09/2018 note: tolerated Augmentin   Relafen [Nabumetone] Itching and Swelling   Cipro [Ciprofloxacin Hcl] Hives, Itching and Rash   Flagyl [Metronidazole] Itching, Swelling and Rash     Home Medications  Prior to Admission medications   Medication Sig Start Date End Date Taking? Authorizing Provider  acetaminophen   (TYLENOL ) 650 MG CR tablet Take 1,300 mg by mouth daily. May take an additional 650 as needed for pain    [provider]  alendronate (FOSAMAX) 35 MG tablet Take 35 mg by mouth every 7 (seven) days. Patient not taking: Reported on 09/11/2023    [provider]  aspirin  81 MG chewable tablet Chew 1 tablet (81 mg total) by mouth daily. 06/26/23   Henry Manuelita NOVAK, NP  atorvastatin  (LIPITOR ) 80 MG tablet Take 1 tablet (80 mg total) by mouth daily. 01/31/23   Krasowski, Robert J, MD  brimonidine  (ALPHAGAN ) 0.15 % ophthalmic solution Place 1 drop into both eyes 2 (two) times daily. 08/10/19   [provider]  cholestyramine  (QUESTRAN ) 4 g packet Take 1 packet (4 g total) by mouth daily. Take at least 2 hours before or after rest of the medications Patient taking differently: Take 4 g by mouth daily as needed (diarrhea). 03/25/19   Charlanne Groom, MD  clopidogrel  (PLAVIX ) 75 MG tablet Take 1 tablet (75 mg total) by mouth daily. 05/18/22   Trudy Birmingham, PA-C  cyanocobalamin  (VITAMIN B12) 1000 MCG tablet Take 1 tablet (1,000 mcg total) by mouth daily. 09/13/23   Waymond Cart, MD  ferrous sulfate  325 (65 FE) MG EC tablet Take 325 mg by mouth daily in the afternoon.    [provider]  gabapentin  (NEURONTIN ) 300 MG capsule Take 300 mg by mouth 3 (three) times daily.    [provider]  HYDROcodone -acetaminophen  (NORCO/VICODIN) 5-325 MG tablet Take 1 tablet by mouth every 4 (four) hours as needed for severe pain (pain score 7-10) or moderate pain (pain score 4-6). 09/03/23   [provider]  MAGNESIUM  PO Take 1 tablet by mouth at bedtime.    [provider]  metFORMIN (GLUCOPHAGE) 1000 MG tablet Take 1,000 mg by mouth 2 (two) times daily with a meal.    [provider]  metoprolol  succinate (TOPROL  XL) 25 MG 24 hr tablet Take 0.5 tablets (12.5 mg total) by mouth daily. Patient taking differently: Take 12.5 mg by mouth at bedtime. 06/20/23   Theadore Ozell HERO, MD  nitroGLYCERIN  (NITROSTAT ) 0.4 MG SL tablet Place 1 tablet (0.4 mg total) under the tongue every 5 (five) minutes as needed. 09/13/23   Waymond Cart, MD  Omega-3 Fatty Acids (FISH OIL PO) Take 1 capsule by mouth daily in the afternoon.    [provider]  OVER THE COUNTER MEDICATION Apply 1 patch topically daily as needed (back pain). [place] pain patch    [provider]  pantoprazole  (PROTONIX ) 40 MG tablet Take 1 tablet (40 mg total) by mouth 2 (two) times daily. 09/13/23   Waymond Cart, MD  sacubitril -valsartan  (ENTRESTO ) 24-26 MG Take 1 tablet by mouth 2 (two) times daily. 06/21/23   Krasowski, Robert J, MD  sertraline  (ZOLOFT ) 50 MG tablet Take 50 mg by mouth daily.    [provider]  spironolactone  (ALDACTONE ) 25 MG tablet Take 0.5 tablets (12.5 mg total) by mouth daily. 03/04/23   Krasowski, Robert J, MD  sucralfate  (CARAFATE ) 1 g tablet Take 1 tablet (1 g total) by mouth 3 (three) times daily. 09/13/23   Tan, Dawson, MD  traMADol (ULTRAM) 50 MG tablet Take 50 mg by mouth every 6 (six) hours as needed for moderate pain (pain score 4-6) or severe pain (pain score 7-10).    [provider]  TURMERIC PO Take 1 tablet by mouth daily in the afternoon.    [provider]     Critical care time:  50 minutes     CRITICAL CARE Performed by: Leita SAUNDERS Roxan Yamamoto   Total critical care time: 50 minutes  Critical care time was exclusive of separately billable procedures and treating other patients.  Critical care was necessary to treat or prevent imminent or life-threatening deterioration.  Critical care was time spent personally by me on the following activities: development of treatment plan with patient and/or surrogate as well as nursing, discussions with consultants, evaluation of patient's response to treatment, examination of patient, obtaining history from patient or surrogate, ordering and performing treatments and interventions, ordering  and review of laboratory studies, ordering and review of radiographic studies, pulse oximetry and re-evaluation of patient's condition.   Leita SAUNDERS Pavneet Markwood, PA-C [facility] See Amion for pager If no response to pager , please call [phone] until 7pm After 7:00 pm call Elink  [phone]

## 2023-09-28 NOTE — Code Documentation (Signed)
 Family updated as to patient's status.

## 2023-09-28 NOTE — ED Provider Notes (Signed)
 [facility] AT [facility] Provider Note   CSN: [id number] Arrival date & time: 09/28/23  1615     Patient presents with: Abdominal Pain   Gloria Sharp is a 78 y.o. female.  {Add pertinent medical, surgical, social history, OB history to YEP:67052} Patient is a 78 year old female presenting for abdominal pain.  Patient states she was in church today when she began having severe generalized abdominal pain with radiation to the right upper quadrant and right flank region.  She denies nausea or vomiting.  Denies diarrhea or constipation.  Admits to lightheadedness, subjective fevers and chills, and dizziness.  Denies dysuria, increased urgency or frequency.  Abdominal surgical history pertinent for appendectomy and cholecystectomy.  The history is provided by the patient. No language interpreter was used.  Abdominal Pain Associated symptoms: no chest pain, no chills, no cough, no dysuria, no fever, no hematuria, no shortness of breath, no sore throat and no vomiting   0 0    Prior to Admission medications   Medication Sig Start Date End Date Taking? Authorizing Provider  acetaminophen  (TYLENOL ) 650 MG CR tablet Take 1,300 mg by mouth daily. May take an additional 650 as needed for pain    [provider]  alendronate (FOSAMAX) 35 MG tablet Take 35 mg by mouth every 7 (seven) days. Patient not taking: Reported on 09/11/2023    [provider]  aspirin  81 MG chewable tablet Chew 1 tablet (81 mg total) by mouth daily. 06/26/23   Henry Manuelita NOVAK, NP  atorvastatin  (LIPITOR ) 80 MG tablet Take 1 tablet (80 mg total) by mouth daily. 01/31/23   Krasowski, Robert J, MD  brimonidine  (ALPHAGAN ) 0.15 % ophthalmic solution Place 1 drop into both eyes 2 (two) times daily. 08/10/19   [provider]  cholestyramine  (QUESTRAN ) 4 g packet Take 1 packet (4 g total) by mouth daily. Take at least 2 hours before or after rest of the medications Patient  taking differently: Take 4 g by mouth daily as needed (diarrhea). 03/25/19   Charlanne Groom, MD  clopidogrel  (PLAVIX ) 75 MG tablet Take 1 tablet (75 mg total) by mouth daily. 05/18/22   Trudy Birmingham, PA-C  cyanocobalamin  (VITAMIN B12) 1000 MCG tablet Take 1 tablet (1,000 mcg total) by mouth daily. 09/13/23   Waymond Cart, MD  ferrous sulfate  325 (65 FE) MG EC tablet Take 325 mg by mouth daily in the afternoon.    [provider]  gabapentin  (NEURONTIN ) 300 MG capsule Take 300 mg by mouth 3 (three) times daily.    [provider]  HYDROcodone -acetaminophen  (NORCO/VICODIN) 5-325 MG tablet Take 1 tablet by mouth every 4 (four) hours as needed for severe pain (pain score 7-10) or moderate pain (pain score 4-6). 09/03/23   [provider]  MAGNESIUM  PO Take 1 tablet by mouth at bedtime.    [provider]  metFORMIN (GLUCOPHAGE) 1000 MG tablet Take 1,000 mg by mouth 2 (two) times daily with a meal.    [provider]  metoprolol  succinate (TOPROL  XL) 25 MG 24 hr tablet Take 0.5 tablets (12.5 mg total) by mouth daily. Patient taking differently: Take 12.5 mg by mouth at bedtime. 06/20/23   Bero, Michael M, MD  nitroGLYCERIN  (NITROSTAT ) 0.4 MG SL tablet Place 1 tablet (0.4 mg total) under the tongue every 5 (five) minutes as needed. 09/13/23   Waymond Cart, MD  Omega-3 Fatty Acids (FISH OIL PO) Take 1 capsule by mouth daily in the afternoon.  [provider]  OVER THE COUNTER MEDICATION Apply 1 patch topically daily as needed (back pain). [place] pain patch    [provider]  pantoprazole  (PROTONIX ) 40 MG tablet Take 1 tablet (40 mg total) by mouth 2 (two) times daily. 09/13/23   Waymond Cart, MD  sacubitril -valsartan  (ENTRESTO ) 24-26 MG Take 1 tablet by mouth 2 (two) times daily. 06/21/23   Krasowski, Robert J, MD  sertraline  (ZOLOFT ) 50 MG tablet Take 50 mg by mouth daily.    [provider]  spironolactone  (ALDACTONE ) 25 MG tablet Take 0.5  tablets (12.5 mg total) by mouth daily. 03/04/23   Krasowski, Robert J, MD  sucralfate  (CARAFATE ) 1 g tablet Take 1 tablet (1 g total) by mouth 3 (three) times daily. 09/13/23   Tan, Dawson, MD  traMADol (ULTRAM) 50 MG tablet Take 50 mg by mouth every 6 (six) hours as needed for moderate pain (pain score 4-6) or severe pain (pain score 7-10).    [provider]  TURMERIC PO Take 1 tablet by mouth daily in the afternoon.    [provider]    Allergies: Celebrex [celecoxib], Keflex  [cephalexin ], Relafen [nabumetone], Cipro [ciprofloxacin hcl], and Flagyl [metronidazole]    Review of Systems  Constitutional:  Negative for chills and fever.  HENT:  Negative for ear pain and sore throat.   Eyes:  Negative for pain and visual disturbance.  Respiratory:  Negative for cough and shortness of breath.   Cardiovascular:  Negative for chest pain and palpitations.  Gastrointestinal:  Positive for abdominal pain. Negative for vomiting.  Genitourinary:  Positive for flank pain. Negative for dysuria and hematuria.  Musculoskeletal:  Negative for arthralgias and back pain.  Skin:  Negative for color change and rash.  Neurological:  Negative for seizures and syncope.  All other systems reviewed and are negative.   Updated Vital Signs BP 131/71   Pulse (!) 109   Temp 99.5 F (37.5 C) (Oral)   Resp (!) 28   Ht 5' (1.524 m)   Wt 102.2 kg   SpO2 94%   BMI 44.00 kg/m   Physical Exam Vitals and nursing note reviewed.  Constitutional:      General: She is not in acute distress.    Appearance: She is well-developed.  HENT:     Head: Normocephalic and atraumatic.  Eyes:     Conjunctiva/sclera: Conjunctivae normal.  Cardiovascular:     Rate and Rhythm: Normal rate and regular rhythm.     Heart sounds: No murmur heard. Pulmonary:     Effort: Pulmonary effort is normal. No respiratory distress.     Breath sounds: Normal breath sounds.  Abdominal:     Palpations: Abdomen is soft.      Tenderness: There is generalized abdominal tenderness and tenderness in the right upper quadrant. There is right CVA tenderness and guarding. Negative signs include Murphy's sign.     Comments: Positive right sided Lloyd sign.  Musculoskeletal:        General: No swelling.     Cervical back: Neck supple.  Skin:    General: Skin is warm and dry.     Capillary Refill: Capillary refill takes less than 2 seconds.  Neurological:     Mental Status: She is alert.  Psychiatric:        Mood and Affect: Mood normal.     (all labs ordered are listed, but only abnormal results are displayed) Labs Reviewed  COMPREHENSIVE METABOLIC PANEL WITH GFR - Abnormal; Notable for  the following components:      Result Value   Glucose, Bld 223 (*)    Creatinine, Ser 1.09 (*)    Calcium  8.8 (*)    Total Protein 5.9 (*)    Albumin 3.4 (*)    GFR, Estimated 53 (*)    All other components within normal limits  CBC WITH DIFFERENTIAL/PLATELET - Abnormal; Notable for the following components:   WBC 18.7 (*)    RBC 3.69 (*)    Hemoglobin 9.6 (*)    HCT 31.9 (*)    RDW 17.9 (*)    Neutro Abs 16.6 (*)    Monocytes Absolute 1.2 (*)    Abs Immature Granulocytes 0.14 (*)    All other components within normal limits  URINALYSIS, W/ REFLEX TO CULTURE (INFECTION SUSPECTED) - Abnormal; Notable for the following components:   APPearance CLOUDY (*)    Hgb urine dipstick LARGE (*)    Protein, ur 30 (*)    Nitrite POSITIVE (*)    Leukocytes,Ua SMALL (*)    Bacteria, UA MANY (*)    All other components within normal limits  PROTIME-INR - Abnormal; Notable for the following components:   Prothrombin Time 16.6 (*)    INR 1.3 (*)    All other components within normal limits  I-STAT CG4 LACTIC ACID, ED - Abnormal; Notable for the following components:   Lactic Acid, Venous 2.4 (*)    All other components within normal limits  I-STAT CHEM 8, ED - Abnormal; Notable for the following components:   Creatinine, Ser  1.10 (*)    Glucose, Bld 220 (*)    Hemoglobin 10.5 (*)    HCT 31.0 (*)    All other components within normal limits  CULTURE, BLOOD (ROUTINE X 2)  CULTURE, BLOOD (ROUTINE X 2)  URINE CULTURE    EKG: None  Radiology: DG Chest Portable 1 View Result Date: 09/28/2023 CLINICAL DATA:  Sepsis, chest and abdominal pain EXAM: PORTABLE CHEST 1 VIEW COMPARISON:  09/11/2023 FINDINGS: Single frontal view of the chest demonstrates an enlarged cardiac silhouette. No airspace disease, effusion, or pneumothorax. No acute bony abnormalities. IMPRESSION: 1. Stable enlarged cardiac silhouette. 2. No acute airspace disease. Electronically Signed   By: Ozell Daring M.D.   On: 09/28/2023 17:09    {Document cardiac monitor, telemetry assessment procedure when appropriate:32947} Procedures   Medications Ordered in the ED  lactated ringers  infusion ( Intravenous New Bag/Given 09/28/23 1752)  sodium chloride  0.9 % bolus 1,000 mL (1,000 mLs Intravenous New Bag/Given 09/28/23 1754)  ceFEPIme  (MAXIPIME ) 2 g in sodium chloride  0.9 % 100 mL IVPB (has no administration in time range)  acetaminophen  (TYLENOL ) tablet 650 mg (has no administration in time range)  vancomycin  (VANCOREADY) IVPB 1500 mg/300 mL (has no administration in time range)      {Click here for ABCD2, HEART and other calculators REFRESH Note before signing:1}                              Medical Decision Making Amount and/or Complexity of Data Reviewed Labs: ordered. Radiology: ordered.  Risk OTC drugs. Prescription drug management.   78 year old female presenting for right sided CVA tenderness, right sided flank pain, lightheadedness, dizziness, subjective fevers and chills.  Patient is alert and oriented x 3, no acute distress, febrile at 101.6, tachycardia, and with otherwise stable vital signs.  Her abdomen is diffusely tender with worse tenderness in the right CVA region  and right flank with a positive Lloyd sign.  Laboratory  studies concerning for leukocytosis of 18.7 and lactic acidosis of 2.4 concerning for sepsis.  Blood cultures already sent.  IV fluids started.  Tylenol  given for fever.  Rocephin  ordered for concerns for pyelonephritis.  Creatinine 1.10 with a GFR of 53.  Weight positive for urinary tract infection with greater than 50 white blood cells, many bacteria present, small leukocyte esterase, and positive nitrates.  Large amount of hemoglobin present.  Rocephin  given for pyelonephritis with recommendations for admission.  Will repeat lactic acid after IV fluid bolus is complete.  CT abdomen pelvis ordered to rule out any other etiologies at this time.   9:48 PM I was just called to the patient's bedside for acute hypoxia.  As per nursing report, the patient had came back from CT scan acting normal, asked to get up to go to the bathroom, came back to the room vomited, choked on her vomit and then became acutely hypoxic and cyanotic.  When I came to the room patient's O2 saturations were in the 30s and she was cyanotic.  She had vomited in her mouth and froth between her vocal cords.  We intubated with RSI meds etomidate  and roc.    During this time it appeared that she was in V. tach on telemetry with a rate of 187 bpm.  We started amiodarone .  Chest x-ray concerning for ards.  Patient had a chest x-ray previously just a few hours ago that was otherwise normal.  Twelve-lead EKG demonstrates some widening of her QRS as well as what looks like ST segment elevation in V2 and V3 with reciprocal depressions in the inferior leads.  I spoke with our STEMI doc who has reviewed the EKG and states this looks like a bundle branch block in the setting of tachycardia from sepsis.  Troponins are pending at this time.    Formal consult to cardiology placed for stat echocardiogram.  It does appear that patient had an echocardiogram just last week on 09/24/2023 that demonstrated stable EF of 60 to 65%.  She has received 1400 mL of  IV fluids total including the fluids from the antibiotics.  She did not report any nausea or vomiting throughout her ED stay so far.  Whether the contrast initiated this or worsening of her sepsis.  She received fentanyl  50 mcg 2 doses but the last dose was several hours ago.  I doubt this was the etiology of her emesis.  Consult to critical care was placed.  9:53 PM Repeat lactic acid sent directly after intubation 6.4. will trend after on vent for an hour. ABG sent.   Formal read on the chest x-ray demonstrates infiltrates bilaterally worse on the right suggestive of aspiration pneumonia.  {Document critical care time when appropriate  Document review of labs and clinical decision tools ie CHADS2VASC2, etc  Document your independent review of radiology images and any outside records  Document your discussion with family members, caretakers and with consultants  Document social determinants of health affecting pt's care  Document your decision making why or why not admission, treatments were needed:32947:::1}   Final diagnoses:  None    ED Discharge Orders     None

## 2023-09-28 NOTE — Progress Notes (Signed)
 PHARMACY - ANTICOAGULATION CONSULT NOTE  Pharmacy Consult for heparin  Indication: pulmonary embolus, ACS   Allergies  Allergen Reactions   Celebrex [Celecoxib] Itching and Swelling    Swelling throughout body Skin redness    Keflex  [Cephalexin ] Itching    09/2018 note: tolerated Augmentin   Relafen [Nabumetone] Itching and Swelling   Cipro [Ciprofloxacin Hcl] Hives, Itching and Rash   Flagyl [Metronidazole] Itching, Swelling and Rash    Patient Measurements: Height: 5' (152.4 cm) Weight: 102.2 kg (225 lb 5 oz) IBW/kg (Calculated) : 45.5 HEPARIN  DW (KG): 70.5  Vital Signs: Temp: 102.7 F (39.3 C) (09/13 2250) Temp Source: Axillary (09/13 1854) BP: 104/52 (09/13 2232) Pulse Rate: 118 (09/13 2250)  Labs: Recent Labs    09/28/23 1641 09/28/23 1647 09/28/23 2113 09/28/23 2132  HGB 9.6* 10.5* 11.2* 12.9  HCT 31.9* 31.0* 38.6 38.0  PLT 384  --  493*  --   LABPROT 16.6*  --   --   --   INR 1.3*  --   --   --   CREATININE 1.09* 1.10* 1.24* 1.30*  TROPONINIHS 18*  --  28*  --     Estimated Creatinine Clearance: 39.6 mL/min (A) (by C-G formula based on SCr of 1.3 mg/dL (H)).  Assessment: 23 yoF presented to ED with abdominal pain and became acutely hypoxic now intubated. Pharmacy consulted to dose heparin  for VTE. PMH includes CAD s/p PCI L Circ and DES to distal RCA on DAPT, severe AS, moderate MS (in process for TAVR evaluation), HFpEF. 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  -CBC stable -No prior anticoagulation  -Trops 28  Goal of Therapy:  Heparin  level 0.3-0.7 units/ml Monitor platelets by anticoagulation protocol: Yes   Plan:  Give 3000 units bolus x 1 given history of GI bleed Start heparin  infusion at 1000 units/hr Check anti-Xa level in 8 hours and daily while on heparin  Continue to monitor H&H and platelets F/u CTA chest for PE  Lynwood Poplar, PharmD, BCPS Clinical Pharmacist 09/28/2023 10:59 PM

## 2023-09-28 NOTE — ED Notes (Addendum)
 Pt intubated tube taped 23cm at the lip. By MD Elnor

## 2023-09-28 NOTE — Progress Notes (Signed)
   09/28/23 2300  Spiritual Encounters  Type of Visit Initial  Care provided to: [facility] partners present during encounter Nurse  Referral source Nurse (RN/NT/LPN)  OnCall Visit Yes   Chaplain received a page for support the family members. The patient was intubated. Her husband stated that she was fine in the morning when they were together at the church. After a while she felt something different, then they decided to go to urgent care. He was saddened because his wife was intubated.  He shared his work and life history. He has been ministering for 43 years in various locations. Chaplain listened to him effectively and provided emotional support. The patient's two daughters were arrived at the time.   Chaplain remains available if needed.   M.Kubra Susanna Kerry Resident [phone]

## 2023-09-28 NOTE — ED Notes (Signed)
 RT at bedside.

## 2023-09-28 NOTE — Sepsis Progress Note (Signed)
 Elink following code sepsis

## 2023-09-28 NOTE — ED Notes (Signed)
Amiodarone 300mg  IV given

## 2023-09-28 NOTE — ED Notes (Signed)
 Lab called and trop added.

## 2023-09-28 NOTE — Consult Note (Signed)
 Brief Cardiology Consult Note:  Asked to evaluate EF urgently.  Briefly, 54F admitted with abd pain, dizziness. 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  Abd CT suggests pyelonephritis.  Then the patient vomited with witnessed aspiration, intubated for hypoxia.  In this setting, heart rate escalated with wide-complex tachycardia that is unlikely to be VT, just sinus tachy with her underlying LBBB.  CXR with R>L infiltrate, which is likely the aspiration. Currently patient is intubated and sedated. Still tachycardic, but improved after amiodarone .  Bedside ultrasound with poor sound transmission, but confirmed in 3 different views that her EF is preserved, and her LV is beating hyperdynamically.  Cardiac function should not be the explanation for her CXR findings.  Recommend supportive cardiac care.  Andee Flatten, MD Cardiology on call.

## 2023-09-28 NOTE — ED Notes (Signed)
 ED Provider at bedside.

## 2023-09-28 NOTE — ED Notes (Signed)
 MD at bedside. Pt returned to ED from Scan, pt was alert and oriented, speaking to staff. Husband at bedside

## 2023-09-28 NOTE — ED Triage Notes (Signed)
 Patient arrives via [facility] from urgent care. Patient had shob, abd pain on right side in lower quadrant mainly. Patient radiates to right side of back and neck. Pain started x2.5 hours. Nausea today. Chills at UC, temp 99.3. 88 on room air, laced on 2 Liters 99%. On room air 98% with ems. Pain with taking a deep breath, intermittent in nature. Lung sounds clear. Blood transfusion this week. 12 lead Left bundle branch.   20 RAC   BP 150/60 HR 109 CBG 146

## 2023-09-28 NOTE — Progress Notes (Signed)
 Patient  intubated 23 cm at the lip, Size 7.5 ETT

## 2023-09-29 ENCOUNTER — Inpatient Hospital Stay (HOSPITAL_COMMUNITY)

## 2023-09-29 ENCOUNTER — Encounter (HOSPITAL_COMMUNITY): Payer: Self-pay | Admitting: Pulmonary Disease

## 2023-09-29 DIAGNOSIS — R918 Other nonspecific abnormal finding of lung field: Secondary | ICD-10-CM | POA: Diagnosis not present

## 2023-09-29 DIAGNOSIS — J90 Pleural effusion, not elsewhere classified: Secondary | ICD-10-CM | POA: Diagnosis not present

## 2023-09-29 DIAGNOSIS — R609 Edema, unspecified: Secondary | ICD-10-CM

## 2023-09-29 DIAGNOSIS — J984 Other disorders of lung: Secondary | ICD-10-CM | POA: Diagnosis not present

## 2023-09-29 DIAGNOSIS — N10 Acute tubulo-interstitial nephritis: Secondary | ICD-10-CM

## 2023-09-29 DIAGNOSIS — R6521 Severe sepsis with septic shock: Secondary | ICD-10-CM

## 2023-09-29 DIAGNOSIS — A419 Sepsis, unspecified organism: Principal | ICD-10-CM

## 2023-09-29 DIAGNOSIS — I3139 Other pericardial effusion (noninflammatory): Secondary | ICD-10-CM | POA: Diagnosis not present

## 2023-09-29 DIAGNOSIS — J9601 Acute respiratory failure with hypoxia: Secondary | ICD-10-CM

## 2023-09-29 LAB — POCT I-STAT 7, (LYTES, BLD GAS, ICA,H+H)
Acid-base deficit: 5 mmol/L — ABNORMAL HIGH (ref 0.0–2.0)
Bicarbonate: 22.7 mmol/L (ref 20.0–28.0)
Calcium, Ion: 1.25 mmol/L (ref 1.15–1.40)
HCT: 31 % — ABNORMAL LOW (ref 36.0–46.0)
Hemoglobin: 10.5 g/dL — ABNORMAL LOW (ref 12.0–15.0)
O2 Saturation: 100 %
Patient temperature: 37.2
Potassium: 4 mmol/L (ref 3.5–5.1)
Sodium: 140 mmol/L (ref 135–145)
TCO2: 24 mmol/L (ref 22–32)
pCO2 arterial: 56.3 mmHg — ABNORMAL HIGH (ref 32–48)
pH, Arterial: 7.215 — ABNORMAL LOW (ref 7.35–7.45)
pO2, Arterial: 250 mmHg — ABNORMAL HIGH (ref 83–108)

## 2023-09-29 LAB — ECHOCARDIOGRAM LIMITED
AR max vel: 0.71 cm2
AV Area VTI: 0.86 cm2
AV Area mean vel: 0.67 cm2
AV Mean grad: 22 mmHg
AV Peak grad: 32.5 mmHg
Ao pk vel: 2.85 m/s
Height: 60 in
S' Lateral: 4.2 cm
Weight: 3675.51 [oz_av]

## 2023-09-29 LAB — CBC
HCT: 33.1 % — ABNORMAL LOW (ref 36.0–46.0)
HCT: 30.4 % — ABNORMAL LOW (ref 36.0–46.0)
Hemoglobin: 9.9 g/dL — ABNORMAL LOW (ref 12.0–15.0)
Hemoglobin: 9.2 g/dL — ABNORMAL LOW (ref 12.0–15.0)
MCH: 25.9 pg — ABNORMAL LOW (ref 26.0–34.0)
MCH: 26.2 pg (ref 26.0–34.0)
MCHC: 29.9 g/dL — ABNORMAL LOW (ref 30.0–36.0)
MCHC: 30.3 g/dL (ref 30.0–36.0)
MCV: 86.6 fL (ref 80.0–100.0)
MCV: 86.6 fL (ref 80.0–100.0)
Platelets: 437 K/uL — ABNORMAL HIGH (ref 150–400)
Platelets: 357 K/uL (ref 150–400)
RBC: 3.82 MIL/uL — ABNORMAL LOW (ref 3.87–5.11)
RBC: 3.51 MIL/uL — ABNORMAL LOW (ref 3.87–5.11)
RDW: 18.2 % — ABNORMAL HIGH (ref 11.5–15.5)
RDW: 18.1 % — ABNORMAL HIGH (ref 11.5–15.5)
WBC: 43 K/uL — ABNORMAL HIGH (ref 4.0–10.5)
WBC: 31.2 K/uL — ABNORMAL HIGH (ref 4.0–10.5)
nRBC: 0 % (ref 0.0–0.2)
nRBC: 0 % (ref 0.0–0.2)

## 2023-09-29 LAB — HEPATIC FUNCTION PANEL
ALT: 13 U/L (ref 0–44)
AST: 19 U/L (ref 15–41)
Albumin: 2.8 g/dL — ABNORMAL LOW (ref 3.5–5.0)
Alkaline Phosphatase: 63 U/L (ref 38–126)
Bilirubin, Direct: 0.2 mg/dL (ref 0.0–0.2)
Indirect Bilirubin: 1 mg/dL — ABNORMAL HIGH (ref 0.3–0.9)
Total Bilirubin: 1.2 mg/dL (ref 0.0–1.2)
Total Protein: 4.5 g/dL — ABNORMAL LOW (ref 6.5–8.1)

## 2023-09-29 LAB — I-STAT ARTERIAL BLOOD GAS, ED
Acid-base deficit: 6 mmol/L — ABNORMAL HIGH (ref 0.0–2.0)
Bicarbonate: 21.7 mmol/L (ref 20.0–28.0)
Calcium, Ion: 1.27 mmol/L (ref 1.15–1.40)
HCT: 35 % — ABNORMAL LOW (ref 36.0–46.0)
Hemoglobin: 11.9 g/dL — ABNORMAL LOW (ref 12.0–15.0)
O2 Saturation: 98 %
Potassium: 4 mmol/L (ref 3.5–5.1)
Sodium: 140 mmol/L (ref 135–145)
TCO2: 23 mmol/L (ref 22–32)
pCO2 arterial: 49.6 mmHg — ABNORMAL HIGH (ref 32–48)
pH, Arterial: 7.249 — ABNORMAL LOW (ref 7.35–7.45)
pO2, Arterial: 117 mmHg — ABNORMAL HIGH (ref 83–108)

## 2023-09-29 LAB — BASIC METABOLIC PANEL WITH GFR
Anion gap: 13 (ref 5–15)
Anion gap: 8 (ref 5–15)
BUN: 20 mg/dL (ref 8–23)
BUN: 18 mg/dL (ref 8–23)
CO2: 20 mmol/L — ABNORMAL LOW (ref 22–32)
CO2: 20 mmol/L — ABNORMAL LOW (ref 22–32)
Calcium: 8.3 mg/dL — ABNORMAL LOW (ref 8.9–10.3)
Calcium: 8 mg/dL — ABNORMAL LOW (ref 8.9–10.3)
Chloride: 107 mmol/L (ref 98–111)
Chloride: 106 mmol/L (ref 98–111)
Creatinine, Ser: 1.23 mg/dL — ABNORMAL HIGH (ref 0.44–1.00)
Creatinine, Ser: 1.23 mg/dL — ABNORMAL HIGH (ref 0.44–1.00)
GFR, Estimated: 46 mL/min — ABNORMAL LOW (ref >60)
GFR, Estimated: 46 mL/min — ABNORMAL LOW (ref >60)
Glucose, Bld: 245 mg/dL — ABNORMAL HIGH (ref 70–99)
Glucose, Bld: 199 mg/dL — ABNORMAL HIGH (ref 70–99)
Potassium: 3.8 mmol/L (ref 3.5–5.1)
Potassium: 3.9 mmol/L (ref 3.5–5.1)
Sodium: 140 mmol/L (ref 135–145)
Sodium: 134 mmol/L — ABNORMAL LOW (ref 135–145)

## 2023-09-29 LAB — HEPARIN LEVEL (UNFRACTIONATED)
Heparin Unfractionated: 0.18 [IU]/mL — ABNORMAL LOW (ref 0.30–0.70)
Heparin Unfractionated: 0.1 [IU]/mL — ABNORMAL LOW (ref 0.30–0.70)

## 2023-09-29 LAB — TROPONIN I (HIGH SENSITIVITY)
Troponin I (High Sensitivity): 444 ng/L (ref <18)
Troponin I (High Sensitivity): 676 ng/L (ref <18)
Troponin I (High Sensitivity): 811 ng/L (ref <18)

## 2023-09-29 LAB — BRAIN NATRIURETIC PEPTIDE: B Natriuretic Peptide: 392.6 pg/mL — ABNORMAL HIGH (ref 0.0–100.0)

## 2023-09-29 LAB — GLUCOSE, CAPILLARY
Glucose-Capillary: 211 mg/dL — ABNORMAL HIGH (ref 70–99)
Glucose-Capillary: 199 mg/dL — ABNORMAL HIGH (ref 70–99)
Glucose-Capillary: 152 mg/dL — ABNORMAL HIGH (ref 70–99)
Glucose-Capillary: 213 mg/dL — ABNORMAL HIGH (ref 70–99)
Glucose-Capillary: 240 mg/dL — ABNORMAL HIGH (ref 70–99)
Glucose-Capillary: 126 mg/dL — ABNORMAL HIGH (ref 70–99)

## 2023-09-29 LAB — LACTIC ACID, PLASMA: Lactic Acid, Venous: 1.7 mmol/L (ref 0.5–1.9)

## 2023-09-29 LAB — MAGNESIUM
Magnesium: 1.5 mg/dL — ABNORMAL LOW (ref 1.7–2.4)
Magnesium: 2.6 mg/dL — ABNORMAL HIGH (ref 1.7–2.4)

## 2023-09-29 LAB — PHOSPHORUS: Phosphorus: 4.2 mg/dL (ref 2.5–4.6)

## 2023-09-29 MED ORDER — DOCUSATE SODIUM 50 MG/5ML PO LIQD: 100.0000 mg | Freq: Two times a day (BID) | ORAL | Status: DC | PRN

## 2023-09-29 MED ORDER — ONDANSETRON HCL 4 MG/2ML IJ SOLN
4.0000 mg | Freq: Four times a day (QID) | INTRAMUSCULAR | Status: DC | PRN
  Administered 2023-09-29 – 2023-09-30 (×2): 4 mg via INTRAVENOUS
  Filled 2023-09-29 (×2): qty 2

## 2023-09-29 MED ORDER — ACETAMINOPHEN 325 MG PO TABS
650.0000 mg | ORAL_TABLET | Freq: Four times a day (QID) | ORAL | Status: DC | PRN
  Administered 2023-09-29 – 2023-10-02 (×9): 650 mg via ORAL
  Filled 2023-09-29 (×9): qty 2

## 2023-09-29 MED ORDER — LACTATED RINGERS IV BOLUS
500.0000 mL | Freq: Once | INTRAVENOUS | Status: AC
  Administered 2023-09-29: 500 mL via INTRAVENOUS

## 2023-09-29 MED ORDER — MAGNESIUM SULFATE 50 % IJ SOLN
6.0000 g | Freq: Once | INTRAVENOUS | Status: AC
  Administered 2023-09-29: 6 g via INTRAVENOUS
  Filled 2023-09-29: qty 12

## 2023-09-29 MED ORDER — CLOPIDOGREL BISULFATE 75 MG PO TABS
75.0000 mg | ORAL_TABLET | Freq: Every day | ORAL | Status: DC
  Administered 2023-09-29 – 2023-10-03 (×5): 75 mg via ORAL
  Filled 2023-09-29 (×5): qty 1

## 2023-09-29 MED ORDER — VITAMIN B-12 100 MCG PO TABS
500.0000 ug | ORAL_TABLET | Freq: Every day | ORAL | Status: DC
  Administered 2023-09-29 – 2023-10-03 (×5): 500 ug via ORAL
  Filled 2023-09-29: qty 1
  Filled 2023-09-29 (×3): qty 5
  Filled 2023-09-29: qty 1

## 2023-09-29 MED ORDER — LIDOCAINE 5 % EX PTCH
1.0000 | MEDICATED_PATCH | CUTANEOUS | Status: DC
  Administered 2023-09-29 – 2023-10-02 (×4): 1 via TRANSDERMAL
  Filled 2023-09-29 (×4): qty 1

## 2023-09-29 MED ORDER — PERFLUTREN LIPID MICROSPHERE
1.0000 mL | INTRAVENOUS | Status: AC | PRN
  Administered 2023-09-29: 2 mL via INTRAVENOUS

## 2023-09-29 MED ORDER — PIPERACILLIN-TAZOBACTAM 3.375 G IVPB
3.3750 g | Freq: Three times a day (TID) | INTRAVENOUS | Status: DC
  Administered 2023-09-29 – 2023-10-01 (×8): 3.375 g via INTRAVENOUS
  Filled 2023-09-29 (×9): qty 50

## 2023-09-29 MED ORDER — IOHEXOL 350 MG/ML SOLN
75.0000 mL | Freq: Once | INTRAVENOUS | Status: AC | PRN
  Administered 2023-09-29: 75 mL via INTRAVENOUS

## 2023-09-29 MED ORDER — PANTOPRAZOLE SODIUM 40 MG PO TBEC
40.0000 mg | DELAYED_RELEASE_TABLET | Freq: Two times a day (BID) | ORAL | Status: DC
  Administered 2023-09-29 – 2023-10-03 (×9): 40 mg via ORAL
  Filled 2023-09-29 (×9): qty 1

## 2023-09-29 MED ORDER — POLYETHYLENE GLYCOL 3350 17 G PO PACK: 17.0000 g | PACK | Freq: Every day | ORAL | Status: DC | PRN

## 2023-09-29 MED ORDER — ASPIRIN 81 MG PO CHEW
81.0000 mg | CHEWABLE_TABLET | Freq: Every day | ORAL | Status: DC
  Administered 2023-09-29 – 2023-10-03 (×5): 81 mg via ORAL
  Filled 2023-09-29 (×5): qty 1

## 2023-09-29 MED ORDER — ATORVASTATIN CALCIUM 80 MG PO TABS
80.0000 mg | ORAL_TABLET | Freq: Every day | ORAL | Status: DC
  Administered 2023-09-29 – 2023-10-03 (×5): 80 mg via ORAL
  Filled 2023-09-29 (×5): qty 1

## 2023-09-29 MED ORDER — SUCRALFATE 1 GM/10ML PO SUSP
1.0000 g | Freq: Three times a day (TID) | ORAL | Status: DC
  Administered 2023-09-29 – 2023-10-03 (×12): 1 g via ORAL
  Filled 2023-09-29 (×14): qty 10

## 2023-09-29 MED ORDER — VANCOMYCIN HCL 750 MG/150ML IV SOLN
750.0000 mg | INTRAVENOUS | Status: DC
  Administered 2023-09-29: 750 mg via INTRAVENOUS
  Filled 2023-09-29: qty 150

## 2023-09-29 NOTE — Progress Notes (Signed)
 09/29/2023 Tolerated extubation CTA showing no PE, multifocal infiltrates. Progressive mobility, start diet, wean pressors. Echo to be done later today.  Rolan Sharps MD PCCM

## 2023-09-29 NOTE — Progress Notes (Signed)
 NAME:  Gloria Sharp, MRN:  [id number], DOB:  10/10/1945, LOS: 1 ADMISSION DATE:  09/28/2023, CONSULTATION DATE:  09/29/23 REFERRING MD:  EDP, CHIEF COMPLAINT:  abdominal pain    History of Present Illness:  Gloria Sharp is a 78 y.o. F with PMH significant for DM, CVA, HTN, Fibromyalgia, moderate to severe AS, moderate MS and preserved EF, CAD s/p PCI L Circ and DES to distal RCA on DAPT and Asa, currently undergoing consideration for TAVR with recent anemia and EGD who was sitting in church on the day of admission and began having severe abdominal pain that radiated to the flank with chills.  She had been feeling fine up to that point.  She presented to urgent care and then was sent to the ED.   Pt was initially alert and stable and underwent CT abd/pelvis showing increased perinephric stranding surrounding the right kidney without obstructive change or calculi.  UA with leuks and bacteria and labs significant for WBC 18.7, creatinine 1.09, lactic acid 2.4.  Troponin was unremarkable at 18.  She was given 1.5L IVF  During her ED course, she suddenly vomited, developed non-sustained vtach and hypoxia requiring intubation.   EKG had some concern for ST elevation, cardiology reviewed and felt that this was BBB in the setting of sepsis.   PCCM consulted for admission in this setting.  Family reports no recent new symptoms other than pain and chills today without chest pain or shortness of breath   Pertinent  Medical History   has a past medical history of AKI (acute kidney injury) (HCC) (03/27/2021), Angina pectoris (HCC) (05/15/2022), Aortic stenosis (09/04/2019), 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 sciatica (02/12/2022), Class 3 obesity (03/26/2021), CVA (cerebral vascular accident) (HCC) (2015?), Cystocele with uterine prolapse (01/17/2018), Diabetes (HCC), Dizziness (03/27/2021), Dyslipidemia (10/10/2018), Essential hypertension (10/10/2018), Fibromyalgia, GERD (gastroesophageal reflux disease),  Heart murmur (10/10/2018), History of CVA (cerebrovascular accident) (10/10/2018), Hydronephrosis of right kidney (03/26/2021), Hypercholesterolemia, Hypertension, Hypokalemia (03/27/2021), Leukocytosis, Mitral regurgitation (11/24/2018), Nonsustained ventricular tachycardia (HCC) (11/24/2018), Obesity, Post-operative nausea and vomiting, Rectocele (01/17/2018), Sacroiliitis (HCC) (02/12/2022), Sepsis secondary to UTI (HCC) (03/26/2021), Type 2 diabetes mellitus with hyperglycemia (HCC) (03/26/2021), and Type 2 diabetes mellitus without complication, without long-term current use of insulin  (HCC) (10/10/2018).   Significant Hospital Events: Including procedures, antibiotic start and stop dates in addition to other pertinent events   9/13 admit with pyelonephritis and acute hypoxic respiratory failure   Interim History / Subjective:  Wide awake on vent. Wants tube out  Objective    Blood pressure (!) 92/58, pulse (!) 59, temperature 98.4 F (36.9 C), temperature source Bladder, resp. rate 16, height 5' (1.524 m), weight 104.2 kg, SpO2 100%.    Vent Mode: PRVC FiO2 (%):  [60 %-100 %] 60 % Set Rate:  [20 bmp-30 bmp] 30 bmp Vt Set:  [360 mL-450 mL] 360 mL PEEP:  [12 cmH20] 12 cmH20 Plateau Pressure:  [26 cmH20-27 cmH20] 26 cmH20   Intake/Output Summary (Last 24 hours) at 09/29/2023 0716 Last data filed at 09/29/2023 0600 Gross per 24 hour  Intake 2150.26 ml  Output 200 ml  Net 1950.26 ml   Filed Weights   09/28/23 1622 09/29/23 0030 09/29/23 0453  Weight: 102.2 kg 104.2 kg 104.2 kg   No distress Moves to command Heart +murmur Abd soft Fighting vent  Mild trop leak Leukamoid rxn noted X-ray looks pretty bad  Resolved problem list   Assessment and Plan   Septic shock secondary to pyelonephritis and aspiration Acute Hypoxic Respiratory  Failure- aspiration vs flash edema improved; dyssynchronous with vent either needs heavy sedation + CVL + pressors or attempt extubation CAD,  severe AS and MR, HL, HTN, Non-sustained Vtach- s/p PCI L Circ and DES to distal RCA 06/26/23 on DAPT and Asa and undergoing evaluation for TAVR, had recent TEE with preserved EF 9/9 Fibromyalgia Type 2 DM Recent GIB- Endoscopy on 8/28 showed 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 Fulguration to ablate the lesion to prevent bleeding by argon plasma was successful.  - Check echo, if EF down may warrant another LHC prior to DC; otherwise probably going to stop heparin  with recent GIB + already on maximal medical tx - Abx to vanc/zosyn  for ARDS NOS + recent admission + pyelo - levo for SBP 100, having issues with cuff readings hopefully settles out off vent - Likely to try extubation; if fails this then we will go down the central line, arterial line, and heavy sedation + pressor route - Recheck lytes/CBC at 1300 - Family updated at bedside  42 min cc time Rolan Sharps MD PCCM

## 2023-09-29 NOTE — Progress Notes (Addendum)
 eLink Physician-Brief Progress Note Patient Name: Gloria Sharp DOB: 1945/10/03 MRN: [id number]   Date of Service  09/29/2023  HPI/Events of Note  78 year old with a history of coronary artery disease status post PCI in June 2025, metabolic syndrome, preserved EF, severe AS and previous CVA in the setting of morbid obesity who presented with right flank and abdominal pain found to have septic shock and acute hypoxic respiratory failure secondary to pyelonephritis complicated by flash pulmonary edema.  Patient is febrile, tachypneic, mildly hypotensive saturating 100% on mechanical ventilation with 100% FiO2.  Currently running propofol , fentanyl , and norepinephrine  infusions.  Results show adequate oxygenation and ventilation.  Remaining results show hyperglycemia, elevated creatinine, leukocytosis, thrombocytosis, and grossly positive urinalysis.  Perinephric stranding around the right kidney without obstructive features  eICU Interventions  Maintain antibiotics, ceftriaxone  (last culture 4/25 with E. coli sensitive).  Cultures collected.  Maintain mechanical ventilation, daily spontaneous awakening/breathing trials  Maintain crystalloid infusion, vasopressors as needed to maintain MAP greater than 65  DVT prophylaxis with SCDs, consider heparin  chemoprophylaxis GI prophylaxis with pantoprazole /sucralfate    0339 -discontinue nitroglycerin  in the setting of hypotension, continue to trend troponin  0438 -increased respiratory rate to 30.  Magnesium  supplementation in addition to the 2 g received earlier  0619 -continue Foley catheter, NG tube to low intermittent suction  Intervention Category Evaluation Type: New Patient Evaluation  [name] 09/29/2023, 1:10 AM

## 2023-09-29 NOTE — Progress Notes (Signed)
 BLE venous duplex has been completed.   Results can be found under chart review under CV PROC. 09/29/2023 4:03 PM Phylicia Mcgaugh RVT, RDMS

## 2023-09-29 NOTE — Progress Notes (Signed)
 PHARMACY - ANTICOAGULATION CONSULT NOTE  Pharmacy Consult for heparin  Indication:  ACS   Allergies  Allergen Reactions   Celebrex [Celecoxib] Itching, Swelling and Other (See Comments)    Flushing, skin redness   Keflex  [Cephalexin ] Itching    09/2018 note: tolerated Augmentin   Relafen [Nabumetone] Itching and Swelling   Cipro [Ciprofloxacin Hcl] Hives, Itching and Rash   Flagyl [Metronidazole] Itching, Swelling and Rash    Patient Measurements: Height: 5' (152.4 cm) Weight: 104.2 kg (229 lb 11.5 oz) IBW/kg (Calculated) : 45.5 HEPARIN  DW (KG): 71.1  Vital Signs: Temp: 100.4 F (38 C) (09/14 2000) Temp Source: Bladder (09/14 2000) BP: 103/41 (09/14 2100) Pulse Rate: 86 (09/14 2100)  Labs: Recent Labs    09/28/23 1641 09/28/23 1647 09/28/23 2113 09/28/23 2132 09/28/23 2359 09/29/23 0153 09/29/23 0154 09/29/23 0205 09/29/23 0457 09/29/23 0832 09/29/23 1207 09/29/23 2048  HGB 9.6*   < > 11.2* 12.9   < >  --  9.9* 10.5*  --   --  9.2*  --   HCT 31.9*   < > 38.6 38.0   < >  --  33.1* 31.0*  --   --  30.4*  --   PLT 384  --  493*  --   --   --  437*  --   --   --  357  --   LABPROT 16.6*  --   --   --   --   --   --   --   --   --   --   --   INR 1.3*  --   --   --   --   --   --   --   --   --   --   --   HEPARINUNFRC  --   --   --   --   --   --   --   --   --   --  0.18* 0.10*  CREATININE 1.09*   < > 1.24* 1.30*  --   --  1.23*  --   --   --  1.23*  --   TROPONINIHS 18*  --  28*  --   --  444*  --   --  676* 811*  --   --    < > = values in this interval not displayed.    Estimated Creatinine Clearance: 42.4 mL/min (A) (by C-G formula based on SCr of 1.23 mg/dL (H)).  Assessment: 62 yoF presented to ED with abdominal pain and became acutely hypoxic now intubated. Pharmacy consulted to dose heparin  for VTE. PMH includes CAD s/p PCI L Circ and DES to distal RCA on DAPT, severe AS, moderate MS (in process for TAVR evaluation), HFpEF. 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  Cta negative for PE  Hb down some, no bleeding noted, plt ok   Heparin  level this evening is low despite heparin  rate increase earlier today.  No known issues with IV infusion per RN report. No overt bleeding or complications noted.  Goal of Therapy:  Heparin  level 0.3-0.7 units/ml Monitor platelets by anticoagulation protocol: Yes   Plan:  Increase heparin  infusion to 1450 units/hr Check anti-Xa level in 8 hours and daily while on heparin  Continue to monitor H&H and platelets  Harlene Barlow, Berdine BIRCH, BCPS, BCCP Clinical Pharmacist  09/29/2023 9:51 PM   [facility] pharmacy phone numbers are listed on [email or web address]

## 2023-09-29 NOTE — Progress Notes (Signed)
 PHARMACY - ANTICOAGULATION CONSULT NOTE  Pharmacy Consult for heparin  Indication:  ACS   Allergies  Allergen Reactions   Celebrex [Celecoxib] Itching and Swelling    Swelling throughout body Skin redness    Keflex  [Cephalexin ] Itching    09/2018 note: tolerated Augmentin   Relafen [Nabumetone] Itching and Swelling   Cipro [Ciprofloxacin Hcl] Hives, Itching and Rash   Flagyl [Metronidazole] Itching, Swelling and Rash    Patient Measurements: Height: 5' (152.4 cm) Weight: 104.2 kg (229 lb 11.5 oz) IBW/kg (Calculated) : 45.5 HEPARIN  DW (KG): 71.1  Vital Signs: Temp: 98.2 F (36.8 C) (09/14 1126) Temp Source: Bladder (09/14 1126) BP: 142/46 (09/14 1200) Pulse Rate: 82 (09/14 1200)  Labs: Recent Labs    09/28/23 1641 09/28/23 1647 09/28/23 2113 09/28/23 2132 09/28/23 2359 09/29/23 0153 09/29/23 0154 09/29/23 0205 09/29/23 0457 09/29/23 0832 09/29/23 1207  HGB 9.6*   < > 11.2* 12.9   < >  --  9.9* 10.5*  --   --  9.2*  HCT 31.9*   < > 38.6 38.0   < >  --  33.1* 31.0*  --   --  30.4*  PLT 384  --  493*  --   --   --  437*  --   --   --  357  LABPROT 16.6*  --   --   --   --   --   --   --   --   --   --   INR 1.3*  --   --   --   --   --   --   --   --   --   --   HEPARINUNFRC  --   --   --   --   --   --   --   --   --   --  0.18*  CREATININE 1.09*   < > 1.24* 1.30*  --   --  1.23*  --   --   --   --   TROPONINIHS 18*  --  28*  --   --  444*  --   --  676* 811*  --    < > = values in this interval not displayed.    Estimated Creatinine Clearance: 42.4 mL/min (A) (by C-G formula based on SCr of 1.23 mg/dL (H)).  Assessment: 89 yoF presented to ED with abdominal pain and became acutely hypoxic now intubated. Pharmacy consulted to dose heparin  for VTE. PMH includes CAD s/p PCI L Circ and DES to distal RCA on DAPT, severe AS, moderate MS (in process for TAVR evaluation), HFpEF. 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  Cta  negative for PE  Hb down some, no bleeding noted, plt ok   Goal of Therapy:  Heparin  level 0.3-0.7 units/ml Monitor platelets by anticoagulation protocol: Yes   Plan:  Increase heparin  infusion to 1250 units/hr Check anti-Xa level in 8 hours and daily while on heparin  Continue to monitor H&H and platelets  Rankin Sams, PharmD, BCPS, BCCCP Clinical Pharmacist

## 2023-09-29 NOTE — Progress Notes (Signed)
 Pharmacy Antibiotic Note  Gloria Sharp is a 78 y.o. female admitted on 09/28/2023 with Septic shock secondary to pyelonephritis and aspiration.  Pharmacy has been consulted for vancomycin  / zosyn  dosing.  Baseline Scr ~0.9. Current Scr 1.23. Norepinephrine  @ 10. CrCl estimated 42. BMI 45. Vd 0.5.  Vancomycin  750 mg q24h for AUC of 521.   Plan: Vancomycin  1500 mg x1 yesterday at 19:00  Vancomycin  750 mg q24h starting tonight  Zosyn  3.375 q8h EI  Follow cultures, MRSA nares, clinical progression   Height: 5' (152.4 cm) Weight: 104.2 kg (229 lb 11.5 oz) IBW/kg (Calculated) : 45.5  Temp (24hrs), Avg:100.3 F (37.9 C), Min:98.2 F (36.8 C), Max:102.7 F (39.3 C)  Recent Labs  Lab 09/28/23 1641 09/28/23 1647 09/28/23 1900 09/28/23 2113 09/28/23 2132 09/29/23 0154  WBC 18.7*  --   --  29.1*  --  43.0*  CREATININE 1.09* 1.10*  --  1.24* 1.30* 1.23*  LATICACIDVEN  --  2.4* 2.2*  --  6.4* 1.7    Estimated Creatinine Clearance: 42.4 mL/min (A) (by C-G formula based on SCr of 1.23 mg/dL (H)).    Allergies  Allergen Reactions   Celebrex [Celecoxib] Itching and Swelling    Swelling throughout body Skin redness    Keflex  [Cephalexin ] Itching    09/2018 note: tolerated Augmentin   Relafen [Nabumetone] Itching and Swelling   Cipro [Ciprofloxacin Hcl] Hives, Itching and Rash   Flagyl [Metronidazole] Itching, Swelling and Rash    Antimicrobials this admission: Cefepime  9/13 Ceftriaxone  9/14 Vancomycin  9/13 >> Zosyn  9/14 >>    Microbiology results: 9/13 Ucx: 9/13 Bcx: 9/14 MRSA nares: pending   Thank you for allowing pharmacy to be a part of this patient's care.  Rankin Sams 09/29/2023 7:51 AM

## 2023-09-29 NOTE — Progress Notes (Signed)
 Patient extubated per MD's order and placed on 3L [place] with RT and RN at bedside. Cuff leak present prior to extubating, no stridor noted post. Vitals stable.

## 2023-09-29 NOTE — Plan of Care (Signed)
  Problem: Fluid Volume: Goal: Ability to maintain a balanced intake and output will improve Outcome: Progressing   Problem: Metabolic: Goal: Ability to maintain appropriate glucose levels will improve Outcome: Progressing   Problem: Nutritional: Goal: Maintenance of adequate nutrition will improve Outcome: Progressing   Problem: Skin Integrity: Goal: Risk for impaired skin integrity will decrease Outcome: Progressing   Problem: Tissue Perfusion: Goal: Adequacy of tissue perfusion will improve Outcome: Progressing   Problem: Clinical Measurements: Goal: Will remain free from infection Outcome: Progressing   Problem: Clinical Measurements: Goal: Diagnostic test results will improve Outcome: Progressing   Problem: Clinical Measurements: Goal: Respiratory complications will improve Outcome: Progressing   Problem: Clinical Measurements: Goal: Cardiovascular complication will be avoided Outcome: Progressing   Problem: Elimination: Goal: Will not experience complications related to bowel motility Outcome: Progressing   Problem: Elimination: Goal: Will not experience complications related to urinary retention Outcome: Progressing   Problem: Pain Managment: Goal: General experience of comfort will improve and/or be controlled Outcome: Progressing   Problem: Safety: Goal: Ability to remain free from injury will improve Outcome: Progressing   Problem: Skin Integrity: Goal: Risk for impaired skin integrity will decrease Outcome: Progressing    Plan of care, assessment, monitoring, treatment, and intervention (s) ongoing, see MAR see flowsheet

## 2023-09-29 NOTE — Plan of Care (Signed)
 Extubated. Weaned to 4L [place] now RA. Diet obtained. CT abd, US , and ECHO completed. UOP adequate. Family visited. Rechecked mag 2.1 from 1.5 post replacement.  Problem: Education: Goal: Ability to describe self-care measures that may prevent or decrease complications (Diabetes Survival Skills Education) will improve Outcome: Progressing Goal: Individualized Educational Video(s) Outcome: Progressing   Problem: Coping: Goal: Ability to adjust to condition or change in health will improve Outcome: Progressing   Problem: Fluid Volume: Goal: Ability to maintain a balanced intake and output will improve Outcome: Progressing   Problem: Health Behavior/Discharge Planning: Goal: Ability to identify and utilize available resources and services will improve Outcome: Progressing Goal: Ability to manage health-related needs will improve Outcome: Progressing   Problem: Metabolic: Goal: Ability to maintain appropriate glucose levels will improve Outcome: Progressing   Problem: Nutritional: Goal: Maintenance of adequate nutrition will improve Outcome: Progressing Goal: Progress toward achieving an optimal weight will improve Outcome: Progressing   Problem: Skin Integrity: Goal: Risk for impaired skin integrity will decrease Outcome: Progressing   Problem: Tissue Perfusion: Goal: Adequacy of tissue perfusion will improve Outcome: Progressing   Problem: Education: Goal: Knowledge of General Education information will improve Description: Including pain rating scale, medication(s)/side effects and non-pharmacologic comfort measures Outcome: Progressing   Problem: Health Behavior/Discharge Planning: Goal: Ability to manage health-related needs will improve Outcome: Progressing   Problem: Clinical Measurements: Goal: Ability to maintain clinical measurements within normal limits will improve Outcome: Progressing Goal: Will remain free from infection Outcome: Progressing Goal: Diagnostic  test results will improve Outcome: Progressing Goal: Respiratory complications will improve Outcome: Progressing Goal: Cardiovascular complication will be avoided Outcome: Progressing   Problem: Activity: Goal: Risk for activity intolerance will decrease Outcome: Progressing   Problem: Nutrition: Goal: Adequate nutrition will be maintained Outcome: Progressing   Problem: Coping: Goal: Level of anxiety will decrease Outcome: Progressing   Problem: Elimination: Goal: Will not experience complications related to bowel motility Outcome: Progressing Goal: Will not experience complications related to urinary retention Outcome: Progressing   Problem: Pain Managment: Goal: General experience of comfort will improve and/or be controlled Outcome: Progressing   Problem: Safety: Goal: Ability to remain free from injury will improve Outcome: Progressing   Problem: Skin Integrity: Goal: Risk for impaired skin integrity will decrease Outcome: Progressing

## 2023-09-30 ENCOUNTER — Inpatient Hospital Stay (HOSPITAL_COMMUNITY)
Admit: 2023-09-30 | Discharge: 2023-09-30 | Disposition: A | Discharge status: Home / Self Care | Attending: Internal Medicine | Admitting: Internal Medicine

## 2023-09-30 ENCOUNTER — Encounter (HOSPITAL_COMMUNITY)

## 2023-09-30 DIAGNOSIS — I10 Essential (primary) hypertension: Secondary | ICD-10-CM

## 2023-09-30 DIAGNOSIS — E785 Hyperlipidemia, unspecified: Secondary | ICD-10-CM

## 2023-09-30 LAB — BASIC METABOLIC PANEL WITH GFR
Anion gap: 9 (ref 5–15)
BUN: 16 mg/dL (ref 8–23)
CO2: 23 mmol/L (ref 22–32)
Calcium: 7.9 mg/dL — ABNORMAL LOW (ref 8.9–10.3)
Chloride: 105 mmol/L (ref 98–111)
Creatinine, Ser: 1.3 mg/dL — ABNORMAL HIGH (ref 0.44–1.00)
GFR, Estimated: 43 mL/min — ABNORMAL LOW (ref >60)
Glucose, Bld: 154 mg/dL — ABNORMAL HIGH (ref 70–99)
Potassium: 4 mmol/L (ref 3.5–5.1)
Sodium: 137 mmol/L (ref 135–145)

## 2023-09-30 LAB — CBC
HCT: 27.3 % — ABNORMAL LOW (ref 36.0–46.0)
Hemoglobin: 8.3 g/dL — ABNORMAL LOW (ref 12.0–15.0)
MCH: 26.1 pg (ref 26.0–34.0)
MCHC: 30.4 g/dL (ref 30.0–36.0)
MCV: 85.8 fL (ref 80.0–100.0)
Platelets: 271 K/uL (ref 150–400)
RBC: 3.18 MIL/uL — ABNORMAL LOW (ref 3.87–5.11)
RDW: 18.6 % — ABNORMAL HIGH (ref 11.5–15.5)
WBC: 16.5 K/uL — ABNORMAL HIGH (ref 4.0–10.5)
nRBC: 0 % (ref 0.0–0.2)

## 2023-09-30 LAB — URINE CULTURE: Culture: >=100000 — AB

## 2023-09-30 LAB — HEPARIN LEVEL (UNFRACTIONATED)
Heparin Unfractionated: 0.28 [IU]/mL — ABNORMAL LOW (ref 0.30–0.70)
Heparin Unfractionated: 0.36 [IU]/mL (ref 0.30–0.70)

## 2023-09-30 LAB — PHOSPHORUS: Phosphorus: 3.1 mg/dL (ref 2.5–4.6)

## 2023-09-30 LAB — MRSA NEXT GEN BY PCR, NASAL: MRSA by PCR Next Gen: NOT DETECTED

## 2023-09-30 LAB — GLUCOSE, CAPILLARY
Glucose-Capillary: 121 mg/dL — ABNORMAL HIGH (ref 70–99)
Glucose-Capillary: 135 mg/dL — ABNORMAL HIGH (ref 70–99)
Glucose-Capillary: 141 mg/dL — ABNORMAL HIGH (ref 70–99)
Glucose-Capillary: 146 mg/dL — ABNORMAL HIGH (ref 70–99)
Glucose-Capillary: 151 mg/dL — ABNORMAL HIGH (ref 70–99)
Glucose-Capillary: 168 mg/dL — ABNORMAL HIGH (ref 70–99)

## 2023-09-30 LAB — MAGNESIUM: Magnesium: 1.8 mg/dL (ref 1.7–2.4)

## 2023-09-30 LAB — TROPONIN I (HIGH SENSITIVITY): Troponin I (High Sensitivity): 278 ng/L (ref <18)

## 2023-09-30 MED ORDER — LACTATED RINGERS IV BOLUS
500.0000 mL | Freq: Once | INTRAVENOUS | Status: AC
  Administered 2023-09-30: 500 mL via INTRAVENOUS

## 2023-09-30 MED ORDER — LINEZOLID 600 MG PO TABS
600.0000 mg | ORAL_TABLET | Freq: Two times a day (BID) | ORAL | Status: DC
  Filled 2023-09-30: qty 1

## 2023-09-30 NOTE — Plan of Care (Addendum)
 Patient remains on infusion of heparin . Plan to transfer to [facility] overnight tonight.  Edit: patient Sent with Gabe, RN to transport to [facility] at this time.  Problem: Education: Goal: Ability to describe self-care measures that may prevent or decrease complications (Diabetes Survival Skills Education) will improve 09/30/2023 2213 by Teresa Olita RAMAN, RN Outcome: Progressing 09/30/2023 2208 by Teresa Olita RAMAN, RN Outcome: Progressing Goal: Individualized Educational Video(s) 09/30/2023 2213 by Teresa Olita RAMAN, RN Outcome: Progressing 09/30/2023 2208 by Teresa Olita RAMAN, RN Outcome: Progressing   Problem: Coping: Goal: Ability to adjust to condition or change in health will improve 09/30/2023 2213 by Teresa Olita RAMAN, RN Outcome: Progressing 09/30/2023 2208 by Teresa Olita RAMAN, RN Outcome: Progressing   Problem: Fluid Volume: Goal: Ability to maintain a balanced intake and output will improve 09/30/2023 2213 by Teresa Olita RAMAN, RN Outcome: Progressing 09/30/2023 2208 by Teresa Olita RAMAN, RN Outcome: Progressing   Problem: Health Behavior/Discharge Planning: Goal: Ability to identify and utilize available resources and services will improve 09/30/2023 2213 by Teresa Olita RAMAN, RN Outcome: Progressing 09/30/2023 2208 by Teresa Olita RAMAN, RN Outcome: Progressing Goal: Ability to manage health-related needs will improve 09/30/2023 2213 by Teresa Olita RAMAN, RN Outcome: Progressing 09/30/2023 2208 by Teresa Olita RAMAN, RN Outcome: Progressing   Problem: Metabolic: Goal: Ability to maintain appropriate glucose levels will improve 09/30/2023 2213 by Teresa Olita RAMAN, RN Outcome: Progressing 09/30/2023 2208 by Teresa Olita RAMAN, RN Outcome: Progressing   Problem: Nutritional: Goal: Maintenance of adequate nutrition will improve 09/30/2023 2213 by Teresa Olita RAMAN, RN Outcome: Progressing 09/30/2023 2208 by Teresa Olita RAMAN, RN Outcome: Progressing Goal: Progress toward achieving an optimal weight will  improve 09/30/2023 2213 by Teresa Olita RAMAN, RN Outcome: Progressing 09/30/2023 2208 by Teresa Olita RAMAN, RN Outcome: Progressing   Problem: Skin Integrity: Goal: Risk for impaired skin integrity will decrease 09/30/2023 2213 by Teresa Olita RAMAN, RN Outcome: Progressing 09/30/2023 2208 by Teresa Olita RAMAN, RN Outcome: Progressing   Problem: Tissue Perfusion: Goal: Adequacy of tissue perfusion will improve 09/30/2023 2213 by Teresa Olita RAMAN, RN Outcome: Progressing 09/30/2023 2208 by Teresa Olita RAMAN, RN Outcome: Progressing   Problem: Education: Goal: Knowledge of General Education information will improve Description: Including pain rating scale, medication(s)/side effects and non-pharmacologic comfort measures 09/30/2023 2213 by Teresa Olita RAMAN, RN Outcome: Progressing 09/30/2023 2208 by Teresa Olita RAMAN, RN Outcome: Progressing   Problem: Health Behavior/Discharge Planning: Goal: Ability to manage health-related needs will improve 09/30/2023 2213 by Teresa Olita RAMAN, RN Outcome: Progressing 09/30/2023 2208 by Teresa Olita RAMAN, RN Outcome: Progressing   Problem: Clinical Measurements: Goal: Ability to maintain clinical measurements within normal limits will improve 09/30/2023 2213 by Teresa Olita RAMAN, RN Outcome: Progressing 09/30/2023 2208 by Teresa Olita RAMAN, RN Outcome: Progressing Goal: Will remain free from infection 09/30/2023 2213 by Teresa Olita RAMAN, RN Outcome: Progressing 09/30/2023 2208 by Teresa Olita RAMAN, RN Outcome: Progressing Goal: Diagnostic test results will improve 09/30/2023 2213 by Teresa Olita RAMAN, RN Outcome: Progressing 09/30/2023 2208 by Teresa Olita RAMAN, RN Outcome: Progressing Goal: Respiratory complications will improve 09/30/2023 2213 by Teresa Olita RAMAN, RN Outcome: Progressing 09/30/2023 2208 by Teresa Olita RAMAN, RN Outcome: Progressing Goal: Cardiovascular complication will be avoided 09/30/2023 2213 by Teresa Olita RAMAN, RN Outcome: Progressing 09/30/2023 2208  by Teresa Olita RAMAN, RN Outcome: Progressing   Problem: Activity: Goal: Risk for activity intolerance will decrease 09/30/2023 2213 by Teresa Olita RAMAN, RN Outcome: Progressing 09/30/2023 2208 by Teresa Olita RAMAN, RN Outcome: Progressing  Problem: Nutrition: Goal: Adequate nutrition will be maintained 09/30/2023 2213 by Teresa Olita RAMAN, RN Outcome: Progressing 09/30/2023 2208 by Teresa Olita RAMAN, RN Outcome: Progressing   Problem: Coping: Goal: Level of anxiety will decrease 09/30/2023 2213 by Teresa Olita RAMAN, RN Outcome: Progressing 09/30/2023 2208 by Teresa Olita RAMAN, RN Outcome: Progressing   Problem: Elimination: Goal: Will not experience complications related to bowel motility 09/30/2023 2213 by Teresa Olita RAMAN, RN Outcome: Progressing 09/30/2023 2208 by Teresa Olita RAMAN, RN Outcome: Progressing Goal: Will not experience complications related to urinary retention 09/30/2023 2213 by Teresa Olita RAMAN, RN Outcome: Progressing 09/30/2023 2208 by Teresa Olita RAMAN, RN Outcome: Progressing   Problem: Pain Managment: Goal: General experience of comfort will improve and/or be controlled 09/30/2023 2213 by Teresa Olita RAMAN, RN Outcome: Progressing 09/30/2023 2208 by Teresa Olita RAMAN, RN Outcome: Progressing   Problem: Safety: Goal: Ability to remain free from injury will improve 09/30/2023 2213 by Teresa Olita RAMAN, RN Outcome: Progressing 09/30/2023 2208 by Teresa Olita RAMAN, RN Outcome: Progressing   Problem: Skin Integrity: Goal: Risk for impaired skin integrity will decrease 09/30/2023 2213 by Teresa Olita RAMAN, RN Outcome: Progressing 09/30/2023 2208 by Teresa Olita RAMAN, RN Outcome: Progressing   Problem: Fluid Volume: Goal: Hemodynamic stability will improve Outcome: Progressing   Problem: Clinical Measurements: Goal: Diagnostic test results will improve Outcome: Progressing Goal: Signs and symptoms of infection will decrease Outcome: Progressing   Problem: Respiratory: Goal:  Ability to maintain adequate ventilation will improve Outcome: Progressing

## 2023-09-30 NOTE — Progress Notes (Signed)
 NAME:  Gloria Sharp, MRN:  [id number], DOB:  May 03, 1945, LOS: 2 ADMISSION DATE:  09/28/2023, CONSULTATION DATE: 09/28/2023 REFERRING MD: Elnor - EDP, CHIEF COMPLAINT: Abdominal pain    History of Present Illness:  78 year old woman who presented to [facility] 9/13 for abdominal pain. PMHx significant for HTN, CAD s/p PCI (LCx, DES to distal RCA on DAPT), moderate-to-severe AS (under consideration for TAVR), CVA, anemia, T2DM, fibromyalgia.  Patient was reportedly sitting in church on the day of admission and began having severe abdominal pain radiating to the flank with associated chills.  She had been feeling fine up to that point. Family reports no recent new symptoms other than pain and chills without chest pain or shortness of breath. Patient presented to urgent care and was subsequently sent to the ED. Pt was initially alert and stable; underwent CT A/P showing increased perinephric stranding surrounding the right kidney without obstructive change or calculi.  UA +leuks and bacteria. Labs were notable for WBC 18.7, Cr 1.09, LA 2.4.  Troponin 18.  She was given 1.5L IVF.  During patient's ED course, patient suddenly vomited then developed non-sustained VT with hypoxia requiring intubation. EKG had some concern for ST elevation, Cardiology reviewed and felt that this was BBB in the setting of sepsis.  PCCM consulted for admission.  Pertinent Medical History:   Past Medical History:  Diagnosis Date   AKI (acute kidney injury) (HCC) 03/27/2021   Angina pectoris (HCC) 05/15/2022   Aortic stenosis 09/04/2019   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 sciatica 02/12/2022   Class 3 obesity 03/26/2021   CVA (cerebral vascular accident) (HCC) 2015?   Cystocele with uterine prolapse 01/17/2018   Diabetes (HCC)    Dizziness 03/27/2021   Dyslipidemia 10/10/2018   Essential hypertension 10/10/2018   Fibromyalgia    GERD (gastroesophageal reflux disease)    Heart murmur 10/10/2018   History of CVA  (cerebrovascular accident) 10/10/2018   Hydronephrosis of right kidney 03/26/2021   Hypercholesterolemia    Hypertension    Hypokalemia 03/27/2021   Leukocytosis    Mitral regurgitation 11/24/2018   Nonsustained ventricular tachycardia (HCC) 11/24/2018   Post-operative nausea and vomiting    hard to wake up   Rectocele 01/17/2018   Sacroiliitis (HCC) 02/12/2022   Sepsis secondary to UTI (HCC) 03/26/2021   Type 2 diabetes mellitus with hyperglycemia (HCC) 03/26/2021   Type 2 diabetes mellitus without complication, without long-term current use of insulin  (HCC) 10/10/2018   Significant Hospital Events: Including procedures, antibiotic start and stop dates in addition to other pertinent events   9/13 Admit with pyelonephritis and acute hypoxic respiratory failure   Interim History / Subjective:  No significant events overnight Extubated yesterday, tolerated well Up to bedside commode with assistance Energy is improving, just feels stiff from laying in bed Trop with slow uptrend, Echo with EF down ~10% from prior and new RWMAs Cards consulted  Objective:   Blood pressure (!) 111/43, pulse 69, temperature 98.6 F (37 C), temperature source Bladder, resp. rate 18, height 5' (1.524 m), weight 106 kg, SpO2 96%.        Intake/Output Summary (Last 24 hours) at 09/30/2023 0726 Last data filed at 09/30/2023 0700 Gross per 24 hour  Intake 1766.8 ml  Output 1320 ml  Net 446.8 ml   Filed Weights   09/29/23 0030 09/29/23 0453 09/30/23 0406  Weight: 104.2 kg 104.2 kg 106 kg   Physical Examination: General: Overall well-appearing older woman in NAD. Pleasant and conversant. HEENT: [place]/AT, anicteric  sclera, PERRL, moist mucous membranes. Neuro: Awake, oriented x 4. Responds to verbal stimuli. Following commands consistently. Moves all 4 extremities spontaneously.  CV: RRR, no m/g/r. PULM: Breathing even and unlabored on 2LNC. Lung fields diminished at bases bilaterally. GI: Obese,  soft, nontender, nondistended. Normoactive bowel sounds. Extremities: Trace bilateral symmetric LE edema noted. Skin: Warm/dry, no rashes.  Resolved Problem List:   Assessment and Plan:  Septic shock secondary to pyelonephritis and aspiration, improved - Goal MAP > 65 - Fluid resuscitation as tolerated - No longer requiring vasopressors to maintain goal MAP - Trend WBC, fever curve - F/u Cx data - Continue broad-spectrum antibiotics (Vanc/Zosyn )  Acute Hypoxic Respiratory Failure, improved CTA Chest showing no PE, multifocal infiltrates. Extubated 9/14. Aspiration vs. flash edema improved. - Supplemental O2 support for goal SpO2 > 90% - Pulmonary hygiene - Optimize volume status as able  CAD, severe AS and MR Non-sustained Vtach HTN HLD S/p PCI L Circ and DES to distal RCA 06/26/23 on DAPT and Asa and undergoing evaluation for TAVR, had recent TEE with preserved EF 9/9. Repeat Echo with EF decreased ~10-15%, new RWMAs. 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 Fulguration to ablate the lesion to prevent bleeding by argon plasma was successful. - Trend H&H - Monitor for signs of active bleeding, none at present - Transfuse for Hgb < 7.0 or hemodynamically significant bleeding  Likely stable for transfer out of ICU (today 9/15 versus tomorrow 9/16) once Cardiology recommendations in place.  Critical care time: N/A   Corean CHRISTELLA Ilah DEVONNA [facility] 09/30/23 12:11 PM  Please see [email or web address] for pager details.  From 7A-7P if no response, please call [phone] After hours, please call ELink [phone]

## 2023-09-30 NOTE — Anesthesia Postprocedure Evaluation (Signed)
 Anesthesia Post Note  Patient: Gloria Sharp  Procedure(s) Performed: TRANSESOPHAGEAL ECHOCARDIOGRAM     Patient location during evaluation: Cath Lab Anesthesia Type: MAC Level of consciousness: awake and alert Pain management: pain level controlled Vital Signs Assessment: post-procedure vital signs reviewed and stable Respiratory status: spontaneous breathing, nonlabored ventilation and respiratory function stable Cardiovascular status: stable and blood pressure returned to baseline Postop Assessment: no apparent nausea or vomiting Anesthetic complications: no   No notable events documented.                Maeven Mcdougall

## 2023-09-30 NOTE — Plan of Care (Signed)

## 2023-09-30 NOTE — Progress Notes (Signed)
 PHARMACY - ANTICOAGULATION CONSULT NOTE  Pharmacy Consult for heparin  Indication:  ACS   Allergies  Allergen Reactions   Cipro [Ciprofloxacin Hcl] Hives, Itching and Rash   Flagyl [Metronidazole] Itching, Swelling and Rash   Celebrex [Celecoxib] Itching, Swelling and Other (See Comments)    Flushing, skin redness   Keflex  [Cephalexin ] Itching    09/2018 note: tolerated Augmentin   Relafen [Nabumetone] Itching and Swelling    Patient Measurements: Height: 5' (152.4 cm) Weight: 106 kg (233 lb 11 oz) IBW/kg (Calculated) : 45.5 HEPARIN  DW (KG): 71.1  Vital Signs: Temp: 98.6 F (37 C) (09/15 1959) Temp Source: Oral (09/15 1959) BP: 90/66 (09/15 1615) Pulse Rate: 73 (09/15 1100)  Labs: Recent Labs    09/28/23 1641 09/28/23 1647 09/29/23 0154 09/29/23 0205 09/29/23 0205 09/29/23 0457 09/29/23 0832 09/29/23 1207 09/29/23 2048 09/30/23 1011 09/30/23 2015  HGB 9.6*   < > 9.9* 10.5*  --   --   --  9.2*  --  8.3*  --   HCT 31.9*   < > 33.1* 31.0*  --   --   --  30.4*  --  27.3*  --   PLT 384   < > 437*  --   --   --   --  357  --  271  --   LABPROT 16.6*  --   --   --   --   --   --   --   --   --   --   INR 1.3*  --   --   --   --   --   --   --   --   --   --   HEPARINUNFRC  --   --   --   --    < >  --   --  0.18* 0.10* 0.28* 0.36  CREATININE 1.09*   < > 1.23*  --   --   --   --  1.23*  --  1.30*  --   TROPONINIHS 18*   < >  --   --   --  676* 811*  --   --  278*  --    < > = values in this interval not displayed.    Estimated Creatinine Clearance: 40.5 mL/min (A) (by C-G formula based on SCr of 1.3 mg/dL (H)).  Assessment: 76 yoF presented to ED with abdominal pain and became acutely hypoxic now intubated. Pharmacy consulted to dose heparin  for VTE. PMH includes CAD s/p PCI L Circ and DES to distal RCA on DAPT, severe AS, moderate MS (in process for TAVR evaluation), HFpEF. 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  Cta negative  for PE   9/15 PM: Heparin  level therapeutic at 0.36 with heparin  running at 1600 units/hour.   Goal of Therapy:  Heparin  level 0.3-0.7 units/ml Monitor platelets by anticoagulation protocol: Yes   Plan:  Continue heparin  infusion at 1600 units/hr Check anti-Xa level in 8 hours  Continue to monitor H&H and platelets  Massie Fila, PharmD Clinical Pharmacist  09/30/2023 9:04 PM

## 2023-09-30 NOTE — Progress Notes (Signed)
 PHARMACY - ANTICOAGULATION CONSULT NOTE  Pharmacy Consult for heparin  Indication:  ACS   Allergies  Allergen Reactions   Cipro [Ciprofloxacin Hcl] Hives, Itching and Rash   Flagyl [Metronidazole] Itching, Swelling and Rash   Celebrex [Celecoxib] Itching, Swelling and Other (See Comments)    Flushing, skin redness   Keflex  [Cephalexin ] Itching    09/2018 note: tolerated Augmentin   Relafen [Nabumetone] Itching and Swelling    Patient Measurements: Height: 5' (152.4 cm) Weight: 106 kg (233 lb 11 oz) IBW/kg (Calculated) : 45.5 HEPARIN  DW (KG): 71.1  Vital Signs: Temp: 98.6 F (37 C) (09/15 0757) Temp Source: Bladder (09/15 0757) BP: 105/46 (09/15 1100) Pulse Rate: 73 (09/15 1100)  Labs: Recent Labs    09/28/23 1641 09/28/23 1647 09/28/23 2132 09/28/23 2359 09/29/23 0153 09/29/23 0154 09/29/23 0205 09/29/23 0457 09/29/23 0832 09/29/23 1207 09/29/23 2048 09/30/23 1011  HGB 9.6*   < > 12.9   < >  --  9.9* 10.5*  --   --  9.2*  --  8.3*  HCT 31.9*   < > 38.0   < >  --  33.1* 31.0*  --   --  30.4*  --  27.3*  PLT 384   < >  --   --   --  437*  --   --   --  357  --  271  LABPROT 16.6*  --   --   --   --   --   --   --   --   --   --   --   INR 1.3*  --   --   --   --   --   --   --   --   --   --   --   HEPARINUNFRC  --   --   --   --   --   --   --   --   --  0.18* 0.10* 0.28*  CREATININE 1.09*   < > 1.30*  --   --  1.23*  --   --   --  1.23*  --   --   TROPONINIHS 18*   < >  --   --  444*  --   --  676* 811*  --   --   --    < > = values in this interval not displayed.    Estimated Creatinine Clearance: 42.8 mL/min (A) (by C-G formula based on SCr of 1.23 mg/dL (H)).  Assessment: 37 yoF presented to ED with abdominal pain and became acutely hypoxic now intubated. Pharmacy consulted to dose heparin  for VTE. PMH includes CAD s/p PCI L Circ and DES to distal RCA on DAPT, severe AS, moderate MS (in process for TAVR evaluation), HFpEF. History of symptomatic anemia due  to chronic GI bleed (GAVE) admitted on 8/27.  Cta negative for PE   Heparin  level came back slightly subtherapeutic. Plan to increase rate and f/u after cards consult.  Goal of Therapy:  Heparin  level 0.3-0.7 units/ml Monitor platelets by anticoagulation protocol: Yes   Plan:  Increase heparin  infusion to 1600units/hr Check anti-Xa level in 8 hours and daily while on heparin  Continue to monitor H&H and platelets  Sergio Batch, PharmD, BCIDP, AAHIVP, CPP Infectious Disease Pharmacist 09/30/2023 11:20 AM

## 2023-09-30 NOTE — Progress Notes (Signed)
 MRSA PCR is pending. Ok to change vanc to linezolid  for now until resulted per Dr Kara.   Sergio Batch, PharmD, BCIDP, AAHIVP, CPP Infectious Disease Pharmacist 09/30/2023 10:16 AM

## 2023-09-30 NOTE — TOC CM/SW Note (Signed)
 Transition of Care [facility]) - Inpatient Brief Assessment   Patient Details  Name: Gloria Sharp MRN: [id number] Date of Birth: [birth year]  Transition of Care [facility]) CM/SW Contact:    Tom-Johnson, Harvest Muskrat, RN Phone Number: 09/30/2023, 2:52 PM   Clinical Narrative:  Patient presented to ED with Shortness Of Breath, Rt sided pain, Nausea and low grade temp of 99.3. Patient admitted with Pyelonephritis and Acute Hypoxic Respiratory Failure. Patient was intubated, extubated on 09/29/23 and currently on 2L O2. On IV abx abd Heparin  gtt.  Patient recently discharged on 09/13/23 with Acute Blood Loss Anemia and received 2U PRBC.  Patient is from home with her husband [name] who is at bedside. Modified independent, has all necessary DME's at home.  PCP is Trinidad Hun, MD and uses [facility] in [place].  Patient states she is active with [facility] for outpatient Physical Therapy.  Patient not Medically ready for discharge.  CM will continue to follow as patient progresses with care towards discharge.           Transition of Care Asessment: Insurance and Status: Insurance coverage has been reviewed   Home environment has been reviewed: Yes Prior level of function:: Modified Independent Prior/Current Home Services: No current home services Social Drivers of Health Review: SDOH reviewed no interventions necessary Readmission risk has been reviewed: Yes Transition of care needs: transition of care needs identified, TOC will continue to follow

## 2023-10-01 DIAGNOSIS — J9601 Acute respiratory failure with hypoxia: Secondary | ICD-10-CM

## 2023-10-01 LAB — GLUCOSE, CAPILLARY
Glucose-Capillary: 118 mg/dL — ABNORMAL HIGH (ref 70–99)
Glucose-Capillary: 114 mg/dL — ABNORMAL HIGH (ref 70–99)
Glucose-Capillary: 213 mg/dL — ABNORMAL HIGH (ref 70–99)
Glucose-Capillary: 146 mg/dL — ABNORMAL HIGH (ref 70–99)
Glucose-Capillary: 180 mg/dL — ABNORMAL HIGH (ref 70–99)
Glucose-Capillary: 245 mg/dL — ABNORMAL HIGH (ref 70–99)

## 2023-10-01 LAB — HEPARIN LEVEL (UNFRACTIONATED): Heparin Unfractionated: 0.38 [IU]/mL (ref 0.30–0.70)

## 2023-10-01 LAB — CBC
HCT: 26 % — ABNORMAL LOW (ref 36.0–46.0)
Hemoglobin: 8.3 g/dL — ABNORMAL LOW (ref 12.0–15.0)
MCH: 26.3 pg (ref 26.0–34.0)
MCHC: 31.9 g/dL (ref 30.0–36.0)
MCV: 82.5 fL (ref 80.0–100.0)
Platelets: 277 K/uL (ref 150–400)
RBC: 3.15 MIL/uL — ABNORMAL LOW (ref 3.87–5.11)
RDW: 18.3 % — ABNORMAL HIGH (ref 11.5–15.5)
WBC: 11.4 K/uL — ABNORMAL HIGH (ref 4.0–10.5)
nRBC: 0 % (ref 0.0–0.2)

## 2023-10-01 LAB — PHOSPHORUS: Phosphorus: 2.7 mg/dL (ref 2.5–4.6)

## 2023-10-01 LAB — BASIC METABOLIC PANEL WITH GFR
Anion gap: 10 (ref 5–15)
BUN: 12 mg/dL (ref 8–23)
CO2: 23 mmol/L (ref 22–32)
Calcium: 8.3 mg/dL — ABNORMAL LOW (ref 8.9–10.3)
Chloride: 104 mmol/L (ref 98–111)
Creatinine, Ser: 1.03 mg/dL — ABNORMAL HIGH (ref 0.44–1.00)
GFR, Estimated: 56 mL/min — ABNORMAL LOW (ref >60)
Glucose, Bld: 118 mg/dL — ABNORMAL HIGH (ref 70–99)
Potassium: 3.7 mmol/L (ref 3.5–5.1)
Sodium: 137 mmol/L (ref 135–145)

## 2023-10-01 LAB — MAGNESIUM: Magnesium: 1.6 mg/dL — ABNORMAL LOW (ref 1.7–2.4)

## 2023-10-01 MED ORDER — MAGNESIUM SULFATE 2 GM/50ML IV SOLN
2.0000 g | Freq: Once | INTRAVENOUS | Status: AC
  Administered 2023-10-01: 2 g via INTRAVENOUS
  Filled 2023-10-01: qty 50

## 2023-10-01 MED ORDER — SODIUM CHLORIDE 0.9 % IV SOLN
2.0000 g | INTRAVENOUS | Status: AC
  Administered 2023-10-01 – 2023-10-03 (×3): 2 g via INTRAVENOUS
  Filled 2023-10-01 (×3): qty 20

## 2023-10-01 NOTE — Care Management Important Message (Signed)
 Important Message  Patient Details  Name: Gloria Sharp MRN: [id number] Date of Birth: [birth year]   Important Message Given:  Yes - Medicare IM     Claretta Deed 10/01/2023, 4:23 PM

## 2023-10-01 NOTE — Progress Notes (Signed)
 PROGRESS NOTE    Gloria Sharp  FMW:[id number] DOB: [birth year] DOA: 09/28/2023 PCP: Trinidad Hun, MD   Brief Narrative:   78 year old woman who presented to [facility] 9/13 for abdominal pain. PMHx significant for HTN, CAD s/p PCI (LCx, DES to distal RCA on DAPT), moderate-to-severe AS (under consideration for TAVR), CVA, anemia, T2DM, fibromyalgia, admitted with septic shock secondary to acute pyelonephritis, intubated for ARF with hypoxia with subsequent extubation on 9/14, transferred to [facility] on 9/16. She is on antibiotics and clinically improving. Cardiology on board and she is on a heparin  drip. She lives at home with her husband.  Assessment & Plan:  Principal Problem:   Acute hypoxic respiratory failure (HCC)    Septic shock secondary to acute pyelonephritis,POA: -Resolved, off of levophed  now -continue with IV Zosyn . Zyvox  was stopped on 9/15.   Acute pyelonephritis secondary to UTI,POA: Management as above  Acute hypoxic respiratory failure,POA: Resolved now  AKI,POA: likely ATN In the setting of septic shock. Resolved now  CAD with non-sustained VT: h/o PCI to Lcx and DES to distal RCA in June 2025. Continue with DAPT S/p TEE on 9/9 (Normal EF, no PFO)  Severe AS and MR Cardiology on board On heparin  drip She is being worked up for possible TAVR as an outpatient by Dr Wonda.  Type 2 DM,POA: -SSI -Goal glucose levels between 140-180  Normocytic anemia,POA: h/o multiple iron  infusions in the past. 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 Husband is present at the bedside. She told me that she has been worked up for possible TAVR by Dr Wonda. She is awaiting PT evaluation. She does have a walker, cane and rollator at home.   Examination:  General exam: Appears calm and comfortable  Respiratory system: Clear to auscultation. Respiratory effort normal. Cardiovascular system: S1 & S2 heard, RRR. No JVD, murmurs, rubs, gallops or clicks. No pedal edema. Gastrointestinal system: Abdomen is nondistended, soft and nontender. No organomegaly or masses felt. Normal bowel sounds heard. Central nervous system: Alert and oriented. No focal neurological deficits. Extremities: Symmetric 5 x 5 power. Skin: No rashes, lesions or ulcers Psychiatry: Judgement and insight appear normal. Mood & affect appropriate.      Diet Orders (From admission, onward)     Start     Ordered   09/29/23 0917  Diet regular Room service appropriate? Yes; Fluid consistency: Thin  Diet effective now       Question Answer Comment  Room service appropriate? Yes   Fluid consistency: Thin      09/29/23 0916            Objective: Vitals:   09/30/23 2200 09/30/23 2259 10/01/23 0418 10/01/23 0831  BP: 129/63 (!) 151/59 (!) 145/54 (!) 130/54  Pulse: 82 80 85 86  Resp: (!) 24 17 17 20   Temp:  98.5 F (36.9 C) 99.2 F (37.3 C) 98 F (36.7 C)  TempSrc:  Oral Oral Oral  SpO2: 95% 91% 91% 94%  Weight:      Height:  Intake/Output Summary (Last 24 hours) at 10/01/2023 1041 Last data filed at 10/01/2023 1023 Gross per 24 hour  Intake 1380.12 ml  Output 2370 ml  Net -989.88 ml   Filed Weights   09/29/23 0030 09/29/23 0453 09/30/23 0406  Weight: 104.2 kg 104.2 kg 106 kg    Scheduled Meds:  aspirin   81 mg Oral Daily   atorvastatin   80 mg Oral Daily   Chlorhexidine  Gluconate Cloth  6 each Topical Daily   clopidogrel   75 mg Oral Daily   insulin  aspart  0-15 Units Subcutaneous Q4H   lidocaine   1 patch  Transdermal Q24H   multivitamins with iron   1 tablet Oral Daily   pantoprazole   40 mg Oral BID   sucralfate   1 g Oral Q8H   vitamin B-12  500 mcg Oral Daily   Continuous Infusions:  heparin  1,600 Units/hr (10/01/23 9177)   magnesium  sulfate bolus IVPB 2 g (10/01/23 1023)   piperacillin -tazobactam (ZOSYN )  IV 3.375 g (10/01/23 0538)    Nutritional status     Body mass index is 45.64 kg/m.  Data Reviewed:   CBC: Recent Labs  Lab 09/28/23 1641 09/28/23 1647 09/28/23 2113 09/28/23 2132 09/29/23 0154 09/29/23 0205 09/29/23 1207 09/30/23 1011 10/01/23 0528  WBC 18.7*  --  29.1*  --  43.0*  --  31.2* 16.5* 11.4*  NEUTROABS 16.6*  --  25.9*  --   --   --   --   --   --   HGB 9.6*   < > 11.2*   < > 9.9* 10.5* 9.2* 8.3* 8.3*  HCT 31.9*   < > 38.6   < > 33.1* 31.0* 30.4* 27.3* 26.0*  MCV 86.4  --  89.6  --  86.6  --  86.6 85.8 82.5  PLT 384  --  493*  --  437*  --  357 271 277   < > = values in this interval not displayed.   Basic Metabolic Panel: Recent Labs  Lab 09/28/23 2113 09/28/23 2132 09/28/23 2359 09/29/23 0154 09/29/23 0205 09/29/23 1207 09/30/23 1011 10/01/23 0528  NA 138 140   < > 140 140 134* 137 137  K 3.9 3.9   < > 3.8 4.0 3.9 4.0 3.7  CL 106 106  --  107  --  106 105 104  CO2 16*  --   --  20*  --  20* 23 23  GLUCOSE 280* 285*  --  245*  --  199* 154* 118*  BUN 19 23  --  20  --  18 16 12   CREATININE 1.24* 1.30*  --  1.23*  --  1.23* 1.30* 1.03*  CALCIUM  8.2*  --   --  8.3*  --  8.0* 7.9* 8.3*  MG  --   --   --  1.5*  --  2.6* 1.8 1.6*  PHOS  --   --   --  4.2  --   --  3.1 2.7   < > = values in this interval not displayed.   GFR: Estimated Creatinine Clearance: 51.1 mL/min (A) (by C-G formula based on SCr of 1.03 mg/dL (H)). Liver Function Tests: Recent Labs  Lab 09/28/23 1641 09/28/23 2113 09/29/23 0154  AST 18 22 19   ALT 15 13 13   ALKPHOS 77 90 63  BILITOT 0.8 0.9 1.2  PROT 5.9* 6.1* 4.5*  ALBUMIN 3.4* 3.2* 2.8*   No results for  input(s): LIPASE, AMYLASE in the last 168 hours. No  results for input(s): AMMONIA in the last 168 hours. Coagulation Profile: Recent Labs  Lab 09/28/23 1641  INR 1.3*   Cardiac Enzymes: No results for input(s): CKTOTAL, CKMB, CKMBINDEX, TROPONINI in the last 168 hours. BNP (last 3 results) Recent Labs    02/22/23 1630  PROBNP 410   HbA1C: No results for input(s): HGBA1C in the last 72 hours. CBG: Recent Labs  Lab 09/30/23 1606 09/30/23 1958 10/01/23 0100 10/01/23 0414 10/01/23 0827  GLUCAP 168* 151* 118* 114* 213*   Lipid Profile: No results for input(s): CHOL, HDL, LDLCALC, TRIG, CHOLHDL, LDLDIRECT in the last 72 hours. Thyroid  Function Tests: No results for input(s): TSH, T4TOTAL, FREET4, T3FREE, THYROIDAB in the last 72 hours. Anemia Panel: No results for input(s): VITAMINB12, FOLATE, FERRITIN, TIBC, IRON , RETICCTPCT in the last 72 hours. Sepsis Labs: Recent Labs  Lab 09/28/23 1647 09/28/23 1900 09/28/23 2132 09/29/23 0154  LATICACIDVEN 2.4* 2.2* 6.4* 1.7    Recent Results (from the past 240 hours)  Urine Culture     Status: Abnormal   Collection Time: 09/28/23  4:29 PM   Specimen: Urine, Random  Result Value Ref Range Status   Specimen Description URINE, RANDOM  Final   Special Requests   Final    NONE Reflexed from [id number] Performed at [facility] Lab, [address] [place], [place] [address]    Culture >=100,000 COLONIES/mL ESCHERICHIA COLI (A)  Final   Report Status 09/30/2023 FINAL  Final   Organism ID, Bacteria ESCHERICHIA COLI (A)  Final      Susceptibility   Escherichia coli - MIC*    AMPICILLIN >=32 RESISTANT Resistant     CEFAZOLIN  (URINE) Value in next row Sensitive      4 SENSITIVEThis is a modified FDA-approved test that has been validated and its performance characteristics determined by the reporting laboratory.  This laboratory is certified under the Clinical Laboratory Improvement  Amendments CLIA as qualified to perform high complexity clinical laboratory testing.    CEFEPIME  Value in next row Sensitive      4 SENSITIVEThis is a modified FDA-approved test that has been validated and its performance characteristics determined by the reporting laboratory.  This laboratory is certified under the Clinical Laboratory Improvement Amendments CLIA as qualified to perform high complexity clinical laboratory testing.    ERTAPENEM  Value in next row Sensitive      4 SENSITIVEThis is a modified FDA-approved test that has been validated and its performance characteristics determined by the reporting laboratory.  This laboratory is certified under the Clinical Laboratory Improvement Amendments CLIA as qualified to perform high complexity clinical laboratory testing.    CEFTRIAXONE  Value in next row Sensitive      4 SENSITIVEThis is a modified FDA-approved test that has been validated and its performance characteristics determined by the reporting laboratory.  This laboratory is certified under the Clinical Laboratory Improvement Amendments CLIA as qualified to perform high complexity clinical laboratory testing.    CIPROFLOXACIN Value in next row Intermediate      4 SENSITIVEThis is a modified FDA-approved test that has been validated and its performance characteristics determined by the reporting laboratory.  This laboratory is certified under the Clinical Laboratory Improvement Amendments CLIA as qualified to perform high complexity clinical laboratory testing.    GENTAMICIN Value in next row Sensitive      4 SENSITIVEThis is a modified FDA-approved test that has been validated and its performance characteristics determined by the reporting laboratory.  This laboratory is certified under the Clinical  Laboratory Improvement Amendments CLIA as qualified to perform high complexity clinical laboratory testing.    NITROFURANTOIN Value in next row Sensitive      4 SENSITIVEThis is a modified  FDA-approved test that has been validated and its performance characteristics determined by the reporting laboratory.  This laboratory is certified under the Clinical Laboratory Improvement Amendments CLIA as qualified to perform high complexity clinical laboratory testing.    TRIMETH/SULFA Value in next row Sensitive      4 SENSITIVEThis is a modified FDA-approved test that has been validated and its performance characteristics determined by the reporting laboratory.  This laboratory is certified under the Clinical Laboratory Improvement Amendments CLIA as qualified to perform high complexity clinical laboratory testing.    AMPICILLIN/SULBACTAM Value in next row Intermediate      4 SENSITIVEThis is a modified FDA-approved test that has been validated and its performance characteristics determined by the reporting laboratory.  This laboratory is certified under the Clinical Laboratory Improvement Amendments CLIA as qualified to perform high complexity clinical laboratory testing.    PIP/TAZO Value in next row Sensitive ug/mL     <=4 SENSITIVEThis is a modified FDA-approved test that has been validated and its performance characteristics determined by the reporting laboratory.  This laboratory is certified under the Clinical Laboratory Improvement Amendments CLIA as qualified to perform high complexity clinical laboratory testing.    MEROPENEM Value in next row Sensitive      <=4 SENSITIVEThis is a modified FDA-approved test that has been validated and its performance characteristics determined by the reporting laboratory.  This laboratory is certified under the Clinical Laboratory Improvement Amendments CLIA as qualified to perform high complexity clinical laboratory testing.    * >=100,000 COLONIES/mL ESCHERICHIA COLI  Blood culture (routine x 2)     Status: None (Preliminary result)   Collection Time: 09/28/23  4:41 PM   Specimen: BLOOD LEFT ARM  Result Value Ref Range Status   Specimen Description  BLOOD LEFT ARM  Final   Special Requests   Final    BOTTLES DRAWN AEROBIC AND ANAEROBIC Blood Culture adequate volume   Culture   Final    NO GROWTH 3 DAYS Performed at [facility], [address] [place], [place] [address]    Report Status PENDING  Incomplete  Blood culture (routine x 2)     Status: None (Preliminary result)   Collection Time: 09/28/23  4:41 PM   Specimen: BLOOD LEFT HAND  Result Value Ref Range Status   Specimen Description BLOOD LEFT HAND  Final   Special Requests   Final    BOTTLES DRAWN AEROBIC AND ANAEROBIC Blood Culture adequate volume   Culture   Final    NO GROWTH 3 DAYS Performed at [facility], [address] [place], [place] [address]    Report Status PENDING  Incomplete  MRSA Next Gen by PCR, Nasal     Status: None   Collection Time: 09/30/23  8:50 AM   Specimen: Nasal Mucosa; Nasal Swab  Result Value Ref Range Status   MRSA by PCR Next Gen NOT DETECTED NOT DETECTED Final    Comment: (NOTE) The GeneXpert MRSA Assay (FDA approved for NASAL specimens only), is one component of a comprehensive MRSA colonization surveillance program. It is not intended to diagnose MRSA infection nor to guide or monitor treatment for MRSA infections. Test performance is not FDA approved in patients less than 58 years old. Performed at [facility] Lab, [address], [place], [place] [address]          Radiology Studies: VAS US  LOWER EXTREMITY VENOUS (DVT) Result Date: 09/30/2023  Lower Venous DVT Study Patient Name:  CORLISS COGGESHALL Couser  Date of Exam:   09/29/2023 Medical Rec #: [id number]      Accession #:    [id number] Date of Birth: 1945/11/12      Patient Gender: F Patient Age:   84 years Exam Location:  [facility] Procedure:      VAS US  LOWER EXTREMITY VENOUS (DVT) Referring Phys: TORIBIO SHARPS --------------------------------------------------------------------------------  Indications: Edema.  Limitations: Poor ultrasound/tissue interface.  Comparison Study: No previous exams Performing Technologist: Jody Gatson RVT, RDMS  Examination Guidelines: A complete evaluation includes B-mode imaging, spectral Doppler, color Doppler, and power Doppler as needed of all accessible portions of each vessel. Bilateral testing is considered an integral part of a complete examination. Limited examinations for reoccurring indications may be performed as noted. The reflux portion of the exam is performed with the patient in reverse Trendelenburg.  +---------+---------------+---------+-----------+----------+--------------+ RIGHT    CompressibilityPhasicitySpontaneityPropertiesThrombus Aging +---------+---------------+---------+-----------+----------+--------------+ CFV      Full           Yes      Yes                                 +---------+---------------+---------+-----------+----------+--------------+ SFJ      Full                                                        +---------+---------------+---------+-----------+----------+--------------+ FV Prox  Full           Yes      Yes                                 +---------+---------------+---------+-----------+----------+--------------+ FV Mid   Full           Yes      Yes                                 +---------+---------------+---------+-----------+----------+--------------+ FV DistalFull           Yes      Yes                                 +---------+---------------+---------+-----------+----------+--------------+ PFV      Full                                                        +---------+---------------+---------+-----------+----------+--------------+ POP      Full           Yes      Yes                                 +---------+---------------+---------+-----------+----------+--------------+ PTV      Full                                                        +---------+---------------+---------+-----------+----------+--------------+  PERO      Full                                                        +---------+---------------+---------+-----------+----------+--------------+   +---------+---------------+---------+-----------+----------+--------------+ LEFT     CompressibilityPhasicitySpontaneityPropertiesThrombus Aging +---------+---------------+---------+-----------+----------+--------------+ CFV      Full           Yes      Yes                                 +---------+---------------+---------+-----------+----------+--------------+ SFJ      Full                                                        +---------+---------------+---------+-----------+----------+--------------+ FV Prox  Full           Yes      Yes                                 +---------+---------------+---------+-----------+----------+--------------+ FV Mid   Full           Yes      Yes                                 +---------+---------------+---------+-----------+----------+--------------+ FV DistalFull           Yes      Yes                                 +---------+---------------+---------+-----------+----------+--------------+ PFV      Full                                                        +---------+---------------+---------+-----------+----------+--------------+ POP      Full           Yes      Yes                                 +---------+---------------+---------+-----------+----------+--------------+ PTV      Full                                                        +---------+---------------+---------+-----------+----------+--------------+ PERO     Full                                                        +---------+---------------+---------+-----------+----------+--------------+  Summary: BILATERAL: - No evidence of deep vein thrombosis seen in the lower extremities, bilaterally. - RIGHT: - No cystic structure found in the popliteal fossa.  LEFT: - A heterogenous cystic structure  is found in the popliteal fossa (4.08 x 1.02 x 2.40 cm).  *See table(s) above for measurements and observations. Electronically signed by Debby Robertson on 09/30/2023 at 11:33:54 AM.    Final    ECHOCARDIOGRAM LIMITED Result Date: 09/29/2023    ECHOCARDIOGRAM LIMITED REPORT   Patient Name:   MICHELINE MARKES Messler Date of Exam: 09/29/2023 Medical Rec #:  [id number]     Height:       60.0 in Accession #:    [id number]    Weight:       229.7 lb Date of Birth:  Dec 11, 1945     BSA:          1.980 m Patient Age:    78 years      BP:           100/48 mmHg Patient Gender: F             HR:           78 bpm. Exam Location:  Inpatient Procedure: Limited Echo, Cardiac Doppler, Color Doppler and Intracardiac            Opacification Agent (Both Spectral and Color Flow Doppler were            utilized during procedure). Indications:    NSTEMI I21.4  History:        Patient has prior history of Echocardiogram examinations, most                 recent 09/24/2023. Mitral Valve Disease and Aortic Valve Disease;                 Risk Factors:Hypertension and Diabetes.  Sonographer:    Jayson Gaskins Referring Phys: [id number] TORIBIO BROCKS SMITH IMPRESSIONS  1. Left ventricular ejection fraction, by estimation, is 50 to 55%. The left ventricle has low normal function. The left ventricle demonstrates regional wall motion abnormalities. Apical septal hypokinesis. The left ventricular internal cavity size was mildly dilated. There is mild left ventricular hypertrophy.  2. Right ventricular systolic function is normal. The right ventricular size is normal.  3. The aortic valve is calcified. There is severe calcifcation of the aortic valve. Aortic valve regurgitation is not visualized. Moderate to severe aortic valve stenosis, though limited evaluation (gradients only assessed on apical 5C view). Moderate AS by gradients (Vmax 2.9 m/s, MG ) and DI (0.38), severe by AVA (0.9cm^2). FINDINGS  Left Ventricle: Left ventricular ejection fraction, by  estimation, is 50 to 55%. The left ventricle has low normal function. The left ventricle demonstrates regional wall motion abnormalities. Definity  contrast agent was given IV to delineate the left ventricular endocardial borders. The left ventricular internal cavity size was mildly dilated. There is mild left ventricular hypertrophy. Right Ventricle: The right ventricular size is normal. No increase in right ventricular wall thickness. Right ventricular systolic function is normal. Pericardium: There is no evidence of pericardial effusion. Tricuspid Valve: The tricuspid valve is normal in structure. Tricuspid valve regurgitation is mild. Aortic Valve: The aortic valve is calcified. There is severe calcifcation of the aortic valve. Aortic valve regurgitation is not visualized. Severe aortic stenosis is present. Aortic valve mean gradient measures 22.0 mmHg. Aortic valve peak gradient measures 32.5 mmHg. Aortic valve area, by VTI measures 0.86 cm. LEFT VENTRICLE PLAX 2D LVIDd:  5.70 cm LVIDs:         4.20 cm LV PW:         1.00 cm LV IVS:        1.00 cm LVOT diam:     1.70 cm LV SV:         50 LV SV Index:   25 LVOT Area:     2.27 cm  AORTIC VALVE AV Area (Vmax):    0.71 cm AV Area (Vmean):   0.67 cm AV Area (VTI):     0.86 cm AV Vmax:           285.00 cm/s AV Vmean:          218.000 cm/s AV VTI:            0.579 m AV Peak Grad:      32.5 mmHg AV Mean Grad:      22.0 mmHg LVOT Vmax:         88.60 cm/s LVOT Vmean:        64.600 cm/s LVOT VTI:          0.220 m LVOT/AV VTI ratio: 0.38  SHUNTS Systemic VTI:  0.22 m Systemic Diam: 1.70 cm Lonni Nanas MD Electronically signed by Lonni Nanas MD Signature Date/Time: 09/29/2023/1:16:55 PM    Final        LOS: 3 days   Time spent= 41 mins    Deliliah Room, MD [facility]  If 7PM-7AM, please contact night-coverage  10/01/2023, 10:41 AM

## 2023-10-01 NOTE — Evaluation (Addendum)
 Physical Therapy Evaluation Patient Details Name: Gloria Sharp MRN: [id number] DOB: Gloria 17, 1947 Today's Date: 10/01/2023  History of Present Illness  Pt is a 78 y.o. F who was admitted 9/13 with  septic shock secondary to acute pyelonephritis, intubated for ARF with hypoxia with subsequent extubation on 9/14, transferred to [facility] on 9/16. PMH - chronic/rt leg pain, DM2, HTN, CVA, aortic stenosis undergoing evaluation for TAVR, mitral regurgitation, HFrEF, fibromyalgia, IBS, asthma, CAD s/p PCI 06/2023, lumbar radiculopathy, angina pectoris  Clinical Impression  PTA, pt lives with her spouse and uses a cane vs RW indoors, Rollator outdoors for community distances. In light of current medical condition and recent admissions, she is weaker than her baseline. Pt received sitting up in chair, ambulating room distance with RW and CGA. SpO2 93-97% on RA. Presents with decreased cardiopulmonary endurance, impaired standing balance, weakness. Pt has excellent spousal support upon d/c. Pt agreeable to start out with HHPT and then transition to OPPT as she gets stronger.       If plan is discharge home, recommend the following: A little help with walking and/or transfers;A little help with bathing/dressing/bathroom;Assistance with cooking/housework;Assist for transportation;Help with stairs or ramp for entrance   Can travel by private vehicle        Equipment Recommendations None recommended by PT  Recommendations for Other Services       Functional Status Assessment Patient has had a recent decline in their functional status and demonstrates the ability to make significant improvements in function in a reasonable and predictable amount of time.     Precautions / Restrictions Precautions Precautions: Fall Recall of Precautions/Restrictions: Intact Restrictions Weight Bearing Restrictions Per Provider Order: No      Mobility  Bed Mobility               General bed mobility comments: OOB in  chair    Transfers Overall transfer level: Needs assistance Equipment used: Rolling walker (2 wheels) Transfers: Sit to/from Stand Sit to Stand: Supervision                Ambulation/Gait Ambulation/Gait assistance: Contact guard assist Gait Distance (Feet): 35 Feet Assistive device: Rolling walker (2 wheels) Gait Pattern/deviations: Step-through pattern, Decreased stride length, Trunk flexed Gait velocity: decreased Gait velocity interpretation: <1.8 ft/sec, indicate of risk for recurrent falls   General Gait Details: Slow and steady gait with RW, flexed posture. Cues for monitoring for activity tolerance  Stairs            Wheelchair Mobility     Tilt Bed    Modified Rankin (Stroke Patients Only)       Balance Overall balance assessment: Needs assistance Sitting-balance support: Feet supported Sitting balance-Leahy Scale: Good     Standing balance support: Bilateral upper extremity supported Standing balance-Leahy Scale: Poor Standing balance comment: Leaning anteriorly against sink when washing hands                             Pertinent Vitals/Pain Pain Assessment Pain Assessment: Faces Faces Pain Scale: Hurts a little bit Pain Location: chronic back and R leg pain Pain Descriptors / Indicators: Discomfort Pain Intervention(s): Monitored during session    Home Living Family/patient expects to be discharged to:: Private residence Living Arrangements: Spouse/significant other Available Help at Discharge: Family;Available 24 hours/day Type of Home: House Home Access: Ramped entrance       Home Layout: One level Home Equipment: Agricultural consultant (2 wheels);Rollator (4 wheels);Cane -  single point;BSC/3in1;Transport chair;Tub bench      Prior Function Prior Level of Function : Independent/Modified Independent;Driving             Mobility Comments: cane or RW indoors, rollator outdoors ADLs Comments: Mod I using shower chair for  bathing, recently purchased a tub bench, active with her church     Extremity/Trunk Assessment   Upper Extremity Assessment Upper Extremity Assessment: Defer to OT evaluation    Lower Extremity Assessment Lower Extremity Assessment: RLE deficits/detail;LLE deficits/detail RLE Deficits / Details: Increased edema distally. Grossly 4/5 LLE Deficits / Details: Increased edema distally. Grossly 4/5    Cervical / Trunk Assessment Cervical / Trunk Assessment: Kyphotic  Communication   Communication Communication: Impaired Factors Affecting Communication: Hearing impaired    Cognition Arousal: Alert Behavior During Therapy: WFL for tasks assessed/performed   PT - Cognitive impairments: No apparent impairments                         Following commands: Intact       Cueing Cueing Techniques: Verbal cues     General Comments      Exercises     Assessment/Plan    PT Assessment Patient needs continued PT services  PT Problem List Decreased strength;Decreased activity tolerance;Decreased balance;Decreased mobility;Impaired sensation;Pain       PT Treatment Interventions DME instruction;Gait training;Functional mobility training;Therapeutic activities;Therapeutic exercise;Balance training;Patient/family education    PT Goals (Current goals can be found in the Care Plan section)  Acute Rehab PT Goals Patient Stated Goal: to return to baseline PT Goal Formulation: With patient Time For Goal Achievement: 10/15/23 Potential to Achieve Goals: Good    Frequency Min 2X/week     Co-evaluation               AM-PAC PT 6 Clicks Mobility  Outcome Measure Help needed turning from your back to your side while in a flat bed without using bedrails?: None Help needed moving from lying on your back to sitting on the side of a flat bed without using bedrails?: A Little Help needed moving to and from a bed to a chair (including a wheelchair)?: A Little Help needed  standing up from a chair using your arms (e.g., wheelchair or bedside chair)?: A Little Help needed to walk in hospital room?: A Little Help needed climbing 3-5 steps with a railing? : A Lot 6 Click Score: 18    End of Session Equipment Utilized During Treatment: Gait belt Activity Tolerance: Patient tolerated treatment well;Patient limited by pain Patient left: in chair;with call bell/phone within reach;with chair alarm set Nurse Communication: Mobility status PT Visit Diagnosis: Unsteadiness on feet (R26.81);Other abnormalities of gait and mobility (R26.89);Muscle weakness (generalized) (M62.81);Difficulty in walking, not elsewhere classified (R26.2)    Time: 8858-8794 PT Time Calculation (min) (ACUTE ONLY): 24 min   Charges:   PT Evaluation $PT Eval Moderate Complexity: 1 Mod PT Treatments $Therapeutic Activity: 8-22 mins PT General Charges $$ ACUTE PT VISIT: 1 Visit         Aleck Daring, PT, DPT Acute Rehabilitation Services Office [phone]   Aleck ONEIDA Daring 10/01/2023, 1:04 PM

## 2023-10-01 NOTE — Progress Notes (Signed)
 PHARMACY - ANTICOAGULATION CONSULT NOTE  Pharmacy Consult for heparin  Indication:  ACS   Allergies  Allergen Reactions   Cipro [Ciprofloxacin Hcl] Hives, Itching and Rash   Flagyl [Metronidazole] Itching, Swelling and Rash   Celebrex [Celecoxib] Itching, Swelling and Other (See Comments)    Flushing, skin redness   Keflex  [Cephalexin ] Itching    09/2018 note: tolerated Augmentin   Relafen [Nabumetone] Itching and Swelling    Patient Measurements: Height: 5' (152.4 cm) Weight: 106 kg (233 lb 11 oz) IBW/kg (Calculated) : 45.5 HEPARIN  DW (KG): 71.1  Vital Signs: Temp: 99.2 F (37.3 C) (09/16 0418) Temp Source: Oral (09/16 0418) BP: 145/54 (09/16 0418) Pulse Rate: 85 (09/16 0418)  Labs: Recent Labs    09/28/23 1641 09/28/23 1647 09/29/23 0457 09/29/23 0832 09/29/23 1207 09/29/23 2048 09/30/23 1011 09/30/23 2015 10/01/23 0528  HGB 9.6*   < >  --   --  9.2*  --  8.3*  --  8.3*  HCT 31.9*   < >  --   --  30.4*  --  27.3*  --  26.0*  PLT 384   < >  --   --  357  --  271  --  277  LABPROT 16.6*  --   --   --   --   --   --   --   --   INR 1.3*  --   --   --   --   --   --   --   --   HEPARINUNFRC  --   --   --   --  0.18*   < > 0.28* 0.36 0.38  CREATININE 1.09*   < >  --   --  1.23*  --  1.30*  --  1.03*  TROPONINIHS 18*   < > 676* 811*  --   --  278*  --   --    < > = values in this interval not displayed.    Estimated Creatinine Clearance: 51.1 mL/min (A) (by C-G formula based on SCr of 1.03 mg/dL (H)).  Assessment: 7 yoF presented to ED with abdominal pain and became acutely hypoxic now intubated. Pharmacy consulted to dose heparin  for VTE. PMH includes CAD s/p PCI L Circ and DES to distal RCA on DAPT, severe AS, moderate MS (in process for TAVR evaluation), HFpEF. 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  Cta negative for PE   9/16 Am update: HL 0.38 Hgb 8.3 PLT wnl No bleeding noted. Had large BM, no blood noted. She did  have a small amount of blood stain on her pad. Nursing w/ continue to monitor throughout the day.   Goal of Therapy:  Heparin  level 0.3-0.7 units/ml Monitor platelets by anticoagulation protocol: Yes   Plan:  Continue heparin  infusion at 1600 units/hr Check anti-Xa level + CBC daily Continue to monitor signs of bleeding / pauses w/ gtt  Benedetta Heath BS, PharmD, BCPS Clinical Pharmacist 10/01/2023 7:04 AM  Contact: [phone] after 3 PM

## 2023-10-02 ENCOUNTER — Encounter (HOSPITAL_COMMUNITY)

## 2023-10-02 ENCOUNTER — Inpatient Hospital Stay (HOSPITAL_COMMUNITY): Admit: 2023-10-02

## 2023-10-02 LAB — GLUCOSE, CAPILLARY
Glucose-Capillary: 125 mg/dL — ABNORMAL HIGH (ref 70–99)
Glucose-Capillary: 133 mg/dL — ABNORMAL HIGH (ref 70–99)
Glucose-Capillary: 166 mg/dL — ABNORMAL HIGH (ref 70–99)
Glucose-Capillary: 167 mg/dL — ABNORMAL HIGH (ref 70–99)
Glucose-Capillary: 172 mg/dL — ABNORMAL HIGH (ref 70–99)
Glucose-Capillary: 178 mg/dL — ABNORMAL HIGH (ref 70–99)
Glucose-Capillary: 198 mg/dL — ABNORMAL HIGH (ref 70–99)

## 2023-10-02 LAB — BASIC METABOLIC PANEL WITH GFR
Anion gap: 11 (ref 5–15)
BUN: 11 mg/dL (ref 8–23)
CO2: 24 mmol/L (ref 22–32)
Calcium: 8.4 mg/dL — ABNORMAL LOW (ref 8.9–10.3)
Chloride: 103 mmol/L (ref 98–111)
Creatinine, Ser: 0.92 mg/dL (ref 0.44–1.00)
GFR, Estimated: >60 mL/min (ref >60)
Glucose, Bld: 130 mg/dL — ABNORMAL HIGH (ref 70–99)
Potassium: 3.6 mmol/L (ref 3.5–5.1)
Sodium: 138 mmol/L (ref 135–145)

## 2023-10-02 LAB — CBC
HCT: 25.4 % — ABNORMAL LOW (ref 36.0–46.0)
Hemoglobin: 7.9 g/dL — ABNORMAL LOW (ref 12.0–15.0)
MCH: 25.8 pg — ABNORMAL LOW (ref 26.0–34.0)
MCHC: 31.1 g/dL (ref 30.0–36.0)
MCV: 83 fL (ref 80.0–100.0)
Platelets: 295 K/uL (ref 150–400)
RBC: 3.06 MIL/uL — ABNORMAL LOW (ref 3.87–5.11)
RDW: 18.6 % — ABNORMAL HIGH (ref 11.5–15.5)
WBC: 9.7 K/uL (ref 4.0–10.5)
nRBC: 0 % (ref 0.0–0.2)

## 2023-10-02 LAB — PHOSPHORUS: Phosphorus: 2.7 mg/dL (ref 2.5–4.6)

## 2023-10-02 LAB — HEPARIN LEVEL (UNFRACTIONATED): Heparin Unfractionated: 0.41 [IU]/mL (ref 0.30–0.70)

## 2023-10-02 LAB — MAGNESIUM: Magnesium: 1.7 mg/dL (ref 1.7–2.4)

## 2023-10-02 MED ORDER — INSULIN ASPART 100 UNIT/ML IJ SOLN
0.0000 [IU] | Freq: Three times a day (TID) | INTRAMUSCULAR | Status: DC
  Administered 2023-10-02 – 2023-10-03 (×3): 3 [IU] via SUBCUTANEOUS

## 2023-10-02 NOTE — Evaluation (Signed)
 Occupational Therapy Evaluation Patient Details Name: Gloria Sharp MRN: [id number] DOB: [birth year] Today's Date: 10/02/2023   History of Present Illness   Pt is a 78 y.o. F who was admitted 9/13 with  septic shock secondary to acute pyelonephritis, intubated for ARF with hypoxia with subsequent extubation on 9/14, transferred to [facility] on 9/16. PMH - chronic/rt leg pain, DM2, HTN, CVA, aortic stenosis undergoing evaluation for TAVR, mitral regurgitation, HFrEF, fibromyalgia, IBS, asthma, CAD s/p PCI 06/2023, lumbar radiculopathy, angina pectoris     Clinical Impressions PTA patient independent and driving. Admitted for above and presents with problem list below, including decreased activity tolerance, impaired balance and generalized weakness. Currently requires contact guard for transfers and mobility using RW in room, up to min assist for Adls. Pt has good support from spouse at dc.  Based on performance today, pt will best benefit from continued OT services acutely and after dc at [facility] level to optimize independence and safety with ADLs, IADLs and mobility.     If plan is discharge home, recommend the following:   Assistance with cooking/housework;A little help with walking and/or transfers;A little help with bathing/dressing/bathroom;Assist for transportation;Help with stairs or ramp for entrance     Functional Status Assessment   Patient has had a recent decline in their functional status and demonstrates the ability to make significant improvements in function in a reasonable and predictable amount of time.     Equipment Recommendations   None recommended by OT     Recommendations for Other Services         Precautions/Restrictions   Precautions Precautions: Fall Recall of Precautions/Restrictions: Intact Restrictions Weight Bearing Restrictions Per Provider Order: No     Mobility Bed Mobility               General bed mobility comments: OOB in chair     Transfers Overall transfer level: Needs assistance Equipment used: Rolling walker (2 wheels) Transfers: Sit to/from Stand Sit to Stand: Contact guard assist           General transfer comment: cueing for hand placement, contact guard for safety      Balance Overall balance assessment: Needs assistance Sitting-balance support: Feet supported Sitting balance-Leahy Scale: Good     Standing balance support: Bilateral upper extremity supported, No upper extremity supported, During functional activity Standing balance-Leahy Scale: Poor Standing balance comment: relies on RW dynamically, 0-1 UE support during ADls                           ADL either performed or assessed with clinical judgement   ADL Overall ADL's : Needs assistance/impaired     Grooming: Contact guard assist;Wash/dry hands;Oral care;Sitting;Standing           Upper Body Dressing : Set up;Sitting   Lower Body Dressing: Minimal assistance;Sit to/from stand   Toilet Transfer: Contact guard assist;Ambulation;Rolling walker (2 wheels)           Functional mobility during ADLs: Contact guard assist;Rolling walker (2 wheels)       Vision         Perception         Praxis         Pertinent Vitals/Pain Pain Assessment Pain Assessment: Faces Faces Pain Scale: Hurts a little bit Pain Location: chronic back and R leg pain Pain Descriptors / Indicators: Discomfort Pain Intervention(s): Limited activity within patient's tolerance, Monitored during session, Repositioned     Extremity/Trunk Assessment  Upper Extremity Assessment Upper Extremity Assessment: Generalized weakness   Lower Extremity Assessment Lower Extremity Assessment: Defer to PT evaluation   Cervical / Trunk Assessment Cervical / Trunk Assessment: Kyphotic   Communication Communication Communication: Impaired Factors Affecting Communication: Hearing impaired   Cognition Arousal: Alert Behavior During  Therapy: WFL for tasks assessed/performed Cognition: No apparent impairments                               Following commands: Intact       Cueing  General Comments   Cueing Techniques: Verbal cues      Exercises     Shoulder Instructions      Home Living Family/patient expects to be discharged to:: Private residence Living Arrangements: Spouse/significant other;Other (Comment) (grandson) Available Help at Discharge: Family;Available 24 hours/day Type of Home: House Home Access: Ramped entrance     Home Layout: One level     Bathroom Shower/Tub: Chief Strategy Officer: Handicapped height     Home Equipment: Agricultural consultant (2 wheels);Rollator (4 wheels);Cane - single point;BSC/3in1;Transport chair;Tub bench;Grab bars - tub/shower;Adaptive equipment Adaptive Equipment: Reacher;Sock aid        Prior Functioning/Environment Prior Level of Function : Independent/Modified Independent;Driving             Mobility Comments: cane or RW indoors, rollator outdoors ADLs Comments: Mod I using shower chair for bathing, recently purchased a tub bench, active with her church    OT Problem List: Decreased strength;Decreased activity tolerance;Impaired balance (sitting and/or standing);Pain;Obesity;Decreased knowledge of precautions;Decreased knowledge of use of DME or AE   OT Treatment/Interventions:        OT Goals(Current goals can be found in the care plan section)   Acute Rehab OT Goals Patient Stated Goal: home OT Goal Formulation: With patient Time For Goal Achievement: 10/16/23 Potential to Achieve Goals: Good   OT Frequency:       Co-evaluation              AM-PAC OT 6 Clicks Daily Activity     Outcome Measure Help from another person eating meals?: None Help from another person taking care of personal grooming?: A Little Help from another person toileting, which includes using toliet, bedpan, or urinal?: A Little Help  from another person bathing (including washing, rinsing, drying)?: A Little Help from another person to put on and taking off regular upper body clothing?: A Little Help from another person to put on and taking off regular lower body clothing?: A Little 6 Click Score: 19   End of Session Equipment Utilized During Treatment: Rolling walker (2 wheels) Nurse Communication: Mobility status  Activity Tolerance: Patient tolerated treatment well Patient left: in chair;with call bell/phone within reach  OT Visit Diagnosis: Unsteadiness on feet (R26.81);Pain Pain - Right/Left: Right Pain - part of body: Leg (back)                Time: 9143-9079 OT Time Calculation (min): 24 min Charges:  OT General Charges $OT Visit: 1 Visit OT Evaluation $OT Eval Moderate Complexity: 1 Mod OT Treatments $Self Care/Home Management : 8-22 mins  Etta NOVAK, OT Acute Rehabilitation Services Office [phone] Secure Chat Preferred    Etta GORMAN Hope 10/02/2023, 11:24 AM

## 2023-10-02 NOTE — Progress Notes (Signed)
 Physical Therapy Treatment Patient Details Name: Gloria Sharp MRN: [id number] DOB: 1945-01-18 Today's Date: 10/02/2023   History of Present Illness Pt is a 78 y.o. F who was admitted 9/13 with  septic shock secondary to acute pyelonephritis, intubated for ARF with hypoxia with subsequent extubation on 9/14, transferred to [facility] on 9/16. PMH - chronic/rt leg pain, DM2, HTN, CVA, aortic stenosis undergoing evaluation for TAVR, mitral regurgitation, HFrEF, fibromyalgia, IBS, asthma, CAD s/p PCI 06/2023, lumbar radiculopathy, angina pectoris    PT Comments  Making steady progress with mobility. Eager to return home with husband. Recommend HHPT at dc.     If plan is discharge home, recommend the following: A little help with walking and/or transfers;A little help with bathing/dressing/bathroom;Assistance with cooking/housework;Assist for transportation;Help with stairs or ramp for entrance   Can travel by private vehicle        Equipment Recommendations  None recommended by PT    Recommendations for Other Services       Precautions / Restrictions Precautions Precautions: Fall Recall of Precautions/Restrictions: Intact Restrictions Weight Bearing Restrictions Per Provider Order: No     Mobility  Bed Mobility               General bed mobility comments: OOB in chair    Transfers Overall transfer level: Needs assistance Equipment used: Rollator (4 wheels) Transfers: Sit to/from Stand Sit to Stand: Contact guard assist           General transfer comment: Assist for safety    Ambulation/Gait Ambulation/Gait assistance: Contact guard assist Gait Distance (Feet): 100 Feet (100' x 1, 50' x 1) Assistive device: Rollator (4 wheels) Gait Pattern/deviations: Step-through pattern, Decreased stride length, Trunk flexed Gait velocity: decr Gait velocity interpretation: <1.31 ft/sec, indicative of household ambulator   General Gait Details: Assist for safety. Verbal cues to  stay closer to rollator and stand more erect. Returns to flexed posture.   Stairs             Wheelchair Mobility     Tilt Bed    Modified Rankin (Stroke Patients Only)       Balance Overall balance assessment: Needs assistance Sitting-balance support: Feet supported Sitting balance-Leahy Scale: Good     Standing balance support: Bilateral upper extremity supported, No upper extremity supported, During functional activity Standing balance-Leahy Scale: Poor Standing balance comment: UE support                            Communication Communication Communication: Impaired Factors Affecting Communication: Hearing impaired  Cognition Arousal: Alert Behavior During Therapy: WFL for tasks assessed/performed   PT - Cognitive impairments: No apparent impairments                         Following commands: Intact      Cueing Cueing Techniques: Verbal cues  Exercises      General Comments        Pertinent Vitals/Pain Pain Assessment Pain Assessment: Faces Faces Pain Scale: Hurts a little bit Pain Location: chronic back and R leg pain Pain Descriptors / Indicators: Discomfort Pain Intervention(s): Limited activity within patient's tolerance, Monitored during session    Home Living                          Prior Function            PT Goals (current goals can  now be found in the care plan section) Progress towards PT goals: Progressing toward goals    Frequency    Min 2X/week      PT Plan      Co-evaluation              AM-PAC PT 6 Clicks Mobility   Outcome Measure  Help needed turning from your back to your side while in a flat bed without using bedrails?: None Help needed moving from lying on your back to sitting on the side of a flat bed without using bedrails?: A Little Help needed moving to and from a bed to a chair (including a wheelchair)?: A Little Help needed standing up from a chair using  your arms (e.g., wheelchair or bedside chair)?: A Little Help needed to walk in hospital room?: A Little Help needed climbing 3-5 steps with a railing? : A Lot 6 Click Score: 18    End of Session Equipment Utilized During Treatment: Gait belt Activity Tolerance: Patient tolerated treatment well Patient left: in chair;with call bell/phone within reach   PT Visit Diagnosis: Unsteadiness on feet (R26.81);Other abnormalities of gait and mobility (R26.89);Muscle weakness (generalized) (M62.81);Difficulty in walking, not elsewhere classified (R26.2) Pain - Right/Left: Right Pain - part of body:  (back)     Time: 8474-8454 PT Time Calculation (min) (ACUTE ONLY): 20 min  Charges:    $Gait Training: 8-22 mins PT General Charges $$ ACUTE PT VISIT: 1 Visit                     [facility] PT Acute Rehabilitation Services Office [phone]    Rodgers ORN [facility] 10/02/2023, 3:56 PM

## 2023-10-02 NOTE — Progress Notes (Signed)
 PHARMACY - ANTICOAGULATION CONSULT NOTE  Pharmacy Consult for heparin  Indication:  ACS   Allergies  Allergen Reactions   Cipro [Ciprofloxacin Hcl] Hives, Itching and Rash   Flagyl [Metronidazole] Itching, Swelling and Rash   Celebrex [Celecoxib] Itching, Swelling and Other (See Comments)    Flushing, skin redness   Keflex  [Cephalexin ] Itching    09/2018 note: tolerated Augmentin   Relafen [Nabumetone] Itching and Swelling    Patient Measurements: Height: 5' (152.4 cm) Weight: 106 kg (233 lb 11 oz) IBW/kg (Calculated) : 45.5 HEPARIN  DW (KG): 71.1  Vital Signs: Temp: 98.1 F (36.7 C) (09/17 0310) Temp Source: Oral (09/17 0310) BP: 136/56 (09/17 0310) Pulse Rate: 73 (09/17 0310)  Labs: Recent Labs    09/29/23 0832 09/29/23 1207 09/30/23 1011 09/30/23 2015 10/01/23 0528 10/02/23 0207  HGB  --    < > 8.3*  --  8.3* 7.9*  HCT  --    < > 27.3*  --  26.0* 25.4*  PLT  --    < > 271  --  277 295  HEPARINUNFRC  --    < > 0.28* 0.36 0.38 0.41  CREATININE  --    < > 1.30*  --  1.03* 0.92  TROPONINIHS 811*  --  278*  --   --   --    < > = values in this interval not displayed.    Estimated Creatinine Clearance: 57.2 mL/min (by C-G formula based on SCr of 0.92 mg/dL).  Assessment: 55 yoF presented to ED with abdominal pain and became acutely hypoxic now intubated. Pharmacy consulted to dose heparin  for VTE. PMH includes CAD s/p PCI L Circ and DES to distal RCA on DAPT, severe AS, moderate MS (in process for TAVR evaluation), HFpEF. 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  Cta negative for PE   9/17 AM update: HL 0.41 Hgb 7.9 PLT wnl No bleeding noted. No pauses w/ gtt noted   Goal of Therapy:  Heparin  level 0.3-0.7 units/ml Monitor platelets by anticoagulation protocol: Yes   Plan:  Continue heparin  infusion at 1600 units/hr Check anti-Xa level + CBC daily Continue to monitor signs of bleeding / pauses w/ gtt  Benedetta Heath BS,  PharmD, BCPS Clinical Pharmacist 10/02/2023 6:02 AM  Contact: [phone] after 3 PM

## 2023-10-02 NOTE — Progress Notes (Addendum)
 PROGRESS NOTE    Gloria Sharp  FMW:[id number] DOB: May 12, 1945 DOA: 09/28/2023 PCP: Trinidad Hun, MD   Brief Narrative:   78 year old woman who presented to [facility] 9/13 for abdominal pain. PMHx significant for HTN, CAD s/p PCI (LCx, DES to distal RCA on DAPT), moderate-to-severe AS (under consideration for TAVR), CVA, anemia, T2DM, fibromyalgia, admitted with septic shock secondary to acute pyelonephritis, intubated for ARF with hypoxia with subsequent extubation on 9/14, transferred to [facility] on 9/16. She is on antibiotics and clinically improving. Cardiology on board and she is on a heparin  drip, dced on 9/17. She lives at home with her husband.  Assessment & Plan:  Principal Problem:   Acute hypoxic respiratory failure (HCC)    Septic shock secondary to acute pyelonephritis,POA: -Resolved, off of levophed  now -continue with IV ceftriaxone . Zosyn  was stopped on 9/16. Zyvox  was stopped on 9/15. -No growth on the blood cultures -Urine culture grew E.coli   Acute pyelonephritis secondary to E.coli UTI,POA: Management as above  Acute hypoxic respiratory failure,POA: Resolved now  AKI,POA: likely ATN In the setting of septic shock. Resolved now  CAD with non-sustained VT: h/o PCI to Lcx and DES to distal RCA in June 2025. Continue with DAPT S/p TEE on 9/9 (Normal EF, no PFO)  Severe AS and MR Cardiology on board. Dced heparin  drip on 9/17. Discussed with Dr Ladona on 9/17. She is being worked up for possible TAVR as an outpatient by Dr Wonda.  Type 2 DM,POA: -SSI -Goal glucose levels between 140-180  Normocytic anemia,POA: h/o multiple iron  infusions in the past. 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 Dced heparin  drip today.     Code  Status: Full Code Family Communication:  Husband is at the bedside Status is: Inpatient Remains inpatient appropriate because: sepsis, UTI   Subjective:  She feels better. She did walk with PT today. Husband is present at the bedside. We talked about removal of foley today. She remained on heparin  drip. Denies chest pain or shortness of breath.   Examination:  General exam: Appears calm and comfortable  Respiratory system: Clear to auscultation. Respiratory effort normal. Cardiovascular system: S1 & S2 heard, RRR. No JVD, murmurs, rubs, gallops or clicks. No pedal edema. Gastrointestinal system: Abdomen is nondistended, soft and nontender. No organomegaly or masses felt. Normal bowel sounds heard. Central nervous system: Alert and oriented. No focal neurological deficits. Extremities: Symmetric 5 x 5 power. Skin: No rashes, lesions or ulcers Psychiatry: Judgement and insight appear normal. Mood & affect appropriate.      Diet Orders (From admission, onward)     Start     Ordered   09/29/23 0917  Diet regular Room service appropriate? Yes; Fluid consistency: Thin  Diet effective now       Question Answer Comment  Room service appropriate? Yes   Fluid consistency: Thin      09/29/23 0916            Objective: Vitals:   10/01/23 2004 10/02/23 0015 10/02/23 0310 10/02/23 0749  BP: (!) 128/48 (!) 141/67 (!) 136/56 (!) 165/57  Pulse: 65 74 73 80  Resp: 18 18 18 18   Temp: 97.8 F (36.6 C) 97.9 F (36.6 C) 98.1 F (36.7 C) 98.3 F (36.8  C)  TempSrc:  Oral Oral Oral  SpO2: 97% 94%  95%  Weight:      Height:        Intake/Output Summary (Last 24 hours) at 10/02/2023 1100 Last data filed at 10/02/2023 0830 Gross per 24 hour  Intake 1286.38 ml  Output 1150 ml  Net 136.38 ml   Filed Weights   09/29/23 0030 09/29/23 0453 09/30/23 0406  Weight: 104.2 kg 104.2 kg 106 kg    Scheduled Meds:  aspirin   81 mg Oral Daily   atorvastatin   80 mg Oral Daily    Chlorhexidine  Gluconate Cloth  6 each Topical Daily   clopidogrel   75 mg Oral Daily   insulin  aspart  0-15 Units Subcutaneous Q4H   lidocaine   1 patch Transdermal Q24H   multivitamins with iron   1 tablet Oral Daily   pantoprazole   40 mg Oral BID   sucralfate   1 g Oral Q8H   vitamin B-12  500 mcg Oral Daily   Continuous Infusions:  cefTRIAXone  (ROCEPHIN )  IV 2 g (10/01/23 1522)   heparin  1,600 Units/hr (10/02/23 0031)    Nutritional status     Body mass index is 45.64 kg/m.  Data Reviewed:   CBC: Recent Labs  Lab 09/28/23 1641 09/28/23 1647 09/28/23 2113 09/28/23 2132 09/29/23 0154 09/29/23 0205 09/29/23 1207 09/30/23 1011 10/01/23 0528 10/02/23 0207  WBC 18.7*  --  29.1*  --  43.0*  --  31.2* 16.5* 11.4* 9.7  NEUTROABS 16.6*  --  25.9*  --   --   --   --   --   --   --   HGB 9.6*   < > 11.2*   < > 9.9* 10.5* 9.2* 8.3* 8.3* 7.9*  HCT 31.9*   < > 38.6   < > 33.1* 31.0* 30.4* 27.3* 26.0* 25.4*  MCV 86.4  --  89.6  --  86.6  --  86.6 85.8 82.5 83.0  PLT 384  --  493*  --  437*  --  357 271 277 295   < > = values in this interval not displayed.   Basic Metabolic Panel: Recent Labs  Lab 09/29/23 0154 09/29/23 0205 09/29/23 1207 09/30/23 1011 10/01/23 0528 10/02/23 0207  NA 140 140 134* 137 137 138  K 3.8 4.0 3.9 4.0 3.7 3.6  CL 107  --  106 105 104 103  CO2 20*  --  20* 23 23 24   GLUCOSE 245*  --  199* 154* 118* 130*  BUN 20  --  18 16 12 11   CREATININE 1.23*  --  1.23* 1.30* 1.03* 0.92  CALCIUM  8.3*  --  8.0* 7.9* 8.3* 8.4*  MG 1.5*  --  2.6* 1.8 1.6* 1.7  PHOS 4.2  --   --  3.1 2.7 2.7   GFR: Estimated Creatinine Clearance: 57.2 mL/min (by C-G formula based on SCr of 0.92 mg/dL). Liver Function Tests: Recent Labs  Lab 09/28/23 1641 09/28/23 2113 09/29/23 0154  AST 18 22 19   ALT 15 13 13   ALKPHOS 77 90 63  BILITOT 0.8 0.9 1.2  PROT 5.9* 6.1* 4.5*  ALBUMIN 3.4* 3.2* 2.8*   No results for input(s): LIPASE, AMYLASE in the last 168 hours. No  results for input(s): AMMONIA in the last 168 hours. Coagulation Profile: Recent Labs  Lab 09/28/23 1641  INR 1.3*   Cardiac Enzymes: No results for input(s): CKTOTAL, CKMB, CKMBINDEX, TROPONINI in the last 168 hours. BNP (last 3 results) Recent Labs  02/22/23 1630  PROBNP 410   HbA1C: No results for input(s): HGBA1C in the last 72 hours. CBG: Recent Labs  Lab 10/01/23 1604 10/01/23 2003 10/02/23 0013 10/02/23 0309 10/02/23 0746  GLUCAP 180* 245* 166* 125* 133*   Lipid Profile: No results for input(s): CHOL, HDL, LDLCALC, TRIG, CHOLHDL, LDLDIRECT in the last 72 hours. Thyroid  Function Tests: No results for input(s): TSH, T4TOTAL, FREET4, T3FREE, THYROIDAB in the last 72 hours. Anemia Panel: No results for input(s): VITAMINB12, FOLATE, FERRITIN, TIBC, IRON , RETICCTPCT in the last 72 hours. Sepsis Labs: Recent Labs  Lab 09/28/23 1647 09/28/23 1900 09/28/23 2132 09/29/23 0154  LATICACIDVEN 2.4* 2.2* 6.4* 1.7    Recent Results (from the past 240 hours)  Urine Culture     Status: Abnormal   Collection Time: 09/28/23  4:29 PM   Specimen: Urine, Random  Result Value Ref Range Status   Specimen Description URINE, RANDOM  Final   Special Requests   Final    NONE Reflexed from [phone] Performed at [facility], [address] [place], [place] [address]    Culture >=100,000 COLONIES/mL ESCHERICHIA COLI (A)  Final   Report Status 09/30/2023 FINAL  Final   Organism ID, Bacteria ESCHERICHIA COLI (A)  Final      Susceptibility   Escherichia coli - MIC*    AMPICILLIN >=32 RESISTANT Resistant     CEFAZOLIN  (URINE) Value in next row Sensitive      4 SENSITIVEThis is a modified FDA-approved test that has been validated and its performance characteristics determined by the reporting laboratory.  This laboratory is certified under the Clinical Laboratory Improvement Amendments CLIA as qualified to perform high complexity  clinical laboratory testing.    CEFEPIME  Value in next row Sensitive      4 SENSITIVEThis is a modified FDA-approved test that has been validated and its performance characteristics determined by the reporting laboratory.  This laboratory is certified under the Clinical Laboratory Improvement Amendments CLIA as qualified to perform high complexity clinical laboratory testing.    ERTAPENEM  Value in next row Sensitive      4 SENSITIVEThis is a modified FDA-approved test that has been validated and its performance characteristics determined by the reporting laboratory.  This laboratory is certified under the Clinical Laboratory Improvement Amendments CLIA as qualified to perform high complexity clinical laboratory testing.    CEFTRIAXONE  Value in next row Sensitive      4 SENSITIVEThis is a modified FDA-approved test that has been validated and its performance characteristics determined by the reporting laboratory.  This laboratory is certified under the Clinical Laboratory Improvement Amendments CLIA as qualified to perform high complexity clinical laboratory testing.    CIPROFLOXACIN Value in next row Intermediate      4 SENSITIVEThis is a modified FDA-approved test that has been validated and its performance characteristics determined by the reporting laboratory.  This laboratory is certified under the Clinical Laboratory Improvement Amendments CLIA as qualified to perform high complexity clinical laboratory testing.    GENTAMICIN Value in next row Sensitive      4 SENSITIVEThis is a modified FDA-approved test that has been validated and its performance characteristics determined by the reporting laboratory.  This laboratory is certified under the Clinical Laboratory Improvement Amendments CLIA as qualified to perform high complexity clinical laboratory testing.    NITROFURANTOIN Value in next row Sensitive      4 SENSITIVEThis is a modified FDA-approved test that has been validated and its performance  characteristics determined by the  reporting laboratory.  This laboratory is certified under the Clinical Laboratory Improvement Amendments CLIA as qualified to perform high complexity clinical laboratory testing.    TRIMETH/SULFA Value in next row Sensitive      4 SENSITIVEThis is a modified FDA-approved test that has been validated and its performance characteristics determined by the reporting laboratory.  This laboratory is certified under the Clinical Laboratory Improvement Amendments CLIA as qualified to perform high complexity clinical laboratory testing.    AMPICILLIN/SULBACTAM Value in next row Intermediate      4 SENSITIVEThis is a modified FDA-approved test that has been validated and its performance characteristics determined by the reporting laboratory.  This laboratory is certified under the Clinical Laboratory Improvement Amendments CLIA as qualified to perform high complexity clinical laboratory testing.    PIP/TAZO Value in next row Sensitive ug/mL     <=4 SENSITIVEThis is a modified FDA-approved test that has been validated and its performance characteristics determined by the reporting laboratory.  This laboratory is certified under the Clinical Laboratory Improvement Amendments CLIA as qualified to perform high complexity clinical laboratory testing.    MEROPENEM Value in next row Sensitive      <=4 SENSITIVEThis is a modified FDA-approved test that has been validated and its performance characteristics determined by the reporting laboratory.  This laboratory is certified under the Clinical Laboratory Improvement Amendments CLIA as qualified to perform high complexity clinical laboratory testing.    * >=100,000 COLONIES/mL ESCHERICHIA COLI  Blood culture (routine x 2)     Status: None (Preliminary result)   Collection Time: 09/28/23  4:41 PM   Specimen: BLOOD LEFT ARM  Result Value Ref Range Status   Specimen Description BLOOD LEFT ARM  Final   Special Requests   Final    BOTTLES  DRAWN AEROBIC AND ANAEROBIC Blood Culture adequate volume   Culture   Final    NO GROWTH 4 DAYS Performed at [facility], [address], [place], [place] [address]    Report Status PENDING  Incomplete  Blood culture (routine x 2)     Status: None (Preliminary result)   Collection Time: 09/28/23  4:41 PM   Specimen: BLOOD LEFT HAND  Result Value Ref Range Status   Specimen Description BLOOD LEFT HAND  Final   Special Requests   Final    BOTTLES DRAWN AEROBIC AND ANAEROBIC Blood Culture adequate volume   Culture   Final    NO GROWTH 4 DAYS Performed at [facility], [address] [place], [place] [address]    Report Status PENDING  Incomplete  MRSA Next Gen by PCR, Nasal     Status: None   Collection Time: 09/30/23  8:50 AM   Specimen: Nasal Mucosa; Nasal Swab  Result Value Ref Range Status   MRSA by PCR Next Gen NOT DETECTED NOT DETECTED Final    Comment: (NOTE) The GeneXpert MRSA Assay (FDA approved for NASAL specimens only), is one component of a comprehensive MRSA colonization surveillance program. It is not intended to diagnose MRSA infection nor to guide or monitor treatment for MRSA infections. Test performance is not FDA approved in patients less than 61 years old. Performed at [facility], [address] [place], [place] [address]          Radiology Studies: No results found.      LOS: 4 days   Time spent= 41 mins    Deliliah Room, MD [facility]  If 7PM-7AM, please contact night-coverage  10/02/2023, 11:00 AM

## 2023-10-03 ENCOUNTER — Other Ambulatory Visit (HOSPITAL_COMMUNITY): Payer: Self-pay

## 2023-10-03 ENCOUNTER — Encounter (HOSPITAL_COMMUNITY): Payer: Self-pay | Admitting: Internal Medicine

## 2023-10-03 LAB — BASIC METABOLIC PANEL WITH GFR
Anion gap: 16 — ABNORMAL HIGH (ref 5–15)
BUN: 8 mg/dL (ref 8–23)
CO2: 23 mmol/L (ref 22–32)
Calcium: 8.9 mg/dL (ref 8.9–10.3)
Chloride: 102 mmol/L (ref 98–111)
Creatinine, Ser: 0.83 mg/dL (ref 0.44–1.00)
GFR, Estimated: >60 mL/min (ref >60)
Glucose, Bld: 113 mg/dL — ABNORMAL HIGH (ref 70–99)
Potassium: 3.8 mmol/L (ref 3.5–5.1)
Sodium: 141 mmol/L (ref 135–145)

## 2023-10-03 LAB — CULTURE, BLOOD (ROUTINE X 2)
Culture: NO GROWTH
Culture: NO GROWTH
Special Requests: ADEQUATE
Special Requests: ADEQUATE

## 2023-10-03 LAB — CBC
HCT: 26.9 % — ABNORMAL LOW (ref 36.0–46.0)
Hemoglobin: 8.2 g/dL — ABNORMAL LOW (ref 12.0–15.0)
MCH: 25.8 pg — ABNORMAL LOW (ref 26.0–34.0)
MCHC: 30.5 g/dL (ref 30.0–36.0)
MCV: 84.6 fL (ref 80.0–100.0)
Platelets: 315 K/uL (ref 150–400)
RBC: 3.18 MIL/uL — ABNORMAL LOW (ref 3.87–5.11)
RDW: 18.8 % — ABNORMAL HIGH (ref 11.5–15.5)
WBC: 8.9 K/uL (ref 4.0–10.5)
nRBC: 0.2 % (ref 0.0–0.2)

## 2023-10-03 LAB — MAGNESIUM: Magnesium: 1.6 mg/dL — ABNORMAL LOW (ref 1.7–2.4)

## 2023-10-03 LAB — GLUCOSE, CAPILLARY
Glucose-Capillary: 121 mg/dL — ABNORMAL HIGH (ref 70–99)
Glucose-Capillary: 143 mg/dL — ABNORMAL HIGH (ref 70–99)
Glucose-Capillary: 176 mg/dL — ABNORMAL HIGH (ref 70–99)

## 2023-10-03 LAB — HEPARIN LEVEL (UNFRACTIONATED): Heparin Unfractionated: <0.1 [IU]/mL — ABNORMAL LOW (ref 0.30–0.70)

## 2023-10-03 LAB — PHOSPHORUS: Phosphorus: 3.7 mg/dL (ref 2.5–4.6)

## 2023-10-03 MED ORDER — FERROUS SULFATE 325 (65 FE) MG PO TABS
325.0000 mg | ORAL_TABLET | ORAL | 1 refills | Status: AC
  Filled 2023-10-03: qty 15, 30d supply, fill #0

## 2023-10-03 MED ORDER — ACETAMINOPHEN ER 650 MG PO TBCR: 650.0000 mg | EXTENDED_RELEASE_TABLET | Freq: Four times a day (QID) | ORAL | Status: AC | PRN

## 2023-10-03 MED ORDER — CEFPODOXIME PROXETIL 200 MG PO TABS
200.0000 mg | ORAL_TABLET | Freq: Two times a day (BID) | ORAL | 0 refills | Status: AC
  Filled 2023-10-03: qty 10, 5d supply, fill #0

## 2023-10-03 MED ORDER — MAGNESIUM SULFATE 2 GM/50ML IV SOLN
2.0000 g | Freq: Once | INTRAVENOUS | Status: AC
  Administered 2023-10-03: 2 g via INTRAVENOUS
  Filled 2023-10-03: qty 50

## 2023-10-03 NOTE — Discharge Summary (Signed)
 Physician Discharge Summary  Gloria Sharp FMW:[id number] DOB: 1945/07/15 DOA: 09/28/2023  PCP: Gloria Hun, MD  Admit date: 09/28/2023 Discharge date: 10/03/2023  Time spent: 40 minutes  Recommendations for Outpatient Follow-up:  Follow outpatient CBC/CMP  Follow with cardiology outpatient for elevated troponin/wall motion abnormality noted during hospitalization Follow blood pressure outpatient - resuming entresto  gradually, 3 days after discharge (due to Gloria Sharp complaint of LH with standing this AM).  Planning to hold spironolactone  until she has outpatient follow up. Follow iron  deficiency outpatient  Cystic structure in popliteal fossa  Discharge Diagnoses:  Principal Problem:   Acute hypoxic respiratory failure [facility])   Discharge Condition: stable  Diet recommendation: heart healthy, diabetic  Filed Weights   09/29/23 0030 09/29/23 0453 09/30/23 0406  Weight: 104.2 kg 104.2 kg 106 kg    History of present illness:   78 year old woman who presented to [facility] 9/13 for abdominal pain. PMHx significant for HTN, CAD s/p PCI (LCx, DES to distal RCA on DAPT), moderate-to-severe AS (under consideration for TAVR), CVA, anemia, T2DM, fibromyalgia, admitted with septic shock secondary to acute pyelonephritis, intubated for ARF with hypoxia with subsequent extubation on 9/14.  She was transferred to [facility] on 9/16. She's improved on antibiotics for her UTI.  The case was discussed with cardiology in the setting of her elevated troponin and new wall motion abnormality who recommended outpatient follow up.    Stable for discharge 9/18 with outpatient follow up.  See below and previous notes for additional details  Hospital Course:  Assessment and Plan:  Septic Shock due to Pyelonephritis  sepsis physiology resolved Will transition to cefpodoxime  at discharge Urine culture with >100,000 CFU e. Coli, sensitive to ceftriaxone  Blood cx NGTD CT with extensive scattered patchy ground glass and  airspace opacities throughout both lungs, small bilateral effusions  Acute Hypoxic Respiratory Failure  Extensive Scattered Patchy Ground Glass and Airspace Opacities Throughout Both Lungs  Small Bilateral Effusions Would follow repeat imaging outpatient Possible aspiration pneumonia vs overload (flash pulm edema)?  Now on RA.  Coronary Artery Disease s/p PCI to Lcx and DES to distal RCA June 2025 Elevated Troponin New Wall Motion Abnormalities Troponin peaked to 811.  Echo from 9/14 mentions low normal EF (decreased from recent prior echo) and apical septal hypokinesis.  Suspect troponin elevation represents demand ischemia in setting of shock.  Was on heparin  gtt, this has been discontinued.  Case was discussed with Dr. Ladona by Dr. Dino on 9/17.  I discussed case with Dr. Ladona again on 9/18 and he plans for cardiology follow up outpatient. Continue DAPT, statin   Severe AS and MR Being worked up for TAVR as an outpatient   Orthostasis  C/o LH with standing this AM - negative orthostatics Follow outpatient - plan to resume home BP meds gradually  Hypertension  Continue metoprolol   resume entresto  in 3 days if feeling better.   Hold spironolactone  until outpatient follow up.    Iron  Deficiency Anemia Relatively stable today Seems consistent with prior values Will send with iron  outpatient  Low normal B12 level, follow outpatient   Chronic Back Pain Fibromyalgia  Noted  Cystic Structure in Popliteal Fossa Follow outpatient      Procedures: LE US  RIGHT:      - No cystic structure found in the popliteal fossa.    LEFT:      - Gloria Sharp heterogenous cystic structure is found in the popliteal fossa (4.08 x  1.02 x 2.40 cm).   Echo IMPRESSIONS  1. Left ventricular ejection fraction, by estimation, is 50 to 55%. The  left ventricle has low normal function. The left ventricle demonstrates  regional wall motion abnormalities. Apical septal hypokinesis. The left   ventricular internal cavity size was  mildly dilated. There is mild left ventricular hypertrophy.   2. Right ventricular systolic function is normal. The right ventricular  size is normal.   3. The aortic valve is calcified. There is severe calcifcation of the  aortic valve. Aortic valve regurgitation is not visualized. Moderate to  severe aortic valve stenosis, though limited evaluation (gradients only  assessed on apical 5C view). Moderate  AS by gradients (Vmax 2.9 m/s, MG ) and DI (0.38), severe by AVA  (0.9cm^2).   Consultations: Cardiology (phone) PCCM  Discharge Exam: Vitals:   10/03/23 0834 10/03/23 1206  BP: (!) 150/69 (!) 144/61  Pulse: 77 73  Resp: 18 15  Temp: 97.7 F (36.5 C) 98.3 F (36.8 C)  SpO2: 98% 97%   C/o feeling sluggish and LH this morning Feels better now  Husband at bedside  General: No acute distress. Cardiovascular: RRR Lungs: unlabored Abdomen: Soft, nontender, nondistended Neurological: Alert and oriented 3. Moves all extremities 4. Cranial nerves II through XII grossly intact. Extremities: No clubbing or cyanosis. No edema.  Discharge Instructions   Discharge Instructions     Call MD for:  difficulty breathing, headache or visual disturbances   Complete by: As directed    Call MD for:  extreme fatigue   Complete by: As directed    Call MD for:  hives   Complete by: As directed    Call MD for:  persistant dizziness or light-headedness   Complete by: As directed    Call MD for:  persistant nausea and vomiting   Complete by: As directed    Call MD for:  redness, tenderness, or signs of infection (pain, swelling, redness, odor or green/yellow discharge around incision site)   Complete by: As directed    Call MD for:  severe uncontrolled pain   Complete by: As directed    Call MD for:  temperature >100.4   Complete by: As directed    Diet - low sodium heart healthy   Complete by: As directed    Discharge instructions    Complete by: As directed    You were seen for sepsis from Gloria Sharp urinary tract infection (kidney infection).  You also had CT scan findings concerning for pneumonia.   You've improved with IV antibiotics.  We'll send you home with an additional 5 days of oral antibiotics.  You should have repeat chest imaging with your PCP outpatient.   You had elevated cardiac markers, I think this is related to the demand of your illness.  We discussed your case with cardiology.  You should follow up with them as an outpatient.  We'll plan to gradually resume your blood pressure/heart failure medicines.  Hold your entresto  until Sunday.  If you continue to feel better with no lightheadedness or dizziness, you can resume this medicine then.  Hold your spironolactone  until you follow up with your PCP or cardiology as an outpatient.  You had lightheadedness with standing today.  Changes positions slowly.  Your blood pressures were ok.  We'll resume your meds gradually as noted above.   Return for new, recurrent, or worsening symptoms.   Please ask your PCP to request records from this hospitalization so they know what was done and what the next steps will be.  Increase activity slowly   Complete by: As directed       Allergies as of 10/03/2023       Reactions   Cipro [ciprofloxacin Hcl] Hives, Itching, Rash   Flagyl [metronidazole] Itching, Swelling, Rash   Celebrex [celecoxib] Itching, Swelling, Other (See Comments)   Flushing, skin redness   Keflex  [cephalexin ] Itching   09/2018 note: tolerated Augmentin   Relafen [nabumetone] Itching, Swelling        Medication List     PAUSE taking these medications    sacubitril -valsartan  24-26 MG Wait to take this until: October 06, 2023 Resume if you're continuing to feel improved (hold if issues with lightheadedness or dizziness) Commonly known as: ENTRESTO  Take 1 tablet by mouth 2 (two) times daily.   spironolactone  25 MG tablet Wait to take this  until your doctor or other care provider tells you to start again. Commonly known as: ALDACTONE  Take 0.5 tablets (12.5 mg total) by mouth daily.       TAKE these medications    acetaminophen  650 MG CR tablet Commonly known as: TYLENOL  Take 1 tablet (650 mg total) by mouth every 6 (six) hours as needed for pain. What changed:  how much to take when to take this reasons to take this   aspirin  81 MG chewable tablet Chew 1 tablet (81 mg total) by mouth daily.   atorvastatin  80 MG tablet Commonly known as: LIPITOR  Take 1 tablet (80 mg total) by mouth daily.   brimonidine  0.15 % ophthalmic solution Commonly known as: ALPHAGAN  Place 1 drop into both eyes 2 (two) times daily.   cefpodoxime  200 MG tablet Commonly known as: VANTIN  Take 1 tablet (200 mg total) by mouth 2 (two) times daily for 5 days.   cholestyramine  4 g packet Commonly known as: Questran  Take 1 packet (4 g total) by mouth daily. Take at least 2 hours before or after rest of the medications What changed:  when to take this additional instructions   clopidogrel  75 MG tablet Commonly known as: PLAVIX  Take 1 tablet (75 mg total) by mouth daily.   diphenhydrAMINE  25 MG tablet Commonly known as: BENADRYL  Take 25 mg by mouth 2 (two) times daily as needed for allergies or itching.   FISH OIL PO Take 1 capsule by mouth every other day.   gabapentin  300 MG capsule Commonly known as: NEURONTIN  Take 300 mg by mouth 3 (three) times daily.   MAGNESIUM  PO Take 1 tablet by mouth at bedtime.   metFORMIN 1000 MG tablet Commonly known as: GLUCOPHAGE Take 1,000 mg by mouth 2 (two) times daily with Lolita Faulds meal.   metoprolol  succinate 25 MG 24 hr tablet Commonly known as: Toprol  XL Take 0.5 tablets (12.5 mg total) by mouth daily. What changed: when to take this   nitroGLYCERIN  0.4 MG SL tablet Commonly known as: NITROSTAT  Place 1 tablet (0.4 mg total) under the tongue every 5 (five) minutes as needed.   OVER THE  COUNTER MEDICATION Apply 5-10 patches topically daily as needed (back, hip, leg pain). OTC Coralite pain patch   OVER THE COUNTER MEDICATION Take 1-2 tablets by mouth See admin instructions. OTC Acumed pain relief supplement; Take 2 tablets by mouth in the morning and 1 tablet at night.   pantoprazole  40 MG tablet Commonly known as: PROTONIX  Take 1 tablet (40 mg total) by mouth 2 (two) times daily. What changed:  when to take this additional instructions   sertraline  50 MG tablet Commonly known as: ZOLOFT  Take 50  mg by mouth daily.   sucralfate  1 g tablet Commonly known as: CARAFATE  Take 1 tablet (1 g total) by mouth 3 (three) times daily.   TURMERIC PO Take 1 tablet by mouth 2 (two) times daily.   VITAMIN B-12 PO Take 1 tablet by mouth daily at 12 noon.       Allergies  Allergen Reactions   Cipro [Ciprofloxacin Hcl] Hives, Itching and Rash   Flagyl [Metronidazole] Itching, Swelling and Rash   Celebrex [Celecoxib] Itching, Swelling and Other (See Comments)    Flushing, skin redness   Keflex  [Cephalexin ] Itching    09/2018 note: tolerated Augmentin   Relafen [Nabumetone] Itching and Swelling      The results of significant diagnostics from this hospitalization (including imaging, microbiology, ancillary and laboratory) are listed below for reference.    Significant Diagnostic Studies: VAS US  LOWER EXTREMITY VENOUS (DVT) Result Date: 09/30/2023  Lower Venous DVT Study Patient Name:  Gloria Sharp Mo  Date of Exam:   09/29/2023 Medical Rec #: [id number]      Accession #:    [id number] Date of Birth: September 04, 1945      Patient Gender: F Patient Age:   78 years Exam Location:  [facility] Procedure:      VAS US  LOWER EXTREMITY VENOUS (DVT) Referring Phys: TORIBIO SHARPS --------------------------------------------------------------------------------  Indications: Edema.  Limitations: Poor ultrasound/tissue interface. Comparison Study: No previous exams Performing Technologist:  Jody Adelstein RVT, RDMS  Examination Guidelines: Colan Laymon complete evaluation includes B-mode imaging, spectral Doppler, color Doppler, and power Doppler as needed of all accessible portions of each vessel. Bilateral testing is considered an integral part of Darrnell Mangiaracina complete examination. Limited examinations for reoccurring indications may be performed as noted. The reflux portion of the exam is performed with the patient in reverse Trendelenburg.  +---------+---------------+---------+-----------+----------+--------------+ RIGHT    CompressibilityPhasicitySpontaneityPropertiesThrombus Aging +---------+---------------+---------+-----------+----------+--------------+ CFV      Full           Yes      Yes                                 +---------+---------------+---------+-----------+----------+--------------+ SFJ      Full                                                        +---------+---------------+---------+-----------+----------+--------------+ FV Prox  Full           Yes      Yes                                 +---------+---------------+---------+-----------+----------+--------------+ FV Mid   Full           Yes      Yes                                 +---------+---------------+---------+-----------+----------+--------------+ FV DistalFull           Yes      Yes                                 +---------+---------------+---------+-----------+----------+--------------+ PFV  Full                                                        +---------+---------------+---------+-----------+----------+--------------+ POP      Full           Yes      Yes                                 +---------+---------------+---------+-----------+----------+--------------+ PTV      Full                                                        +---------+---------------+---------+-----------+----------+--------------+ PERO     Full                                                         +---------+---------------+---------+-----------+----------+--------------+   +---------+---------------+---------+-----------+----------+--------------+ LEFT     CompressibilityPhasicitySpontaneityPropertiesThrombus Aging +---------+---------------+---------+-----------+----------+--------------+ CFV      Full           Yes      Yes                                 +---------+---------------+---------+-----------+----------+--------------+ SFJ      Full                                                        +---------+---------------+---------+-----------+----------+--------------+ FV Prox  Full           Yes      Yes                                 +---------+---------------+---------+-----------+----------+--------------+ FV Mid   Full           Yes      Yes                                 +---------+---------------+---------+-----------+----------+--------------+ FV DistalFull           Yes      Yes                                 +---------+---------------+---------+-----------+----------+--------------+ PFV      Full                                                        +---------+---------------+---------+-----------+----------+--------------+ POP  Full           Yes      Yes                                 +---------+---------------+---------+-----------+----------+--------------+ PTV      Full                                                        +---------+---------------+---------+-----------+----------+--------------+ PERO     Full                                                        +---------+---------------+---------+-----------+----------+--------------+     Summary: BILATERAL: - No evidence of deep vein thrombosis seen in the lower extremities, bilaterally. - RIGHT: - No cystic structure found in the popliteal fossa.  LEFT: - Ezrah Panning heterogenous cystic structure is found in the popliteal fossa (4.08 x 1.02 x 2.40 cm).   *See table(s) above for measurements and observations. Electronically signed by Debby Robertson on 09/30/2023 at 11:33:54 AM.    Final    ECHOCARDIOGRAM LIMITED Result Date: 09/29/2023    ECHOCARDIOGRAM LIMITED REPORT   Patient Name:   Gloria Sharp Cooperman Date of Exam: 09/29/2023 Medical Rec #:  [id number]     Height:       60.0 in Accession #:    [id number]    Weight:       229.7 lb Date of Birth:  05/17/1945     BSA:          1.980 m Patient Age:    78 years      BP:           100/48 mmHg Patient Gender: F             HR:           78 bpm. Exam Location:  Inpatient Procedure: Limited Echo, Cardiac Doppler, Color Doppler and Intracardiac            Opacification Agent (Both Spectral and Color Flow Doppler were            utilized during procedure). Indications:    NSTEMI I21.4  History:        Patient has prior history of Echocardiogram examinations, most                 recent 09/24/2023. Mitral Valve Disease and Aortic Valve Disease;                 Risk Factors:Hypertension and Diabetes.  Sonographer:    Jayson Gaskins Referring Phys: [id number] TORIBIO BROCKS SMITH IMPRESSIONS  1. Left ventricular ejection fraction, by estimation, is 50 to 55%. The left ventricle has low normal function. The left ventricle demonstrates regional wall motion abnormalities. Apical septal hypokinesis. The left ventricular internal cavity size was mildly dilated. There is mild left ventricular hypertrophy.  2. Right ventricular systolic function is normal. The right ventricular size is normal.  3. The aortic valve is calcified. There is severe calcifcation of the aortic valve. Aortic valve regurgitation is not visualized. Moderate to severe aortic valve stenosis,  though limited evaluation (gradients only assessed on apical 5C view). Moderate AS by gradients (Vmax 2.9 m/s, MG ) and DI (0.38), severe by AVA (0.9cm^2). FINDINGS  Left Ventricle: Left ventricular ejection fraction, by estimation, is 50 to 55%. The left ventricle has low normal  function. The left ventricle demonstrates regional wall motion abnormalities. Definity  contrast agent was given IV to delineate the left ventricular endocardial borders. The left ventricular internal cavity size was mildly dilated. There is mild left ventricular hypertrophy. Right Ventricle: The right ventricular size is normal. No increase in right ventricular wall thickness. Right ventricular systolic function is normal. Pericardium: There is no evidence of pericardial effusion. Tricuspid Valve: The tricuspid valve is normal in structure. Tricuspid valve regurgitation is mild. Aortic Valve: The aortic valve is calcified. There is severe calcifcation of the aortic valve. Aortic valve regurgitation is not visualized. Severe aortic stenosis is present. Aortic valve mean gradient measures 22.0 mmHg. Aortic valve peak gradient measures 32.5 mmHg. Aortic valve area, by VTI measures 0.86 cm. LEFT VENTRICLE PLAX 2D LVIDd:         5.70 cm LVIDs:         4.20 cm LV PW:         1.00 cm LV IVS:        1.00 cm LVOT diam:     1.70 cm LV SV:         50 LV SV Index:   25 LVOT Area:     2.27 cm  AORTIC VALVE AV Area (Vmax):    0.71 cm AV Area (Vmean):   0.67 cm AV Area (VTI):     0.86 cm AV Vmax:           285.00 cm/s AV Vmean:          218.000 cm/s AV VTI:            0.579 m AV Peak Grad:      32.5 mmHg AV Mean Grad:      22.0 mmHg LVOT Vmax:         88.60 cm/s LVOT Vmean:        64.600 cm/s LVOT VTI:          0.220 m LVOT/AV VTI ratio: 0.38  SHUNTS Systemic VTI:  0.22 m Systemic Diam: 1.70 cm Lonni Nanas MD Electronically signed by Lonni Nanas MD Signature Date/Time: 09/29/2023/1:16:55 PM    Final    CT ANGIO ABDOMEN PELVIS  W & WO CONTRAST Result Date: 09/29/2023 EXAM: CTA ABDOMEN AND PELVIS WITHOUT AND WITH CONTRAST 09/25/2023 11:14:39 AM TECHNIQUE: CTA images of the abdomen and pelvis without and with intravenous contrast. Three-dimensional MIP/volume rendered formations were performed. Automated  exposure control, iterative reconstruction, and/or weight based adjustment of the mA/kV was utilized to reduce the radiation dose to as low as reasonably achievable. COMPARISON: 08/28/2023 CLINICAL HISTORY: Aortic valve replacement (TAVR), pre-op eval. Cardiology to read, GRA to over read; 95ml omnipaque  350; TAVR protocol/aortic valve stenosis FINDINGS: VASCULATURE: AORTA: Minimal aortic plaque. No acute finding. No abdominal aortic aneurysm. No dissection. CELIAC TRUNK: Mild calcified plaque at the origin of the celiac axis resulting in mild short segmental stenosis, patent distally. SUPERIOR MESENTERIC ARTERY: No acute finding. No occlusion or significant stenosis. RENAL ARTERIES: Right renal artery calcified osteal plaque resulting in short segment moderate stenosis, patent distally. No acute finding in the left renal artery. No occlusion or significant stenosis. ILIAC ARTERIES: Left common iliac artery bifurcation mild calcified plaque. No acute finding. No occlusion  or significant stenosis. LIVER: The liver is unremarkable. GALLBLADDER AND BILE DUCTS: Cholecystectomy clips. No biliary ductal dilatation. SPLEEN: The spleen is unremarkable. PANCREAS: The pancreas is unremarkable. ADRENAL GLANDS: Bilateral adrenal glands demonstrate no acute abnormality. KIDNEYS, URETERS AND BLADDER: No stones in the kidneys or ureters. No hydronephrosis. No perinephric or periureteral stranding. Urinary bladder is unremarkable. GI AND BOWEL: Stomach and duodenal sweep demonstrate no acute abnormality. There is no bowel obstruction. No abnormal bowel wall thickening or distension. Scattered descending and sigmoid diverticula without adjacent inflammatory change. Surgical clip near the base of the cecum. REPRODUCTIVE: Post hysterectomy. PERITONEUM AND RETRPERITONEUM: No ascites or free air. LUNG BASE: No acute abnormality. LYMPH NODES: No lymphadenopathy. BONES AND SOFT TISSUES: Multilevel lumbar spondylotic change most marked  L3-4. Grade 1 anterolisthesis of L4-5 likely related to advanced Facet Djd. L5-S1 advanced bilateral Facet Djd. Mild lumbar levoscoliosis. No acute soft tissue abnormality. IMPRESSION: 1. calcified plaque at the origin of the celiac axis resulting in mild short segmental stenosis, patent distally. 2. Right renal artery calcified ostial plaque resulting in short segment moderate stenosis, patent distally. 3. Mild calcified plaque in the left common femoral artery and left common iliac artery bifurcation. Electronically signed by: Katheleen Faes MD 09/29/2023 11:24 AM EDT RP Workstation: [id number]   CT ANGIO CHEST AORTA W/CM & OR WO/CM Result Date: 09/29/2023 EXAM: CTA CHEST AORTA 09/25/2023 11:14:39 AM TECHNIQUE: CTA of the chest was performed after the administration of intravenous contrast. Multiplanar reformatted images are provided for review. MIP images are provided for review. Automated exposure control, iterative reconstruction, and/or weight based adjustment of the mA/kV was utilized to reduce the radiation dose to as low as reasonably achievable. COMPARISON: 08/30/2022 CLINICAL HISTORY: Aortic valve replacement (TAVR), pre-op eval. Cardiology to read, GRA to over read; TAVR protocol/aortic valve stenosis. FINDINGS: AORTA: No thoracic aortic dissection. No aneurysm. Noncalcified plaque in the aortic arch involving the left subclavian artery origin without high-grade stenosis. MEDIASTINUM: Mild cardiomegaly with predominantly left-sided enlargement. Coarse aortic valve leaflet calcifications. The heart and pericardium demonstrate no acute abnormality. LYMPH NODES: No mediastinal, hilar or axillary lymphadenopathy. LUNGS AND PLEURA: 3 mm right middle lobe nodule stable since 06/03/2020. No focal consolidation or pulmonary edema. No pleural effusion or pneumothorax. UPPER ABDOMEN: Limited images of the upper abdomen are unremarkable. SOFT TISSUES AND BONES: No acute bone or soft tissue abnormality. Mitral  annulus calcification. Moderate coronary calcification. IMPRESSION: 1. No acute abnormality of the aorta. 2. Mild cardiomegaly with predominantly left-sided enlargement and mitral annulus calcification. 3. Coarse aortic valve leaflet calcifications. Electronically signed by: Katheleen Faes MD 09/29/2023 11:11 AM EDT RP Workstation: [id number]   CT Angio Chest Pulmonary Embolism (PE) W or WO Contrast Result Date: 09/29/2023 EXAM: CTA of the Chest with contrast for PE 09/29/2023 10:41:19 AM TECHNIQUE: CTA of the chest was performed after the administration of intravenous contrast. Multiplanar reformatted images are provided for review. MIP images are provided for review. Automated exposure control, iterative reconstruction, and/or weight based adjustment of the mA/kV was utilized to reduce the radiation dose to as low as reasonably achievable. COMPARISON: 09/25/2023 CLINICAL HISTORY: Pulmonary embolism (PE) suspected, high prob. 75mL Omni 350 IV. Principal Problem: Acute hypoxic respiratory failure (HCC); Chief complaints; Abdominal Pain. FINDINGS: PULMONARY ARTERIES: Pulmonary arteries are adequately opacified for evaluation. No pulmonary embolism. Main pulmonary artery is normal in caliber. MEDIASTINUM: Mild 4-chamber cardiac enlargement. New small pericardial effusion. Heavy mitral annular calcification. Scattered coronary calcifications. Mild calcified plaque in the aortic arch. LYMPH NODES: No mediastinal,  hilar or axillary lymphadenopathy. LUNGS AND PLEURA: New extensive scattered patchy ground glass and airspace opacities throughout both lungs, left worse than right, with fairly dense consolidation posteriorly in the left lower lobe. Small bilateral pleural effusions, new since previous. UPPER ABDOMEN: Cholecystectomy clips. Mild calcified plaque in the proximal abdominal aorta. SOFT TISSUES AND BONES: No acute bone or soft tissue abnormality. IMPRESSION: 1. No pulmonary embolism. 2. New extensive scattered  patchy ground glass and airspace opacities throughout both lungs, left worse than right, with fairly dense consolidation posteriorly in the left lower lobe. 3. New small bilateral pleural effusions. 4. Mild 4-chamber cardiac enlargement and new small pericardial effusion. Electronically signed by: Katheleen Faes MD 09/29/2023 11:03 AM EDT RP Workstation: [id number]   DG Abdomen 1 View Result Date: 09/28/2023 CLINICAL DATA:  Check gastric catheter placement EXAM: ABDOMEN - 1 VIEW COMPARISON:  None Available. FINDINGS: Gastric catheter is noted within the stomach. No free air is seen. Patchy infiltrates are noted in the bases bilaterally. IMPRESSION: Gastric catheter within the stomach Electronically Signed   By: Oneil Devonshire M.D.   On: 09/28/2023 23:19   DG Chest Portable 1 View Result Date: 09/28/2023 EXAM: 1 VIEW XRAY OF THE CHEST 09/28/2023 09:53:06 PM COMPARISON: None available. CLINICAL HISTORY: g tube placement. FINDINGS: LINES, TUBES AND DEVICES: Endotracheal tube seen 4 cm above the carina. The tube is in appropriate position. LUNGS AND PLEURA: Interval development of extensive bilateral airspace infiltrate, asymmetrically more severe throughout the right lung, was suggestive of alveolar pulmonary edema or extensive pulmonary hemorrhage in the acute setting. No pneumothorax or pleural effusion. HEART AND MEDIASTINUM: Cardiac silhouette is largely obscured by extensive airspace infiltrate. BONES AND SOFT TISSUES: No acute osseous abnormality. IMPRESSION: 1. Interval development of extensive bilateral airspace infiltrate, asymmetrically more severe throughout the right lung, suggestive of alveolar pulmonary edema or extensive pulmonary hemorrhage in the acute setting. 2. No pneumothorax or pleural effusion. Electronically signed by: Dorethia Molt MD 09/28/2023 10:01 PM EDT RP Workstation: [id number]   CT ABDOMEN PELVIS W CONTRAST Result Date: 09/28/2023 CLINICAL DATA:  Right-sided abdominal pain EXAM:  CT ABDOMEN AND PELVIS WITH CONTRAST TECHNIQUE: Multidetector CT imaging of the abdomen and pelvis was performed using the standard protocol following bolus administration of intravenous contrast. RADIATION DOSE REDUCTION: This exam was performed according to the departmental dose-optimization program which includes automated exposure control, adjustment of the mA and/or kV according to patient size and/or use of iterative reconstruction technique. CONTRAST:  75mL OMNIPAQUE  IOHEXOL  350 MG/ML SOLN COMPARISON:  09/25/2023, 04/28/2023 FINDINGS: Lower chest: No acute abnormality. Hepatobiliary: No focal liver abnormality is seen. Status post cholecystectomy. No biliary dilatation. Pancreas: Unremarkable. No pancreatic ductal dilatation or surrounding inflammatory changes. Spleen: Normal in size without focal abnormality. Adrenals/Urinary Tract: Adrenal glands are within normal limits. Kidneys are well visualized without evidence of urinary calculi or obstructive change. Mild increased perinephric stranding is noted on right. Given the patient's clinical history this could represent Malique Driskill mild degree of UTI/pyelonephritis. Bladder is well distended. Stomach/Bowel: No obstructive or inflammatory changes of the colon are seen. Scattered diverticular changes noted without evidence of diverticulitis. The appendix is not visualized consistent with Latima Hamza prior surgical history. Small bowel and stomach are within normal limits. Vascular/Lymphatic: Aortic atherosclerosis. No enlarged abdominal or pelvic lymph nodes. Reproductive: Status post hysterectomy. No adnexal masses. Other: No abdominal wall hernia or abnormality. No abdominopelvic ascites. Musculoskeletal: No acute or significant osseous findings. Degenerative changes of lumbar spine are noted. IMPRESSION: Increased perinephric stranding surrounding the right  kidney without obstructive change or calculi. This may represent underlying pyelonephritis. Diverticulosis without  diverticulitis. Electronically Signed   By: Oneil Devonshire M.D.   On: 09/28/2023 20:32   DG Chest Portable 1 View Result Date: 09/28/2023 CLINICAL DATA:  Sepsis, chest and abdominal pain EXAM: PORTABLE CHEST 1 VIEW COMPARISON:  09/11/2023 FINDINGS: Single frontal view of the chest demonstrates an enlarged cardiac silhouette. No airspace disease, effusion, or pneumothorax. No acute bony abnormalities. IMPRESSION: 1. Stable enlarged cardiac silhouette. 2. No acute airspace disease. Electronically Signed   By: Ozell Daring M.D.   On: 09/28/2023 17:09   CT CORONARY MORPH W/CTA COR W/SCORE W/CA W/CM &/OR WO/CM Result Date: 09/25/2023 CLINICAL DATA:  Severe Aortic Stenosis. EXAM: Cardiac TAVR CT TECHNIQUE: Shrita Thien non-contrast, gated CT scan was obtained with axial slices of 2.5 mm through the heart for aortic valve scoring. Jaser Fullen 120 kV retrospective, gated, contrast cardiac scan was obtained. Gantry rotation speed was 230 msec and collimation was 0.63 mm. Nitroglycerin  was not given. The 3D dataset was reconstructed in systole with motion correction. The 3D data set was reconstructed in 5% intervals of the 0-95% of the R-R cycle. Systolic and diastolic phases were analyzed on Mishelle Hassan dedicated workstation using MPR, MIP, and VRT modes. The patient received 100 cc of contrast. FINDINGS: Image quality: Excellent. Noise artifact is: Limited. Valve Morphology: Tricuspid aortic valve with moderate to severe diffuse calcifications. Restricted leaflet motion in systole. Aortic Valve Calcium  score: 501 Aortic annular dimension: Phase assessed: 30% Annular area: 394 mm2 Annular perimeter: 71.3 mm Max diameter: 25.1 mm Min diameter: 20.9 mm Annular and subannular calcification: None. Membranous septum length: 6.1 mm Optimal coplanar projection: LAO 25 CRA 2 Coronary Artery Height above Annulus: Left Main: 14.1 mm Right Coronary: 16.8 mm Sinus of Valsalva Measurements: Non-coronary: 31 mm Right-coronary: 29 mm Left-coronary: 31 mm Sinus of  Valsalva Height: Non-coronary: 21.5 mm Right-coronary: 20.9 mm Left-coronary: 22.7 mm Sinotubular Junction: 25 mm Ascending Thoracic Aorta: 28 mm Coronary Arteries: Normal coronary origin. Right dominance. The study was performed without use of NTG and is insufficient for plaque evaluation. Please refer to recent cardiac catheterization for coronary assessment. Cardiac Morphology: Right Atrium: Right atrial size is within normal limits. Right Ventricle: The right ventricular cavity is within normal limits. Left Atrium: Left atrial size is normal in size with no left atrial appendage filling defect. Left Ventricle: The ventricular cavity size is within normal limits. Pulmonary arteries: Normal in size without proximal filling defect. Pulmonary veins: Normal pulmonary venous drainage. Pericardium: Normal thickness with no significant effusion or calcium  present. Mitral Valve: The mitral valve is degenerative with moderate annular calcium . Extra-cardiac findings: See attached radiology report for non-cardiac structures. IMPRESSION: 1. Annular measurements support Coletta Lockner 23 mm S3 or 26 mm Evolut Pro. 2. No significant annular or subannular calcifications. 3. Sufficient coronary to annulus distance. 4. Optimal Fluoroscopic Angle for Delivery: LAO 25 CRA 2 Accord T. Barbaraann, MD Electronically Signed   By: Darryle Barbaraann M.D.   On: 09/25/2023 18:53   ECHO TEE Result Date: 09/24/2023    TRANSESOPHOGEAL ECHO REPORT   Patient Name:   Gloria Sharp Leuthold Date of Exam: 09/24/2023 Medical Rec #:  [id number]     Height:       60.0 in Accession #:    [id number]    Weight:       225.3 lb Date of Birth:  [birth year]     BSA:          1.964 m Patient  Age:    78 years      BP:           122/47 mmHg Patient Gender: F             HR:           80 bpm. Exam Location:  Inpatient Procedure: 3D Echo, Transesophageal Echo, Color Doppler, Cardiac Doppler and            Saline Contrast Bubble Study (Both Spectral and Color Flow Doppler            were  utilized during procedure). 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 Aortic Valve Disease and Mitral Valve                  Disease; Risk Factors:Hypertension, Diabetes and Dyslipidemia.  Sonographer:     Koleen Popper RDCS Referring Phys:  [id number] SOYLA DELENA MERCK Diagnosing Phys: SOYLA MERCK MD PROCEDURE: After discussion of the risks and benefits of Hasson Gaspard TEE, an informed consent was obtained from the patient. The transesophogeal probe was passed without difficulty through the esophogus of the patient. Imaged were obtained with the patient in Maleyah Evans left lateral decubitus position. Sedation performed by different physician. The patient was monitored while under deep sedation. Anesthestetic sedation was provided intravenously by Anesthesiology: 290mg  of Propofol , 40mg  of Lidocaine . Image quality was good. The patient's vital signs; including heart rate, blood pressure, and oxygen  saturation; remained stable throughout the procedure. The patient developed no complications during the procedure.  IMPRESSIONS  1. Left ventricular ejection fraction, by estimation, is 60 to 65%. The left ventricle has normal function.  2. Right ventricular systolic function is normal. The right ventricular size is normal.  3. Left atrial size was mildly dilated. No left atrial/left atrial appendage thrombus was detected. The LAA emptying velocity was 43 cm/s.  4. Right atrial size was mildly dilated.  5. MVA by planimetry 2.5 cm2. The mitral valve is degenerative. Moderate mitral valve regurgitation, in many views and 3D it appears mild-moderate. Mild to moderate mitral stenosis. The mean mitral valve gradient is 6.5 mmHg with average heart rate of 80 bpm. Posterior leaflet is calcified at the valvular and subvalvular level, posterior leaflet length 7 mm.  6. The aortic valve is tricuspid. There is  severe calcifcation of the aortic valve. Aortic valve regurgitation is trivial. Moderate to severe likely low flow low gradient aortic valve stenosis. Aortic valve area, by VTI measures 0.70 cm. Aortic valve mean gradient measures 21.0 mmHg. Aortic valve Vmax measures 2.93 m/s. SVI 25, DVI 0.26.  7. Agitated saline contrast bubble study was negative, with no evidence of any interatrial shunt.  8. 3D performed of the mitral valve and demonstrates degenerative mitral valve with calcifications. FINDINGS  Left Ventricle: Left ventricular ejection fraction, by estimation, is 60 to 65%. The left ventricle has normal function. The left ventricular internal cavity size was normal in size. Right Ventricle: The right ventricular size is normal. No increase in right ventricular wall thickness. Right ventricular systolic function is normal. Left Atrium: Left atrial size was mildly dilated. No left atrial/left atrial appendage thrombus was detected. The LAA emptying velocity was 43 cm/s. Right Atrium: Right atrial size was mildly dilated. Pericardium: Trivial pericardial effusion is present. Mitral  Valve: MVA by planimetry 2.5 cm2. The mitral valve is degenerative in appearance. There is moderate calcification of the mitral valve leaflet(s). Moderate mitral valve regurgitation. Mild to moderate mitral valve stenosis. MV peak gradient, 11.4 mmHg. The mean mitral valve gradient is 6.5 mmHg with average heart rate of 80 bpm. Tricuspid Valve: The tricuspid valve is grossly normal. Tricuspid valve regurgitation is mild. Aortic Valve: The aortic valve is tricuspid. There is severe calcifcation of the aortic valve. Aortic valve regurgitation is trivial. Moderate to severe aortic stenosis is present. Aortic valve mean gradient measures 21.0 mmHg. Aortic valve peak gradient  measures 34.3 mmHg. Aortic valve area, by VTI measures 0.70 cm. Pulmonic Valve: The pulmonic valve was grossly normal. Pulmonic valve regurgitation is trivial.  Aorta: The aortic root and ascending aorta are structurally normal, with no evidence of dilitation. IAS/Shunts: No atrial level shunt detected by color flow Doppler. Agitated saline contrast was given intravenously to evaluate for intracardiac shunting. Agitated saline contrast bubble study was negative, with no evidence of any interatrial shunt. Additional Comments: 3D was performed not requiring image post processing on an independent workstation and was abnormal. Spectral Doppler performed. LEFT VENTRICLE PLAX 2D LVOT diam:     1.84 cm LV SV:         48 LV SV Index:   25 LVOT Area:     2.66 cm  AORTIC VALVE AV Area (Vmax):    0.73 cm AV Area (Vmean):   0.76 cm AV Area (VTI):     0.70 cm AV Vmax:           293.00 cm/s AV Vmean:          196.000 cm/s AV VTI:            0.685 m AV Peak Grad:      34.3 mmHg AV Mean Grad:      21.0 mmHg LVOT Vmax:         80.50 cm/s LVOT Vmean:        55.700 cm/s LVOT VTI:          0.181 m LVOT/AV VTI ratio: 0.26  AORTA Ao Root diam: 3.00 cm Ao Asc diam:  2.85 cm MITRAL VALVE MV Peak grad: 11.4 mmHg    SHUNTS MV Mean grad: 6.5 mmHg     Systemic VTI:  0.18 m MV Vmax:      1.69 m/s     Systemic Diam: 1.84 cm MV Vmean:     124.5 cm/s MR Peak grad: 104.8 mmHg MR Vmax:      511.75 cm/s MR Vmean:     385.3 cm/s MR PISA:      18.16 cm Soyla Merck MD Electronically signed by Soyla Merck MD Signature Date/Time: 09/24/2023/1:34:34 PM    Final    EP STUDY Result Date: 09/24/2023 See surgical note for result.  DG Chest 1 View Result Date: 09/11/2023 CLINICAL DATA:  Gastrointestinal bleeding. Ongoing weakness and shortness of breath. EXAM: CHEST  1 VIEW COMPARISON:  08/14/2023 FINDINGS: Stable enlarged cardiac silhouette. Decreased inspiration with clear lungs and normal vascularity. Interval mild-to-moderate peribronchial thickening. Interval faint nodular opacity in the right mid lung zone which appears to represent overlapping ribs and vessels. No acute bony abnormality.  IMPRESSION: 1. Interval mild to moderate bronchitic changes. 2. Stable cardiomegaly. 3. Interval faint nodular opacity in the right mid lung zone which appears to represent overlapping ribs and vessels. Recommend follow-up PA and lateral chest radiographs in 3-4 weeks. Electronically Signed  By: Elspeth Bathe M.D.   On: 09/11/2023 10:36   ECHOCARDIOGRAM COMPLETE Result Date: 09/04/2023    ECHOCARDIOGRAM REPORT   Patient Name:   Gloria Sharp Vanalstyne Date of Exam: 09/04/2023 Medical Rec #:  [id number]     Height:       60.0 in Accession #:    [id number]    Weight:       216.0 lb Date of Birth:  [birth year]     BSA:          1.929 m Patient Age:    78 years      BP:           126/81 mmHg Patient Gender: F             HR:           80 bpm. Exam Location:  [facility] Procedure: 2D Echo, Cardiac Doppler, Color Doppler and Strain Analysis (Both            Spectral and Color Flow Doppler were utilized during procedure). Indications:    Nonrheumatic aortic valve stenosis [I35.0 (ICD-10-CM)]  History:        Patient has prior history of Echocardiogram examinations, most                 recent 06/21/2023. Aortic Valve Disease and Mitral Valve Disease;                 Risk Factors:Diabetes and Dyslipidemia.  Sonographer:    Charlie Jointer RDCS Referring Phys: [phone] MICHAEL COOPER  Sonographer Comments: Image acquisition challenging due to patient body habitus. IMPRESSIONS  1. Left ventricular ejection fraction, by estimation, is 60 to 65%. The left ventricle has normal function. The left ventricle has no regional wall motion abnormalities. Left ventricular diastolic parameters are consistent with Grade I diastolic dysfunction (impaired relaxation). The average left ventricular global longitudinal strain is -9.6 %. The global longitudinal strain is abnormal.  2. Right ventricular systolic function is normal. The right ventricular size is normal. There is mildly elevated pulmonary artery systolic pressure.  3. Left atrial size was  moderately dilated.  4. The mitral valve is normal in structure. Severe mitral valve regurgitation. No evidence of mitral stenosis.  5. The aortic valve is normal in structure. Aortic valve regurgitation is not visualized. No aortic stenosis is present.  6. The inferior vena cava is normal in size with greater than 50% respiratory variability, suggesting right atrial pressure of 3 mmHg. FINDINGS  Left Ventricle: Left ventricular ejection fraction, by estimation, is 60 to 65%. The left ventricle has normal function. The left ventricle has no regional wall motion abnormalities. The average left ventricular global longitudinal strain is -9.6 %. Strain was performed and the global longitudinal strain is abnormal. The left ventricular internal cavity size was normal in size. There is no left ventricular hypertrophy. Left ventricular diastolic parameters are consistent with Grade I diastolic dysfunction (impaired relaxation). Right Ventricle: The right ventricular size is normal. No increase in right ventricular wall thickness. Right ventricular systolic function is normal. There is mildly elevated pulmonary artery systolic pressure. The tricuspid regurgitant velocity is 3.15  m/s, and with an assumed right atrial pressure of 3 mmHg, the estimated right ventricular systolic pressure is 42.7 mmHg. Left Atrium: Left atrial size was moderately dilated. Right Atrium: Right atrial size was normal in size. Pericardium: There is no evidence of pericardial effusion. Mitral Valve: The mitral valve is normal in structure. Severe mitral valve regurgitation. No evidence of  mitral valve stenosis. MV peak gradient, 12.7 mmHg. The mean mitral valve gradient is 7.0 mmHg. Tricuspid Valve: The tricuspid valve is normal in structure. Tricuspid valve regurgitation is not demonstrated. No evidence of tricuspid stenosis. Aortic Valve: The aortic valve is normal in structure. Aortic valve regurgitation is not visualized. No aortic stenosis is  present. Aortic valve mean gradient measures 25.2 mmHg. Aortic valve peak gradient measures 41.6 mmHg. Aortic valve area, by VTI measures 0.93 cm. Pulmonic Valve: The pulmonic valve was normal in structure. Pulmonic valve regurgitation is not visualized. No evidence of pulmonic stenosis. Aorta: The aortic root is normal in size and structure. Venous: The inferior vena cava is normal in size with greater than 50% respiratory variability, suggesting right atrial pressure of 3 mmHg. IAS/Shunts: No atrial level shunt detected by color flow Doppler.  LEFT VENTRICLE PLAX 2D LVIDd:         5.40 cm   Diastology LVIDs:         3.70 cm   LV e' medial:    5.33 cm/s LV PW:         1.00 cm   LV E/e' medial:  29.5 LV IVS:        1.40 cm   LV e' lateral:   7.62 cm/s LVOT diam:     2.00 cm   LV E/e' lateral: 20.6 LV SV:         66 LV SV Index:   34        2D Longitudinal Strain LVOT Area:     3.14 cm  2D Strain GLS Avg:     -9.6 %  RIGHT VENTRICLE             IVC RV Basal diam:  4.10 cm     IVC diam: 2.30 cm RV Mid diam:    3.40 cm RV S prime:     16.10 cm/s TAPSE (M-mode): 2.7 cm LEFT ATRIUM             Index        RIGHT ATRIUM           Index LA diam:        4.30 cm 2.23 cm/m   RA Area:     16.50 cm LA Vol (A2C):   63.3 ml 32.82 ml/m  RA Volume:   48.80 ml  25.30 ml/m LA Vol (A4C):   88.8 ml 46.04 ml/m LA Biplane Vol: 75.6 ml 39.20 ml/m  AORTIC VALVE AV Area (Vmax):    0.92 cm AV Area (Vmean):   0.87 cm AV Area (VTI):     0.93 cm AV Vmax:           322.60 cm/s AV Vmean:          236.200 cm/s AV VTI:            0.708 m AV Peak Grad:      41.6 mmHg AV Mean Grad:      25.2 mmHg LVOT Vmax:         94.77 cm/s LVOT Vmean:        65.200 cm/s LVOT VTI:          0.209 m LVOT/AV VTI ratio: 0.29  AORTA Ao Root diam: 3.00 cm Ao Asc diam:  2.90 cm Ao Desc diam: 2.50 cm MITRAL VALVE                  TRICUSPID VALVE MV Area (PHT): 3.63 cm  TR Peak grad:   39.7 mmHg MV Area VTI:   1.43 cm       TR Vmax:        315.00 cm/s MV  Peak grad:  12.7 mmHg MV Mean grad:  7.0 mmHg       SHUNTS MV Vmax:       1.78 m/s       Systemic VTI:  0.21 m MV Vmean:      124.0 cm/s     Systemic Diam: 2.00 cm MV Decel Time: 209 msec MR Peak grad:    130.0 mmHg MR Mean grad:    85.0 mmHg MR Vmax:         570.00 cm/s MR Vmean:        438.0 cm/s MR PISA:         3.53 cm MR PISA Eff ROA: 24 mm MR PISA Radius:  0.75 cm MV E velocity: 157.00 cm/s MV Tamanika Heiney velocity: 167.00 cm/s MV E/Cillian Gwinner ratio:  0.94 Jennifer Crape MD Electronically signed by Jennifer Crape MD Signature Date/Time: 09/04/2023/12:41:11 PM    Final     Microbiology: Recent Results (from the past 240 hours)  Urine Culture     Status: Abnormal   Collection Time: 09/28/23  4:29 PM   Specimen: Urine, Random  Result Value Ref Range Status   Specimen Description URINE, RANDOM  Final   Special Requests   Final    NONE Reflexed from [phone] Performed at [facility], [address] [place], [place] [address]    Culture >=100,000 COLONIES/mL ESCHERICHIA COLI (Sajad Glander)  Final   Report Status 09/30/2023 FINAL  Final   Organism ID, Bacteria ESCHERICHIA COLI (Kelcey Wickstrom)  Final      Susceptibility   Escherichia coli - MIC*    AMPICILLIN >=32 RESISTANT Resistant     CEFAZOLIN  (URINE) Value in next row Sensitive      4 SENSITIVEThis is Adolphe Fortunato modified FDA-approved test that has been validated and its performance characteristics determined by the reporting laboratory.  This laboratory is certified under the Clinical Laboratory Improvement Amendments CLIA as qualified to perform high complexity clinical laboratory testing.    CEFEPIME  Value in next row Sensitive      4 SENSITIVEThis is Lexis Potenza modified FDA-approved test that has been validated and its performance characteristics determined by the reporting laboratory.  This laboratory is certified under the Clinical Laboratory Improvement Amendments CLIA as qualified to perform high complexity clinical laboratory testing.    ERTAPENEM  Value in next row Sensitive      4  SENSITIVEThis is Deziya Amero modified FDA-approved test that has been validated and its performance characteristics determined by the reporting laboratory.  This laboratory is certified under the Clinical Laboratory Improvement Amendments CLIA as qualified to perform high complexity clinical laboratory testing.    CEFTRIAXONE  Value in next row Sensitive      4 SENSITIVEThis is Elyanah Farino modified FDA-approved test that has been validated and its performance characteristics determined by the reporting laboratory.  This laboratory is certified under the Clinical Laboratory Improvement Amendments CLIA as qualified to perform high complexity clinical laboratory testing.    CIPROFLOXACIN Value in next row Intermediate      4 SENSITIVEThis is Rakesha Dalporto modified FDA-approved test that has been validated and its performance characteristics determined by the reporting laboratory.  This laboratory is certified under the Clinical Laboratory Improvement Amendments CLIA as qualified to perform high complexity clinical laboratory testing.    GENTAMICIN Value in next row Sensitive  4 SENSITIVEThis is Dacie Mandel modified FDA-approved test that has been validated and its performance characteristics determined by the reporting laboratory.  This laboratory is certified under the Clinical Laboratory Improvement Amendments CLIA as qualified to perform high complexity clinical laboratory testing.    NITROFURANTOIN Value in next row Sensitive      4 SENSITIVEThis is Adra Shepler modified FDA-approved test that has been validated and its performance characteristics determined by the reporting laboratory.  This laboratory is certified under the Clinical Laboratory Improvement Amendments CLIA as qualified to perform high complexity clinical laboratory testing.    TRIMETH/SULFA Value in next row Sensitive      4 SENSITIVEThis is Zniyah Midkiff modified FDA-approved test that has been validated and its performance characteristics determined by the reporting laboratory.  This laboratory is  certified under the Clinical Laboratory Improvement Amendments CLIA as qualified to perform high complexity clinical laboratory testing.    AMPICILLIN/SULBACTAM Value in next row Intermediate      4 SENSITIVEThis is Valentino Saavedra modified FDA-approved test that has been validated and its performance characteristics determined by the reporting laboratory.  This laboratory is certified under the Clinical Laboratory Improvement Amendments CLIA as qualified to perform high complexity clinical laboratory testing.    PIP/TAZO Value in next row Sensitive ug/mL     <=4 SENSITIVEThis is Kriston Mckinnie modified FDA-approved test that has been validated and its performance characteristics determined by the reporting laboratory.  This laboratory is certified under the Clinical Laboratory Improvement Amendments CLIA as qualified to perform high complexity clinical laboratory testing.    MEROPENEM Value in next row Sensitive      <=4 SENSITIVEThis is Joetta Delprado modified FDA-approved test that has been validated and its performance characteristics determined by the reporting laboratory.  This laboratory is certified under the Clinical Laboratory Improvement Amendments CLIA as qualified to perform high complexity clinical laboratory testing.    * >=100,000 COLONIES/mL ESCHERICHIA COLI  Blood culture (routine x 2)     Status: None   Collection Time: 09/28/23  4:41 PM   Specimen: BLOOD LEFT ARM  Result Value Ref Range Status   Specimen Description BLOOD LEFT ARM  Final   Special Requests   Final    BOTTLES DRAWN AEROBIC AND ANAEROBIC Blood Culture adequate volume   Culture   Final    NO GROWTH 5 DAYS Performed at [facility], [address], [place], [place] [address]    Report Status 10/03/2023 FINAL  Final  Blood culture (routine x 2)     Status: None   Collection Time: 09/28/23  4:41 PM   Specimen: BLOOD LEFT HAND  Result Value Ref Range Status   Specimen Description BLOOD LEFT HAND  Final   Special Requests   Final    BOTTLES  DRAWN AEROBIC AND ANAEROBIC Blood Culture adequate volume   Culture   Final    NO GROWTH 5 DAYS Performed at [facility], [address] [place], [place] [address]    Report Status 10/03/2023 FINAL  Final  MRSA Next Gen by PCR, Nasal     Status: None   Collection Time: 09/30/23  8:50 AM   Specimen: Nasal Mucosa; Nasal Swab  Result Value Ref Range Status   MRSA by PCR Next Gen NOT DETECTED NOT DETECTED Final    Comment: (NOTE) The GeneXpert MRSA Assay (FDA approved for NASAL specimens only), is one component of Arnelle Nale comprehensive MRSA colonization surveillance program. It is not intended to diagnose MRSA infection nor to guide or monitor  treatment for MRSA infections. Test performance is not FDA approved in patients less than 87 years old. Performed at [facility], [address], [place], [place] [address]      Labs: Basic Metabolic Panel: Recent Labs  Lab 09/29/23 0154 09/29/23 0205 09/29/23 1207 09/30/23 1011 10/01/23 0528 10/02/23 0207 10/03/23 0511  NA 140   < > 134* 137 137 138 141  K 3.8   < > 3.9 4.0 3.7 3.6 3.8  CL 107  --  106 105 104 103 102  CO2 20*  --  20* 23 23 24 23   GLUCOSE 245*  --  199* 154* 118* 130* 113*  BUN 20  --  18 16 12 11 8   CREATININE 1.23*  --  1.23* 1.30* 1.03* 0.92 0.83  CALCIUM  8.3*  --  8.0* 7.9* 8.3* 8.4* 8.9  MG 1.5*  --  2.6* 1.8 1.6* 1.7 1.6*  PHOS 4.2  --   --  3.1 2.7 2.7 3.7   < > = values in this interval not displayed.   Liver Function Tests: Recent Labs  Lab 09/28/23 1641 09/28/23 2113 09/29/23 0154  AST 18 22 19   ALT 15 13 13   ALKPHOS 77 90 63  BILITOT 0.8 0.9 1.2  PROT 5.9* 6.1* 4.5*  ALBUMIN 3.4* 3.2* 2.8*   No results for input(s): LIPASE, AMYLASE in the last 168 hours. No results for input(s): AMMONIA in the last 168 hours. CBC: Recent Labs  Lab 09/28/23 1641 09/28/23 1647 09/28/23 2113 09/28/23 2132 09/29/23 1207 09/30/23 1011 10/01/23 0528 10/02/23 0207 10/03/23 0511  WBC 18.7*   --  29.1*   < > 31.2* 16.5* 11.4* 9.7 8.9  NEUTROABS 16.6*  --  25.9*  --   --   --   --   --   --   HGB 9.6*   < > 11.2*   < > 9.2* 8.3* 8.3* 7.9* 8.2*  HCT 31.9*   < > 38.6   < > 30.4* 27.3* 26.0* 25.4* 26.9*  MCV 86.4  --  89.6   < > 86.6 85.8 82.5 83.0 84.6  PLT 384  --  493*   < > 357 271 277 295 315   < > = values in this interval not displayed.   Cardiac Enzymes: No results for input(s): CKTOTAL, CKMB, CKMBINDEX, TROPONINI in the last 168 hours. BNP: BNP (last 3 results) Recent Labs    06/19/23 2101 08/14/23 1259 09/28/23 2113  BNP 95.4 222.8* 392.6*    ProBNP (last 3 results) Recent Labs    02/22/23 1630  PROBNP 410    CBG: Recent Labs  Lab 10/02/23 2144 10/02/23 2322 10/03/23 0358 10/03/23 0836 10/03/23 1205  GLUCAP 198* 178* 121* 143* 176*       Signed:  Meliton Monte MD.  [facility] 10/03/2023, 3:04 PM

## 2023-10-03 NOTE — Progress Notes (Signed)
 Patient discharged home with husband.  IV rocephine and IV magnesium  sulfate give prior to discharge.  IV and telemetry discontinued.  AVS discussed.  Patient alert and oriented denies pain or discomfortable upon discharge.

## 2023-10-03 NOTE — TOC Transition Note (Signed)
 Transition of Care [facility]) - Discharge Note   Patient Details  Name: Gloria Sharp MRN: [id number] Date of Birth: 1945-10-29  [facility]) CM/SW Contact:  Andrez JULIANNA George, RN Phone Number: 10/03/2023, 3:07 PM   Clinical Narrative:     Pt is discharging home with home health services through [facility]. Pt has used them in the past and asked to use them again.  Information on the AVS. [facility] will contact her for the first home visit.  Pt has needed DME at home.  Pt's spouse will transport her home and provide needed assistance.   Final next level of care: Home w Home Health Services Barriers to Discharge: No Barriers Identified   Patient Goals and CMS Choice   CMS Medicare.gov Compare Post Acute Care list provided to:: Patient Choice offered to / list presented to : Patient      Discharge Placement                       Discharge Plan and Services Additional resources added to the After Visit Summary for                            [facility] Arranged: PT, OT [facility] Agency: [facility] Date [facility] Agency Contacted: 10/03/23   Representative spoke with at [facility] Agency: [place]  Social Drivers of Health (SDOH) Interventions SDOH Screenings   Food Insecurity: No Food Insecurity (09/29/2023)  Housing: Low Risk  (09/29/2023)  Transportation Needs: No Transportation Needs (09/29/2023)  Utilities: Not At Risk (09/29/2023)  Social Connections: Patient Declined (09/29/2023)  Tobacco Use: Low Risk  (09/29/2023)     Readmission Risk Interventions    09/30/2023    2:51 PM 09/12/2023    1:44 PM  Readmission Risk Prevention Plan  Transportation Screening Complete Complete  PCP or Specialist Appt within 5-7 Days  Complete  Home Care Screening  Complete  Medication Review (RN CM)  Referral to Pharmacy  Medication Review (RN Care Manager) Referral to Pharmacy   HRI or Home Care Consult Complete   SW Recovery Care/Counseling Consult Complete   Palliative Care  Screening Not Applicable   Skilled Nursing Facility Not Applicable

## 2023-10-07 DIAGNOSIS — D649 Anemia, unspecified: Secondary | ICD-10-CM | POA: Diagnosis not present

## 2023-10-07 DIAGNOSIS — R931 Abnormal findings on diagnostic imaging of heart and coronary circulation: Secondary | ICD-10-CM | POA: Diagnosis not present

## 2023-10-07 DIAGNOSIS — J690 Pneumonitis due to inhalation of food and vomit: Secondary | ICD-10-CM | POA: Diagnosis not present

## 2023-10-07 DIAGNOSIS — I509 Heart failure, unspecified: Secondary | ICD-10-CM | POA: Diagnosis not present

## 2023-10-07 DIAGNOSIS — Z6841 Body Mass Index (BMI) 40.0 and over, adult: Secondary | ICD-10-CM | POA: Diagnosis not present

## 2023-10-07 DIAGNOSIS — Z789 Other specified health status: Secondary | ICD-10-CM | POA: Diagnosis not present

## 2023-10-09 ENCOUNTER — Encounter: Payer: Self-pay | Admitting: *Deleted

## 2023-10-11 ENCOUNTER — Encounter: Payer: Self-pay | Admitting: Cardiovascular Disease

## 2023-10-11 ENCOUNTER — Ambulatory Visit: Attending: Cardiovascular Disease | Admitting: Cardiovascular Disease

## 2023-10-11 VITALS — BP 130/60 | HR 74 | Ht 60.0 in | Wt 222.8 lb

## 2023-10-11 DIAGNOSIS — I350 Nonrheumatic aortic (valve) stenosis: Secondary | ICD-10-CM | POA: Diagnosis not present

## 2023-10-11 DIAGNOSIS — I340 Nonrheumatic mitral (valve) insufficiency: Secondary | ICD-10-CM

## 2023-10-11 NOTE — Patient Instructions (Signed)
 Medication Instructions:  No medication changes were made at this visit. Continue current regimen.   *If you need a refill on your cardiac medications before your next appointment, please call your pharmacy*  Lab Work: None ordered today. If you have labs (blood work) drawn today and your tests are completely normal, you will receive your results only by: MyChart Message (if you have MyChart) OR A paper copy in the mail If you have any lab test that is abnormal or we need to change your treatment, we will call you to review the results.  Testing/Procedures: Your physician has requested that you have an echocardiogram prior to 6 month follow-up with Dr. Wonda. Echocardiography is a painless test that uses sound waves to create images of your heart. It provides your doctor with information about the size and shape of your heart and how well your heart's chambers and valves are working. This procedure takes approximately one hour. There are no restrictions for this procedure. Please do NOT wear cologne, perfume, aftershave, or lotions (deodorant is allowed). Please arrive 15 minutes prior to your appointment time.  Please note: We ask at that you not bring children with you during ultrasound (echo/ vascular) testing. Due to room size and safety concerns, children are not allowed in the ultrasound rooms during exams. Our front office staff cannot provide observation of children in our lobby area while testing is being conducted. An adult accompanying a patient to their appointment will only be allowed in the ultrasound room at the discretion of the ultrasound technician under special circumstances. We apologize for any inconvenience.   Follow-Up: At [facility], you and your health needs are our priority.  As part of our continuing mission to provide you with exceptional heart care, our providers are all part of one team.  This team includes your primary Cardiologist (physician) and  Advanced Practice Providers or APPs (Physician Assistants and Nurse Practitioners) who all work together to provide you with the care you need, when you need it.  Your next appointment:   6 month(s)  Provider:   Ozell Wonda, MD

## 2023-10-11 NOTE — Assessment & Plan Note (Signed)
 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  I think that aortic stenosis might be contributing to her symptoms, but she has a multitude of problems with recent sepsis, morbid obesity, and anemia.  I reviewed all of this with the patient and her husband today.  I have recommended that she start physical therapy, keep working on weight loss and increased mobility, and follow-up with me in 6 months with an echocardiogram prior to her visit.  Her aortic valve calcium  score is low and her gradients are only in the low 20s.  In addition, with her obesity and anemia, both of those things typically would be expected to increase valvular gradients.  I do think she will have transfemoral access, although she understands that her body habitus places her at higher risk of vascular complications.  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  I think she should be followed closely and she understands to reach out and contact me if her symptoms progress in the interim.  Otherwise I will plan to see her back in 6 months.  I reviewed her CTA studies today, and she has good iliofemoral vessels for transfemoral access.  I would favor treating her ultimately with a Medtronic FX valve to get the largest valve in and possible for her.

## 2023-10-11 NOTE — Progress Notes (Signed)
 Cardiology Office Note:    Date:  10/11/2023   ID:  SHELLY SPENSER, DOB 1945-10-02, MRN [id number]  PCP:  Trinidad Hun, MD   [facility] Providers Cardiologist:  Lamar Fitch, MD     Referring MD: Trinidad Hun, MD   Chief Complaint  Patient presents with   Shortness of Breath    History of Present Illness:    Gloria Sharp is a 78 y.o. female presenting for follow-up of aortic stenosis.  The patient is here with her husband today.  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  She underwent further testing including CT angiography studies, transesophageal echo, and cardiac catheterization.  Her catheterization had been completed in June when she was shown to have a mean transaortic gradient of 22 mmHg and calculated aortic valve area of 1.14 cm.  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  LVEF is 65%.  TEE shows severe calcification of the aortic valve with moderate to severe low-flow low gradient aortic stenosis, mean gradient 21 mmHg dimensionless index 0.26.  Her CTA studies have demonstrated good transfemoral access for TAVR planning.  Her annular measurements would support a 23 mm S3 or 26 mm evolute valve.  Her aortic valve calcium  score is only 501.  The patient is here with her husband today.  She was just hospitalized with septic shock felt to be related to acute pyelonephritis.  Her high-sensitivity troponin peaked at 811 felt to be from demand ischemia.  She has felt weak since she has been back home.  She plans to start home physical therapy soon.  She is short of breath with physical activity.  She denies orthopnea, PND, or leg swelling.  She has had no chest pain or pressure.  Her primary complaints are weakness, fatigue, and exertional dyspnea.  She has been anemic with most recent hemoglobin 8.2.   Current Medications: Current Meds  Medication Sig   acetaminophen   (TYLENOL ) 650 MG CR tablet Take 1 tablet (650 mg total) by mouth every 6 (six) hours as needed for pain.   aspirin  81 MG chewable tablet Chew 1 tablet (81 mg total) by mouth daily.   atorvastatin  (LIPITOR ) 80 MG tablet Take 1 tablet (80 mg total) by mouth daily.   brimonidine  (ALPHAGAN ) 0.15 % ophthalmic solution Place 1 drop into both eyes 2 (two) times daily.   cholestyramine  (QUESTRAN ) 4 g packet Take 1 packet (4 g total) by mouth daily. Take at least 2 hours before or after rest of the medications   clopidogrel  (PLAVIX ) 75 MG tablet Take 1 tablet (75 mg total) by mouth daily.   Cyanocobalamin  (VITAMIN B-12 PO) Take 1 tablet by mouth daily at 12 noon.   diphenhydrAMINE  (BENADRYL ) 25 MG tablet Take 25 mg by mouth 2 (two) times daily as needed for allergies or itching.   ferrous sulfate  325 (65 FE) MG tablet Take 1 tablet (325 mg total) by mouth every other day. Follow repeat iron  studies and CBC outpatient with PCP.   gabapentin  (NEURONTIN ) 300 MG capsule Take 300 mg by mouth 3 (three) times daily.   MAGNESIUM  PO Take 1 tablet by mouth at bedtime.   metFORMIN (GLUCOPHAGE) 1000 MG tablet Take 1,000 mg by mouth 2 (two) times daily with a meal.   metoprolol  succinate (TOPROL  XL) 25 MG 24 hr tablet Take 0.5 tablets (12.5 mg total) by mouth daily.   nitroGLYCERIN  (NITROSTAT ) 0.4 MG SL  tablet Place 1 tablet (0.4 mg total) under the tongue every 5 (five) minutes as needed.   Omega-3 Fatty Acids (FISH OIL PO) Take 1 capsule by mouth every other day.   OVER THE COUNTER MEDICATION Apply 5-10 patches topically daily as needed (back, hip, leg pain). OTC Coralite pain patch   OVER THE COUNTER MEDICATION Take 1-2 tablets by mouth See admin instructions. OTC Acumed pain relief supplement; Take 2 tablets by mouth in the morning and 1 tablet at night.   pantoprazole  (PROTONIX ) 40 MG tablet Take 1 tablet (40 mg total) by mouth 2 (two) times daily.   sacubitril -valsartan  (ENTRESTO ) 24-26 MG Take 1 tablet by mouth  2 (two) times daily.   sertraline  (ZOLOFT ) 50 MG tablet Take 50 mg by mouth daily.   [Paused] spironolactone  (ALDACTONE ) 25 MG tablet Take 0.5 tablets (12.5 mg total) by mouth daily.   sucralfate  (CARAFATE ) 1 g tablet Take 1 tablet (1 g total) by mouth 3 (three) times daily.   TURMERIC PO Take 1 tablet by mouth 2 (two) times daily.     Allergies:   Cipro [ciprofloxacin hcl], Flagyl [metronidazole], Celebrex [celecoxib], Keflex  [cephalexin ], and Relafen [nabumetone]   ROS:   Please see the history of present illness.    All other systems reviewed and are negative.  EKGs/Labs/Other Studies Reviewed:    The following studies were reviewed today: Cardiac Studies & Procedures   ______________________________________________________________________________________________ CARDIAC CATHETERIZATION  CARDIAC CATHETERIZATION 06/26/2023  Conclusion Images from the original result were not included. Cardiac Catheterization 06/26/23: Hemodynamic data: Hemodynamic data: LV 172/5, EDP 18 mmHg.  Ao 176/55, mean 96 mmHg.  Peak to peak pressure gradient across the aortic valve at 21.7 with a mean gradient of 22.8 mmHg with calculated aortic valve area of 1.14 cm.  RA 12/10, mean 8 mmHg. RV 45/15, EDP 13 mmHg. PA 47/23, mean 32 mmHg PW 23/22, mean 18 mmHg. CO 6.82, CI 3.58 by Fick.  QP/QS 1.0.  PAPi 2.9.  Angiographic data: LM: Mild calcified but logical vessel. LAD: Large-caliber vessel, gives origin to small D1 and a moderate-sized D2, LAD has mid tandem 40% stenosis, ostial D2 40% stenosis and mid to distal LAD 40% tandem stenoses. LCx: Moderate caliber vessel with ostial 30% stenosis and after the origin of a small OM1 there is a focal 80% stenosis.  Large OM 2. RCA: Mildly diseased in the proximal segment.  Very large caliber vessel, large PDA and large PL branch with a focal mid 90% stenosis.  Intervention data: Successful score flex balloon angioplasty of mid CX with 2.5 x 10 mm balloon  stenosis reduced from 80% to 0%.  TIMI-3 to TIMI-3 flow. Successful stenting of the PL branch of RCA with implantation of a 3.0 x 16 mm Synergy XD DES, stenosis reduced from 90% to 0% with TIMI-3 to TIMI-3 flow.  Highly resilient lesion could not get adequate result with balloon angioplasty.    Impression and recommendations: Patient can be discharged home, she did receive 200 mL of contrast, adequate hydration at home will be recommended.  Will avoid any nephrotoxic agents.  Findings Coronary Findings Diagnostic  Dominance: Right  Left Main Vessel is angiographically normal.  Left Anterior Descending Mid LAD-1 lesion is 40% stenosed. The lesion is mildly calcified. Mid LAD-2 lesion is 60% stenosed. Dist LAD lesion is 40% stenosed.  Second Diagonal Branch 2nd Diag lesion is 40% stenosed.  Third Diagonal Branch Vessel is small in size. Vessel is angiographically normal.  Left Circumflex Ost Cx to Prox  Cx lesion is 30% stenosed. Mid Cx lesion is 80% stenosed. The lesion is type A.  Right Coronary Artery Vessel is large. Prox RCA-1 lesion is 30% stenosed. Prox RCA-2 lesion is 20% stenosed.  Acute Marginal Branch  Right Posterior Atrioventricular Artery RPAV lesion is 90% stenosed. The lesion is type B1 and concentric.  Intervention  Mid Cx lesion Angioplasty Lesion length:  8 mm. CATH VISTA GUIDE 6FR XB3.5 EPK guide catheter was inserted. WIRE RUNTHROUGH .[id number] guidewire used to cross lesion. Scoring balloon angioplasty was performed using a BALLOON SCOREFLEX 2.50X10. Maximum pressure: 16 atm. Inflation time: 60 sec. 3 inflations Post-Intervention Lesion Assessment The intervention was successful. Pre-interventional TIMI flow is 3. Post-intervention TIMI flow is 3. No complications occurred at this lesion. There is a 0% residual stenosis post intervention.  RPAV lesion Stent Lesion length:  14 mm. CATH LAUNCHER 24F JR4 guide catheter was inserted. Lesion crossed  with guidewire using a WIRE RUNTHROUGH .[id number]. A drug-eluting stent was successfully placed using a STENT SYNERGY XD 3.0X16. Maximum pressure: 16 atm. Inflation time: 60 sec. Stent strut is well apposed. Post-stent angioplasty was not performed. Stent deployed at 14 atmospheric pressure, for 1 minute followed by reinflation to 16 atmospheric pressure for 60 seconds. Post-Intervention Lesion Assessment The intervention was successful. Pre-interventional TIMI flow is 3. Post-intervention TIMI flow is 3. No complications occurred at this lesion. There is a 0% residual stenosis post intervention.   CARDIAC CATHETERIZATION  CARDIAC CATHETERIZATION 08/30/2022  Conclusion   Mid LAD lesion is 65% stenosed.   Dist Cx lesion is 65% stenosed.   Ost Cx to Prox Cx lesion is 30% stenosed.   Prox RCA-2 lesion is 20% stenosed.   Prox RCA-1 lesion is 30% stenosed.   2nd Diag lesion is 40% stenosed.   RPAV lesion is 80% stenosed.  1.  Unchanged moderate multivessel disease with no acute occlusions. 2.  Fick cardiac output of 7.8 L/min and Fick cardiac index of 4.1 L/min/m with thermodilution cardiac output of 6.3 L/min and thermodilution cardiac index of 3.3 L/min/m with the following hemodynamics: Right atrial pressure mean of 12 mmHg Right ventricular pressure 47/12 with an end-diastolic pressure of 19 mmHg Wedge pressure mean 24 mmHg PA pressure 54/28 with a mean of 40 mmHg Pulmonary vascular resistance of 2 Woods units 3.  LVEDP of 30 mmHg  Recommendation: Continue diuresis and goal-directed medical therapy for acute systolic heart failure.  Results reviewed with spouse.  Findings Coronary Findings Diagnostic  Dominance: Right  Left Main Vessel is angiographically normal.  Left Anterior Descending Mid LAD lesion is 65% stenosed. The lesion is mildly calcified.  Second Diagonal Branch 2nd Diag lesion is 40% stenosed.  Third Diagonal Branch Vessel is small in size. Vessel is  angiographically normal.  Left Circumflex Ost Cx to Prox Cx lesion is 30% stenosed. Dist Cx lesion is 65% stenosed.  Right Coronary Artery Vessel is large. Prox RCA-1 lesion is 30% stenosed. Prox RCA-2 lesion is 20% stenosed.  Right Posterior Atrioventricular Artery RPAV lesion is 80% stenosed.  Intervention  No interventions have been documented.   STRESS TESTS  MYOCARDIAL PERFUSION IMAGING 05/14/2022   ECHOCARDIOGRAM  ECHOCARDIOGRAM LIMITED 09/29/2023  Narrative ECHOCARDIOGRAM LIMITED REPORT    Patient Name:   Gloria Sharp Beazer Date of Exam: 09/29/2023 Medical Rec #:  [id number]     Height:       60.0 in Accession #:    [id number]    Weight:       229.7 lb Date  of Birth:  10/05/1945     BSA:          1.980 m Patient Age:    78 years      BP:           100/48 mmHg Patient Gender: F             HR:           78 bpm. Exam Location:  Inpatient  Procedure: Limited Echo, Cardiac Doppler, Color Doppler and Intracardiac Opacification Agent (Both Spectral and Color Flow Doppler were utilized during procedure).  Indications:    NSTEMI I21.4  History:        Patient has prior history of Echocardiogram examinations, most recent 09/24/2023. Mitral Valve Disease and Aortic Valve Disease; Risk Factors:Hypertension and Diabetes.  Sonographer:    Jayson Gaskins Referring Phys: [id number] TORIBIO BROCKS SMITH  IMPRESSIONS   1. Left ventricular ejection fraction, by estimation, is 50 to 55%. The left ventricle has low normal function. The left ventricle demonstrates regional wall motion abnormalities. Apical septal hypokinesis. The left ventricular internal cavity size was mildly dilated. There is mild left ventricular hypertrophy. 2. Right ventricular systolic function is normal. The right ventricular size is normal. 3. The aortic valve is calcified. There is severe calcifcation of the aortic valve. Aortic valve regurgitation is not visualized. Moderate to severe aortic valve stenosis, though  limited evaluation (gradients only assessed on apical 5C view). Moderate AS by gradients (Vmax 2.9 m/s, MG ) and DI (0.38), severe by AVA (0.9cm^2).  FINDINGS Left Ventricle: Left ventricular ejection fraction, by estimation, is 50 to 55%. The left ventricle has low normal function. The left ventricle demonstrates regional wall motion abnormalities. Definity  contrast agent was given IV to delineate the left ventricular endocardial borders. The left ventricular internal cavity size was mildly dilated. There is mild left ventricular hypertrophy.  Right Ventricle: The right ventricular size is normal. No increase in right ventricular wall thickness. Right ventricular systolic function is normal.  Pericardium: There is no evidence of pericardial effusion.  Tricuspid Valve: The tricuspid valve is normal in structure. Tricuspid valve regurgitation is mild.  Aortic Valve: The aortic valve is calcified. There is severe calcifcation of the aortic valve. Aortic valve regurgitation is not visualized. Severe aortic stenosis is present. Aortic valve mean gradient measures 22.0 mmHg. Aortic valve peak gradient measures 32.5 mmHg. Aortic valve area, by VTI measures 0.86 cm.  LEFT VENTRICLE PLAX 2D LVIDd:         5.70 cm LVIDs:         4.20 cm LV PW:         1.00 cm LV IVS:        1.00 cm LVOT diam:     1.70 cm LV SV:         50 LV SV Index:   25 LVOT Area:     2.27 cm   AORTIC VALVE AV Area (Vmax):    0.71 cm AV Area (Vmean):   0.67 cm AV Area (VTI):     0.86 cm AV Vmax:           285.00 cm/s AV Vmean:          218.000 cm/s AV VTI:            0.579 m AV Peak Grad:      32.5 mmHg AV Mean Grad:      22.0 mmHg LVOT Vmax:         88.60 cm/s  LVOT Vmean:        64.600 cm/s LVOT VTI:          0.220 m LVOT/AV VTI ratio: 0.38   SHUNTS Systemic VTI:  0.22 m Systemic Diam: 1.70 cm  Lonni Nanas MD Electronically signed by Lonni Nanas MD Signature Date/Time:  09/29/2023/1:16:55 PM    Final   TEE  ECHO TEE 09/24/2023  Narrative TRANSESOPHOGEAL ECHO REPORT    Patient Name:   EVI MCCOMB Gerardo Date of Exam: 09/24/2023 Medical Rec #:  [id number]     Height:       60.0 in Accession #:    [id number]    Weight:       225.3 lb Date of Birth:  27-Mar-1945     BSA:          1.964 m Patient Age:    78 years      BP:           122/47 mmHg Patient Gender: F             HR:           80 bpm. Exam Location:  Inpatient  Procedure: 3D Echo, Transesophageal Echo, Color Doppler, Cardiac Doppler and Saline Contrast Bubble Study (Both Spectral and Color Flow Doppler were utilized during procedure).  Indications:     Nonrheumatic mitral valve regurgitation I34.0  History:         Patient has prior history of Echocardiogram examinations, most recent 09/04/2023. Aortic Valve Disease and Mitral Valve Disease; Risk Factors:Hypertension, Diabetes and Dyslipidemia.  Sonographer:     Koleen Popper RDCS Referring Phys:  [id number] SOYLA DELENA MERCK Diagnosing Phys: SOYLA MERCK MD  PROCEDURE: After discussion of the risks and benefits of a TEE, an informed consent was obtained from the patient. The transesophogeal probe was passed without difficulty through the esophogus of the patient. Imaged were obtained with the patient in a left lateral decubitus position. Sedation performed by different physician. The patient was monitored while under deep sedation. Anesthestetic sedation was provided intravenously by Anesthesiology: 290mg  of Propofol , 40mg  of Lidocaine . Image quality was good. The patient's vital signs; including heart rate, blood pressure, and oxygen  saturation; remained stable throughout the procedure. The patient developed no complications during the procedure.  IMPRESSIONS   1. Left ventricular ejection fraction, by estimation, is 60 to 65%. The left ventricle has normal function. 2. Right ventricular systolic function is normal. The right ventricular size  is normal. 3. Left atrial size was mildly dilated. No left atrial/left atrial appendage thrombus was detected. The LAA emptying velocity was 43 cm/s. 4. Right atrial size was mildly dilated. 5. MVA by planimetry 2.5 cm2. The mitral valve is degenerative. Moderate mitral valve regurgitation, in many views and 3D it appears mild-moderate. Mild to moderate mitral stenosis. The mean mitral valve gradient is 6.5 mmHg with average heart rate of 80 bpm. Posterior leaflet is calcified at the valvular and subvalvular level, posterior leaflet length 7 mm. 6. The aortic valve is tricuspid. There is severe calcifcation of the aortic valve. Aortic valve regurgitation is trivial. Moderate to severe likely low flow low gradient aortic valve stenosis. Aortic valve area, by VTI measures 0.70 cm. Aortic valve mean gradient measures 21.0 mmHg. Aortic valve Vmax measures 2.93 m/s. SVI 25, DVI 0.26. 7. Agitated saline contrast bubble study was negative, with no evidence of any interatrial shunt. 8. 3D performed of the mitral valve and demonstrates degenerative mitral valve with calcifications.  FINDINGS Left Ventricle: Left ventricular  ejection fraction, by estimation, is 60 to 65%. The left ventricle has normal function. The left ventricular internal cavity size was normal in size.  Right Ventricle: The right ventricular size is normal. No increase in right ventricular wall thickness. Right ventricular systolic function is normal.  Left Atrium: Left atrial size was mildly dilated. No left atrial/left atrial appendage thrombus was detected. The LAA emptying velocity was 43 cm/s.  Right Atrium: Right atrial size was mildly dilated.  Pericardium: Trivial pericardial effusion is present.  Mitral Valve: MVA by planimetry 2.5 cm2. The mitral valve is degenerative in appearance. There is moderate calcification of the mitral valve leaflet(s). Moderate mitral valve regurgitation. Mild to moderate mitral valve stenosis. MV  peak gradient, 11.4 mmHg. The mean mitral valve gradient is 6.5 mmHg with average heart rate of 80 bpm.  Tricuspid Valve: The tricuspid valve is grossly normal. Tricuspid valve regurgitation is mild.  Aortic Valve: The aortic valve is tricuspid. There is severe calcifcation of the aortic valve. Aortic valve regurgitation is trivial. Moderate to severe aortic stenosis is present. Aortic valve mean gradient measures 21.0 mmHg. Aortic valve peak gradient measures 34.3 mmHg. Aortic valve area, by VTI measures 0.70 cm.  Pulmonic Valve: The pulmonic valve was grossly normal. Pulmonic valve regurgitation is trivial.  Aorta: The aortic root and ascending aorta are structurally normal, with no evidence of dilitation.  IAS/Shunts: No atrial level shunt detected by color flow Doppler. Agitated saline contrast was given intravenously to evaluate for intracardiac shunting. Agitated saline contrast bubble study was negative, with no evidence of any interatrial shunt.  Additional Comments: 3D was performed not requiring image post processing on an independent workstation and was abnormal. Spectral Doppler performed.  LEFT VENTRICLE PLAX 2D LVOT diam:     1.84 cm LV SV:         48 LV SV Index:   25 LVOT Area:     2.66 cm   AORTIC VALVE AV Area (Vmax):    0.73 cm AV Area (Vmean):   0.76 cm AV Area (VTI):     0.70 cm AV Vmax:           293.00 cm/s AV Vmean:          196.000 cm/s AV VTI:            0.685 m AV Peak Grad:      34.3 mmHg AV Mean Grad:      21.0 mmHg LVOT Vmax:         80.50 cm/s LVOT Vmean:        55.700 cm/s LVOT VTI:          0.181 m LVOT/AV VTI ratio: 0.26  AORTA Ao Root diam: 3.00 cm Ao Asc diam:  2.85 cm  MITRAL VALVE MV Peak grad: 11.4 mmHg    SHUNTS MV Mean grad: 6.5 mmHg     Systemic VTI:  0.18 m MV Vmax:      1.69 m/s     Systemic Diam: 1.84 cm MV Vmean:     124.5 cm/s MR Peak grad: 104.8 mmHg MR Vmax:      511.75 cm/s MR Vmean:     385.3 cm/s MR PISA:       18.16 cm  Soyla Merck MD Electronically signed by Soyla Merck MD Signature Date/Time: 09/24/2023/1:34:34 PM    Final  MONITORS  LONG TERM MONITOR (3-14 DAYS) 11/18/2018  Narrative BASIL BLAKESLEY, DOB 08-06-1945, MRN [id number]  HOLTER MONITOR REPORT:    Date  of test:                 10/18/2018 Duration of test:           13 days Indication:                    Palpitations Ordering physician:  Lamar JINNY Fitch, MD Referring physician:  Lamar JINNY Fitch, MD   Baseline rhythm: Sinus  Minimum heart rate: 58 BPM.  Average heart rate: 85 BPM.  Maximal heart rate 160 BPM.  Atrial arrhythmia: Infrequent PACs, 3 runs of narrow complex tachycardia fastest episode 7 beats at rate of 160.  Ventricular arrhythmia: 1 run of wide-complex tachycardia total of 8 beats at rate of 109.  Asymptomatic.  Conduction abnormality: None  Symptoms: Few triggered events but every single time rhythm was normal.   Conclusion: Nonsustained ventricular tachycardia 8 beats at rates of 109 bpm.  Asymptomatic.  Interpreting  cardiologist: Lamar Fitch, MD Date: 11/23/2018 7:01 PM   CT SCANS  CT CORONARY MORPH W/CTA COR W/SCORE 09/25/2023  Addendum 10/06/2023 10:35 AM ADDENDUM REPORT: 10/06/2023 10:32  EXAM: OVER-READ INTERPRETATION  CT CHEST  The following report is an over-read performed by radiologist Dr. Andrea Gasman of [facility] on 10/06/2023. This over-read does not include interpretation of cardiac or coronary anatomy or pathology. The coronary CTA interpretation by the cardiologist is attached.  COMPARISON:  Concurrent full field of view chest CTA  FINDINGS: Vascular: Aortic atherosclerosis. The included aorta is normal in caliber.  Mediastinum/nodes: No adenopathy or mass. Unremarkable esophagus.  Lungs: Please reference separately reported concurrent chest CT for full plated view assessment.  Upper abdomen: No acute findings.  Musculoskeletal:  There are no acute or suspicious osseous abnormalities. Thoracic spondylosis.  IMPRESSION: Aortic Atherosclerosis (ICD10-I70.0).   Electronically Signed By: Andrea Gasman M.D. On: 10/06/2023 10:32  Narrative CLINICAL DATA:  Severe Aortic Stenosis.  EXAM: Cardiac TAVR CT  TECHNIQUE: A non-contrast, gated CT scan was obtained with axial slices of 2.5 mm through the heart for aortic valve scoring. A 120 kV retrospective, gated, contrast cardiac scan was obtained. Gantry rotation speed was 230 msec and collimation was 0.63 mm. Nitroglycerin  was not given. The 3D dataset was reconstructed in systole with motion correction. The 3D data set was reconstructed in 5% intervals of the 0-95% of the R-R cycle. Systolic and diastolic phases were analyzed on a dedicated workstation using MPR, MIP, and VRT modes. The patient received 100 cc of contrast.  FINDINGS: Image quality: Excellent.  Noise artifact is: Limited.  Valve Morphology: Tricuspid aortic valve with moderate to severe diffuse calcifications. Restricted leaflet motion in systole.  Aortic Valve Calcium  score: 501  Aortic annular dimension:  Phase assessed: 30%  Annular area: 394 mm2  Annular perimeter: 71.3 mm  Max diameter: 25.1 mm  Min diameter: 20.9 mm  Annular and subannular calcification: None.  Membranous septum length: 6.1 mm  Optimal coplanar projection: LAO 25 CRA 2  Coronary Artery Height above Annulus:  Left Main: 14.1 mm  Right Coronary: 16.8 mm  Sinus of Valsalva Measurements:  Non-coronary: 31 mm  Right-coronary: 29 mm  Left-coronary: 31 mm  Sinus of Valsalva Height:  Non-coronary: 21.5 mm  Right-coronary: 20.9 mm  Left-coronary: 22.7 mm  Sinotubular Junction: 25 mm  Ascending Thoracic Aorta: 28 mm  Coronary Arteries: Normal coronary origin. Right dominance. The study was performed without use of NTG and is insufficient for plaque evaluation. Please refer to recent  cardiac catheterization  for coronary assessment.  Cardiac Morphology:  Right Atrium: Right atrial size is within normal limits.  Right Ventricle: The right ventricular cavity is within normal limits.  Left Atrium: Left atrial size is normal in size with no left atrial appendage filling defect.  Left Ventricle: The ventricular cavity size is within normal limits.  Pulmonary arteries: Normal in size without proximal filling defect.  Pulmonary veins: Normal pulmonary venous drainage.  Pericardium: Normal thickness with no significant effusion or calcium  present.  Mitral Valve: The mitral valve is degenerative with moderate annular calcium .  Extra-cardiac findings: See attached radiology report for non-cardiac structures.  IMPRESSION: 1. Annular measurements support a 23 mm S3 or 26 mm Evolut Pro.  2. No significant annular or subannular calcifications.  3. Sufficient coronary to annulus distance.  4. Optimal Fluoroscopic Angle for Delivery: LAO 25 CRA 2  Exeter T. Barbaraann, MD  Electronically Signed: By: Darryle Barbaraann M.D. On: 09/25/2023 18:53   CARDIAC MRI  MR CARDIAC MORPHOLOGY W WO CONTRAST 09/03/2022  Narrative CLINICAL DATA:  Cardiomyopathy of uncertain etiology  EXAM: CARDIAC MRI  TECHNIQUE: The patient was scanned on a 1.5 Tesla GE magnet. A dedicated cardiac coil was used. Functional imaging was done using Fiesta sequences. 2,3, and 4 chamber views were done to assess for RWMA's. Modified Simpson's rule using a short axis stack was used to calculate an ejection fraction on a dedicated work [employer] officer, [employer]. The patient received 8 cc of Gadavist . After 10 minutes inversion recovery sequences were used to assess for infiltration and scar tissue.  FINDINGS: Limited images of the lung fields showed no gross abnormalities.  Normal left ventricular size with mild LV hypertrophy. Septal-lateral dyssynchrony consistent with LBBB,  diffuse hypokinesis worse in the inferolateral wall with LV EF 36%. Normal right ventricular size with decreased systolic function, EF 37%. Normal left and right atrial sizes. Trileaflet aortic valve with thickening and moderate restriction, no significant regurgitation. By regurgitant fraction calculation, there was only trivial MR with regurgitant fraction 4%, but visually mitral regurgitation looked at least mild.  On delayed enhancement imaging, there was >50% wall thickness primarily subendocardial late gadolinium enhancement (LGE) in the basal to mid inferolateral wall.  MEASURMENTS: MEASURMENTS LVEDV 133 mL  LVEDVi 66 mL/m2  LVSV 48 mL  LVEF 36%  RVEDV 79 mL  RVEDVi 39 mL/m2  RVSV 29 mL RVEF 37%  Aortic forward volume 46 mL  Aortic regurgitant fraction 4%  T1 1133, ECV 25%  IMPRESSION: 1. Normal LV size with mild LV hypertrophy. Diffuse hypokinesis appearing worse in the inferolateral wall, septal-lateral dyssynchrony consistent with LBBB, LV EF calculated to be 36%.  2.  Normal RV size with EF 37%.  3. Thickened and trileaflet aortic valve. Visually moderate aortic stenosis. Would confirm with echo doppler.  4. Visually at least mild mitral regurgitation but regurgitant fraction calculation was only 4%.  5. Delayed enhancement showed subendocardial >50% wall thickness LGE in the basal to mid inferolateral wall. This is a coronary-type pattern and suggests possible prior MI involving LCx territory.  6.  Normal extracellular volume percentage.  Dalton Mclean   Electronically Signed By: Ezra Shuck M.D. On: 09/03/2022 13:37   ______________________________________________________________________________________________      EKG:        Recent Labs: 02/22/2023: NT-Pro BNP 410 09/28/2023: B Natriuretic Peptide 392.6 09/29/2023: ALT 13 10/03/2023: BUN 8; Creatinine, Ser 0.83; Hemoglobin 8.2; Magnesium  1.6; Platelets 315; Potassium 3.8; Sodium  141  Recent Lipid Panel    Component  Value Date/Time   CHOL 127 05/16/2022 0059   TRIG 189 (H) 09/03/2022 0428   HDL 38 (L) 05/16/2022 0059   CHOLHDL 3.3 05/16/2022 0059   VLDL 36 05/16/2022 0059   LDLCALC 53 05/16/2022 0059     Risk Assessment/Calculations:                Physical Exam:    VS:  BP 130/60   Pulse 74   Ht 5' (1.524 m)   Wt 222 lb 12.8 oz (101.1 kg)   SpO2 93%   BMI 43.51 kg/m     Wt Readings from Last 3 Encounters:  10/11/23 222 lb 12.8 oz (101.1 kg)  09/30/23 233 lb 11 oz (106 kg)  09/13/23 225 lb 5 oz (102.2 kg)     GEN:  Well nourished, well developed obese woman in no acute distress HEENT: Normal NECK: No JVD; No carotid bruits LYMPHATICS: No lymphadenopathy CARDIAC: RRR, 3/6 harsh crescendo decrescendo murmur at the right upper sternal border RESPIRATORY:  Clear to auscultation without rales, wheezing or rhonchi  ABDOMEN: Soft, non-tender, non-distended MUSCULOSKELETAL:  No edema; No deformity  SKIN: Warm and dry NEUROLOGIC:  Alert and oriented x 3 PSYCHIATRIC:  Normal affect   Assessment & Plan Nonrheumatic aortic valve stenosis The patient has 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  I think that aortic stenosis might be contributing to her symptoms, but she has a multitude of problems with recent sepsis, morbid obesity, and anemia.  I reviewed all of this with the patient and her husband today.  I have recommended that she start physical therapy, keep working on weight loss and increased mobility, and follow-up with me in 6 months with an echocardiogram prior to her visit.  Her aortic valve calcium  score is low and her gradients are only in the low 20s.  In addition, with her obesity and anemia, both of those things typically would be expected to increase valvular gradients.  I do think she will have transfemoral access, although she understands that her body habitus places her at higher risk of vascular complications.  I  suspect that she will require TAVR in the next 1 to 2 years depending on her clinical course.  I think she should be followed closely and she understands to reach out and contact me if her symptoms progress in the interim.  Otherwise I will plan to see her back in 6 months.  I reviewed her CTA studies today, and she has good iliofemoral vessels for transfemoral access.  I would favor treating her ultimately with a Medtronic FX valve to get the largest valve in and possible for her. Nonrheumatic mitral valve regurgitation Continue to follow, does not require intervention at this time.            Medication Adjustments/Labs and Tests Ordered: Current medicines are reviewed at length with the patient today.  Concerns regarding medicines are outlined above.  Orders Placed This Encounter  Procedures   ECHOCARDIOGRAM COMPLETE   No orders of the defined types were placed in this encounter.   Patient Instructions  Medication Instructions:  No medication changes were made at this visit. Continue current regimen.   *If you need a refill on your cardiac medications before your next appointment, please call your pharmacy*  Lab Work: None ordered today. If you have labs (blood work) drawn today and your tests are completely normal, you will receive your results only by: MyChart Message (if you have MyChart) OR A paper copy  in the mail If you have any lab test that is abnormal or we need to change your treatment, we will call you to review the results.  Testing/Procedures: Your physician has requested that you have an echocardiogram prior to 6 month follow-up with Dr. Wonda. Echocardiography is a painless test that uses sound waves to create images of your heart. It provides your doctor with information about the size and shape of your heart and how well your heart's chambers and valves are working. This procedure takes approximately one hour. There are no restrictions for this procedure. Please  do NOT wear cologne, perfume, aftershave, or lotions (deodorant is allowed). Please arrive 15 minutes prior to your appointment time.  Please note: We ask at that you not bring children with you during ultrasound (echo/ vascular) testing. Due to room size and safety concerns, children are not allowed in the ultrasound rooms during exams. Our front office staff cannot provide observation of children in our lobby area while testing is being conducted. An adult accompanying a patient to their appointment will only be allowed in the ultrasound room at the discretion of the ultrasound technician under special circumstances. We apologize for any inconvenience.   Follow-Up: At [facility], you and your health needs are our priority.  As part of our continuing mission to provide you with exceptional heart care, our providers are all part of one team.  This team includes your primary Cardiologist (physician) and Advanced Practice Providers or APPs (Physician Assistants and Nurse Practitioners) who all work together to provide you with the care you need, when you need it.  Your next appointment:   6 month(s)  Provider:   Ozell Wonda, MD      Signed, Ozell Wonda, MD  10/11/2023 1:20 PM    [facility]

## 2023-10-11 NOTE — Assessment & Plan Note (Signed)
 Continue to follow, does not require intervention at this time.

## 2023-10-14 ENCOUNTER — Encounter: Payer: Self-pay | Admitting: Cardiology

## 2023-10-15 DIAGNOSIS — D649 Anemia, unspecified: Secondary | ICD-10-CM | POA: Diagnosis not present

## 2023-10-15 DIAGNOSIS — I152 Hypertension secondary to endocrine disorders: Secondary | ICD-10-CM | POA: Diagnosis not present

## 2023-10-16 DIAGNOSIS — E1159 Type 2 diabetes mellitus with other circulatory complications: Secondary | ICD-10-CM | POA: Diagnosis not present

## 2023-10-16 DIAGNOSIS — I152 Hypertension secondary to endocrine disorders: Secondary | ICD-10-CM | POA: Diagnosis not present

## 2023-10-17 ENCOUNTER — Ambulatory Visit: Attending: Cardiology | Admitting: Cardiology

## 2023-10-17 ENCOUNTER — Encounter: Payer: Self-pay | Admitting: Cardiology

## 2023-10-17 VITALS — BP 130/62 | HR 90 | Ht 60.0 in | Wt 218.6 lb

## 2023-10-17 DIAGNOSIS — I10 Essential (primary) hypertension: Secondary | ICD-10-CM | POA: Diagnosis not present

## 2023-10-17 DIAGNOSIS — I350 Nonrheumatic aortic (valve) stenosis: Secondary | ICD-10-CM

## 2023-10-17 DIAGNOSIS — I25119 Atherosclerotic heart disease of native coronary artery with unspecified angina pectoris: Secondary | ICD-10-CM | POA: Diagnosis not present

## 2023-10-17 DIAGNOSIS — I5020 Unspecified systolic (congestive) heart failure: Secondary | ICD-10-CM

## 2023-10-17 NOTE — Patient Instructions (Signed)
Medication Instructions:  Your physician recommends that you continue on your current medications as directed. Please refer to the Current Medication list given to you today.  *If you need a refill on your cardiac medications before your next appointment, please call your pharmacy*   Lab Work: None Ordered If you have labs (blood work) drawn today and your tests are completely normal, you will receive your results only by: MyChart Message (if you have MyChart) OR A paper copy in the mail If you have any lab test that is abnormal or we need to change your treatment, we will call you to review the results.   Testing/Procedures: None Ordered   Follow-Up: At [facility], you and your health needs are our priority.  As part of our continuing mission to provide you with exceptional heart care, we have created designated Provider Care Teams.  These Care Teams include your primary Cardiologist (physician) and Advanced Practice Providers (APPs -  Physician Assistants and Nurse Practitioners) who all work together to provide you with the care you need, when you need it.  We recommend signing up for the patient portal called "MyChart".  Sign up information is provided on this After Visit Summary.  MyChart is used to connect with patients for Virtual Visits (Telemedicine).  Patients are able to view lab/test results, encounter notes, upcoming appointments, etc.  Non-urgent messages can be sent to your provider as well.   To learn more about what you can do with MyChart, go to [email or web address].    Your next appointment:   2 month(s)  The format for your next appointment:   In Person  Provider:   Gypsy Balsam, MD    Other Instructions NA

## 2023-10-17 NOTE — Progress Notes (Signed)
 Cardiology Office Note:    Date:  10/17/2023   ID:  Gloria Sharp, DOB September 28, 1945, MRN [id number]  PCP:  Trinidad Hun, MD  Cardiologist:  Lamar Fitch, MD    Referring MD: Trinidad Hun, MD   No chief complaint on file. I was in the hospital  History of Present Illness:    Gloria Sharp is a 78 y.o. female past medical history significant for coronary artery disease in 2020 December she did have cardiac catheterization which showed multiple borderline lesions, also have aortic stenosis which appears to be moderate to severe, recently she started having some chest pain which was difficult to assess if it is coming from arctic stenosis or borderline coronary artery disease previously and cardiac catheterization was redone again she was found to have significant lesion mid circumflex that was addressed with drug-eluting stent as well as PDA.  Since that time she was doing much better.  Last time I have seen her I recommended her to our structural team to talk about aortic stenosis, however before she had a chance to see them she ended up going to the hospital with urinary tract infection and urosepsis was sick actually required intubation at that time she was noted to have elevation of troponin, and then also decreased left ventricle ejection fraction.  After that she did see our structural team, excellently evaluated, feeling is that she is not yet at this stage of requiring any intervention of her aortic valve but careful follow-up was recommended.  She is scheduled to have repeat echocardiogram done. Comes today to my office feeling better still weak tired exhausted she does physical therapy and doing well with that  Past Medical History:  Diagnosis Date   AKI (acute kidney injury) 03/27/2021   Angina pectoris 05/15/2022   Aortic stenosis 09/04/2019   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 sciatica 02/12/2022   Class 3 obesity (HCC) 03/26/2021   CVA (cerebral vascular accident) (HCC)  2015?   Cystocele with uterine prolapse 01/17/2018   Diabetes (HCC)    Dizziness 03/27/2021   Dyslipidemia 10/10/2018   Essential hypertension 10/10/2018   Fibromyalgia    GERD (gastroesophageal reflux disease)    Heart murmur 10/10/2018   History of CVA (cerebrovascular accident) 10/10/2018   Hydronephrosis of right kidney 03/26/2021   Hypercholesterolemia    Hypertension    Hypokalemia 03/27/2021   Leukocytosis    Mitral regurgitation 11/24/2018   Nonsustained ventricular tachycardia (HCC) 11/24/2018   Post-operative nausea and vomiting    hard to wake up   Rectocele 01/17/2018   Sacroiliitis 02/12/2022   Sepsis secondary to UTI (HCC) 03/26/2021   Type 2 diabetes mellitus with hyperglycemia (HCC) 03/26/2021   Type 2 diabetes mellitus without complication, without long-term current use of insulin  (HCC) 10/10/2018    Past Surgical History:  Procedure Laterality Date   ABDOMINAL HYSTERECTOMY     partial, left the ovaries   APPENDECTOMY     BREAST CYST ASPIRATION Right    CHOLECYSTECTOMY     COLONOSCOPY  11/11/2013   Colonioc polyps status post polypectomy. Pancolonic diverticulosis predominately in the sigmoid colon   CORONARY BALLOON ANGIOPLASTY N/A 06/26/2023   Procedure: CORONARY BALLOON ANGIOPLASTY;  Surgeon: Ladona Heinz, MD;  Location: [facility];  Service: Cardiovascular;  Laterality: N/A;   CYSTOSCOPY W/ URETERAL STENT PLACEMENT Right 03/26/2021   Procedure: CYSTOSCOPY WITH RETROGRADE PYELOGRAM/URETERAL STENT PLACEMENT;  Surgeon: Selma Donnice JONELLE, MD;  Location: [facility];  Service: Urology;  Laterality: Right;  CYSTOSCOPY/URETEROSCOPY/HOLMIUM LASER/STENT PLACEMENT Right 04/17/2021   Procedure: CYSTOSCOPY/ RETROGRADE/URETEROSCOPY/HOLMIUM LASER/STENT PLACEMENT;  Surgeon: Selma Donnice SAUNDERS, MD;  Location: [facility];  Service: Urology;  Laterality: Right;  ONLY NEEDS 60 MIN   DILATION AND CURETTAGE OF UTERUS     ENTEROSCOPY N/A 09/12/2023   Procedure: ENTEROSCOPY;   Surgeon: Leigh Elspeth SQUIBB, MD;  Location: [facility];  Service: Gastroenterology;  Laterality: N/A;   GALLBLADDER SURGERY Right 07/2014   HERNIA REPAIR  02/20/2021   Umbilical   LEFT HEART CATH AND CORONARY ANGIOGRAPHY N/A 05/16/2022   Procedure: LEFT HEART CATH AND CORONARY ANGIOGRAPHY;  Surgeon: Darron Deatrice LABOR, MD;  Location: [facility];  Service: Cardiovascular;  Laterality: N/A;   RIGHT/LEFT HEART CATH AND CORONARY ANGIOGRAPHY N/A 08/30/2022   Procedure: RIGHT/LEFT HEART CATH AND CORONARY ANGIOGRAPHY;  Surgeon: Wendel Lurena POUR, MD;  Location: [facility];  Service: Cardiovascular;  Laterality: N/A;   RIGHT/LEFT HEART CATH AND CORONARY ANGIOGRAPHY N/A 06/26/2023   Procedure: RIGHT/LEFT HEART CATH AND CORONARY ANGIOGRAPHY;  Surgeon: Ladona Heinz, MD;  Location: [facility];  Service: Cardiovascular;  Laterality: N/A;   TEE WITHOUT CARDIOVERSION N/A 09/03/2022   Procedure: TRANSESOPHAGEAL ECHOCARDIOGRAM;  Surgeon: Gardenia Led, DO;  Location: [facility];  Service: Cardiovascular;  Laterality: N/A;   TRANSESOPHAGEAL ECHOCARDIOGRAM (CATH LAB) N/A 09/24/2023   Procedure: TRANSESOPHAGEAL ECHOCARDIOGRAM;  Surgeon: Loni Soyla LABOR, MD;  Location: [facility];  Service: Cardiovascular;  Laterality: N/A;   vaginal polyp removal      Current Medications: Current Meds  Medication Sig   acetaminophen  (TYLENOL ) 650 MG CR tablet Take 1 tablet (650 mg total) by mouth every 6 (six) hours as needed for pain.   aspirin  81 MG chewable tablet Chew 1 tablet (81 mg total) by mouth daily.   atorvastatin  (LIPITOR ) 80 MG tablet Take 1 tablet (80 mg total) by mouth daily.   brimonidine  (ALPHAGAN ) 0.15 % ophthalmic solution Place 1 drop into both eyes 2 (two) times daily.   cefpodoxime  (VANTIN ) 200 MG tablet Take 200 mg by mouth 2 (two) times daily.   clopidogrel  (PLAVIX ) 75 MG tablet Take 1 tablet (75 mg total) by mouth daily.   Cyanocobalamin  (VITAMIN B-12 PO)  Take 1 tablet by mouth daily at 12 noon.   EPINEPHrine 0.3 mg/0.3 mL IJ SOAJ injection Inject 0.3 mg into the muscle as needed for anaphylaxis.   ferrous sulfate  325 (65 FE) MG tablet Take 1 tablet (325 mg total) by mouth every other day. Follow repeat iron  studies and CBC outpatient with PCP.   gabapentin  (NEURONTIN ) 300 MG capsule Take 300 mg by mouth 3 (three) times daily.   HYDROcodone -acetaminophen  (NORCO/VICODIN) 5-325 MG tablet Take 1 tablet by mouth every 4 (four) hours as needed for moderate pain (pain score 4-6).   MAGNESIUM  PO Take 1 tablet by mouth at bedtime.   metFORMIN (GLUCOPHAGE) 1000 MG tablet Take 1,000 mg by mouth 2 (two) times daily with a meal.   metoprolol  succinate (TOPROL  XL) 25 MG 24 hr tablet Take 0.5 tablets (12.5 mg total) by mouth daily.   nitroGLYCERIN  (NITROSTAT ) 0.4 MG SL tablet Place 1 tablet (0.4 mg total) under the tongue every 5 (five) minutes as needed.   pantoprazole  (PROTONIX ) 40 MG tablet Take 1 tablet (40 mg total) by mouth 2 (two) times daily.   sacubitril -valsartan  (ENTRESTO ) 24-26 MG Take 1 tablet by mouth 2 (two) times daily.   sertraline  (ZOLOFT ) 50 MG tablet Take 50 mg by mouth daily.  spironolactone  (ALDACTONE ) 25 MG tablet Take 12.5 mg by mouth daily.   sucralfate  (CARAFATE ) 1 g tablet Take 1 tablet (1 g total) by mouth 3 (three) times daily.     Allergies:   Cipro [ciprofloxacin hcl], Flagyl [metronidazole], Celebrex [celecoxib], Keflex  [cephalexin ], Nsaids, and Relafen [nabumetone]   Social History   Socioeconomic History   Marital status: Married    Spouse name: Not on file   Number of children: Not on file   Years of education: Not on file   Highest education level: Not on file  Occupational History   Not on file  Tobacco Use   Smoking status: Never   Smokeless tobacco: Never  Vaping Use   Vaping status: Never Used  Substance and Sexual Activity   Alcohol  use: Not Currently   Drug use: Never   Sexual activity: Not on file   Other Topics Concern   Not on file  Social History Narrative   Not on file   Social Drivers of Health   Financial Resource Strain: Not on file  Food Insecurity: No Food Insecurity (09/29/2023)   Hunger Vital Sign    Worried About Running Out of Food in the Last Year: Never true    Ran Out of Food in the Last Year: Never true  Transportation Needs: No Transportation Needs (09/29/2023)   PRAPARE - Administrator, Civil Service (Medical): No    Lack of Transportation (Non-Medical): No  Physical Activity: Not on file  Stress: Not on file  Social Connections: Patient Declined (09/29/2023)   Social Connection and Isolation Panel    Frequency of Communication with Friends and Family: Patient declined    Frequency of Social Gatherings with Friends and Family: Patient declined    Attends Religious Services: Patient declined    Database administrator or Organizations: Patient declined    Attends Engineer, structural: Patient declined    Marital Status: Patient declined     Family History: The patient's family history includes Cancer in her mother; Diabetes in her mother; Heart disease in her father; Heart failure in her mother; Hypertension in her father and mother; Stroke in her mother. There is no history of Colon cancer, Esophageal cancer, Stomach cancer, Rectal cancer, or Colon polyps. ROS:   Please see the history of present illness.    All 14 point review of systems negative except as described per history of present illness  EKGs/Labs/Other Studies Reviewed:         Recent Labs: 02/22/2023: NT-Pro BNP 410 09/28/2023: B Natriuretic Peptide 392.6 09/29/2023: ALT 13 10/03/2023: BUN 8; Creatinine, Ser 0.83; Hemoglobin 8.2; Magnesium  1.6; Platelets 315; Potassium 3.8; Sodium 141  Recent Lipid Panel    Component Value Date/Time   CHOL 127 05/16/2022 0059   TRIG 189 (H) 09/03/2022 0428   HDL 38 (L) 05/16/2022 0059   CHOLHDL 3.3 05/16/2022 0059   VLDL 36  05/16/2022 0059   LDLCALC 53 05/16/2022 0059    Physical Exam:    VS:  BP 130/62   Pulse 90   Ht 5' (1.524 m)   Wt 218 lb 9.6 oz (99.2 kg)   SpO2 96%   BMI 42.69 kg/m     Wt Readings from Last 3 Encounters:  10/17/23 218 lb 9.6 oz (99.2 kg)  10/07/23 225 lb (102.1 kg)  10/11/23 222 lb 12.8 oz (101.1 kg)     GEN:  Well nourished, well developed in no acute distress HEENT: Normal NECK: No JVD; No  carotid bruits LYMPHATICS: No lymphadenopathy CARDIAC: RRR, systolic ejection murmur grade 3/6 best heard at right border of sternum, no rubs, no gallops RESPIRATORY:  Clear to auscultation without rales, wheezing or rhonchi  ABDOMEN: Soft, non-tender, non-distended MUSCULOSKELETAL:  No edema; No deformity  SKIN: Warm and dry LOWER EXTREMITIES: no swelling NEUROLOGIC:  Alert and oriented x 3 PSYCHIATRIC:  Normal affect   ASSESSMENT:    1. Coronary artery disease with angina pectoris, unspecified vessel or lesion type, unspecified whether native or transplanted heart   2. Nonrheumatic aortic valve stenosis   3. HFrEF (heart failure with reduced ejection fraction) (HCC)   4. Primary hypertension    PLAN:    In order of problems listed above:  Coronary disease, she did have abnormal troponin while in the hospital but I suspect this is acute coronary syndrome type II, she was septic.  Recent cardiac catheterization reviewed.  For now we will continue present management which include dual antiplatelet therapy. Aortic stenosis evaluated recently by structural team, felt not to be critical enough to intervene at this stage, History of cardiomyopathy while in the hospital sick with sepsis.  Echocardiogram scheduled to be repeated to recheck left ventricle ejection fraction. Essential hypertension well-controlled continue present management.   Medication Adjustments/Labs and Tests Ordered: Current medicines are reviewed at length with the patient today.  Concerns regarding medicines  are outlined above.  No orders of the defined types were placed in this encounter.  Medication changes: No orders of the defined types were placed in this encounter.   Signed, Lamar DOROTHA Fitch, MD, [facility] 10/17/2023 9:45 AM    [facility]

## 2023-10-28 DIAGNOSIS — D649 Anemia, unspecified: Secondary | ICD-10-CM | POA: Diagnosis not present

## 2023-10-28 DIAGNOSIS — Z6841 Body Mass Index (BMI) 40.0 and over, adult: Secondary | ICD-10-CM | POA: Diagnosis not present

## 2023-10-28 DIAGNOSIS — Z23 Encounter for immunization: Secondary | ICD-10-CM | POA: Diagnosis not present

## 2023-11-07 DIAGNOSIS — E119 Type 2 diabetes mellitus without complications: Secondary | ICD-10-CM | POA: Diagnosis not present

## 2023-11-07 DIAGNOSIS — H401131 Primary open-angle glaucoma, bilateral, mild stage: Secondary | ICD-10-CM | POA: Diagnosis not present

## 2023-11-12 DIAGNOSIS — Z6841 Body Mass Index (BMI) 40.0 and over, adult: Secondary | ICD-10-CM | POA: Diagnosis not present

## 2023-11-12 DIAGNOSIS — R10A1 Flank pain, right side: Secondary | ICD-10-CM | POA: Diagnosis not present

## 2023-11-12 DIAGNOSIS — M1711 Unilateral primary osteoarthritis, right knee: Secondary | ICD-10-CM | POA: Diagnosis not present

## 2023-11-12 DIAGNOSIS — E1140 Type 2 diabetes mellitus with diabetic neuropathy, unspecified: Secondary | ICD-10-CM | POA: Diagnosis not present

## 2023-11-15 DIAGNOSIS — I152 Hypertension secondary to endocrine disorders: Secondary | ICD-10-CM | POA: Diagnosis not present

## 2023-11-16 DIAGNOSIS — I152 Hypertension secondary to endocrine disorders: Secondary | ICD-10-CM | POA: Diagnosis not present

## 2023-11-16 DIAGNOSIS — E1159 Type 2 diabetes mellitus with other circulatory complications: Secondary | ICD-10-CM | POA: Diagnosis not present

## 2023-11-20 DIAGNOSIS — M1711 Unilateral primary osteoarthritis, right knee: Secondary | ICD-10-CM | POA: Diagnosis not present

## 2023-11-20 DIAGNOSIS — Z6841 Body Mass Index (BMI) 40.0 and over, adult: Secondary | ICD-10-CM | POA: Diagnosis not present

## 2023-11-20 DIAGNOSIS — N1831 Chronic kidney disease, stage 3a: Secondary | ICD-10-CM | POA: Diagnosis not present

## 2023-11-20 DIAGNOSIS — I152 Hypertension secondary to endocrine disorders: Secondary | ICD-10-CM | POA: Diagnosis not present

## 2023-11-20 DIAGNOSIS — E1159 Type 2 diabetes mellitus with other circulatory complications: Secondary | ICD-10-CM | POA: Diagnosis not present

## 2023-12-02 ENCOUNTER — Other Ambulatory Visit: Payer: Self-pay

## 2023-12-02 DIAGNOSIS — I350 Nonrheumatic aortic (valve) stenosis: Secondary | ICD-10-CM

## 2023-12-14 ENCOUNTER — Other Ambulatory Visit: Payer: Self-pay | Admitting: Cardiology

## 2023-12-15 DIAGNOSIS — I152 Hypertension secondary to endocrine disorders: Secondary | ICD-10-CM | POA: Diagnosis not present

## 2023-12-16 DIAGNOSIS — I152 Hypertension secondary to endocrine disorders: Secondary | ICD-10-CM | POA: Diagnosis not present

## 2023-12-16 DIAGNOSIS — E1159 Type 2 diabetes mellitus with other circulatory complications: Secondary | ICD-10-CM | POA: Diagnosis not present

## 2023-12-20 ENCOUNTER — Ambulatory Visit: Attending: Cardiology | Admitting: Cardiology

## 2024-02-18 ENCOUNTER — Other Ambulatory Visit (HOSPITAL_COMMUNITY)

## 2024-03-20 ENCOUNTER — Ambulatory Visit: Admitting: Cardiovascular Disease

## 2024-03-20 ENCOUNTER — Ambulatory Visit (HOSPITAL_COMMUNITY): Admission: RE | Admit: 2024-03-20 | Source: Ambulatory Visit
